# Patient Record
Sex: Male | Born: 1960 | Race: White | Hispanic: No | Marital: Single | Smoking: Never smoker
Health system: Southern US, Community
[De-identification: ages and names within clinical notes are randomized; demographics above are authoritative.]

## PROBLEM LIST (undated history)

## (undated) DIAGNOSIS — I10 Essential (primary) hypertension: Secondary | ICD-10-CM

## (undated) DIAGNOSIS — Z0389 Encounter for observation for other suspected diseases and conditions ruled out: Secondary | ICD-10-CM

## (undated) DIAGNOSIS — E785 Hyperlipidemia, unspecified: Secondary | ICD-10-CM

## (undated) DIAGNOSIS — I340 Nonrheumatic mitral (valve) insufficiency: Secondary | ICD-10-CM

## (undated) HISTORY — DX: Hyperlipidemia, unspecified: E78.5

## (undated) HISTORY — DX: Nonrheumatic mitral (valve) insufficiency: I34.0

## (undated) HISTORY — DX: Encounter for observation for other suspected diseases and conditions ruled out: Z03.89

## (undated) HISTORY — DX: Essential (primary) hypertension: I10

---
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
## 1998-11-05 ENCOUNTER — Emergency Department (HOSPITAL_COMMUNITY): Admission: EM | Admit: 1998-11-05 | Discharge: 1998-11-05 | Payer: Self-pay | Admitting: Emergency Medicine

## 1998-11-05 ENCOUNTER — Encounter: Payer: Self-pay | Admitting: Emergency Medicine

## 2001-08-30 ENCOUNTER — Emergency Department (HOSPITAL_COMMUNITY): Admission: EM | Admit: 2001-08-30 | Discharge: 2001-08-30 | Payer: Self-pay

## 2007-10-04 HISTORY — PX: HERNIA REPAIR: SHX51

## 2008-02-28 ENCOUNTER — Ambulatory Visit (HOSPITAL_COMMUNITY): Admission: RE | Admit: 2008-02-28 | Discharge: 2008-02-28 | Payer: Self-pay | Admitting: General Surgery

## 2008-02-28 ENCOUNTER — Encounter (INDEPENDENT_AMBULATORY_CARE_PROVIDER_SITE_OTHER): Payer: Self-pay | Admitting: General Surgery

## 2008-09-04 ENCOUNTER — Ambulatory Visit (HOSPITAL_BASED_OUTPATIENT_CLINIC_OR_DEPARTMENT_OTHER): Admission: RE | Admit: 2008-09-04 | Discharge: 2008-09-04 | Payer: Self-pay | Admitting: Family Medicine

## 2008-09-07 ENCOUNTER — Ambulatory Visit: Payer: Self-pay | Admitting: Internal Medicine

## 2009-04-19 IMAGING — CR DG CHEST 2V
2 series · 2 of 2 positions shown · non-contrast
Comparison: None

CLINICAL DATA: Preoperative chest x-ray for right inguinal hernia
repair

CHEST - 2 VIEW

[w chest pa]
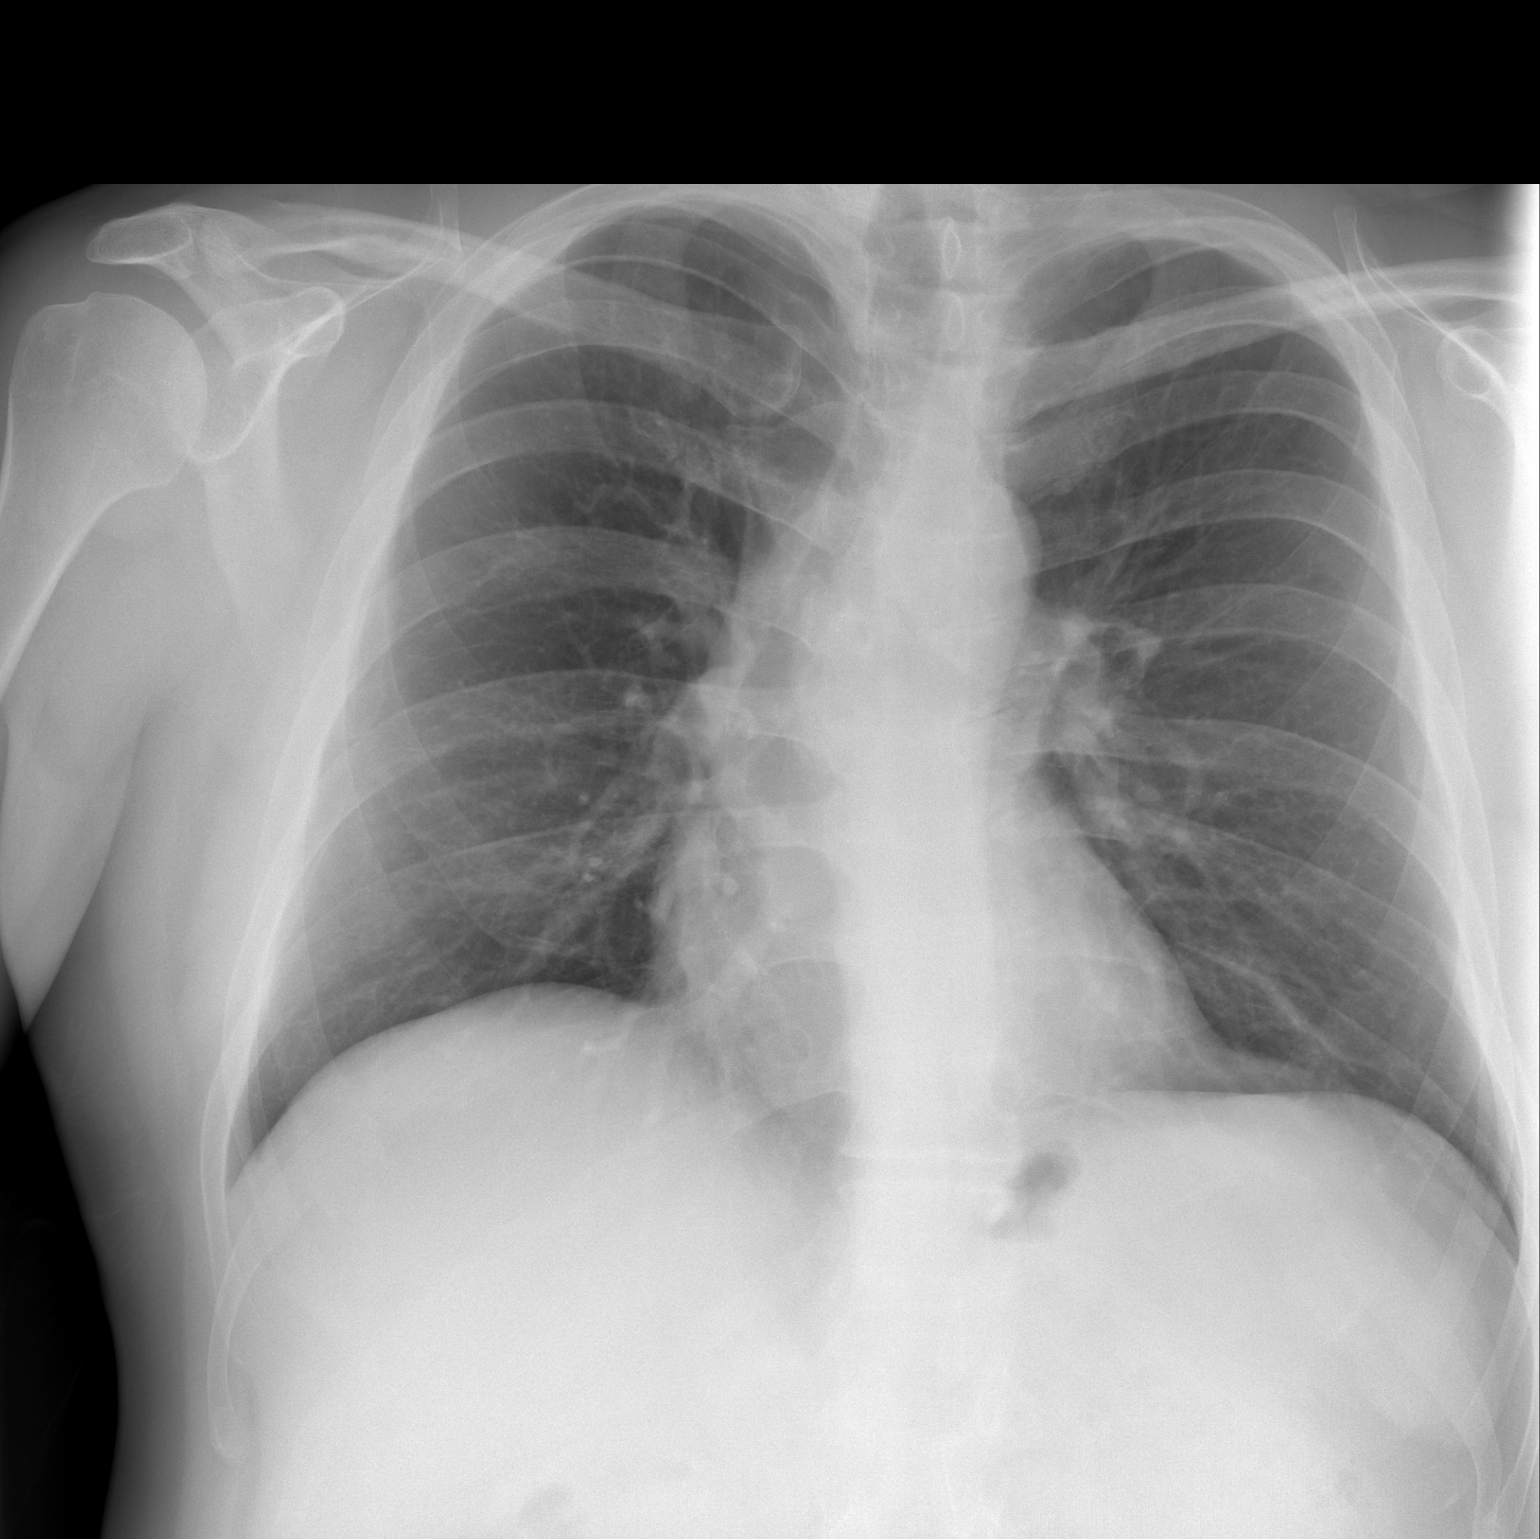

[w chest lat]
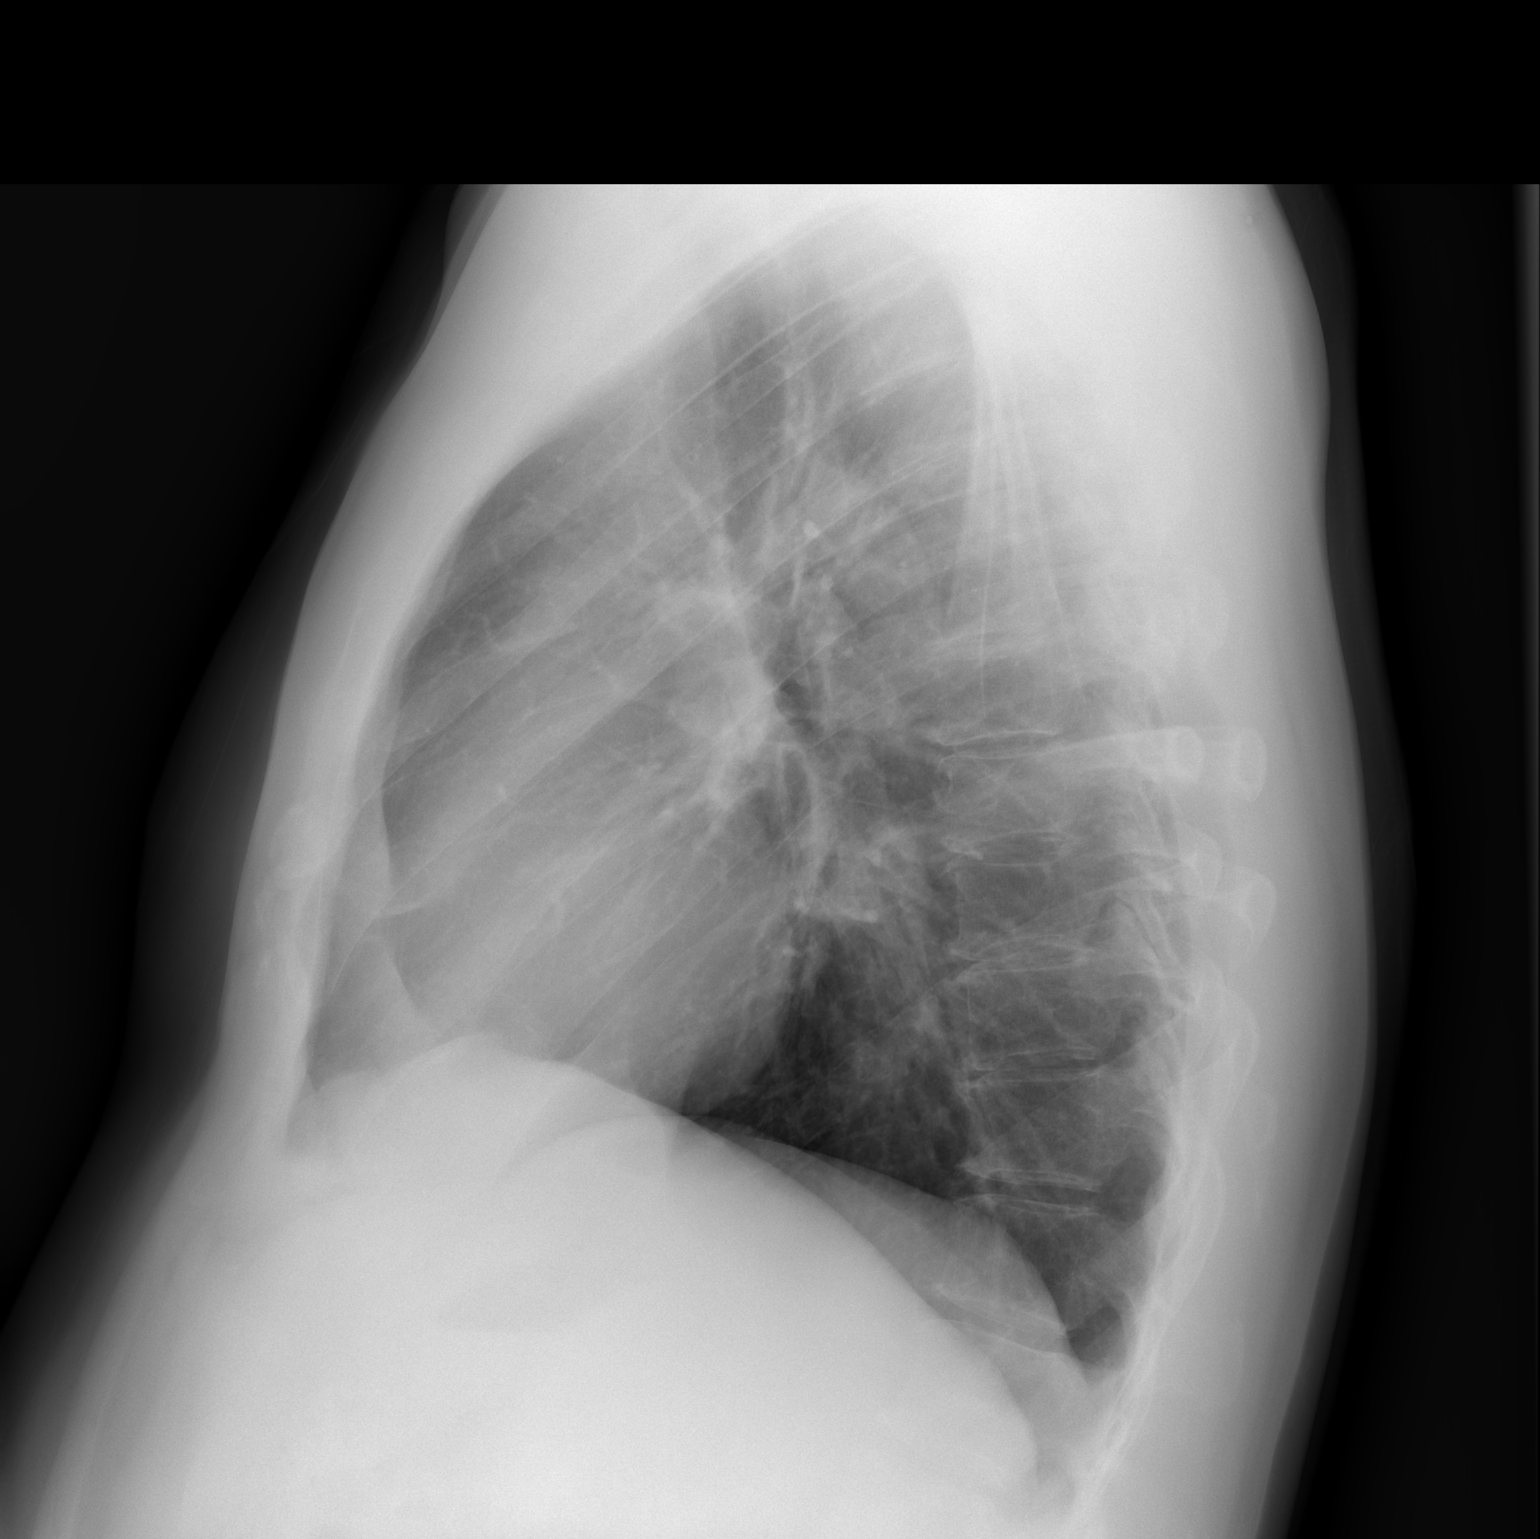

[2 of 2 positions shown; findings below may reference images not displayed]

FINDINGS: The cardiac silhouette, mediastinal and hilar contours
are within normal limits.  The lungs are clear.  There is a right
convex scoliotic curvature of the thoracic spine.  The bony thorax
is otherwise unremarkable.
IMPRESSION: 1.  No acute cardiopulmonary findings.

## 2011-02-15 NOTE — Procedures (Signed)
NAME:  Danny Norris, Danny Norris NO.:  [id number]   MEDICAL RECORD NO.:  [id number]          PATIENT TYPE:  OUT   LOCATION:  SLEEP CENTER                 FACILITY:  [facility]   PHYSICIAN:  Clinton D. Maple Hudson, MD, FCCP, FACPDATE OF BIRTH:  03/18/1961   DATE OF STUDY:  09/04/2008                            NOCTURNAL POLYSOMNOGRAM   REFERRING PHYSICIAN:   REFERRING PHYSICIAN:  Paulino Rily   INDICATION FOR STUDY:  Hypersomnia with sleep apnea.   EPWORTH SLEEPINESS SCORE:  Epworth sleepiness score 15/24. BMI 30.  Weight 230 pounds. Height 73 inches. Neck 16.5 inches.   HOME MEDICATIONS:  Charted and reviewed.   SLEEP ARCHITECTURE:  Split-study protocol.  During the diagnostic phase  total sleep time was 122 minutes with sleep efficiency 84%.  Stage I was  6.6%.  Stage II 74.6%. Stage III absent.  REM 18.9% of total sleep time.  Sleep latency is 14.5 minutes. REM latency 107 minutes. Awake after  sleep onset 8 minutes. Arousal index 41.3 indicating increased EEG  arousal.  No bedtime medication was taken.   RESPIRATORY DATA:  Split-study protocol.  Apnea-hypopnea index (AHI)  50.7 per hour. A total of 103 events were scored, including 75  obstructive apneas, 6 mixed apneas, and 22 hypopneas.  Events were  significantly more common while supine. REM AHI 10.4.  CPAP was titrated  to a 11 CWP, AHI 4.7 per hour.  He chose a large Mirage Quattro mask  with heated humidifier.   OXYGEN DATA:  Loud snoring before CPAP, was completely suppressed after  CPAP.  Oxygen desaturation before CPAP to a nadir of 69%.  Mean oxygen  saturation after CPAP control was 92.3% on room air.   CARDIAC DATA:  Sinus tachycardia with sustained self-limited intervals  of supraventricular tachycardia up to 160 per minute.   MOVEMENT/PARASOMNIA:  No significant movement disturbance.  Bathroom x1.   IMPRESSIONS/RECOMMENDATIONS:  1. Moderately severe obstructive sleep apnea/hypopnea syndrome, AHI  50.7 per hour.  Events were more common in supine sleep position.      Loud snoring with oxygen desaturation to a nadir of 69%.  2. Successful continuous positive airway pressure titration to 11      centimeters of water pressure, apnea-hypopnea index 4.7 per hour.      He chose a large Arboriculturist full-face mask with heated      humidifier.  3. Intervals of supraventricular tachycardia with heart rate up to 160      were recorded during the first half of the night.  This was not      noted after continuous positive airway pressure control.  This does      not represent a long enough observation period to be certain that      continuous positive airway pressure was suppressing these events.      Consider Holter monitor or similar technology for evaluation of      heart rhythm after continuous positive airway pressure is      established at home, Cardiology assessment or other evaluation      treatment and appropriate.      Clinton D. Young,  MD, FCCP, FACP  Diplomate, American Board of Sleep Medicine  Electronically Signed     CDY/MEDQ  D:  09/07/2008 15:27:44  T:  09/08/2008 01:36:27  Job:  [id number]

## 2011-02-15 NOTE — Op Note (Signed)
NAME:  Danny Norris, STROLE NO.:  [id number]   MEDICAL RECORD NO.:  [id number]          PATIENT TYPE:  AMB   LOCATION:  DAY                          FACILITY:  [facility]   PHYSICIAN:  Angelia Mould. Derrell Lolling, M.D.DATE OF BIRTH:  Oct 09, 1960   DATE OF PROCEDURE:  02/28/2008  DATE OF DISCHARGE:                               OPERATIVE REPORT   PREOPERATIVE DIAGNOSIS:  Incarcerated right inguinal hernia.   POSTOPERATIVE DIAGNOSIS:  Incarcerated right inguinal hernia.   OPERATION PERFORMED:  Repair, giant incarcerated right inguinal hernia,  with mesh.   SURGEON:  Angelia Mould. Derrell Lolling, M.D.   OPERATIVE INDICATIONS:  This is a 50 year old white man who has had a  right inguinal hernia for some time.  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  For 5-6 months he has noticed a  bulge in his right groin and it has gotten very large.  He states it  will often go away at night.  There has been no abdominal pain or  obstruction.  On exam he has a giant right inguinal hernia that extends  down to and fills the scrotum.  This is extremely difficult to reduce.  There is no evidence of bowel obstruction or inflammatory change.  He is  brought to the operating room for repair.   OPERATIVE FINDINGS:  The patient had a giant indirect right inguinal  hernia containing colon, possibly the sigmoid colon.  There were some  adhesions of the colon to the sac but these were able to be taken down  under direct vision and the colon was able to be reduced completely back  into the abdominal cavity.   OPERATIVE TECHNIQUE:  Following the induction of general endotracheal  anesthesia, the patient's lower abdomen, right groin and genitalia and  upper thigh were completely prepped and draped in a sterile fashion.  Intravenous antibiotics were given.  The patient was identified as the  correct patient, correct procedure and correct site.  Marcaine 0.5% with  epinephrine was used as  a local infiltration anesthetic.  I spent some  time trying to reduce the hernia, perhaps 1 or 2 minutes, and I slowly  could reduce for the most part, getting all of the intestinal contents  out of the scrotum.  I then made a transverse oblique incision overlying  the right inguinal canal.  Dissection was carried down through  subcutaneous tissue.  I exposed the aponeurosis of the external oblique.  I dissected medially and exposed the external inguinal ring.  Self-  retaining retractors were placed.  The external oblique was incised in  the direction of its fibers, opening up the external inguinal ring.  There was lots of chronic scarring requiring fairly involved dissection.  Ultimately I got the external oblique dissected up and away and off the  underlying tissues and put self-retaining retractors in place.  I slowly  dissected around the cord structures at the pubic tubercle and  ultimately got a Penrose drain in place.  This also took some time.  With traction and countertraction I debrided the  cremasteric muscle  fibers away from the cord structures and the indirect hernia sac.  I  identified the indirect hernia sac and then got everything reduced back  into the abdominal cavity.  I dissected the entire indirect sac away  from the surrounding structures and then opened it.  I found that the  sigmoid colon had a couple of adhesions to the sac just above the level  of the internal ring and then I took these adhesions down and returned  the colon to the abdominal cavity.  I made sure that I had dissected the  vas deferens and the testicular vessels completely away from the  indirect sac all the way back to the level of the internal ring.  I then  had the indirect sac completely free.  I then twisted the indirect sac..  I suture ligated the indirect sac at the level of the internal ring with  a suture ligature of 2-0 silk.  I then amputated the sac and sent it for  routine histology.   The wound was irrigated with saline.  We had good  hemostasis.  I debrided further some cremasteric fibers medially to set  up the repair.  The floor of the inguinal canal was bulging a little bit  and so I think he had a small direct hernia component as well.  The  floor of the inguinal canal was repaired and reinforced with a 3 x 6-  inch piece of Ultrapro mesh.  I used almost all of the mesh with just  trimming at the corners to accommodate the wound.  The mesh was sutured  in place with interrupted and running sutures of 2-0 Prolene.  A  mattress suture of 2-0 Prolene was placed medially and to fix the mesh  at the level of the pubic tubercle.  I then took a running suture of 2-0  Prolene and ran the mesh along the inguinal ligament inferiorly.  Medially and superiorly I placed about 8 interrupted mattress sutures of  2-0 Prolene to secure the mesh in place.  Superolaterally I used a  running suture of 2-0 Prolene.  The mesh was incised laterally so as to  wrap around the cord structures at the internal ring.  The tails of the  mesh were overlapped laterally and suture lines completed.  I took 2  more stitches of 2-0 Prolene to tighten up the mesh around the cord  structures.  This provided a very secure repair both medial and lateral  to the internal ring but allowed a fingertip opening for the cord  structures.  There was no bleeding.  The wound was irrigated with  saline.  The external oblique was closed with a running suture of 2-0  Vicryl, placing the cord structures deep to the external oblique.  Scarpa fascia was closed with a running suture of 2-0 Vicryl and the  skin closed with skin staples.  Clean bandages were placed and the  patient taken to the recovery room in stable condition.  Estimated blood  loss was about 25 mL or less.  Complications:  None.  Sponge, needle and  instrument counts were correct.      Angelia Mould. Derrell Lolling, M.D.  Electronically Signed      HMI/MEDQ  D:  02/28/2008  T:  02/28/2008  Job:  [id number]   cc:   Harvie Heck, MD  [facility]   Lyn Records, M.D.  Fax: [phone]

## 2011-04-05 ENCOUNTER — Ambulatory Visit
Admission: RE | Admit: 2011-04-05 | Discharge: 2011-04-05 | Disposition: A | Discharge status: Home / Self Care | Payer: BC Managed Care – PPO | Source: Ambulatory Visit | Attending: Cardiovascular Disease | Admitting: Cardiovascular Disease

## 2011-04-05 ENCOUNTER — Other Ambulatory Visit: Payer: Self-pay | Admitting: Cardiovascular Disease

## 2011-05-05 ENCOUNTER — Ambulatory Visit (HOSPITAL_COMMUNITY)
Admission: RE | Admit: 2011-05-05 | Discharge: 2011-05-05 | Disposition: A | Discharge status: Home / Self Care | Payer: BC Managed Care – PPO | Source: Ambulatory Visit | Attending: Cardiovascular Disease | Admitting: Cardiovascular Disease

## 2011-05-05 DIAGNOSIS — R0602 Shortness of breath: Secondary | ICD-10-CM | POA: Insufficient documentation

## 2011-05-05 DIAGNOSIS — I059 Rheumatic mitral valve disease, unspecified: Secondary | ICD-10-CM | POA: Insufficient documentation

## 2011-06-29 LAB — CBC
HCT: 48.4
Hemoglobin: 16.6
MCHC: 34.4
MCV: 93.3
Platelets: 213
RBC: 5.19
RDW: 13.7
WBC: 5.7

## 2011-06-29 LAB — URINALYSIS, ROUTINE W REFLEX MICROSCOPIC
Nitrite: NEGATIVE
Specific Gravity, Urine: 1.013
Urobilinogen, UA: 0.2

## 2011-06-29 LAB — DIFFERENTIAL
Basophils Absolute: 0
Basophils Relative: 0
Neutro Abs: 4.3
Neutrophils Relative %: 75

## 2011-06-29 LAB — COMPREHENSIVE METABOLIC PANEL
Alkaline Phosphatase: 56
BUN: 8
Chloride: 105
Glucose, Bld: 120 — ABNORMAL HIGH
Potassium: 3.6
Total Bilirubin: 1.1
Total Protein: 6.6

## 2011-06-29 LAB — URINE MICROSCOPIC-ADD ON

## 2011-08-04 DIAGNOSIS — IMO0001 Reserved for inherently not codable concepts without codable children: Secondary | ICD-10-CM

## 2011-08-04 HISTORY — DX: Reserved for inherently not codable concepts without codable children: IMO0001

## 2011-08-22 ENCOUNTER — Encounter (HOSPITAL_COMMUNITY): Payer: Self-pay | Admitting: Pharmacy Technician

## 2011-08-29 ENCOUNTER — Other Ambulatory Visit: Payer: Self-pay | Admitting: Cardiovascular Disease

## 2011-09-01 ENCOUNTER — Encounter (HOSPITAL_COMMUNITY)
Admission: RE | Disposition: A | Discharge status: Home / Self Care | Payer: Self-pay | Source: Ambulatory Visit | Attending: Cardiovascular Disease

## 2011-09-01 ENCOUNTER — Ambulatory Visit (HOSPITAL_COMMUNITY)
Admission: RE | Admit: 2011-09-01 | Discharge: 2011-09-01 | Disposition: A | Discharge status: Home / Self Care | Payer: BC Managed Care – PPO | Source: Ambulatory Visit | Attending: Cardiovascular Disease | Admitting: Cardiovascular Disease

## 2011-09-01 DIAGNOSIS — I059 Rheumatic mitral valve disease, unspecified: Secondary | ICD-10-CM | POA: Insufficient documentation

## 2011-09-01 DIAGNOSIS — I10 Essential (primary) hypertension: Secondary | ICD-10-CM | POA: Insufficient documentation

## 2011-09-01 DIAGNOSIS — E785 Hyperlipidemia, unspecified: Secondary | ICD-10-CM | POA: Insufficient documentation

## 2011-09-01 DIAGNOSIS — G4733 Obstructive sleep apnea (adult) (pediatric): Secondary | ICD-10-CM | POA: Insufficient documentation

## 2011-09-01 HISTORY — PX: LEFT AND RIGHT HEART CATHETERIZATION WITH CORONARY ANGIOGRAM: SHX5449

## 2011-09-01 LAB — POCT I-STAT 3, ART BLOOD GAS (G3+)
Acid-Base Excess: 1 mmol/L (ref 0.0–2.0)
Bicarbonate: 26.5 mEq/L — ABNORMAL HIGH (ref 20.0–24.0)
O2 Saturation: 90 %
TCO2: 28 mmol/L (ref 0–100)
pH, Arterial: 7.369 (ref 7.350–7.450)

## 2011-09-01 LAB — POCT I-STAT 3, VENOUS BLOOD GAS (G3P V)
O2 Saturation: 74 %
pCO2, Ven: 45 mmHg (ref 45.0–50.0)
pH, Ven: 7.353 — ABNORMAL HIGH (ref 7.250–7.300)

## 2011-09-01 SURGERY — LEFT AND RIGHT HEART CATHETERIZATION WITH CORONARY ANGIOGRAM
Anesthesia: LOCAL

## 2011-09-01 MED ORDER — SODIUM CHLORIDE 0.9 % IV SOLN: 250.0000 mL | INTRAVENOUS | Status: DC | PRN

## 2011-09-01 MED ORDER — SODIUM CHLORIDE 0.9 % IJ SOLN: 3.0000 mL | INTRAMUSCULAR | Status: DC | PRN

## 2011-09-01 MED ORDER — ASPIRIN 81 MG PO CHEW
CHEWABLE_TABLET | ORAL | Status: AC
  Administered 2011-09-01: 324 mg via ORAL
  Filled 2011-09-01: qty 4

## 2011-09-01 MED ORDER — SODIUM CHLORIDE 0.9 % IV SOLN: 1.0000 mL/kg/h | INTRAVENOUS | Status: DC

## 2011-09-01 MED ORDER — ASPIRIN 81 MG PO CHEW
324.0000 mg | CHEWABLE_TABLET | ORAL | Status: AC
  Administered 2011-09-01: 324 mg via ORAL

## 2011-09-01 MED ORDER — SODIUM CHLORIDE 0.9 % IJ SOLN: 3.0000 mL | Freq: Two times a day (BID) | INTRAMUSCULAR | Status: DC

## 2011-09-01 MED ORDER — LIDOCAINE HCL (PF) 1 % IJ SOLN
INTRAMUSCULAR | Status: AC
  Filled 2011-09-01: qty 30

## 2011-09-01 MED ORDER — FAMOTIDINE IN NACL 20-0.9 MG/50ML-% IV SOLN
20.0000 mg | Freq: Once | INTRAVENOUS | Status: AC
  Administered 2011-09-01: 20 mg via INTRAVENOUS

## 2011-09-01 MED ORDER — NITROGLYCERIN 0.2 MG/ML ON CALL CATH LAB
INTRAVENOUS | Status: AC
  Filled 2011-09-01: qty 1

## 2011-09-01 MED ORDER — DIPHENHYDRAMINE HCL 50 MG PO CAPS
50.0000 mg | ORAL_CAPSULE | Freq: Once | ORAL | Status: AC
  Administered 2011-09-01: 50 mg via ORAL

## 2011-09-01 MED ORDER — HEPARIN (PORCINE) IN NACL 2-0.9 UNIT/ML-% IJ SOLN
INTRAMUSCULAR | Status: AC
  Filled 2011-09-01: qty 2000

## 2011-09-01 MED ORDER — METHYLPREDNISOLONE SODIUM SUCC 125 MG IJ SOLR
125.0000 mg | Freq: Once | INTRAMUSCULAR | Status: AC
  Administered 2011-09-01: 125 mg via INTRAVENOUS

## 2011-09-01 MED ORDER — DIAZEPAM 5 MG PO TABS
5.0000 mg | ORAL_TABLET | ORAL | Status: AC
  Administered 2011-09-01: 5 mg via ORAL

## 2011-09-01 MED ORDER — ONDANSETRON HCL 4 MG/2ML IJ SOLN: 4.0000 mg | Freq: Four times a day (QID) | INTRAMUSCULAR | Status: DC | PRN

## 2011-09-01 MED ORDER — MIDAZOLAM HCL 2 MG/2ML IJ SOLN
INTRAMUSCULAR | Status: AC
  Filled 2011-09-01: qty 2

## 2011-09-01 MED ORDER — DIPHENHYDRAMINE HCL 25 MG PO CAPS
ORAL_CAPSULE | ORAL | Status: AC
  Administered 2011-09-01: 50 mg via ORAL
  Filled 2011-09-01: qty 2

## 2011-09-01 MED ORDER — METHYLPREDNISOLONE SODIUM SUCC 125 MG IJ SOLR
INTRAMUSCULAR | Status: AC
  Administered 2011-09-01: 125 mg via INTRAVENOUS
  Filled 2011-09-01: qty 2

## 2011-09-01 MED ORDER — ACETAMINOPHEN 325 MG PO TABS: 650.0000 mg | ORAL_TABLET | ORAL | Status: DC | PRN

## 2011-09-01 MED ORDER — SODIUM CHLORIDE 0.9 % IV SOLN
INTRAVENOUS | Status: DC
  Administered 2011-09-01: 1000 mL via INTRAVENOUS

## 2011-09-01 MED ORDER — DIAZEPAM 5 MG PO TABS
ORAL_TABLET | ORAL | Status: AC
  Administered 2011-09-01: 5 mg via ORAL
  Filled 2011-09-01: qty 1

## 2011-09-01 MED ORDER — FENTANYL CITRATE 0.05 MG/ML IJ SOLN
INTRAMUSCULAR | Status: AC
  Filled 2011-09-01: qty 2

## 2011-09-01 MED ORDER — FAMOTIDINE IN NACL 20-0.9 MG/50ML-% IV SOLN
INTRAVENOUS | Status: AC
  Administered 2011-09-01: 20 mg via INTRAVENOUS
  Filled 2011-09-01: qty 50

## 2011-09-01 NOTE — H&P (Signed)
  Updated H & P:   See complete dictated note from office from Nov. 14, 2012.  No change in symptoms or physical findings.  Labs reviewed.  For R and L heart cath to assess coronary anatomy and MR severity.  Patient seen and examined. Agree with assessment and plan.   Lennette Bihari, MD, [facility] 09/01/2011 9:39 AM

## 2011-09-01 NOTE — Brief Op Note (Signed)
09/01/2011  Right and Left Heart Cardiac Catherization:  Danny Norris, 50 y.o., male  Nl coronaries and LV FXN. MVP/MR  DICTATION # [id number], [id number]  Plan Home later today.   Lennette Bihari, MD, [facility] 09/01/2011 11:03 AM

## 2011-09-01 NOTE — Cardiovascular Report (Signed)
NAME:  Danny, Norris NO.:  [id number]  MEDICAL RECORD NO.:  [id number]  LOCATION:  [facility]                         FACILITY:  [facility]  PHYSICIAN:  Nicki Guadalajara, M.D.     DATE OF BIRTH:  [birth year]  DATE OF PROCEDURE: DATE OF DISCHARGE:                           CARDIAC CATHETERIZATION   PROCEDURE:  Right and left heart catheterization:  Swan-Ganz catheterization with cardiac output determinations via thermodilution and Fick method; cine coronary angiography; biplane cine left ventriculography.  INDICATIONS:  Danny Norris is a 50 year old gentleman who has a history of SVT treated with beta-blocker therapy.  He has a history of hyperlipidemia, hypertension, as well as obstructive sleep apnea.  He has been felt to have mitral valve prolapse.  An echo Doppler study early this year showed progressive mitral regurgitation.  He had normal ejection fraction.  Subsequent transesophageal echo revealed severe holosystolic prolapse involving both the anterior leaflet and minimal lateral scallop of the posterior leaflet.  The posterior P2 scallop was the most severely involved.  There was no evidence definitive for a chordal rupture.  He did have mid to late systolic flow reversal in both the left and right upper pulmonary veins.  His left atrium was dilated. The patient is now referred for definitive right and left heart catheterizations.  PROCEDURE IN DETAIL:  After premedication with Versed 2 mg plus fentanyl 25 mcg, the patient was prepped and draped in usual fashion.  His right femoral artery was punctured anteriorly and a 5-French sheath was inserted.  Right femoral vein was punctured anteriorly and a 7-French sheath was inserted.  Swan-Ganz catheterization was done and the catheter was advanced to the RA, RV, PA, and PC positions on hemodynamic monitoring.  O2 saturation was obtained in the pulmonary artery. Pigtail catheter was advanced to the central aorta.  O2  saturation of the central aorta was obtained.  Simultaneous PA and AO pressures were recorded.  Cardiac output was done also via the thermodilution method. The pigtail catheter was advanced into the LV.  Simultaneous LV and PC pressures were recorded.  A right heart pullback was performed.  Biplane cine left ventriculography was done in the RAO and LAO projections.  An LVA pullback was performed.  Attention was then directed at the coronary arteries.  Diagnostic 5-French left and right coronary catheters were used for angiography.  The patient tolerated the procedure well.  He will be discharged later today from the same day catheterization suite.  HEMODYNAMIC DATA:  Right atrial pressure mean of 6 with A-wave 11, V- wave 10, right ventricular pressure 27/7, PA pressure 28/12.  Mean pulmonary capillary wedge of 15, V-wave 17.  Central aortic pressure was 100/73, left ventricle pressure 100/15.  O2 saturation pulmonary artery was 74%.  The central aorta was 90%.  Cardiac output by the thermodilution method was 7.0, by the Fick method was 8.7 L/minute.  Cardiac index was 3.1 and 3.9 L/minute per meter squared.  Biplane cine left ventriculography revealed normal LV contractility with an ejection fraction of at least 55-60%.  On the RAO projection, there was hardly any mitral regurgitation appreciated.  On the LAO projection, a faint dye load was  seen advancing to the superior aspect of the left atrial wall concordant with the TEE findings, although this did not appear to be a large extensive jet of mitral regurgitation.  ANGIOGRAPHIC DATA:  Left main coronary artery was angiographically normal and bifurcated into an LAD and left circumflex system.  The LAD was angiographically normal.  It gave rise to 2 major diagonal vessels.  The circumflex vessel was angiographically normal.  It gave rise to 2 marginal vessels.  The right coronary artery was angiographically normal, gave rise  to the PDA and posterolateral system.  IMPRESSION: 1. Normal left ventricular function. 2. Angiographic mitral valve prolapse with mitral regurgitation     extending to the superior atrial wall on the LAO projection. 3. Normal pulmonary pressures without evidence for pulmonary     hypertension and with absence of significant V wave noted on     pulmonary capillary wedge tracing. 4. Normal coronary arteries.          ______________________________ Nicki Guadalajara, M.D.     TK/MEDQ  D:  09/01/2011  T:  09/01/2011  Job:  [id number]  cc:   Nicki Guadalajara, M.D. Knox Royalty, MD

## 2011-11-03 MED ORDER — SODIUM CHLORIDE 0.9 % IJ SOLN: 3.0000 mL | INTRAMUSCULAR | Status: DC | PRN

## 2011-11-03 MED ORDER — SODIUM CHLORIDE 0.9 % IJ SOLN: 3.0000 mL | Freq: Two times a day (BID) | INTRAMUSCULAR | Status: DC

## 2011-11-03 MED ORDER — SODIUM CHLORIDE 0.9 % IV SOLN: 250.0000 mL | INTRAVENOUS | Status: DC | PRN

## 2012-08-16 ENCOUNTER — Other Ambulatory Visit (HOSPITAL_COMMUNITY): Payer: Self-pay

## 2012-08-16 DIAGNOSIS — I2510 Atherosclerotic heart disease of native coronary artery without angina pectoris: Secondary | ICD-10-CM

## 2012-09-06 ENCOUNTER — Other Ambulatory Visit (HOSPITAL_COMMUNITY): Payer: Self-pay | Admitting: Vascular Surgery

## 2012-09-06 DIAGNOSIS — I059 Rheumatic mitral valve disease, unspecified: Secondary | ICD-10-CM

## 2012-09-07 ENCOUNTER — Ambulatory Visit (HOSPITAL_COMMUNITY)
Admission: RE | Admit: 2012-09-07 | Discharge: 2012-09-07 | Disposition: A | Discharge status: Home / Self Care | Payer: BC Managed Care – PPO | Source: Ambulatory Visit | Attending: Cardiovascular Disease | Admitting: Cardiovascular Disease

## 2012-09-07 DIAGNOSIS — I059 Rheumatic mitral valve disease, unspecified: Secondary | ICD-10-CM | POA: Insufficient documentation

## 2012-09-07 DIAGNOSIS — I10 Essential (primary) hypertension: Secondary | ICD-10-CM | POA: Insufficient documentation

## 2012-09-07 DIAGNOSIS — G4733 Obstructive sleep apnea (adult) (pediatric): Secondary | ICD-10-CM | POA: Insufficient documentation

## 2012-09-07 DIAGNOSIS — I517 Cardiomegaly: Secondary | ICD-10-CM | POA: Insufficient documentation

## 2012-09-07 DIAGNOSIS — E785 Hyperlipidemia, unspecified: Secondary | ICD-10-CM | POA: Insufficient documentation

## 2012-09-07 NOTE — Progress Notes (Signed)
[facility]   2D echo completed 09/07/2012.   Veda Canning, RDCS

## 2012-10-01 ENCOUNTER — Other Ambulatory Visit (HOSPITAL_COMMUNITY): Payer: Self-pay | Admitting: Cardiovascular Disease

## 2012-10-01 DIAGNOSIS — I119 Hypertensive heart disease without heart failure: Secondary | ICD-10-CM

## 2012-10-17 ENCOUNTER — Ambulatory Visit (HOSPITAL_COMMUNITY)
Admission: RE | Admit: 2012-10-17 | Discharge: 2012-10-17 | Disposition: A | Discharge status: Home / Self Care | Payer: BC Managed Care – PPO | Source: Ambulatory Visit | Attending: Cardiovascular Disease | Admitting: Cardiovascular Disease

## 2012-10-17 DIAGNOSIS — R42 Dizziness and giddiness: Secondary | ICD-10-CM | POA: Insufficient documentation

## 2012-10-17 DIAGNOSIS — I10 Essential (primary) hypertension: Secondary | ICD-10-CM | POA: Insufficient documentation

## 2012-10-17 DIAGNOSIS — I119 Hypertensive heart disease without heart failure: Secondary | ICD-10-CM

## 2012-10-17 MED ORDER — TECHNETIUM TC 99M SESTAMIBI GENERIC - CARDIOLITE
10.1000 | Freq: Once | INTRAVENOUS | Status: AC | PRN
  Administered 2012-10-17: 10.1 via INTRAVENOUS

## 2012-10-17 MED ORDER — TECHNETIUM TC 99M SESTAMIBI GENERIC - CARDIOLITE
30.5000 | Freq: Once | INTRAVENOUS | Status: AC | PRN
  Administered 2012-10-17: 31 via INTRAVENOUS

## 2012-10-17 NOTE — Procedures (Addendum)
 [facility] [address] [place] [address] [place] [address] [phone]  Cardiology Nuclear Med Study  DARUS HERSHMAN is a 52 y.o. male     MRN : [id number]     DOB: 21-Jan-1961  Procedure Date: 10/17/2012  Nuclear Med Background Indication for Stress Test:  Evaluation for Ischemia History:  MVP Cardiac Risk Factors: Hypertension, Lipids and Obesity  Symptoms:  Dizziness   Nuclear Pre-Procedure Caffeine/Decaff Intake:  7:00pm NPO After: 5:00am   IV Site: R Antecubital  IV 0.9% NS with Angio Cath:  22g  Chest Size (in):  44 IV Started by: Koren Shiver, CNMT  Height: 6' (1.829 m)  Cup Size: n/a  BMI:  Body mass index is 30.24 kg/(m^2). Weight:  223 lb (101.152 kg)   Tech Comments:  n/a    Nuclear Med Study 1 or 2 day study: 1 day  Stress Test Type:  Stress  Order Authorizing Provider:  Nicki Guadalajara, MD   Resting Radionuclide: Technetium 60m Sestamibi  Resting Radionuclide Dose: 10.1 mCi   Stress Radionuclide:  Technetium 24m Sestamibi  Stress Radionuclide Dose: 30.5 mCi           Stress Protocol Rest HR: 80 Stress HR: 153  Rest BP: 145/87 Stress BP: 198/86  Exercise Time (min): 7:00 METS: 7.0   Predicted Max HR: 169 bpm % Max HR: 90.53 bpm Rate Pressure Product: 46962   Dose of Adenosine (mg):  n/a Dose of Lexiscan: n/a mg  Dose of Atropine (mg): N/A Dose of Dobutamine: n/a mcg/kg/min (at max HR)  Stress Test Technologist: Esperanza Sheets, CCT Nuclear Technologist: Koren Shiver, CNMT   Rest Procedure:  Myocardial perfusion imaging was performed at rest 45 minutes following the intravenous administration of Technetium 9m Sestamibi. Stress Procedure:  The patient performed treadmill exercise using a Bruce  Protocol for 7:00 minutes. The patient stopped due to SOB and denied any chest pain.  There were no significant ST-T wave changes.  Technetium 75m Sestamibi was injected at peak exercise and myocardial perfusion imaging  was performed after a brief delay.  Transient Ischemic Dilatation (Normal <1.22):  0.93 Lung/Heart Ratio (Normal <0.45):  0.29 QGS EDV:  144 ml QGS ESV:  46 ml LV Ejection Fraction: 68%  Signed by.     Rest ECG: NSR - Normal EKG  Stress ECG: No significant change from baseline ECG  QPS Raw Data Images:  Normal; no motion artifact; normal heart/lung ratio. Stress Images:  Mild decreased uptake inferolaterally as well as inferobasally Rest Images:  Matched decrease in activity at inferior base with improved tracer uptake inferolaterally Subtraction (SDS):  Mild inferolateral wall ischemia as well as inferobasal scar  Impression Exercise Capacity:  Good exercise capacity. BP Response:  Normal blood pressure response. Clinical Symptoms:  No significant symptoms noted. ECG Impression:  No significant ST segment change suggestive of ischemia. Comparison with Prior Nuclear Study: No images to compare  Overall Impression:  Low risk stress nuclear study.  LV Wall Motion:  NL LV Function; NL Wall Motion  Mild inferolateral wall ischemia and inferobasal scar. Follow up with Dr. Bonnielee Haff, MD  10/17/2012 4:33 PM

## 2012-12-14 ENCOUNTER — Encounter: Payer: Self-pay | Admitting: Cardiology

## 2012-12-25 ENCOUNTER — Encounter: Payer: Self-pay | Admitting: Cardiovascular Disease

## 2013-03-21 ENCOUNTER — Other Ambulatory Visit: Payer: Self-pay | Admitting: *Deleted

## 2013-03-21 MED ORDER — HYDROCHLOROTHIAZIDE 25 MG PO TABS: 25.0000 mg | ORAL_TABLET | Freq: Every day | ORAL | Status: DC

## 2013-05-02 ENCOUNTER — Encounter: Payer: Self-pay | Admitting: Cardiovascular Disease

## 2013-05-02 ENCOUNTER — Ambulatory Visit (INDEPENDENT_AMBULATORY_CARE_PROVIDER_SITE_OTHER): Payer: BC Managed Care – PPO | Admitting: Cardiovascular Disease

## 2013-05-02 VITALS — BP 110/78 | HR 52 | Ht 73.0 in | Wt 226.0 lb

## 2013-05-02 DIAGNOSIS — E785 Hyperlipidemia, unspecified: Secondary | ICD-10-CM | POA: Insufficient documentation

## 2013-05-02 DIAGNOSIS — G4733 Obstructive sleep apnea (adult) (pediatric): Secondary | ICD-10-CM

## 2013-05-02 DIAGNOSIS — I10 Essential (primary) hypertension: Secondary | ICD-10-CM | POA: Insufficient documentation

## 2013-05-02 DIAGNOSIS — I340 Nonrheumatic mitral (valve) insufficiency: Secondary | ICD-10-CM | POA: Insufficient documentation

## 2013-05-02 DIAGNOSIS — E782 Mixed hyperlipidemia: Secondary | ICD-10-CM

## 2013-05-02 DIAGNOSIS — I341 Nonrheumatic mitral (valve) prolapse: Secondary | ICD-10-CM

## 2013-05-02 DIAGNOSIS — I059 Rheumatic mitral valve disease, unspecified: Secondary | ICD-10-CM

## 2013-05-02 NOTE — Addendum Note (Signed)
Addended by: Barrie Dunker on: 05/02/2013 03:52 PM   Modules accepted: Orders

## 2013-05-02 NOTE — Progress Notes (Signed)
Patient ID: Danny Norris, male   DOB: 24-Jan-1961, 52 y.o.   MRN: [id number]     HPI: Danny Norris, is a 52 y.o. male who presents for a six-month cardiology followup evaluation. Ms. Danny Norris has a history of moderately severe to severe mitral regurgitation. He did undergo a TEE evaluation in August 2012 which showed normal LV function. He has significant mitral valve prolapse especially the P2 segment without clear evidence for ruptured chordae and had eccentric mitral regurgitation anteriorly directed. There was mild dilatation of the sinus Valsalva at 44 mm. He underwent cardiac catheterization in November 2012 which showed normal coronary arteries. He did have mitral valve prolapse with 2+ MR and a normal pulmonary pressures. Poor capillary wedge pressure at that time was 15. PA pressure 28/12.  His last echo Doppler study in December 2013 revealed moderate holosystolic prolapse with moderate to severe MR directed eccentrically anteriorly. His LA was mildly dilated posterior pressure was normal at 22 mm Hg. Over the past 6 months, Danny Norris denies any significant change in symptoms he does have a history of hypertension, obstructive sleep apnea diagnosed in 2009 by Dr. Maple Hudson, as well as hyperlipidemia.  Past Medical History  Diagnosis Date  . Mitral valve regurgitation     MVP, mod- severe MR 2D 12/13  . Normal coronary arteries 11/12  . HTN (hypertension)   . Dyslipidemia     Past Surgical History  Procedure Laterality Date  . Hernia repair Right 2009    Rt inguinal repair  . Eye surgery  1975    Allergies  Allergen Reactions  . Contrast Media (Iodinated Diagnostic Agents) Rash    Current Outpatient Prescriptions  Medication Sig Dispense Refill  . amLODipine-benazepril (LOTREL) 10-40 MG per capsule Take 1 capsule by mouth daily.        Marland Kitchen aspirin 81 MG chewable tablet Chew 81 mg by mouth daily.      . Coenzyme Q10 (COQ10) 200 MG CAPS Take by mouth daily.      .  hydrochlorothiazide (HYDRODIURIL) 25 MG tablet Take 1 tablet (25 mg total) by mouth daily.  30 tablet  11  . metoprolol (TOPROL-XL) 50 MG 24 hr tablet Take 50 mg by mouth daily.        . rosuvastatin (CRESTOR) 10 MG tablet Take 10 mg by mouth daily.       No current facility-administered medications for this visit.    Socially he is single. There are no children. He does exercise one to 2 days per week. There is no tobacco or alcohol use.  ROS is negative for fevers, chills or night sweats. He denies visual symptoms. He denies PND or orthopnea. He does have some difficulty falling asleep. He denies recent chest pain. He denies wheezing. He denies shortness of breath. He denies bleeding. He denies abdominal discomfort. He denies significant edema. He denies restless legs. He denies cramps per  Other system review is negative.  PE BP 110/78  Pulse 52  Ht 6\' 1"  (1.854 m)  Wt 226 lb (102.513 kg)  BMI 29.82 kg/m2  General: Alert, oriented, no distress.  Skin: normal turgor, no rashes HEENT: Normocephalic, atraumatic. Pupils round and reactive; sclera anicteric;no lid lag.  Nose without nasal septal hypertrophy Mouth/Parynx benign; Mallinpatti scale 3 Neck: No JVD, no carotid briuts Lungs: clear to ausculatation and percussion; no wheezing or rales Heart: RRR, s1 s2 normal; systolic click with a least 2/6 systolic murmur at apex concordant with his moderately severe mitral  regurgitation. Abdomen: soft, nontender; no hepatosplenomehaly, BS+; abdominal aorta nontender and not dilated by palpation. Pulses 2+ Extremities: no clubbing cyanosis or edema, Homan's sign negative  Neurologic: grossly nonfocal  ECG: Sinus bradycardia 52 beats per minute. Previously noted T wave abnormality inferior laterally.  LABS:  BMET    Component Value Date/Time   NA 140 02/26/2008 1010   K 3.6 02/26/2008 1010   CL 105 02/26/2008 1010   CO2 27 02/26/2008 1010   GLUCOSE 120* 02/26/2008 1010   BUN 8 02/26/2008  1010   CREATININE 0.93 02/26/2008 1010   CALCIUM 9.9 02/26/2008 1010   GFRNONAA >60 02/26/2008 1010   GFRAA  Value: >60        The eGFR has been calculated using the MDRD equation. This calculation has not been validated in all clinical 02/26/2008 1010     Hepatic Function Panel     Component Value Date/Time   PROT 6.6 02/26/2008 1010   ALBUMIN 4.1 02/26/2008 1010   AST 26 02/26/2008 1010   ALT 26 02/26/2008 1010   ALKPHOS 56 02/26/2008 1010   BILITOT 1.1 02/26/2008 1010     CBC    Component Value Date/Time   WBC 5.7 02/26/2008 1010   RBC 5.19 02/26/2008 1010   HGB 16.6 02/26/2008 1010   HCT 48.4 02/26/2008 1010   PLT 213 02/26/2008 1010   MCV 93.3 02/26/2008 1010   MCHC 34.4 02/26/2008 1010   RDW 13.7 02/26/2008 1010   LYMPHSABS 1.1 02/26/2008 1010   MONOABS 0.3 02/26/2008 1010   EOSABS 0.1 02/26/2008 1010   BASOSABS 0.0 02/26/2008 1010     BNP No results found for this basename: probnp    Lipid Panel  No results found for this basename: chol, trig, hdl, cholhdl, vldl, ldlcalc     RADIOLOGY: No results found.    ASSESSMENT AND PLAN: [name] has documented moderate holosystolic mitral valve prolapse involving predominantly his P2 component. He has eccentrically directed moderately severe mitral regurgitation. Pulmonary pressures have been normal in the past. His last echo Doppler study in December 2013 continued to show normal pulmonary pressures. He does have T wave abnormalities. He did have normal coronary arteries at cardiac catheterization November 2012. He denies any shortness of breath. A nuclear study did raise the possibility of basal inferior ischemia but there also was diaphragmatic attenuation. His blood pressure is well controlled on Lotrel 10/40, metoprolol and HCTZ. In January 2015 I am recommending a followup echo Doppler study to reassess his mitral valve, chamber dimensions, and pulmonary pressures. I'll see him back after the study for followup  evaluation.     Lennette Bihari, MD, [facility]  05/02/2013 3:36 PM

## 2013-05-02 NOTE — Patient Instructions (Signed)
Your physician recommends that you schedule a follow-up appointment in: 6 months  Your physician has requested that you have an echocardiogram. Echocardiography is a painless test that uses sound waves to create images of your heart. It provides your doctor with information about the size and shape of your heart and how well your heart's chambers and valves are working. This procedure takes approximately one hour. There are no restrictions for this procedure. After Echo

## 2013-05-20 ENCOUNTER — Other Ambulatory Visit: Payer: Self-pay | Admitting: Cardiovascular Disease

## 2013-05-20 NOTE — Telephone Encounter (Signed)
Rx was sent to pharmacy electronically. 

## 2013-06-22 ENCOUNTER — Other Ambulatory Visit: Payer: Self-pay | Admitting: Cardiovascular Disease

## 2013-06-24 NOTE — Telephone Encounter (Signed)
Rx was sent to pharmacy electronically. 

## 2013-08-27 ENCOUNTER — Telehealth (HOSPITAL_COMMUNITY): Payer: Self-pay | Admitting: *Deleted

## 2013-10-23 ENCOUNTER — Ambulatory Visit (HOSPITAL_COMMUNITY)
Admission: RE | Admit: 2013-10-23 | Discharge: 2013-10-23 | Disposition: A | Discharge status: Home / Self Care | Payer: BC Managed Care – PPO | Source: Ambulatory Visit | Attending: Cardiovascular Disease | Admitting: Cardiovascular Disease

## 2013-10-23 ENCOUNTER — Other Ambulatory Visit: Payer: Self-pay | Admitting: *Deleted

## 2013-10-23 DIAGNOSIS — G4730 Sleep apnea, unspecified: Secondary | ICD-10-CM | POA: Insufficient documentation

## 2013-10-23 DIAGNOSIS — I340 Nonrheumatic mitral (valve) insufficiency: Secondary | ICD-10-CM

## 2013-10-23 DIAGNOSIS — I079 Rheumatic tricuspid valve disease, unspecified: Secondary | ICD-10-CM | POA: Insufficient documentation

## 2013-10-23 DIAGNOSIS — I059 Rheumatic mitral valve disease, unspecified: Secondary | ICD-10-CM

## 2013-10-23 DIAGNOSIS — I10 Essential (primary) hypertension: Secondary | ICD-10-CM | POA: Insufficient documentation

## 2013-10-23 DIAGNOSIS — I341 Nonrheumatic mitral (valve) prolapse: Secondary | ICD-10-CM

## 2013-10-23 DIAGNOSIS — E785 Hyperlipidemia, unspecified: Secondary | ICD-10-CM | POA: Insufficient documentation

## 2013-10-23 MED ORDER — ROSUVASTATIN CALCIUM 10 MG PO TABS: 10.0000 mg | ORAL_TABLET | Freq: Every day | ORAL | Status: DC

## 2013-10-23 NOTE — Progress Notes (Signed)
2D Echo Performed 09/02/2014    Refoel Palladino, RCS  

## 2013-11-04 ENCOUNTER — Encounter: Payer: Self-pay | Admitting: Cardiovascular Disease

## 2013-11-04 ENCOUNTER — Ambulatory Visit (INDEPENDENT_AMBULATORY_CARE_PROVIDER_SITE_OTHER): Payer: BC Managed Care – PPO | Admitting: Cardiovascular Disease

## 2013-11-04 VITALS — BP 126/82 | HR 50 | Ht 73.0 in | Wt 229.3 lb

## 2013-11-04 DIAGNOSIS — I341 Nonrheumatic mitral (valve) prolapse: Secondary | ICD-10-CM

## 2013-11-04 DIAGNOSIS — G4733 Obstructive sleep apnea (adult) (pediatric): Secondary | ICD-10-CM

## 2013-11-04 DIAGNOSIS — R5383 Other fatigue: Secondary | ICD-10-CM

## 2013-11-04 DIAGNOSIS — E785 Hyperlipidemia, unspecified: Secondary | ICD-10-CM

## 2013-11-04 DIAGNOSIS — E782 Mixed hyperlipidemia: Secondary | ICD-10-CM

## 2013-11-04 DIAGNOSIS — I10 Essential (primary) hypertension: Secondary | ICD-10-CM

## 2013-11-04 DIAGNOSIS — I340 Nonrheumatic mitral (valve) insufficiency: Secondary | ICD-10-CM

## 2013-11-04 DIAGNOSIS — R5381 Other malaise: Secondary | ICD-10-CM

## 2013-11-04 DIAGNOSIS — Z79899 Other long term (current) drug therapy: Secondary | ICD-10-CM

## 2013-11-04 DIAGNOSIS — I059 Rheumatic mitral valve disease, unspecified: Secondary | ICD-10-CM

## 2013-11-04 MED ORDER — ATORVASTATIN CALCIUM 20 MG PO TABS: 20.0000 mg | ORAL_TABLET | Freq: Every day | ORAL | Status: DC

## 2013-11-04 NOTE — Progress Notes (Signed)
Patient ID: Danny Norris, male   DOB: 1961-05-10, 52 y.o.   MRN: [id number]      HPI: Danny Norris, is a 53 y.o. male who presents for a six-month cardiology followup evaluation.  Ms. Danny Norris has a history of previously documented moderately severe to severe mitral regurgitation. He underwent a is a is a TEE evaluation in August 2012 which showed normal LV function. He has significant mitral valve prolapse especially involving the P2 segment without clear evidence for ruptured chordae and had eccentric mitral regurgitation anteriorly directed. There was mild dilatation of the sinus Valsalva at 44 mm. He underwent cardiac catheterization in November 2012 which showed normal coronary arteries. He did have mitral valve prolapse with 2+ MR and a normal pulmonary pressures. Pulmonary capillary wedge pressure at that time was 15. PA pressure 28/12.  An echo Doppler study in December 2013 revealed moderate holosystolic prolapse with moderate to severe MR directed eccentrically anteriorly. His LA was mildly dilated posterior pressure was normal at 22 mm Hg. Over the past 6 months, Danny Norris denies any significant change in symptoms he does have a history of hypertension, obstructive sleep apnea diagnosed in 2009 by Dr. Annamaria Norris, as well as hyperlipidemia.  On October 23, 2013, Danny Norris underwent a one-year echo Doppler assessment. This showed an ejection fraction of 55-60% without regional wall motion abnormalities and with normal diastolic function. On this echo he was determined to have moderate prolapse involving the posterior leaflet with moderate regurgitation directed eccentrically anteriorly. His left atrium was felt to be mildly dilated. PA pressure was now approximately 31 mm.  Danny Norris denies any recent episodes of chest pain. He denies exertional shortness of breath. He denies change in symptoms. A denies significant leg swelling. He states that his insurance company no longer will allow him to  take Crestor and he needs to change his prescription. He has not had lipid lowering therapy for over 2 weeks. He presents for followup cardiology evaluation.    Past Medical History  Diagnosis Date  . Mitral valve regurgitation     MVP, mod- severe MR 2D 12/13  . Normal coronary arteries 11/12  . HTN (hypertension)   . Dyslipidemia     Past Surgical History  Procedure Laterality Date  . Hernia repair Right 2009    Rt inguinal repair  . Eye surgery  1975    Allergies  Allergen Reactions  . Contrast Media [Iodinated Diagnostic Agents] Rash    Current Outpatient Prescriptions  Medication Sig Dispense Refill  . amLODipine-benazepril (LOTREL) 10-40 MG per capsule TAKE ONE CAPSULE BY MOUTH EVERY DAY  30 capsule  10  . aspirin 81 MG chewable tablet Chew 81 mg by mouth daily.      . Coenzyme Q10 (COQ10) 200 MG CAPS Take by mouth daily.      . hydrochlorothiazide (HYDRODIURIL) 25 MG tablet Take 1 tablet (25 mg total) by mouth daily.  30 tablet  11  . metoprolol succinate (TOPROL-XL) 50 MG 24 hr tablet TAKE 1 TABLET BY MOUTH EVERY DAY  30 tablet  11  . rosuvastatin (CRESTOR) 10 MG tablet Take 1 tablet (10 mg total) by mouth daily.  30 tablet  6   No current facility-administered medications for this visit.    Socially he is single. There are no children. He does exercise one to 2 days per week. There is no tobacco or alcohol use.  ROS is negative for fevers, chills or night sweats. He denies visual symptoms.  He denies change in hearing. He denies lymphadenopathy. He denies wheezing. He denies cough or increased sputum production. He denies PND or orthopnea. He does have some difficulty falling asleep. He denies recent chest pain. He denies wheezing. He denies shortness of breath. He denies bleeding. He denies abdominal discomfort, nausea vomiting or diarrhea. There is no change in stool pattern. He denies blood in stool urine per. He denies significant edema. He denies restless legs. He  denies cramps. There is no diabetes there is no history of hypothyroidism. He does have obstructive sleep apnea although Dr. Annamaria Norris.  He denies significant musculoskeletal symptoms. Other comprehensive 14 point system review is negative.  PE BP 126/82  Pulse 50  Ht _0  (1.854 m)  Wt 229 lb 4.8 oz (104.01 kg)  BMI 30.26 kg/m2  General: Alert, oriented, no distress.  Skin: normal turgor, no rashes HEENT: Normocephalic, atraumatic. Pupils round and reactive; sclera anicteric;no lid lag. No xanthelasma Nose without nasal septal hypertrophy Mouth/Parynx benign; Mallinpatti scale 3 Neck: No JVD, no carotid bruits with normal carotid upstroke Lungs: clear to ausculatation and percussion; no wheezing or rales Chest wall: No tenderness to Heart: RRR, s1 s2 normal; systolic click with a least 2/6 systolic murmur at apex concordant with his  at least moderate mitral regurgitation. Abdomen: soft, nontender; no hepatosplenomehaly, BS+; abdominal aorta nontender and not dilated by palpation. Back: No CVA tenderness Pulses 2+ Extremities: no clubbing cyanosis or edema, Homan's sign negative  Neurologic: grossly nonfocal Psychological: Normal cognition. Normal affect  ECG  (independently read by me): Sinus bradycardia at 50 beats per minute. Nonspecific T. change in lead 3. Normal intervals.  Prior ECG from 05/02/2013: Sinus bradycardia 52 beats per minute. Previously noted T wave abnormality inferior laterally.  LABS:  BMET    Component Value Date/Time   NA 140 02/26/2008 1010   K 3.6 02/26/2008 1010   CL 105 02/26/2008 1010   CO2 27 02/26/2008 1010   GLUCOSE 120* 02/26/2008 1010   BUN 8 02/26/2008 1010   CREATININE 0.93 02/26/2008 1010   CALCIUM 9.9 02/26/2008 1010   GFRNONAA >60 02/26/2008 1010   GFRAA  Value: >60        The eGFR has been calculated using the MDRD equation. This calculation has not been validated in all clinical 02/26/2008 1010     Hepatic Function Panel     Component Value  Date/Time   PROT 6.6 02/26/2008 1010   ALBUMIN 4.1 02/26/2008 1010   AST 26 02/26/2008 1010   ALT 26 02/26/2008 1010   ALKPHOS 56 02/26/2008 1010   BILITOT 1.1 02/26/2008 1010     CBC    Component Value Date/Time   WBC 5.7 02/26/2008 1010   RBC 5.19 02/26/2008 1010   HGB 16.6 02/26/2008 1010   HCT 48.4 02/26/2008 1010   PLT 213 02/26/2008 1010   MCV 93.3 02/26/2008 1010   MCHC 34.4 02/26/2008 1010   RDW 13.7 02/26/2008 1010   LYMPHSABS 1.1 02/26/2008 1010   MONOABS 0.3 02/26/2008 1010   EOSABS 0.1 02/26/2008 1010   BASOSABS 0.0 02/26/2008 1010     BNP No results found for this basename: probnp    Lipid Panel  No results found for this basename: chol,  trig,  hdl,  cholhdl,  vldl,  ldlcalc     RADIOLOGY: No results found.    ASSESSMENT AND PLAN: Danny Norris has documented at least moderate to moderately severe mitral regurgitation and has moderate holosystolic mitral valve prolapse involving  predominantly his P2 component. He has eccentrically directed  mitral regurgitation. Pulmonary pressures have been normal in the past. His echo Doppler study in December 2013 continued to show normal pulmonary pressures. His most recent echo, essentially is unchanged. On his most recent echo interpreted by different physician he was felt to have moderate MR bursa the past it was felt to be moderately severe. PA pressures now borderline elevated at 31 mm. He does not have any change in symptomatology. He does have T wave abnormalities. He did have normal coronary arteries at cardiac catheterization November 2012. He denies any shortness of breath. His blood pressure today is stable on his amlodipine and benazepril combination in addition to HCTZ 25 mg and Toprol-XL 50 mg. He's not having any peripheral edema on his current regimen. He does have hyperlipidemia and apparently has been off Crestor for 2 weeks. I will now give a prescription for atorvastatin 20 mg. I will wait 4 weeks to check a complete set of  laboratory and I will contact you regarding results and if changes are necessary to his medical regimen. I will see him in 6 months for cardiology reevaluation or sooner if problems arise.   Danny Sine, MD, [facility]  11/04/2013 8:09 AM

## 2013-11-04 NOTE — Patient Instructions (Signed)
Your physician recommends that you schedule a follow-up appointment in: 6 months  Your physician has recommended you make the following change in your medication: Stop Crestor and start Atorvastatin 20 mg  Your physician recommends that you return for lab work in: 4 Weeks CMP,CBC, TSH, FASTING LIPIDS

## 2014-01-01 LAB — CBC
HEMATOCRIT: 45.1 % (ref 39.0–52.0)
HEMOGLOBIN: 15.1 g/dL (ref 13.0–17.0)
MCH: 31.2 pg (ref 26.0–34.0)
MCHC: 33.5 g/dL (ref 30.0–36.0)
MCV: 93.2 fL (ref 78.0–100.0)
Platelets: 204 10*3/uL (ref 150–400)
RBC: 4.84 MIL/uL (ref 4.22–5.81)
RDW: 13.8 % (ref 11.5–15.5)
WBC: 6.7 10*3/uL (ref 4.0–10.5)

## 2014-01-01 LAB — COMPREHENSIVE METABOLIC PANEL
ALT: 28 U/L (ref 0–53)
AST: 25 U/L (ref 0–37)
Albumin: 4.2 g/dL (ref 3.5–5.2)
Alkaline Phosphatase: 58 U/L (ref 39–117)
BILIRUBIN TOTAL: 0.6 mg/dL (ref 0.2–1.2)
BUN: 14 mg/dL (ref 6–23)
CALCIUM: 9.5 mg/dL (ref 8.4–10.5)
CHLORIDE: 104 meq/L (ref 96–112)
CO2: 29 meq/L (ref 19–32)
CREATININE: 0.79 mg/dL (ref 0.50–1.35)
Glucose, Bld: 102 mg/dL — ABNORMAL HIGH (ref 70–99)
Potassium: 3.8 mEq/L (ref 3.5–5.3)
Sodium: 139 mEq/L (ref 135–145)
Total Protein: 6.5 g/dL (ref 6.0–8.3)

## 2014-01-01 LAB — LIPID PANEL
CHOL/HDL RATIO: 3.5 ratio
Cholesterol: 125 mg/dL (ref 0–200)
HDL: 36 mg/dL — AB (ref >39)
LDL CALC: 63 mg/dL (ref 0–99)
TRIGLYCERIDES: 132 mg/dL (ref <150)
VLDL: 26 mg/dL (ref 0–40)

## 2014-01-02 LAB — TSH: TSH: 2.1 u[IU]/mL (ref 0.350–4.500)

## 2014-03-24 ENCOUNTER — Other Ambulatory Visit: Payer: Self-pay

## 2014-03-24 MED ORDER — HYDROCHLOROTHIAZIDE 25 MG PO TABS: 25.0000 mg | ORAL_TABLET | Freq: Every day | ORAL | Status: DC

## 2014-03-24 NOTE — Telephone Encounter (Signed)
Rx was sent to pharmacy electronically. 

## 2014-05-23 ENCOUNTER — Other Ambulatory Visit: Payer: Self-pay | Admitting: *Deleted

## 2014-05-23 MED ORDER — AMLODIPINE BESY-BENAZEPRIL HCL 10-40 MG PO CAPS: 1.0000 | ORAL_CAPSULE | Freq: Every day | ORAL | Status: DC

## 2014-05-23 MED ORDER — METOPROLOL SUCCINATE ER 50 MG PO TB24: 50.0000 mg | ORAL_TABLET | Freq: Every day | ORAL | Status: DC

## 2014-08-23 ENCOUNTER — Other Ambulatory Visit: Payer: Self-pay | Admitting: Cardiovascular Disease

## 2014-08-25 NOTE — Telephone Encounter (Signed)
Rx was sent to pharmacy electronically. 

## 2014-09-10 ENCOUNTER — Encounter (HOSPITAL_COMMUNITY): Payer: Self-pay | Admitting: Cardiovascular Disease

## 2014-10-22 ENCOUNTER — Other Ambulatory Visit: Payer: Self-pay | Admitting: *Deleted

## 2014-10-22 MED ORDER — ATORVASTATIN CALCIUM 20 MG PO TABS: 20.0000 mg | ORAL_TABLET | Freq: Every day | ORAL | Status: DC

## 2014-10-22 NOTE — Telephone Encounter (Signed)
Rx refill sent to patient pharmacy   

## 2014-11-22 ENCOUNTER — Other Ambulatory Visit: Payer: Self-pay | Admitting: Cardiovascular Disease

## 2014-11-24 NOTE — Telephone Encounter (Signed)
Rx(s) sent to pharmacy electronically.  

## 2014-12-08 ENCOUNTER — Other Ambulatory Visit: Payer: Self-pay | Admitting: Cardiovascular Disease

## 2014-12-22 ENCOUNTER — Emergency Department (INDEPENDENT_AMBULATORY_CARE_PROVIDER_SITE_OTHER)
Admission: EM | Admit: 2014-12-22 | Discharge: 2014-12-22 | Disposition: A | Discharge status: Home / Self Care | Payer: Self-pay | Source: Home / Self Care | Attending: Emergency Medicine | Admitting: Emergency Medicine

## 2014-12-22 ENCOUNTER — Emergency Department (INDEPENDENT_AMBULATORY_CARE_PROVIDER_SITE_OTHER): Payer: Self-pay

## 2014-12-22 ENCOUNTER — Encounter (HOSPITAL_COMMUNITY): Payer: Self-pay | Admitting: Emergency Medicine

## 2014-12-22 DIAGNOSIS — S39012A Strain of muscle, fascia and tendon of lower back, initial encounter: Secondary | ICD-10-CM

## 2014-12-22 MED ORDER — CYCLOBENZAPRINE HCL 10 MG PO TABS: 10.0000 mg | ORAL_TABLET | Freq: Three times a day (TID) | ORAL | Status: DC | PRN

## 2014-12-22 MED ORDER — IBUPROFEN 800 MG PO TABS: 800.0000 mg | ORAL_TABLET | Freq: Three times a day (TID) | ORAL | Status: DC
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
## 2014-12-23 ENCOUNTER — Other Ambulatory Visit: Payer: Self-pay | Admitting: Cardiovascular Disease

## 2014-12-23 NOTE — Telephone Encounter (Signed)
Rx(s) sent to pharmacy electronically. Message sent to Dr. Kelly's scheduler to contact patient for appointment 

## 2014-12-24 ENCOUNTER — Telehealth: Payer: Self-pay | Admitting: Cardiovascular Disease

## 2014-12-24 NOTE — Telephone Encounter (Signed)
Closed encounter °

## 2014-12-29 ENCOUNTER — Other Ambulatory Visit: Payer: Self-pay | Admitting: Cardiovascular Disease

## 2014-12-29 NOTE — Telephone Encounter (Signed)
Rx refill sent to patient pharmacy   

## 2015-03-03 ENCOUNTER — Encounter: Payer: Self-pay | Admitting: Cardiovascular Disease

## 2015-03-03 ENCOUNTER — Ambulatory Visit (INDEPENDENT_AMBULATORY_CARE_PROVIDER_SITE_OTHER): Payer: BLUE CROSS/BLUE SHIELD | Admitting: Cardiovascular Disease

## 2015-03-03 VITALS — BP 110/74 | HR 54 | Ht 73.0 in | Wt 235.0 lb

## 2015-03-03 DIAGNOSIS — I10 Essential (primary) hypertension: Secondary | ICD-10-CM

## 2015-03-03 DIAGNOSIS — Z79899 Other long term (current) drug therapy: Secondary | ICD-10-CM | POA: Diagnosis not present

## 2015-03-03 DIAGNOSIS — I341 Nonrheumatic mitral (valve) prolapse: Secondary | ICD-10-CM

## 2015-03-03 DIAGNOSIS — I340 Nonrheumatic mitral (valve) insufficiency: Secondary | ICD-10-CM

## 2015-03-03 DIAGNOSIS — E785 Hyperlipidemia, unspecified: Secondary | ICD-10-CM

## 2015-03-03 DIAGNOSIS — I059 Rheumatic mitral valve disease, unspecified: Secondary | ICD-10-CM | POA: Diagnosis not present

## 2015-03-03 DIAGNOSIS — G4733 Obstructive sleep apnea (adult) (pediatric): Secondary | ICD-10-CM

## 2015-03-03 NOTE — Progress Notes (Signed)
Patient ID: Danny Norris, male   DOB: October 10, 1960, 54 y.o.   MRN: [id number]      HPI: Danny Norris is a 54 y.o. male who presents for a 51 month cardiology followup evaluation.  Ms. Danny Norris has a history of moderately severe to severe mitral regurgitation. A TEE evaluation in August 2012  showed normal LV function. He has significant mitral valve prolapse especially involving the P2 segment without clear evidence for ruptured chordae and had eccentric mitral regurgitation anteriorly directed. There was mild dilatation of the sinus Valsalva at 44 mm. Cardiac catheterization in November 2012  showed normal coronary arteries, mitral valve prolapse with 2+ MR and a normal pulmonary pressures. Pulmonary capillary wedge pressure at that time was 15. PA pressure 28/12.  An echo Doppler study in December 2013 revealed moderate holosystolic prolapse with moderate to severe MR directed eccentrically anteriorly. His LA was mildly dilated posterior pressure was normal at 22 mm Hg.  On October 23, 2013, [name] a one-year echo Doppler assessment. This showed an ejection fraction of 55-60% without regional wall motion abnormalities and with normal diastolic function. On this echo he was determined to have moderate prolapse involving the posterior leaflet with moderate regurgitation directed eccentrically anteriorly. His left atrium was felt to be mildly dilated. PA pressure was now approximately 31 mm.  Over the past 6 months, [name] denies any significant change in symptoms.  He has a history of hypertension, obstructive sleep apnea diagnosed in 2009 by Dr. Annamaria Norris, as well as hyperlipidemia.  Recently, he states he's not been very compliant with CPAP use.  He has been on Lotrel 10/40, hydrochlorothiazide 25 mg, and Toprol-XL 50 mg for blood pressure control.  He has been taking atorvastatin 20 mg for his hyperlipidemia in addition to coenzyme Q10 200 mg capsules.  [name] denies any recent  episodes of chest pain. He denies exertional shortness of breath. He denies change in symptoms.   Past Medical History  Diagnosis Date  . Mitral valve regurgitation     MVP, mod- severe MR 2D 12/13  . Normal coronary arteries 11/12  . HTN (hypertension)   . Dyslipidemia     Past Surgical History  Procedure Laterality Date  . Hernia repair Right 2009    Rt inguinal repair  . Eye surgery  1975  . Left and right heart catheterization with coronary angiogram N/A 09/01/2011    Procedure: LEFT AND RIGHT HEART CATHETERIZATION WITH CORONARY ANGIOGRAM;  Surgeon: Troy Sine, MD;  Location: [facility] CATH LAB;  Service: Cardiovascular;  Laterality: N/A;    Allergies  Allergen Reactions  . Contrast Media [Iodinated Diagnostic Agents] Rash    Current Outpatient Prescriptions  Medication Sig Dispense Refill  . amLODipine-benazepril (LOTREL) 10-40 MG per capsule Take 1 capsule by mouth daily. 30 capsule 10  . aspirin 81 MG chewable tablet Chew 81 mg by mouth daily.    Marland Kitchen atorvastatin (LIPITOR) 20 MG tablet Take 1 tablet (20 mg total) by mouth daily. 30 tablet 10  . Coenzyme Q10 (COQ10) 200 MG CAPS Take by mouth daily.    . hydrochlorothiazide (HYDRODIURIL) 25 MG tablet Take 1 tablet (25 mg total) by mouth daily. 30 tablet 10  . metoprolol succinate (TOPROL-XL) 50 MG 24 hr tablet Take 1 tablet (50 mg total) by mouth daily. 30 tablet 10   No current facility-administered medications for this visit.    Socially he is single. There are no children. He does exercise one to 2  days per week. There is no tobacco or alcohol use.  He works as a Garment/textile technologist at [employer].  ROS General: Negative; No fevers, chills, or night sweats;  HEENT: Negative; No changes in vision or hearing, sinus congestion, difficulty swallowing Pulmonary: Negative; No cough, wheezing, shortness of breath, hemoptysis Cardiovascular: Negative; No chest pain, presyncope, syncope, palpitations GI: Negative; No nausea,  vomiting, diarrhea, or abdominal pain GU: Negative; No dysuria, hematuria, or difficulty voiding Musculoskeletal: Negative; no myalgias, joint pain, or weakness Hematologic/Oncology: Negative; no easy bruising, bleeding Endocrine: Negative; no heat/cold intolerance; no diabetes Neuro: Negative; no changes in balance, headaches Skin: Negative; No rashes or skin lesions Psychiatric: Negative; No behavioral problems, depression Sleep: Negative; No snoring, daytime sleepiness, hypersomnolence, bruxism, restless legs, hypnogognic hallucinations, no cataplexy Other comprehensive 14 point system review is negative.  PE BP 110/74 mmHg  Pulse 54  Ht _0  (1.854 m)  Wt 235 lb (106.595 kg)  BMI 31.01 kg/m2  General: Alert, oriented, no distress.  Skin: normal turgor, no rashes HEENT: Normocephalic, atraumatic. Pupils round and reactive; sclera anicteric;no lid lag. No xanthelasma Nose without nasal septal hypertrophy Mouth/Parynx benign; Mallinpatti scale 3 Neck: No JVD, no carotid bruits with normal carotid upstroke Lungs: clear to ausculatation and percussion; no wheezing or rales Chest wall: No tenderness to Heart: RRR, s1 s2 normal; systolic click with a least 2/6 systolic murmur at apex concordant with his  at least moderate mitral regurgitation. Abdomen: soft, nontender; no hepatosplenomehaly, BS+; abdominal aorta nontender and not dilated by palpation. Back: No CVA tenderness Pulses 2+ Extremities: no clubbing cyanosis or edema, Homan's sign negative  Neurologic: grossly nonfocal Psychological: Normal cognition. Normal affect  ECG (independently read by me): Sinus bradycardia 54 bpm.  T-wave inversion in lead 05 November 2013 ECG  (independently read by me): Sinus bradycardia at 50 beats per minute. Nonspecific T. change in lead 3. Normal intervals.  Prior ECG from 05/02/2013: Sinus bradycardia 52 beats per minute. Previously noted T wave abnormality inferior  laterally.  LABS: BMP Latest Ref Rng 01/01/2014 02/26/2008  Glucose 70 - 99 mg/dL 102(H) 120(H)  BUN 6 - 23 mg/dL 14 8  Creatinine 0.50 - 1.35 mg/dL 0.79 0.93  Sodium 135 - 145 mEq/L 139 140  Potassium 3.5 - 5.3 mEq/L 3.8 3.6  Chloride 96 - 112 mEq/L 104 105  CO2 19 - 32 mEq/L 29 27  Calcium 8.4 - 10.5 mg/dL 9.5 9.9   Hepatic Function Latest Ref Rng 01/01/2014 02/26/2008  Total Protein 6.0 - 8.3 g/dL 6.5 6.6  Albumin 3.5 - 5.2 g/dL 4.2 4.1  AST 0 - 37 U/L 25 26  ALT 0 - 53 U/L 28 26  Alk Phosphatase 39 - 117 U/L 58 56  Total Bilirubin 0.2 - 1.2 mg/dL 0.6 1.1   CBC Latest Ref Rng 01/01/2014 02/26/2008  WBC 4.0 - 10.5 K/uL 6.7 5.7  Hemoglobin 13.0 - 17.0 g/dL 15.1 16.6  Hematocrit 39.0 - 52.0 % 45.1 48.4  Platelets 150 - 400 K/uL 204 213   Lab Results  Component Value Date   TSH 2.100 01/01/2014   Lipid Panel     Component Value Date/Time   CHOL 125 01/01/2014 0832   TRIG 132 01/01/2014 0832   HDL 36* 01/01/2014 0832   CHOLHDL 3.5 01/01/2014 0832   VLDL 26 01/01/2014 0832   LDLCALC 63 01/01/2014 0832    \  RADIOLOGY: No results found.    ASSESSMENT AND PLAN: [name] is a 54 year old white  male who has documented at least moderate to moderately severe mitral regurgitation and has moderate holosystolic mitral valve prolapse involving predominantly his P2 component. He has eccentrically directed  mitral regurgitation. Pulmonary pressures have been normal in the past. His echo Doppler study in December 2013 continued to show normal pulmonary pressures. His last echo in February 2015 was essentially unchanged.   PA pressures were borderline elevated at 31 mm. He does not have any change in symptomatology. He does have T wave abnormalities. He did have normal coronary arteries at cardiac catheterization November 2012.Presently,  he denies any shortness of breath. His blood pressure today is stable on his amlodipine and benazepril combination in addition to HCTZ 25 mg and Toprol-XL  50 mg. He's not having any peripheral edema on his current regimen.  He is tolerating atorvastatin 0.7-10 denies myalgias.  There no signs of edema, with his current dose of Hydrea chlorothiazide.  I am recommending that laboratory be obtained in the fasting state.  I'm also scheduling him for a follow-up echo Doppler study to reassess his mitral regurgitation.  I will contact him regarding these results.  If his results are stable, I will see him in one year for follow-up evaluation.  If significant changes have developed.  I will see him sooner.  Time spent: 25 minutes  Troy Sine, MD, [facility]  03/03/2015 8:13 PM

## 2015-03-03 NOTE — Patient Instructions (Signed)
Your physician has requested that you have an echocardiogram. Echocardiography is a painless test that uses sound waves to create images of your heart. It provides your doctor with information about the size and shape of your heart and how well your heart's chambers and valves are working. This procedure takes approximately one hour. There are no restrictions for this procedure.   Your physician recommends that you return for lab work fasting.  Your physician recommends that you schedule a follow-up appointment in:1 year or sooner if needed.

## 2015-03-17 ENCOUNTER — Ambulatory Visit (HOSPITAL_COMMUNITY)
Admission: RE | Admit: 2015-03-17 | Discharge: 2015-03-17 | Disposition: A | Discharge status: Home / Self Care | Payer: BLUE CROSS/BLUE SHIELD | Source: Ambulatory Visit | Attending: Internal Medicine | Admitting: Internal Medicine

## 2015-03-17 DIAGNOSIS — I059 Rheumatic mitral valve disease, unspecified: Secondary | ICD-10-CM

## 2015-03-17 DIAGNOSIS — R011 Cardiac murmur, unspecified: Secondary | ICD-10-CM | POA: Insufficient documentation

## 2015-03-17 DIAGNOSIS — I10 Essential (primary) hypertension: Secondary | ICD-10-CM | POA: Insufficient documentation

## 2015-03-17 DIAGNOSIS — Z8249 Family history of ischemic heart disease and other diseases of the circulatory system: Secondary | ICD-10-CM | POA: Diagnosis not present

## 2015-03-17 DIAGNOSIS — E785 Hyperlipidemia, unspecified: Secondary | ICD-10-CM | POA: Diagnosis not present

## 2015-03-17 LAB — CBC
HCT: 46 % (ref 39.0–52.0)
HEMOGLOBIN: 15.3 g/dL (ref 13.0–17.0)
MCH: 31.9 pg (ref 26.0–34.0)
MCHC: 33.3 g/dL (ref 30.0–36.0)
MCV: 95.8 fL (ref 78.0–100.0)
MPV: 9.3 fL (ref 8.6–12.4)
Platelets: 194 10*3/uL (ref 150–400)
RBC: 4.8 MIL/uL (ref 4.22–5.81)
RDW: 13.9 % (ref 11.5–15.5)
WBC: 6.6 10*3/uL (ref 4.0–10.5)

## 2015-03-18 LAB — COMPREHENSIVE METABOLIC PANEL
ALT: 27 U/L (ref 0–53)
AST: 25 U/L (ref 0–37)
Albumin: 4.2 g/dL (ref 3.5–5.2)
Alkaline Phosphatase: 59 U/L (ref 39–117)
BUN: 16 mg/dL (ref 6–23)
CALCIUM: 9.9 mg/dL (ref 8.4–10.5)
CHLORIDE: 105 meq/L (ref 96–112)
CO2: 27 meq/L (ref 19–32)
CREATININE: 0.88 mg/dL (ref 0.50–1.35)
GLUCOSE: 96 mg/dL (ref 70–99)
Potassium: 3.7 mEq/L (ref 3.5–5.3)
Sodium: 140 mEq/L (ref 135–145)
Total Bilirubin: 1 mg/dL (ref 0.2–1.2)
Total Protein: 6.3 g/dL (ref 6.0–8.3)

## 2015-03-18 LAB — LIPID PANEL
CHOL/HDL RATIO: 4 ratio
CHOLESTEROL: 127 mg/dL (ref 0–200)
HDL: 32 mg/dL — ABNORMAL LOW (ref >=40)
LDL Cholesterol: 73 mg/dL (ref 0–99)
Triglycerides: 112 mg/dL (ref <150)
VLDL: 22 mg/dL (ref 0–40)

## 2015-03-18 LAB — TSH: TSH: 1.606 u[IU]/mL (ref 0.350–4.500)

## 2015-03-20 ENCOUNTER — Encounter: Payer: Self-pay | Admitting: *Deleted

## 2015-04-07 ENCOUNTER — Encounter: Payer: Self-pay | Admitting: *Deleted

## 2015-11-29 ENCOUNTER — Other Ambulatory Visit: Payer: Self-pay | Admitting: Cardiovascular Disease

## 2015-11-30 NOTE — Telephone Encounter (Signed)
refill 

## 2016-05-02 ENCOUNTER — Other Ambulatory Visit: Payer: Self-pay | Admitting: Cardiovascular Disease

## 2016-05-10 ENCOUNTER — Other Ambulatory Visit: Payer: Self-pay | Admitting: Cardiovascular Disease

## 2016-05-10 NOTE — Telephone Encounter (Signed)
Rx has been sent to the pharmacy electronically. ° °

## 2016-05-30 ENCOUNTER — Other Ambulatory Visit: Payer: Self-pay | Admitting: Cardiovascular Disease

## 2016-05-30 NOTE — Telephone Encounter (Signed)
Rx request sent to pharmacy.  

## 2016-05-31 ENCOUNTER — Other Ambulatory Visit: Payer: Self-pay | Admitting: Cardiovascular Disease

## 2016-05-31 NOTE — Telephone Encounter (Signed)
Rx request sent to pharmacy.  

## 2016-06-11 ENCOUNTER — Other Ambulatory Visit: Payer: Self-pay | Admitting: Cardiology

## 2016-06-13 NOTE — Telephone Encounter (Signed)
Rx request sent to pharmacy.  

## 2016-06-26 ENCOUNTER — Other Ambulatory Visit: Payer: Self-pay | Admitting: Cardiology

## 2016-07-04 ENCOUNTER — Other Ambulatory Visit: Payer: Self-pay | Admitting: Cardiovascular Disease

## 2016-07-04 MED ORDER — ATORVASTATIN CALCIUM 20 MG PO TABS: 20.0000 mg | ORAL_TABLET | Freq: Every day | ORAL | 0 refills | Status: DC

## 2016-07-07 ENCOUNTER — Ambulatory Visit (INDEPENDENT_AMBULATORY_CARE_PROVIDER_SITE_OTHER): Payer: BLUE CROSS/BLUE SHIELD | Admitting: Student

## 2016-07-07 ENCOUNTER — Encounter: Payer: Self-pay | Admitting: Student

## 2016-07-07 VITALS — BP 125/84 | HR 64 | Ht 73.0 in | Wt 245.0 lb

## 2016-07-07 DIAGNOSIS — Z79899 Other long term (current) drug therapy: Secondary | ICD-10-CM | POA: Diagnosis not present

## 2016-07-07 DIAGNOSIS — I340 Nonrheumatic mitral (valve) insufficiency: Secondary | ICD-10-CM

## 2016-07-07 DIAGNOSIS — I341 Nonrheumatic mitral (valve) prolapse: Secondary | ICD-10-CM

## 2016-07-07 DIAGNOSIS — E785 Hyperlipidemia, unspecified: Secondary | ICD-10-CM

## 2016-07-07 DIAGNOSIS — G4733 Obstructive sleep apnea (adult) (pediatric): Secondary | ICD-10-CM

## 2016-07-07 DIAGNOSIS — I10 Essential (primary) hypertension: Secondary | ICD-10-CM

## 2016-07-07 MED ORDER — ATORVASTATIN CALCIUM 20 MG PO TABS: 20.0000 mg | ORAL_TABLET | Freq: Every day | ORAL | 3 refills | Status: DC

## 2016-07-07 MED ORDER — AMLODIPINE BESY-BENAZEPRIL HCL 10-40 MG PO CAPS: 1.0000 | ORAL_CAPSULE | Freq: Every day | ORAL | 3 refills | Status: DC

## 2016-07-07 MED ORDER — HYDROCHLOROTHIAZIDE 25 MG PO TABS: 25.0000 mg | ORAL_TABLET | Freq: Every day | ORAL | 3 refills | Status: DC

## 2016-07-07 MED ORDER — METOPROLOL SUCCINATE ER 50 MG PO TB24: 50.0000 mg | ORAL_TABLET | Freq: Every day | ORAL | 3 refills | Status: DC

## 2016-07-07 NOTE — Progress Notes (Signed)
Cardiology Office Note    Date:  07/07/2016   ID:  Danny Norris, DOB Sep 20, 1961, MRN [id number]  PCP:  Paulino Rily, MD  Cardiologist:  Dr. Tresa Endo  Chief Complaint  Patient presents with  . Follow-up    a. annual appointment, no specific complaints.    History of Present Illness:    Danny Norris is a 55 y.o. male with past medical history of moderate mitral regurgitation with MVP, normal cors by cath in 2012, HTN, HLD and OSA who presents to the office today for routine follow-up.   Was last seen by Dr. Tresa Endo in 02/2015 and reported doing well at that time. Echocardiogram was performed on 03/17/2015 which showed preserved EF of 65-70% with no wall motion abnormalities. Posterior mitral valve leaflet was thickened and there was prolapse with mild mitral regurgitation, directed centrally and posteriorly. Prior workup includes a TEE in August 2012 which showed significant mitral valve prolapse especially involving the P2 segment without clear evidence for ruptured chordae and had eccentric mitral regurgitation anteriorly directed. Cardiac catheterization in 08/2011  showed normal coronary arteries, mitral valve prolapse with 2+ MR and a normal pulmonary pressures.   Today, he presents for annual follow-up and reports doing well. He denies any recent chest discomfort, palpitations, dyspnea with exertion, orthopnea, PND, or lower extremity edema. No syncopal events. He notes good compliance with his medications. Does not check BP regularly, but when he does, says it is well-controlled.  He walks at local parks 2-3 days per week and denies any anginal symptoms with this. He works at [employer] and lifts heavy boxes occasionally. He has a CPAP but rarely uses it due to the mask making him feel claustrophobic and his "lungs feeling full" after using it.No alcohol use, tobacco use, or recreational drug use.    Past Medical History:  Diagnosis Date  . Dyslipidemia   . HTN (hypertension)   .  Mitral valve regurgitation    a. 05/2011: TEE showing significant MVP especially involving the P2 segment without evidence for ruptured chordae and had eccentric MR anteriorly directed. b. 03/2015: echo showing EF 65-70% with posterior MV leaflet thickening and prolapse with mild MR.  . Normal coronary arteries    a. normal cors by cath in 08/2011    Past Surgical History:  Procedure Laterality Date  . EYE SURGERY  1975  . HERNIA REPAIR Right 2009   Rt inguinal repair  . LEFT AND RIGHT HEART CATHETERIZATION WITH CORONARY ANGIOGRAM N/A 09/01/2011   Procedure: LEFT AND RIGHT HEART CATHETERIZATION WITH CORONARY ANGIOGRAM;  Surgeon: Lennette Bihari, MD;  Location: [facility] CATH LAB;  Service: Cardiovascular;  Laterality: N/A;    Current Medications: Outpatient Medications Prior to Visit  Medication Sig Dispense Refill  . aspirin 81 MG chewable tablet Chew 81 mg by mouth daily.    . Coenzyme Q10 (COQ10) 200 MG CAPS Take by mouth daily.    Marland Kitchen amLODipine-benazepril (LOTREL) 10-40 MG capsule TAKE ONE CAPSULE BY MOUTH EVERY DAY (NEEDS TO SCHEDULE AN OFFICE VISIT) 30 capsule 0  . atorvastatin (LIPITOR) 20 MG tablet Take 1 tablet (20 mg total) by mouth daily at 6 PM. Please schedule appointment for refills. 15 tablet 0  . hydrochlorothiazide (HYDRODIURIL) 25 MG tablet TAKE 1 TABLET BY MOUTH EVERY DAY (NEEDS AN OFFICE VISIT) 30 tablet 0  . metoprolol succinate (TOPROL-XL) 50 MG 24 hr tablet TAKE 1 TABLET (50 MG TOTAL) BY MOUTH DAILY. NEED APPOINTMENT BEFORE ANYMORE REFILLS. 15 tablet 0  No facility-administered medications prior to visit.      Allergies:   Contrast media [iodinated diagnostic agents]   Social History   Social History  . Marital status: Single    Spouse name: N/A  . Number of children: N/A  . Years of education: N/A   Social History Main Topics  . Smoking status: Never Smoker  . Smokeless tobacco: None  . Alcohol use No  . Drug use: Unknown  . Sexual activity: Not Asked    Other Topics Concern  . None   Social History Narrative  . None     Family History:  The patient's family history includes Congestive Heart Failure in his mother; Sleep apnea in his brother.   Review of Systems:   Please see the history of present illness.     General:  No chills, fever, night sweats or weight changes. Positive for weight gain. Cardiovascular:  No chest pain, dyspnea on exertion, edema, orthopnea, palpitations, paroxysmal nocturnal dyspnea. Dermatological: No rash, lesions/masses Respiratory: No cough, dyspnea Urologic: No hematuria, dysuria Abdominal:   No nausea, vomiting, diarrhea, bright red blood per rectum, melena, or hematemesis Neurologic:  No visual changes, wkns, changes in mental status. All other systems reviewed and are otherwise negative except as noted above.   Physical Exam:    VS:  BP 125/84   Pulse 64   Ht 6\' 1"  (1.854 m)   Wt 245 lb (111.1 kg)   BMI 32.32 kg/m    General: Well developed, well nourished,male appearing in no acute distress. Head: Normocephalic, atraumatic, sclera non-icteric, no xanthomas, nares are without discharge.  Neck: No carotid bruits. JVD not elevated.  Lungs: Respirations regular and unlabored, without wheezes or rales.  Heart: Regular rate and rhythm. No S3 or S4.  No rubs, or gallops appreciated. 3/6 SEM at [place].  Abdomen: Soft, non-tender, non-distended with normoactive bowel sounds. No hepatomegaly. No rebound/guarding. No obvious abdominal masses. Msk:  Strength and tone appear normal for age. No joint deformities or effusions. Extremities: No clubbing or cyanosis. No edema.  Distal pedal pulses are 2+ bilaterally. Neuro: Alert and oriented X 3. Moves all extremities spontaneously. No focal deficits noted. Psych:  Responds to questions appropriately with a normal affect. Skin: No rashes or lesions noted  Wt Readings from Last 3 Encounters:  07/07/16 245 lb (111.1 kg)  03/03/15 235 lb (106.6 kg)   11/04/13 229 lb 4.8 oz (104 kg)     Studies/Labs Reviewed:   EKG:  EKG is ordered today.  The ekg ordered today demonstrates NSR, HR 64, with no acute ST or T-wave changes.   Recent Labs: No results found for requested labs within last 8760 hours.   Lipid Panel    Component Value Date/Time   CHOL 127 03/17/2015 0809   TRIG 112 03/17/2015 0809   HDL 32 (L) 03/17/2015 0809   CHOLHDL 4.0 03/17/2015 0809   VLDL 22 03/17/2015 0809   LDLCALC 73 03/17/2015 0809    Additional studies/ records that were reviewed today include:   Echocardiogram: 03/17/2015 Study Conclusions  - Left ventricle: The cavity size was normal. Systolic function was   vigorous. The estimated ejection fraction was in the range of 65%   to 70%. Wall motion was normal; there were no regional wall   motion abnormalities. Left ventricular diastolic function   parameters were normal. - Aortic valve: There was trivial regurgitation. - Aortic root: Mildly dilated aortic root measuring 43 mm. - Ascending aorta: The ascending aorta was  normal in size. - Mitral valve: Posterior mitral valve leaflet is thickened and   there is a prolapse with mild mitral regurgitation. There was   mild regurgitation directed centrally and posteriorly. - Right ventricle: Systolic function was normal. - Right atrium: The atrium was normal in size. - Tricuspid valve: There was mild regurgitation. - Pulmonic valve: There was no regurgitation. - Pulmonary arteries: Systolic pressure was within the normal   range. - Inferior vena cava: The vessel was normal in size. The   respirophasic diameter changes were in the normal range (= 50%),   consistent with normal central venous pressure. - Pericardium, extracardiac: There was no pericardial effusion.   Cardiac Catheterization: 08/2011 IMPRESSION: 1. Normal left ventricular function. 2. Angiographic mitral valve prolapse with mitral regurgitation     extending to the superior atrial  wall on the LAO projection. 3. Normal pulmonary pressures without evidence for pulmonary     hypertension and with absence of significant V wave noted on     pulmonary capillary wedge tracing. 4. Normal coronary arteries.  Assessment:    1. MVP (mitral valve prolapse)   2. Mitral valve insufficiency, unspecified etiology   3. Essential hypertension   4. Hyperlipidemia, unspecified hyperlipidemia type   5. OSA (obstructive sleep apnea)   6. Medication management      Plan:   In order of problems listed above:  1. Mitral Valve Prolapse/ Mitral Valve Regurgitation - TEE in 05/2011 showed significant mitral valve prolapse especially involving the P2 segment without clear evidence for ruptured chordae and had eccentric mitral regurgitation anteriorly directed. Cardiac catheterization in 08/2011 with mitral valve prolapse and 2+ MR with normal pulmonary pressures.  - echo last year with preserved EF of 65-70% with no wall motion abnormalities. Posterior mitral valve leaflet was thickened and there was a prolapse with mild mitral regurgitation, directed centrally and posteriorly.  - he denies any dyspnea with exertion, chest discomfort, or presyncopal events.  - will obtain repeat echocardiogram to assess mitral regurgitation. If no significant changes, can keep annual follow-up. Otherwise, will need to be reevaluated sooner.    2. Essential HTN - BP well-controlled - continue current medication regimen with Amlodipine-Benazepril, HCTZ, and Toprol-XL.   3. HLD - Lipid Panel in 03/2015 showed total cholesterol 127, HDL 32, and LDL 73. - continue Atorvastatin 20mg  daily. Will recheck Lipid Panel and CMET. Patient not fasting today, will bring back next week for fasting labs.   4. OSA - diagnosed in 2009. Has CPAP but not fully compliant due to mask making him feel claustrophobic and his "lungs feeling full" after using it. - followed by Dr. Maple HudsonYoung. Likely needs current settings adjusted.     Medication Adjustments/Labs and Tests Ordered: Current medicines are reviewed at length with the patient today.  Concerns regarding medicines are outlined above.  Medication changes, Labs and Tests ordered today are listed in the Patient Instructions below. Patient Instructions  Medication Instructions:  Continue current medications  Labwork: Fasting Lipids and CMP  Testing/Procedures: Your physician has requested that you have an echocardiogram. Echocardiography is a painless test that uses sound waves to create images of your heart. It provides your doctor with information about the size and shape of your heart and how well your heart's chambers and valves are working. This procedure takes approximately one hour. There are no restrictions for this procedure.   Follow-Up: Your physician wants you to follow-up in: 1 Year with Dr Tresa EndoKelly. You will receive a reminder letter in  the mail two months in advance. If you don't receive a letter, please call our office to schedule the follow-up appointment.   Any Other Special Instructions Will Be Listed Below (If Applicable).   If you need a refill on your cardiac medications before your next appointment, please call your pharmacy.      Signed, Ellsworth Lennox, PA  07/07/2016 3:45 PM    [facility] [address] [place], [address] [place], [place]  [address] Phone: [phone]; Fax: [phone]  [address], [address] [place], [place] [address] Phone: [phone]

## 2016-07-07 NOTE — Patient Instructions (Addendum)
Medication Instructions:  Continue current medications  Labwork: Fasting Lipids and CMP  Testing/Procedures: Your physician has requested that you have an echocardiogram. Echocardiography is a painless test that uses sound waves to create images of your heart. It provides your doctor with information about the size and shape of your heart and how well your heart's chambers and valves are working. This procedure takes approximately one hour. There are no restrictions for this procedure.   Follow-Up: Your physician wants you to follow-up in: 1 Year with Dr Tresa EndoKelly. You will receive a reminder letter in the mail two months in advance. If you don't receive a letter, please call our office to schedule the follow-up appointment.   Any Other Special Instructions Will Be Listed Below (If Applicable).   If you need a refill on your cardiac medications before your next appointment, please call your pharmacy.

## 2016-07-20 ENCOUNTER — Ambulatory Visit (HOSPITAL_COMMUNITY): Payer: BLUE CROSS/BLUE SHIELD | Attending: Cardiology

## 2016-07-20 ENCOUNTER — Other Ambulatory Visit: Payer: Self-pay

## 2016-07-20 ENCOUNTER — Other Ambulatory Visit: Payer: BLUE CROSS/BLUE SHIELD | Admitting: *Deleted

## 2016-07-20 DIAGNOSIS — I351 Nonrheumatic aortic (valve) insufficiency: Secondary | ICD-10-CM | POA: Diagnosis not present

## 2016-07-20 DIAGNOSIS — I071 Rheumatic tricuspid insufficiency: Secondary | ICD-10-CM | POA: Diagnosis not present

## 2016-07-20 DIAGNOSIS — I10 Essential (primary) hypertension: Secondary | ICD-10-CM | POA: Diagnosis not present

## 2016-07-20 DIAGNOSIS — G4733 Obstructive sleep apnea (adult) (pediatric): Secondary | ICD-10-CM | POA: Insufficient documentation

## 2016-07-20 DIAGNOSIS — I051 Rheumatic mitral insufficiency: Secondary | ICD-10-CM

## 2016-07-20 DIAGNOSIS — I371 Nonrheumatic pulmonary valve insufficiency: Secondary | ICD-10-CM | POA: Diagnosis not present

## 2016-07-20 DIAGNOSIS — E7800 Pure hypercholesterolemia, unspecified: Secondary | ICD-10-CM

## 2016-07-20 DIAGNOSIS — I341 Nonrheumatic mitral (valve) prolapse: Secondary | ICD-10-CM | POA: Insufficient documentation

## 2016-07-20 DIAGNOSIS — E785 Hyperlipidemia, unspecified: Secondary | ICD-10-CM | POA: Diagnosis not present

## 2016-07-20 LAB — LIPID PANEL
CHOL/HDL RATIO: 4.2 ratio (ref <=5.0)
CHOLESTEROL: 131 mg/dL (ref 125–200)
HDL: 31 mg/dL — AB (ref >=40)
LDL Cholesterol: 75 mg/dL (ref <130)
Triglycerides: 123 mg/dL (ref <150)
VLDL: 25 mg/dL (ref <30)

## 2016-07-20 LAB — COMPREHENSIVE METABOLIC PANEL
ALK PHOS: 64 U/L (ref 40–115)
ALT: 24 U/L (ref 9–46)
AST: 22 U/L (ref 10–35)
Albumin: 4.1 g/dL (ref 3.6–5.1)
BUN: 12 mg/dL (ref 7–25)
CALCIUM: 9.8 mg/dL (ref 8.6–10.3)
CO2: 30 mmol/L (ref 20–31)
Chloride: 102 mmol/L (ref 98–110)
Creat: 0.95 mg/dL (ref 0.70–1.33)
Glucose, Bld: 100 mg/dL — ABNORMAL HIGH (ref 65–99)
POTASSIUM: 3.6 mmol/L (ref 3.5–5.3)
Sodium: 139 mmol/L (ref 135–146)
TOTAL PROTEIN: 5.9 g/dL — AB (ref 6.1–8.1)
Total Bilirubin: 1 mg/dL (ref 0.2–1.2)

## 2016-07-20 NOTE — Addendum Note (Signed)
Addended by: Tonita PhoenixBOWDEN, ROBIN K on: 07/20/2016 08:05 AM   Modules accepted: Orders

## 2016-07-27 ENCOUNTER — Telehealth: Payer: Self-pay | Admitting: *Deleted

## 2016-07-27 ENCOUNTER — Encounter: Payer: Self-pay | Admitting: *Deleted

## 2016-07-27 NOTE — Telephone Encounter (Signed)
-----   Message from Lennette Biharihomas A Kelly, MD sent at 07/24/2016 11:27 AM EDT ----- Labs good

## 2016-07-28 ENCOUNTER — Telehealth: Payer: Self-pay | Admitting: Cardiovascular Disease

## 2016-07-28 NOTE — Telephone Encounter (Signed)
Pt would like his echo results from last week please.

## 2016-07-28 NOTE — Telephone Encounter (Signed)
  Reviewed echocardiogram results with Dr. Tresa EndoKelly, which showed an EF of  60% to 65% with no regional wall motion abnormalities. Severe mitral valve prolapse, involving the posterior leaflet along with moderate regurgitation was noted. TEE was recommended by reading physician to further assess.   With prior workup including a TEE in August 2012 which showed significant mitral valve prolapse and cardiac catheterization showing mitral valve prolapse with 2+ MR and normal pulmonary pressures, Dr. Tresa EndoKelly recommended postponing TEE at this time. Instead recommends a repeat transthoracic echocardiogram in 6 months with follow-up with Dr. Tresa EndoKelly following his imaging studies.   I attempted to contact the patient to review the results, as the results never came to my in-basket and he had called inquiring about these. LVM that we will get someone from the office to call him tomorrow with the recommendations but overall no significant interval changes on his echo.   Signed, Ellsworth LennoxBrittany M Roshawn Ayala, PA-C 07/28/2016, 5:34 PM Pager: [phone]

## 2016-07-29 ENCOUNTER — Other Ambulatory Visit: Payer: Self-pay | Admitting: *Deleted

## 2016-07-29 DIAGNOSIS — I059 Rheumatic mitral valve disease, unspecified: Secondary | ICD-10-CM

## 2016-07-29 NOTE — Telephone Encounter (Signed)
Spoke with pt, aware of echo results. 

## 2017-01-30 ENCOUNTER — Telehealth (HOSPITAL_COMMUNITY): Payer: Self-pay | Admitting: Radiology

## 2017-01-30 NOTE — Telephone Encounter (Signed)
Called to schedule an echo per Dr. Jens Som

## 2017-02-21 ENCOUNTER — Telehealth (HOSPITAL_COMMUNITY): Payer: Self-pay | Admitting: Cardiovascular Disease

## 2017-02-24 NOTE — Telephone Encounter (Signed)
02/21/2017 11:24 AM Phone (Outgoing) Mitchel HonourDodson, Teran T (Self) [phone] (H)   Left Message - Called pt and lmsg for him to CB to get scheduled for echo Dr. Tresa EndoKelly wanted him to have .     By Elita BooneGriffin, Angelynn Lemus A

## 2017-03-08 ENCOUNTER — Ambulatory Visit (HOSPITAL_COMMUNITY): Payer: BLUE CROSS/BLUE SHIELD | Attending: Cardiology

## 2017-03-08 ENCOUNTER — Other Ambulatory Visit: Payer: Self-pay

## 2017-03-08 DIAGNOSIS — I059 Rheumatic mitral valve disease, unspecified: Secondary | ICD-10-CM | POA: Diagnosis present

## 2017-06-25 ENCOUNTER — Other Ambulatory Visit: Payer: Self-pay | Admitting: Physician Assistant

## 2017-06-28 ENCOUNTER — Other Ambulatory Visit: Payer: Self-pay | Admitting: Physician Assistant

## 2017-07-03 ENCOUNTER — Other Ambulatory Visit: Payer: Self-pay | Admitting: Physician Assistant

## 2017-07-03 NOTE — Telephone Encounter (Signed)
REFILL 

## 2017-09-29 ENCOUNTER — Other Ambulatory Visit: Payer: Self-pay | Admitting: Physician Assistant

## 2017-09-29 NOTE — Telephone Encounter (Signed)
This is Dr. Kelly's pt. °

## 2017-11-03 ENCOUNTER — Other Ambulatory Visit: Payer: Self-pay | Admitting: Cardiovascular Disease

## 2017-11-06 NOTE — Telephone Encounter (Signed)
Rx request sent to pharmacy.  

## 2017-11-18 ENCOUNTER — Other Ambulatory Visit: Payer: Self-pay | Admitting: Cardiovascular Disease

## 2017-12-02 ENCOUNTER — Other Ambulatory Visit: Payer: Self-pay | Admitting: Cardiovascular Disease

## 2017-12-16 ENCOUNTER — Other Ambulatory Visit: Payer: Self-pay | Admitting: Cardiovascular Disease

## 2017-12-19 ENCOUNTER — Other Ambulatory Visit: Payer: Self-pay | Admitting: Cardiovascular Disease

## 2017-12-30 ENCOUNTER — Other Ambulatory Visit: Payer: Self-pay | Admitting: Cardiovascular Disease

## 2018-01-13 ENCOUNTER — Other Ambulatory Visit: Payer: Self-pay | Admitting: Cardiovascular Disease

## 2018-03-16 ENCOUNTER — Other Ambulatory Visit: Payer: Self-pay | Admitting: Cardiovascular Disease

## 2018-03-16 NOTE — Telephone Encounter (Signed)
Rx has been sent to the pharmacy electronically. ° °

## 2018-04-28 ENCOUNTER — Other Ambulatory Visit: Payer: Self-pay | Admitting: Cardiovascular Disease

## 2019-07-31 ENCOUNTER — Telehealth: Payer: Self-pay | Admitting: Cardiovascular Disease

## 2019-07-31 MED ORDER — AMLODIPINE BESY-BENAZEPRIL HCL 10-40 MG PO CAPS: ORAL_CAPSULE | ORAL | 0 refills | Status: DC

## 2019-07-31 MED ORDER — HYDROCHLOROTHIAZIDE 25 MG PO TABS: 25.0000 mg | ORAL_TABLET | Freq: Every day | ORAL | 0 refills | Status: DC

## 2019-07-31 MED ORDER — METOPROLOL SUCCINATE ER 50 MG PO TB24: 50.0000 mg | ORAL_TABLET | Freq: Every day | ORAL | 0 refills | Status: DC

## 2019-07-31 NOTE — Telephone Encounter (Signed)
Spoke with patient and his father. His father reports patient has been more tired/lethargic and has had elevated BP for the past 3 days but has not been feeling well for several weeks. Patient has not seen cardiology in >3 years. Upon doing med review with patient/father, discovered that patient has likely NOT been taking amlodipine-benzapril, hctz, or metoprolol succinate as his pill bottles are empty and the prescribing provider listed on them is Dr. Claiborne Billings and our office had not refilled any of those medications in >1 year as he needed an appointment. Authorized a 30 day supply of meds so patient can resume and scheduled him to see MD on 08/02/19 @ 1:40pm in 48 hour acute spot.   Routed to Dr. Claiborne Billings as Juluis Rainier

## 2019-07-31 NOTE — Telephone Encounter (Signed)
  Pt c/o BP issue: STAT if pt c/o blurred vision, one-sided weakness or slurred speech  1. What are your last 5 BP readings? 10/26-189/160, 10/27-237/180, 10/28-172/150  2. Are you having any other symptoms (ex. Dizziness, headache, blurred vision, passed out)? Patient denies any symptoms, dad says patient seemed lethargic  3. What is your BP issue? Father is calling because patient has been having high BP and some lethargy. Patient denies any other symptoms. Dad would like to know what they should do.

## 2019-08-02 ENCOUNTER — Encounter: Payer: Self-pay | Admitting: Cardiovascular Disease

## 2019-08-02 ENCOUNTER — Ambulatory Visit (INDEPENDENT_AMBULATORY_CARE_PROVIDER_SITE_OTHER): Payer: BC Managed Care – PPO | Admitting: Cardiovascular Disease

## 2019-08-02 ENCOUNTER — Other Ambulatory Visit: Payer: Self-pay

## 2019-08-02 VITALS — BP 144/88 | HR 61 | Temp 97.7°F | Wt 234.0 lb

## 2019-08-02 DIAGNOSIS — I340 Nonrheumatic mitral (valve) insufficiency: Secondary | ICD-10-CM

## 2019-08-02 DIAGNOSIS — G4733 Obstructive sleep apnea (adult) (pediatric): Secondary | ICD-10-CM | POA: Diagnosis not present

## 2019-08-02 DIAGNOSIS — Z79899 Other long term (current) drug therapy: Secondary | ICD-10-CM

## 2019-08-02 DIAGNOSIS — I341 Nonrheumatic mitral (valve) prolapse: Secondary | ICD-10-CM

## 2019-08-02 DIAGNOSIS — I10 Essential (primary) hypertension: Secondary | ICD-10-CM

## 2019-08-02 DIAGNOSIS — I051 Rheumatic mitral insufficiency: Secondary | ICD-10-CM

## 2019-08-02 MED ORDER — METOPROLOL SUCCINATE ER 50 MG PO TB24: 50.0000 mg | ORAL_TABLET | Freq: Every day | ORAL | 6 refills | Status: DC

## 2019-08-02 MED ORDER — AMLODIPINE BESY-BENAZEPRIL HCL 10-40 MG PO CAPS: ORAL_CAPSULE | ORAL | 6 refills | Status: DC

## 2019-08-02 MED ORDER — HYDROCHLOROTHIAZIDE 25 MG PO TABS: 25.0000 mg | ORAL_TABLET | Freq: Every day | ORAL | 6 refills | Status: DC

## 2019-08-02 NOTE — Patient Instructions (Addendum)
Medication Instructions:  The current medical regimen is effective;  continue present plan and medications as directed. Please refer to the Current Medication list given to you today. If you need a refill on your cardiac medications before your next appointment, please call your pharmacy.  Labwork: FASTING LIPID,CMET,TSH AND CBC HERE IN OUR OFFICE AT [facility]    You will need to fast. DO NOT EAT OR DRINK PAST MIDNIGHT.       If you have labs (blood work) drawn today and your tests are completely normal, you will receive your results only by: Marland Kitchen MyChart Message (if you have MyChart) OR . A paper copy in the mail If you have any lab test that is abnormal or we need to change your treatment, we will call you to review the results.  Testing/Procedures: Echocardiogram - Your physician has requested that you have an echocardiogram. Echocardiography is a painless test that uses sound waves to create images of your heart. It provides your doctor with information about the size and shape of your heart and how well your heart's chambers and valves are working. This procedure takes approximately one hour. There are no restrictions for this procedure. This will be performed at our [facility] location - [address].  Follow-Up: IN 6-8 WEEKS  In Person You may see Shelva Majestic, MD or one of the following Advanced Practice Providers on your designated Care Team:  Almyra Deforest, PA-C  Fabian Sharp, PA-C or [place], [place].    At [facility], you and your health needs are our priority.  As part of our continuing mission to provide you with exceptional heart care, we have created designated Provider Care Teams.  These Care Teams include your primary Cardiologist (physician) and Advanced Practice Providers (APPs -  Physician Assistants and Nurse Practitioners) who all work together to provide you with the care you need, when you need it.  Thank you for choosing [facility] at [facility]!!

## 2019-08-02 NOTE — Progress Notes (Signed)
Patient ID: Danny Norris, male   DOB: 05/05/1961, 57 y.o.   MRN: [id number] ° ° ° ° ° °HPI: Danny Norris is a 57 y.o. male who presents for a follow-up cardiology followup evaluation. I last saw him in May 2016.  ° °Danny Norris has a history of moderately severe to severe mitral regurgitation. A TEE evaluation in August 2012  showed normal LV function. He has significant mitral valve prolapse especially involving the P2 segment without clear evidence for ruptured chordae and had eccentric mitral regurgitation anteriorly directed. There was mild dilatation of the sinus Valsalva at 44 mm. Cardiac catheterization in November 2012  showed normal coronary arteries, mitral valve prolapse with 2+ MR and a normal pulmonary pressures. Pulmonary capillary wedge pressure at that time was 15. PA pressure 28/12. ° °An echo Doppler study in December 2013 revealed moderate holosystolic prolapse with moderate to severe MR directed eccentrically anteriorly. His LA was mildly dilated posterior pressure was normal at 22 mm Hg. ° °On October 23, 2013, Danny Norris underwent a one-year echo Doppler assessment. This showed an ejection fraction of 55-60% without regional wall motion abnormalities and with normal diastolic function. On this echo he was determined to have moderate prolapse involving the posterior leaflet with moderate regurgitation directed eccentrically anteriorly. His left atrium was felt to be mildly dilated. PA pressure was now approximately 31 mm. ° °When I last saw him in 2016 he denied any significant change in symptoms and denied any chest pain, exertional dyspnea, PND orthopnea..  He has a history of hypertension, obstructive sleep apnea diagnosed in 2009 by Dr. Young, as well as hyperlipidemia.  He was only intermittently compliant with CPAP use.  He has been on Lotrel 10/40, hydrochlorothiazide 25 mg, and Toprol-XL 50 mg for blood pressure control.  He has been taking atorvastatin 20 mg for his hyperlipidemia in  addition to coenzyme Q10 200 mg capsules. ° °He was last seen by Brittany Strader on July 07, 2016 and remained stable.  He was walking 2 to 3 days/week and denied anginal symptoms.  He was only rarely using his CPAP at that time. 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  Apparently he believes he may have run out of his medications well over a year and a half ago.  His father had recently noticed this and contacted our office.  Of his blood pressure medication, his blood pressure had risen recently to 172/115.  A short supply of amlodipine/benazepril 10/40, Toprol 50 mg and HCTZ 25 called into his pharmacy.  Apparently he has been on therapy for approximately a week and presents to the office for evaluation.   He has not had recent laboratory or echocardiographic evaluation.  He presents for evaluation. ° °Past Medical History:  °Diagnosis Date  °• Dyslipidemia   °• HTN (hypertension)   °• Mitral valve regurgitation   ° a. 05/2011: TEE showing significant MVP especially involving the P2 segment without evidence for ruptured chordae and had eccentric MR anteriorly directed. b. 03/2015: echo showing EF 65-70% with posterior MV leaflet thickening and prolapse with mild MR.  °• Normal coronary arteries   ° a. normal cors by cath in 08/2011  ° ° °Past Surgical History:  °Procedure Laterality Date  °• EYE SURGERY  1975  °• HERNIA REPAIR Right 2009  ° Rt inguinal repair  °• LEFT AND RIGHT HEART CATHETERIZATION WITH CORONARY ANGIOGRAM N/A 09/01/2011  ° Procedure: LEFT AND RIGHT HEART   CATHETERIZATION WITH CORONARY ANGIOGRAM;  Surgeon: Troy Sine, MD;  Location: [facility] CATH LAB;  Service: Cardiovascular;  Laterality: N/A;    Allergies  Allergen Reactions   Contrast Media [Iodinated Diagnostic Agents] Rash    Current Outpatient Medications  Medication Sig Dispense Refill   amLODipine-benazepril (LOTREL) 10-40 MG capsule TAKE 1 CAPSULE BY MOUTH EVERY DAY 30 capsule 6     aspirin 81 MG chewable tablet Chew 81 mg by mouth daily.     atorvastatin (LIPITOR) 20 MG tablet TAKE 1 TABLET (20 MG TOTAL) BY MOUTH DAILY AT 6 PM *NEED OFFICE VISIT FOR REFILLS* 15 tablet 0   Coenzyme Q10 (COQ10) 200 MG CAPS Take by mouth daily.     hydrochlorothiazide (HYDRODIURIL) 25 MG tablet Take 1 tablet (25 mg total) by mouth daily. 30 tablet 6   metoprolol succinate (TOPROL-XL) 50 MG 24 hr tablet Take 1 tablet (50 mg total) by mouth daily. Take with or immediately following a meal. 30 tablet 6   No current facility-administered medications for this visit.     Socially he is single. There are no children. He does exercise one to 2 days per week. There is no tobacco or alcohol use.  He works as a Garment/textile technologist at [employer].  Family history is notable that his mother is age 65 and his father is age 77.  His brother age 9 died of an aneurysm in 2017/04/27.  ROS General: Negative; No fevers, chills, or night sweats;  HEENT: Negative; No changes in vision or hearing, sinus congestion, difficulty swallowing Pulmonary: Negative; No cough, wheezing, shortness of breath, hemoptysis Cardiovascular: Negative; No chest pain, presyncope, syncope, palpitations GI: Negative; No nausea, vomiting, diarrhea, or abdominal pain GU: Negative; No dysuria, hematuria, or difficulty voiding Musculoskeletal: Negative; no myalgias, joint pain, or weakness Hematologic/Oncology: Negative; no easy bruising, bleeding Endocrine: Negative; no heat/cold intolerance; no diabetes Neuro: Negative; no changes in balance, headaches Skin: Negative; No rashes or skin lesions Psychiatric: Negative; No behavioral problems, depression Sleep: Negative; No snoring, daytime sleepiness, hypersomnolence, bruxism, restless legs, hypnogognic hallucinations, no cataplexy Other comprehensive 14 point system review is negative.  PE BP (!) 144/88    Pulse 61    Temp 97.7 F (36.5 C)    Wt 234 lb (106.1 kg)    SpO2 92%     BMI 30.87 kg/m    Repeat blood pressure by me 138/86  General: Alert, oriented, no distress.  Skin: normal turgor, no rashes, warm and dry HEENT: Normocephalic, atraumatic. Pupils equal round and reactive to light; sclera anicteric; extraocular muscles intact;  Nose without nasal septal hypertrophy Mouth/Parynx benign; Mallinpatti scale 3 Neck: No JVD, no carotid bruits; normal carotid upstroke Lungs: clear to ausculatation and percussion; no wheezing or rales Chest wall: without tenderness to palpitation Heart: PMI not displaced, RRR, s1 s2 normal, 2/6 systolic murmur at the apex to axilla, no diastolic murmur, no rubs, gallops, thrills, or heaves Abdomen: soft, nontender; no hepatosplenomehaly, BS+; abdominal aorta nontender and not dilated by palpation. Back: no CVA tenderness Pulses 2+ Musculoskeletal: full range of motion, normal strength, no joint deformities Extremities: no clubbing cyanosis or edema, Homan's sign negative  Neurologic: grossly nonfocal; Cranial nerves grossly wnl Psychologic: Normal mood and affect   ECG (independently read by me): Normal sinus rhythm at 63 bpm.  Normal intervals.  No significant ST-T changes.  No ectopy.  May 2016 ECG (independently read by me): Sinus bradycardia 54 bpm.  T-wave inversion in lead 05 November 2013 ECG  (independently read by me): Sinus bradycardia at 50 beats per minute. Nonspecific T. change in lead 3. Normal intervals.  Prior ECG from 05/02/2013: Sinus bradycardia 52 beats per minute. Previously noted T wave abnormality inferior laterally.  LABS: BMP Latest Ref Rng & Units 07/20/2016 03/17/2015 01/01/2014  Glucose 65 - 99 mg/dL 100(H) 96 102(H)  BUN 7 - 25 mg/dL _0 Creatinine 0.70 - 1.33 mg/dL 0.95 0.88 0.79  Sodium 135 - 146 mmol/L 139 140 139  Potassium 3.5 - 5.3 mmol/L 3.6 3.7 3.8  Chloride 98 - 110 mmol/L 102 105 104  CO2 20 - 31 mmol/L _1 Calcium 8.6 - 10.3 mg/dL 9.8 9.9 9.5   Hepatic Function  Latest Ref Rng & Units 07/20/2016 03/17/2015 01/01/2014  Total Protein 6.1 - 8.1 g/dL 5.9(L) 6.3 6.5  Albumin 3.6 - 5.1 g/dL 4.1 4.2 4.2  AST 10 - 35 U/L _2 ALT 9 - 46 U/L _3 Alk Phosphatase 40 - 115 U/L 64 59 58  Total Bilirubin 0.2 - 1.2 mg/dL 1.0 1.0 0.6   CBC Latest Ref Rng & Units 03/17/2015 01/01/2014 02/26/2008  WBC 4.0 - 10.5 K/uL 6.6 6.7 5.7  Hemoglobin 13.0 - 17.0 g/dL 15.3 15.1 16.6  Hematocrit 39.0 - 52.0 % 46.0 45.1 48.4  Platelets 150 - 400 K/uL 194 204 213   Lab Results  Component Value Date   TSH 1.606 03/17/2015   Lipid Panel     Component Value Date/Time   CHOL 131 07/20/2016 0909   TRIG 123 07/20/2016 0909   HDL 31 (L) 07/20/2016 0909   CHOLHDL 4.2 07/20/2016 0909   VLDL 25 07/20/2016 0909   LDLCALC 75 07/20/2016 0909      RADIOLOGY: No results found.    ------------------------------------------------------------------- ECHO Study Conclusions: 03/08/2017  - Left ventricle: The cavity size was normal. Wall thickness was   normal. Systolic function was normal. The estimated ejection   fraction was in the range of 60% to 65%. Left ventricular   diastolic function parameters were normal. - Aortic valve: There was trivial regurgitation. - Mitral valve: Myxomatous and thickened valve with bileaflet   prolapse posterior leaflet severe anteriorly directed moderate MR   There was moderate regurgitation. - Atrial septum: No defect or patent foramen ovale was identified.   IMPRESSION: 1. Moderate mitral valve regurgitation   2. Medication management   3. MVP (mitral valve prolapse)   4. Accelerated hypertension   5. OSA (obstructive sleep apnea)      IMPRESSION: 1. Moderate mitral valve regurgitation   2. Medication management   3. MVP (mitral valve prolapse)   4. Accelerated hypertension   5. OSA (obstructive sleep apnea)     ASSESSMENT AND PLAN: Mr. Grieder is a 68 -year-old white male who has documented at least moderate to  moderately severe mitral regurgitation and has moderate holosystolic mitral valve prolapse involving predominantly his P2 component. He has eccentrically directed mitral regurgitation. Pulmonary pressures have been normal in the past. His echo Doppler study in December 2013 continued to show normal pulmonary pressures and an echo in February 2015 was essentially unchanged with  PA pressures were borderline elevated at 31 mm.  Last echocardiographic evaluation was in June 2018 which showed an EF of 60 to 65%.  His mitral valve was myxomatous and thickened and there was bileaflet prolapse and severe anteriorly directed moderate mitral regurgitation.  He apparently had developed significant decelerated hypertension  after inadvertently running out of his medications for blood pressure control over the past year to year and a half.  Recent blood pressure had risen to 172/115.  For the past 5 days he has been back on amlodipine/benazepril 10/40, HCTZ 25 mg, and metoprolol succinate 50 mg.  Blood pressure is significantly improved and on repeat by me was 138/86.  He has not had recent laboratory and I will check a complete set of laboratory including a comprehensive metabolic panel, CBC, TSH and lipid studies.  Presently he is not having any palpitations.  He denies chest pain or presyncope.  Physical exam again suggestive murmur of mitral regurgitation I will obtain a 2-year follow-up echo Doppler study.  He has a diagnosis of obstructive sleep apnea which had been followed by Dr. Annamaria Boots and in the past had been noncompliant with therapy.  I will see him in 6 to 8 weeks for follow-up evaluation or sooner as necessary.  Time spent: 40 minutes  Troy Sine, MD, [facility]  08/06/2019 5:20 PM

## 2019-08-06 ENCOUNTER — Encounter: Payer: Self-pay | Admitting: Cardiovascular Disease

## 2019-08-09 LAB — COMPREHENSIVE METABOLIC PANEL
ALT: 43 IU/L (ref 0–44)
AST: 36 IU/L (ref 0–40)
Albumin/Globulin Ratio: 2 (ref 1.2–2.2)
Albumin: 4.4 g/dL (ref 3.8–4.9)
Alkaline Phosphatase: 63 IU/L (ref 39–117)
BUN/Creatinine Ratio: 15 (ref 9–20)
BUN: 14 mg/dL (ref 6–24)
Bilirubin Total: 0.7 mg/dL (ref 0.0–1.2)
CO2: 27 mmol/L (ref 20–29)
Calcium: 10.5 mg/dL — ABNORMAL HIGH (ref 8.7–10.2)
Chloride: 103 mmol/L (ref 96–106)
Creatinine, Ser: 0.92 mg/dL (ref 0.76–1.27)
GFR calc Af Amer: 106 mL/min/{1.73_m2} (ref >59)
GFR calc non Af Amer: 92 mL/min/{1.73_m2} (ref >59)
Globulin, Total: 2.2 g/dL (ref 1.5–4.5)
Glucose: 95 mg/dL (ref 65–99)
Potassium: 4 mmol/L (ref 3.5–5.2)
Sodium: 145 mmol/L — ABNORMAL HIGH (ref 134–144)
Total Protein: 6.6 g/dL (ref 6.0–8.5)

## 2019-08-09 LAB — LIPID PANEL
Chol/HDL Ratio: 4.2 ratio (ref 0.0–5.0)
Cholesterol, Total: 167 mg/dL (ref 100–199)
HDL: 40 mg/dL (ref >39)
LDL Chol Calc (NIH): 110 mg/dL — ABNORMAL HIGH (ref 0–99)
Triglycerides: 89 mg/dL (ref 0–149)
VLDL Cholesterol Cal: 17 mg/dL (ref 5–40)

## 2019-08-09 LAB — CBC
Hematocrit: 47.1 % (ref 37.5–51.0)
Hemoglobin: 15.5 g/dL (ref 13.0–17.7)
MCH: 29.9 pg (ref 26.6–33.0)
MCHC: 32.9 g/dL (ref 31.5–35.7)
MCV: 91 fL (ref 79–97)
Platelets: 205 10*3/uL (ref 150–450)
RBC: 5.19 x10E6/uL (ref 4.14–5.80)
RDW: 15.9 % — ABNORMAL HIGH (ref 11.6–15.4)
WBC: 7.9 10*3/uL (ref 3.4–10.8)

## 2019-08-09 LAB — TSH: TSH: 1.81 u[IU]/mL (ref 0.450–4.500)

## 2019-08-13 ENCOUNTER — Ambulatory Visit (HOSPITAL_COMMUNITY): Payer: BC Managed Care – PPO | Attending: Internal Medicine

## 2019-08-13 ENCOUNTER — Other Ambulatory Visit: Payer: Self-pay

## 2019-08-13 DIAGNOSIS — I340 Nonrheumatic mitral (valve) insufficiency: Secondary | ICD-10-CM

## 2019-08-15 ENCOUNTER — Telehealth: Payer: Self-pay | Admitting: Cardiovascular Disease

## 2019-08-15 NOTE — Telephone Encounter (Signed)
Notes recorded by Troy Sine, MD on 08/13/2019 at 7:49 PM EST  Normal LV function. Moderate mitral valve prolapse with mild to moderate MR, mild TR, mild to moderate AR.  Pt informed of providers result & recommendations. Pt verbalized understanding. No further questions .

## 2019-08-15 NOTE — Telephone Encounter (Signed)
New Message  Pt is calling back for results   Please call  

## 2019-09-03 ENCOUNTER — Telehealth: Payer: Self-pay | Admitting: Cardiovascular Disease

## 2019-09-03 NOTE — Telephone Encounter (Signed)
Pt updated with lab results and MD's recommendations. Pt report he unsure if he is taking Atorvastatin but will check to make sure. Pt advised if he is not taking it then resume medication and if currently taking, then call office to increase. Pt verbalized understanding.

## 2019-09-03 NOTE — Telephone Encounter (Signed)
Patient returning call in regards to lab results.  

## 2019-09-17 ENCOUNTER — Encounter: Payer: Self-pay | Admitting: Cardiovascular Disease

## 2019-09-17 ENCOUNTER — Other Ambulatory Visit: Payer: Self-pay

## 2019-09-17 ENCOUNTER — Ambulatory Visit (INDEPENDENT_AMBULATORY_CARE_PROVIDER_SITE_OTHER): Payer: BC Managed Care – PPO | Admitting: Cardiovascular Disease

## 2019-09-17 VITALS — BP 139/87 | HR 61 | Temp 97.7°F | Ht 73.0 in | Wt 237.0 lb

## 2019-09-17 DIAGNOSIS — I340 Nonrheumatic mitral (valve) insufficiency: Secondary | ICD-10-CM | POA: Diagnosis not present

## 2019-09-17 DIAGNOSIS — I10 Essential (primary) hypertension: Secondary | ICD-10-CM | POA: Diagnosis not present

## 2019-09-17 DIAGNOSIS — I272 Pulmonary hypertension, unspecified: Secondary | ICD-10-CM

## 2019-09-17 DIAGNOSIS — I351 Nonrheumatic aortic (valve) insufficiency: Secondary | ICD-10-CM

## 2019-09-17 DIAGNOSIS — I341 Nonrheumatic mitral (valve) prolapse: Secondary | ICD-10-CM | POA: Diagnosis not present

## 2019-09-17 DIAGNOSIS — E7800 Pure hypercholesterolemia, unspecified: Secondary | ICD-10-CM

## 2019-09-17 MED ORDER — ATORVASTATIN CALCIUM 20 MG PO TABS: 20.0000 mg | ORAL_TABLET | Freq: Every day | ORAL | 6 refills | Status: DC

## 2019-09-17 NOTE — Progress Notes (Signed)
Patient ID: Danny Norris, male   DOB: 05-31-1961, 58 y.o.   MRN: [id number]      HPI: Danny Norris is a 58 y.o. male who presents for a 2 month follow-up cardiology followup evaluation.   Ms. Danny Norris has a history of moderately severe to severe mitral regurgitation. A TEE evaluation in August 2012  showed normal LV function. He has significant mitral valve prolapse especially involving the P2 segment without clear evidence for ruptured chordae and had eccentric mitral regurgitation anteriorly directed. There was mild dilatation of the sinus Valsalva at 44 mm. Cardiac catheterization in November 2012  showed normal coronary arteries, mitral valve prolapse with 2+ MR and a normal pulmonary pressures. Pulmonary capillary wedge pressure at that time was 15. PA pressure 28/12.  An echo Doppler study in December 2013 revealed moderate holosystolic prolapse with moderate to severe MR directed eccentrically anteriorly. His LA was mildly dilated posterior pressure was normal at 22 mm Hg.  On October 23, 2013, Danny Norris underwent a one-year echo Doppler assessment. This showed an ejection fraction of 55-60% without regional wall motion abnormalities and with normal diastolic function. On this echo he was determined to have moderate prolapse involving the posterior leaflet with moderate regurgitation directed eccentrically anteriorly. His left atrium was felt to be mildly dilated. PA pressure was now approximately 31 mm.  When I last saw him in 2016 he denied any significant change in symptoms and denied any chest pain, exertional dyspnea, PND orthopnea..  He has a history of hypertension, obstructive sleep apnea diagnosed in 2009 by Dr. Annamaria Boots, as well as hyperlipidemia.  He was only intermittently compliant with CPAP use.  He has been on Lotrel 10/40, hydrochlorothiazide 25 mg, and Toprol-XL 50 mg for blood pressure control.  He has been taking atorvastatin 20 mg for his hyperlipidemia in addition to coenzyme  Q10 200 mg capsules.  He was  seen by Bernerd Pho on July 07, 2016 and remained stable.  He was walking 2 to 3 days/week and denied anginal symptoms.  He was only rarely using his CPAP at that time.  I had not seen him since May 2016 saw him for reestablishment of care on August 02, 2019.  Over the past several years, he  continued to work at [employer]  Apparently he believes he may have run out of his medications well over a year and a half ago.  His father had recently noticed this and contacted our office.  Of his blood pressure medication, his blood pressure had risen recently to 172/115.  A short supply of amlodipine/benazepril 10/40, Toprol 50 mg and HCTZ 25 called into his pharmacy.  Was on therapy for approximately a week prior to his office evaluation.  Subsequently underwent laboratory August 08, 2019.  CBC was normal.  Lipid studies revealed total cholesterol 167, triglycerides 89, HDL 40, and LDL 110.  Renal function was stable.  Calcium was borderline increased to 10.5.  TSH was 1.81.  He underwent a follow-up echo Doppler study on August 13, 2019 which continued to show normal systolic function with EF 60 to 65%.  There was moderate left ventricular hypertrophy.  Left atrium was mildly dilated.  There was evidence for moderate mitral valve prolapse with mild to moderate mitral valve regurgitation.  There was mild to moderate aortic insufficiency.  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  Her mildly elevated pulmonary artery systolic pressure.  Over the  past 6 weeks he has remained stable.  He denies chest pain PND orthopnea.  He is now back on his medicines including amlodipine benazepril 10/40 mg, metoprolol succinate 50 mg daily and HCTZ 25 mg.  He had not yet resumed atorvastatin which remotely had been 20 mg.  He presents for evaluation.  Past Medical History:  Diagnosis Date   Dyslipidemia    HTN  (hypertension)    Mitral valve regurgitation    a. 05/2011: TEE showing significant MVP especially involving the P2 segment without evidence for ruptured chordae and had eccentric MR anteriorly directed. b. 03/2015: echo showing EF 65-70% with posterior MV leaflet thickening and prolapse with mild MR.   Normal coronary arteries    a. normal cors by cath in 08/2011    Past Surgical History:  Procedure Laterality Date   [place] Right 2009   Rt inguinal repair   LEFT AND RIGHT HEART CATHETERIZATION WITH CORONARY ANGIOGRAM N/A 09/01/2011   Procedure: LEFT AND RIGHT HEART CATHETERIZATION WITH CORONARY ANGIOGRAM;  Surgeon: Troy Sine, MD;  Location: [facility] CATH LAB;  Service: Cardiovascular;  Laterality: N/A;    Allergies  Allergen Reactions   Contrast Media [Iodinated Diagnostic Agents] Rash    Current Outpatient Medications  Medication Sig Dispense Refill   amLODipine-benazepril (LOTREL) 10-40 MG capsule TAKE 1 CAPSULE BY MOUTH EVERY DAY 30 capsule 6   aspirin 81 MG chewable tablet Chew 81 mg by mouth daily.     atorvastatin (LIPITOR) 20 MG tablet Take 1 tablet (20 mg total) by mouth daily at 6 PM. 30 tablet 6   Coenzyme Q10 (COQ10) 200 MG CAPS Take by mouth daily.     hydrochlorothiazide (HYDRODIURIL) 25 MG tablet Take 1 tablet (25 mg total) by mouth daily. 30 tablet 6   metoprolol succinate (TOPROL-XL) 50 MG 24 hr tablet Take 1 tablet (50 mg total) by mouth daily. Take with or immediately following a meal. 30 tablet 6   No current facility-administered medications for this visit.    Socially he is single. There are no children. He does exercise one to 2 days per week. There is no tobacco or alcohol use.  He works as a Garment/textile technologist at [employer].  Family history is notable that his mother is age 29 and his father is age 41.  His brother age 25 died of an aneurysm in 04/25/2017.  ROS General: Negative; No fevers, chills, or night sweats;    HEENT: Negative; No changes in vision or hearing, sinus congestion, difficulty swallowing Pulmonary: Negative; No cough, wheezing, shortness of breath, hemoptysis Cardiovascular: Negative; No chest pain, presyncope, syncope, palpitations GI: Negative; No nausea, vomiting, diarrhea, or abdominal pain GU: Negative; No dysuria, hematuria, or difficulty voiding Musculoskeletal: Negative; no myalgias, joint pain, or weakness Hematologic/Oncology: Negative; no easy bruising, bleeding Endocrine: Negative; no heat/cold intolerance; no diabetes Neuro: Negative; no changes in balance, headaches Skin: Negative; No rashes or skin lesions Psychiatric: Negative; No behavioral problems, depression Sleep: Negative; No snoring, daytime sleepiness, hypersomnolence, bruxism, restless legs, hypnogognic hallucinations, no cataplexy Other comprehensive 14 point system review is negative.  PE BP 139/87    Pulse 61    Temp 97.7 F (36.5 C)    Ht 6' 1"  (1.854 m)    Wt 237 lb (107.5 kg)    SpO2 97%    BMI 31.27 kg/m    Repeat blood pressure by me was 118/80  Wt Readings from Last 3 Encounters:  09/17/19 237 lb (107.5 kg)  08/02/19 234 lb (106.1 kg)  07/07/16 245 lb (111.1 kg)   General: Alert, oriented, no distress.  Skin: normal turgor, no rashes, warm and dry HEENT: Normocephalic, atraumatic. Pupils equal round and reactive to light; sclera anicteric; extraocular muscles intact;  Nose without nasal septal hypertrophy Mouth/Parynx benign; Mallinpatti scale 3 Neck: No JVD, no carotid bruits; normal carotid upstroke Lungs: clear to ausculatation and percussion; no wheezing or rales Chest wall: without tenderness to palpitation Heart: PMI not displaced, RRR, s1 s2 normal, 16 systolic murmur, 1/6 AR; 2/6 systolicmurmur at apex, , no rubs, gallops, thrills, or heaves Abdomen: soft, nontender; no hepatosplenomehaly, BS+; abdominal aorta nontender and not dilated by palpation. Back: no CVA tenderness Pulses  2+ Musculoskeletal: full range of motion, normal strength, no joint deformities Extremities: no clubbing cyanosis or edema, Homan's sign negative  Neurologic: grossly nonfocal; Cranial nerves grossly wnl Psychologic: Normal mood and affect  ECG (independently read by me): Normal sinus rhythm at 61 bpm.  No ectopy.  Normal intervals.  August 02, 2019 ECG (independently read by me): Normal sinus rhythm at 63 bpm.  Normal intervals.  No significant ST-T changes.  No ectopy.  May 2016 ECG (independently read by me): Sinus bradycardia 54 bpm.  T-wave inversion in lead 05 November 2013 ECG  (independently read by me): Sinus bradycardia at 50 beats per minute. Nonspecific T. change in lead 3. Normal intervals.   ECG from 05/02/2013: Sinus bradycardia 52 beats per minute. Previously noted T wave abnormality inferior laterally.  LABS: BMP Latest Ref Rng & Units 08/08/2019 07/20/2016 03/17/2015  Glucose 65 - 99 mg/dL 95 100(H) 96  BUN 6 - 24 mg/dL 14 12 16   Creatinine 0.76 - 1.27 mg/dL 0.92 0.95 0.88  BUN/Creat Ratio 9 - 20 15 - -  Sodium 134 - 144 mmol/L 145(H) 139 140  Potassium 3.5 - 5.2 mmol/L 4.0 3.6 3.7  Chloride 96 - 106 mmol/L 103 102 105  CO2 20 - 29 mmol/L 27 30 27   Calcium 8.7 - 10.2 mg/dL 10.5(H) 9.8 9.9   Hepatic Function Latest Ref Rng & Units 08/08/2019 07/20/2016 03/17/2015  Total Protein 6.0 - 8.5 g/dL 6.6 5.9(L) 6.3  Albumin 3.8 - 4.9 g/dL 4.4 4.1 4.2  AST 0 - 40 IU/L 36 22 25  ALT 0 - 44 IU/L 43 24 27  Alk Phosphatase 39 - 117 IU/L 63 64 59  Total Bilirubin 0.0 - 1.2 mg/dL 0.7 1.0 1.0   CBC Latest Ref Rng & Units 08/08/2019 03/17/2015 01/01/2014  WBC 3.4 - 10.8 x10E3/uL 7.9 6.6 6.7  Hemoglobin 13.0 - 17.7 g/dL 15.5 15.3 15.1  Hematocrit 37.5 - 51.0 % 47.1 46.0 45.1  Platelets 150 - 450 x10E3/uL 205 194 204   Lab Results  Component Value Date   TSH 1.810 08/08/2019   Lipid Panel     Component Value Date/Time   CHOL 167 08/08/2019 0813   TRIG 89 08/08/2019 0813    HDL 40 08/08/2019 0813   CHOLHDL 4.2 08/08/2019 0813   CHOLHDL 4.2 07/20/2016 0909   VLDL 25 07/20/2016 0909   LDLCALC 110 (H) 08/08/2019 0813      RADIOLOGY: No results found.  CARDIAC STUDIES: ------------------------------------------------------------------- ECHO Study Conclusions: 03/08/2017  - Left ventricle: The cavity size was normal. Wall thickness was   normal. Systolic function was normal. The estimated ejection   fraction was in the range of 60% to 65%. Left ventricular   diastolic function parameters were normal. -  Aortic valve: There was trivial regurgitation. - Mitral valve: Myxomatous and thickened valve with bileaflet   prolapse posterior leaflet severe anteriorly directed moderate MR   There was moderate regurgitation. - Atrial septum: No defect or patent foramen ovale was identified.  ----------------------------------------------------------------------------------- ECHO 08/13/2019 IMPRESSIONS  1. Left ventricular ejection fraction, by visual estimation, is 60 to 65%. The left ventricle has normal function. There is moderately increased left ventricular hypertrophy.  2. Global right ventricle has normal systolic function.The right ventricular size is normal. No increase in right ventricular   wall thickness.  3. Left atrial size was moderately dilated.  4. Right atrial size was normal.  5. Moderate mitral valve prolapse.  6. The mitral valve is abnormal. Mild to moderate mitral valve regurgitation.  7. The tricuspid valve is grossly normal. Tricuspid valve regurgitation is mild.  8. Aortic valve regurgitation is mild to moderate.  9. The aortic valve is tricuspid. Aortic valve regurgitation is mild to moderate. 10. The pulmonic valve was grossly normal. Pulmonic valve regurgitation is not visualized. 11. Aortic dilatation noted. 12. There is moderate dilatation of the ascending aorta and of the aortic root measuring 45 mm. 13. Mildly elevated pulmonary  artery systolic pressure. 14. The inferior vena cava is dilated in size with >50% respiratory variability, suggesting right atrial pressure of 8 mmHg.  In comparison to the previous echocardiogram(s): 03/08/17. EF 60-65%. Moderate MR.  IMPRESSION: 1. MVP (mitral valve prolapse)   2. Moderate mitral valve regurgitation   3. Pure hypercholesterolemia   4. Essential hypertension   5. Mild pulmonary hypertension ([place])   6. Aortic valve insufficiency, etiology of cardiac valve disease unspecified     ASSESSMENT AND PLAN: Danny Norris is a 46 -year-old white male who has documented at least moderate to moderately severe mitral regurgitation and has moderate holosystolic mitral valve prolapse involving predominantly his P2 component. He has eccentrically directed mitral regurgitation. Pulmonary pressures have been normal in the past. His echo Doppler study in December 2013 continued to show normal pulmonary pressures and an echo in February 2015 was essentially unchanged with  PA pressures were borderline elevated at 31 mm.  Echo in June 2018  showed an EF of 60 to 65%.  His mitral valve was myxomatous and thickened and there was bileaflet prolapse and severe anteriorly directed moderate mitral regurgitation.  He  developed significant accelerated hypertension after inadvertently running out of his medications for blood pressure control over the past year to year and a half.  Recent blood pressure had risen to 172/115.  At my evaluation on August 02, 2019 he had been back on medication for 5 days with amlodipine/benazepril 10/40, HCTZ 25 mg, and metoprolol succinate 50 mg.  Blood pressure was significantly improved and on repeat by me was 138/86.  Blood pressure today, remained stable and on repeat by me was 118/80.  I reviewed his laboratory in detail.  His most recent LDL cholesterol was 110.  I have recommended resumption of atorvastatin 20 mg daily.  I reviewed his most recent echo Doppler study from  August 13, 2019 which again shows EF stable at 60 to 65%, moderate mitral valve prolapse, mild to moderate mitral regurgitation, and now shows mild to moderate aortic insufficiency.  Left atrium size was moderately dilated.  He did have mild dilation of his ascending aorta and aortic root measuring 45 mm.  Estimated PA pressure was 39 mm.  He currently remains asymptomatic.  He denies chest pain.  There is no exertional dyspnea.  We will monitor his blood pressure.  In 3 months after reinitiating atorvastatin we will check CMP P as well as lipid panel.  In 6 months I will see him for follow-up office evaluation.  Time spent: 25 minutes  Troy Sine, MD, [facility]  09/17/2019 12:15 PM

## 2019-09-17 NOTE — Patient Instructions (Signed)
Medication Instructions:  RESTART Lipitor 20mg  Take 1 tablet once a day  *If you need a refill on your cardiac medications before your next appointment, please call your pharmacy*  Lab Work: Your physician recommends that you return for lab work in: 3 MONTHS FASTING-CMET, LIPID If you have labs (blood work) drawn today and your tests are completely normal, you will receive your results only by: Marland Kitchen MyChart Message (if you have MyChart) OR . A paper copy in the mail If you have any lab test that is abnormal or we need to change your treatment, we will call you to review the results.  Testing/Procedures: NONE   Follow-Up: At [facility], you and your health needs are our priority.  As part of our continuing mission to provide you with exceptional heart care, we have created designated Provider Care Teams.  These Care Teams include your primary Cardiologist (physician) and Advanced Practice Providers (APPs -  Physician Assistants and Nurse Practitioners) who all work together to provide you with the care you need, when you need it.  Your next appointment:   6 month(s)  The format for your next appointment:   In Person  Provider:   Shelva Majestic, MD  Other Instructions

## 2019-12-24 LAB — LIPID PANEL
Chol/HDL Ratio: 3.6 ratio (ref 0.0–5.0)
Cholesterol, Total: 132 mg/dL (ref 100–199)
HDL: 37 mg/dL — ABNORMAL LOW (ref >39)
LDL Chol Calc (NIH): 79 mg/dL (ref 0–99)
Triglycerides: 81 mg/dL (ref 0–149)
VLDL Cholesterol Cal: 16 mg/dL (ref 5–40)

## 2019-12-24 LAB — COMPREHENSIVE METABOLIC PANEL
ALT: 18 IU/L (ref 0–44)
AST: 20 IU/L (ref 0–40)
Albumin/Globulin Ratio: 2.3 — ABNORMAL HIGH (ref 1.2–2.2)
Albumin: 4.2 g/dL (ref 3.8–4.9)
Alkaline Phosphatase: 68 IU/L (ref 39–117)
BUN/Creatinine Ratio: 13 (ref 9–20)
BUN: 11 mg/dL (ref 6–24)
Bilirubin Total: 0.5 mg/dL (ref 0.0–1.2)
CO2: 28 mmol/L (ref 20–29)
Calcium: 10.1 mg/dL (ref 8.7–10.2)
Chloride: 101 mmol/L (ref 96–106)
Creatinine, Ser: 0.82 mg/dL (ref 0.76–1.27)
GFR calc Af Amer: 113 mL/min/{1.73_m2} (ref >59)
GFR calc non Af Amer: 97 mL/min/{1.73_m2} (ref >59)
Globulin, Total: 1.8 g/dL (ref 1.5–4.5)
Glucose: 99 mg/dL (ref 65–99)
Potassium: 4 mmol/L (ref 3.5–5.2)
Sodium: 143 mmol/L (ref 134–144)
Total Protein: 6 g/dL (ref 6.0–8.5)

## 2020-03-25 ENCOUNTER — Other Ambulatory Visit: Payer: Self-pay | Admitting: Cardiovascular Disease

## 2020-03-26 ENCOUNTER — Telehealth: Payer: Self-pay | Admitting: Cardiovascular Disease

## 2020-03-26 NOTE — Telephone Encounter (Signed)
Called patient 03/26/20 to schedule appt from patients recall list, no answer, left message for patient to return call to get appt scheduled

## 2021-02-19 ENCOUNTER — Inpatient Hospital Stay (HOSPITAL_COMMUNITY): Payer: BC Managed Care – PPO

## 2021-02-19 ENCOUNTER — Inpatient Hospital Stay (HOSPITAL_COMMUNITY)
Admission: EM | Admit: 2021-02-19 | Discharge: 2021-02-28 | DRG: 871 | Disposition: A | Discharge status: Home / Self Care | Payer: BC Managed Care – PPO | Attending: Internal Medicine | Admitting: Internal Medicine

## 2021-02-19 ENCOUNTER — Emergency Department (HOSPITAL_COMMUNITY): Payer: BC Managed Care – PPO

## 2021-02-19 ENCOUNTER — Other Ambulatory Visit: Payer: Self-pay

## 2021-02-19 ENCOUNTER — Encounter (HOSPITAL_COMMUNITY): Payer: Self-pay

## 2021-02-19 DIAGNOSIS — E785 Hyperlipidemia, unspecified: Secondary | ICD-10-CM | POA: Diagnosis present

## 2021-02-19 DIAGNOSIS — R6521 Severe sepsis with septic shock: Secondary | ICD-10-CM | POA: Diagnosis present

## 2021-02-19 DIAGNOSIS — R0902 Hypoxemia: Secondary | ICD-10-CM | POA: Diagnosis not present

## 2021-02-19 DIAGNOSIS — R739 Hyperglycemia, unspecified: Secondary | ICD-10-CM | POA: Diagnosis present

## 2021-02-19 DIAGNOSIS — K5900 Constipation, unspecified: Secondary | ICD-10-CM | POA: Diagnosis present

## 2021-02-19 DIAGNOSIS — E873 Alkalosis: Secondary | ICD-10-CM | POA: Diagnosis present

## 2021-02-19 DIAGNOSIS — Z4659 Encounter for fitting and adjustment of other gastrointestinal appliance and device: Secondary | ICD-10-CM

## 2021-02-19 DIAGNOSIS — G4733 Obstructive sleep apnea (adult) (pediatric): Secondary | ICD-10-CM | POA: Diagnosis present

## 2021-02-19 DIAGNOSIS — I82453 Acute embolism and thrombosis of peroneal vein, bilateral: Secondary | ICD-10-CM

## 2021-02-19 DIAGNOSIS — Z20822 Contact with and (suspected) exposure to covid-19: Secondary | ICD-10-CM | POA: Diagnosis present

## 2021-02-19 DIAGNOSIS — I10 Essential (primary) hypertension: Secondary | ICD-10-CM | POA: Diagnosis present

## 2021-02-19 DIAGNOSIS — Z6833 Body mass index (BMI) 33.0-33.9, adult: Secondary | ICD-10-CM

## 2021-02-19 DIAGNOSIS — J9600 Acute respiratory failure, unspecified whether with hypoxia or hypercapnia: Secondary | ICD-10-CM | POA: Diagnosis present

## 2021-02-19 DIAGNOSIS — E861 Hypovolemia: Secondary | ICD-10-CM | POA: Diagnosis present

## 2021-02-19 DIAGNOSIS — J986 Disorders of diaphragm: Secondary | ICD-10-CM | POA: Diagnosis not present

## 2021-02-19 DIAGNOSIS — Z9119 Patient's noncompliance with other medical treatment and regimen: Secondary | ICD-10-CM

## 2021-02-19 DIAGNOSIS — I82442 Acute embolism and thrombosis of left tibial vein: Secondary | ICD-10-CM | POA: Diagnosis present

## 2021-02-19 DIAGNOSIS — R0602 Shortness of breath: Secondary | ICD-10-CM | POA: Diagnosis present

## 2021-02-19 DIAGNOSIS — J9601 Acute respiratory failure with hypoxia: Secondary | ICD-10-CM | POA: Diagnosis not present

## 2021-02-19 DIAGNOSIS — E870 Hyperosmolality and hypernatremia: Secondary | ICD-10-CM | POA: Diagnosis present

## 2021-02-19 DIAGNOSIS — Z28310 Unvaccinated for covid-19: Secondary | ICD-10-CM | POA: Diagnosis not present

## 2021-02-19 DIAGNOSIS — I169 Hypertensive crisis, unspecified: Secondary | ICD-10-CM | POA: Diagnosis not present

## 2021-02-19 DIAGNOSIS — Z79899 Other long term (current) drug therapy: Secondary | ICD-10-CM

## 2021-02-19 DIAGNOSIS — R339 Retention of urine, unspecified: Secondary | ICD-10-CM | POA: Diagnosis present

## 2021-02-19 DIAGNOSIS — Z91041 Radiographic dye allergy status: Secondary | ICD-10-CM

## 2021-02-19 DIAGNOSIS — I272 Pulmonary hypertension, unspecified: Secondary | ICD-10-CM | POA: Diagnosis present

## 2021-02-19 DIAGNOSIS — E876 Hypokalemia: Secondary | ICD-10-CM | POA: Diagnosis present

## 2021-02-19 DIAGNOSIS — Z7982 Long term (current) use of aspirin: Secondary | ICD-10-CM

## 2021-02-19 DIAGNOSIS — R0603 Acute respiratory distress: Secondary | ICD-10-CM | POA: Diagnosis not present

## 2021-02-19 DIAGNOSIS — A419 Sepsis, unspecified organism: Principal | ICD-10-CM | POA: Diagnosis present

## 2021-02-19 DIAGNOSIS — J189 Pneumonia, unspecified organism: Secondary | ICD-10-CM

## 2021-02-19 LAB — HIV ANTIBODY (ROUTINE TESTING W REFLEX): HIV Screen 4th Generation wRfx: NONREACTIVE

## 2021-02-19 LAB — CBC WITH DIFFERENTIAL/PLATELET
Abs Immature Granulocytes: 0.03 10*3/uL (ref 0.00–0.07)
Basophils Absolute: 0 10*3/uL (ref 0.0–0.1)
Basophils Relative: 0 %
Eosinophils Absolute: 0 10*3/uL (ref 0.0–0.5)
Eosinophils Relative: 0 %
HCT: 58.7 % — ABNORMAL HIGH (ref 39.0–52.0)
Hemoglobin: 17.6 g/dL — ABNORMAL HIGH (ref 13.0–17.0)
Immature Granulocytes: 0 %
Lymphocytes Relative: 14 %
Lymphs Abs: 1.4 10*3/uL (ref 0.7–4.0)
MCH: 30.6 pg (ref 26.0–34.0)
MCHC: 30 g/dL (ref 30.0–36.0)
MCV: 102.1 fL — ABNORMAL HIGH (ref 80.0–100.0)
Monocytes Absolute: 0.6 10*3/uL (ref 0.1–1.0)
Monocytes Relative: 6 %
Neutro Abs: 7.7 10*3/uL (ref 1.7–7.7)
Neutrophils Relative %: 80 %
Platelets: 234 10*3/uL (ref 150–400)
RBC: 5.75 MIL/uL (ref 4.22–5.81)
RDW: 15.1 % (ref 11.5–15.5)
WBC: 9.8 10*3/uL (ref 4.0–10.5)
nRBC: 0 % (ref 0.0–0.2)

## 2021-02-19 LAB — I-STAT ARTERIAL BLOOD GAS, ED
Acid-Base Excess: 7 mmol/L — ABNORMAL HIGH (ref 0.0–2.0)
Bicarbonate: 34 mmol/L — ABNORMAL HIGH (ref 20.0–28.0)
Calcium, Ion: 1.35 mmol/L (ref 1.15–1.40)
HCT: 47 % (ref 39.0–52.0)
Hemoglobin: 16 g/dL (ref 13.0–17.0)
O2 Saturation: 99 %
Patient temperature: 99.7
Potassium: 3.6 mmol/L (ref 3.5–5.1)
Sodium: 146 mmol/L — ABNORMAL HIGH (ref 135–145)
TCO2: 36 mmol/L — ABNORMAL HIGH (ref 22–32)
pCO2 arterial: 57.5 mmHg — ABNORMAL HIGH (ref 32.0–48.0)
pH, Arterial: 7.383 (ref 7.350–7.450)
pO2, Arterial: 157 mmHg — ABNORMAL HIGH (ref 83.0–108.0)

## 2021-02-19 LAB — HEMOGLOBIN A1C
Hgb A1c MFr Bld: 6.3 % — ABNORMAL HIGH (ref 4.8–5.6)
Mean Plasma Glucose: 134.11 mg/dL

## 2021-02-19 LAB — RESP PANEL BY RT-PCR (FLU A&B, COVID) ARPGX2
Influenza A by PCR: NEGATIVE
Influenza B by PCR: NEGATIVE
SARS Coronavirus 2 by RT PCR: NEGATIVE

## 2021-02-19 LAB — COMPREHENSIVE METABOLIC PANEL
ALT: 71 U/L — ABNORMAL HIGH (ref 0–44)
AST: 79 U/L — ABNORMAL HIGH (ref 15–41)
Albumin: 4.2 g/dL (ref 3.5–5.0)
Alkaline Phosphatase: 64 U/L (ref 38–126)
Anion gap: 4 — ABNORMAL LOW (ref 5–15)
BUN: 20 mg/dL (ref 6–20)
CO2: 36 mmol/L — ABNORMAL HIGH (ref 22–32)
Calcium: 10.6 mg/dL — ABNORMAL HIGH (ref 8.9–10.3)
Chloride: 105 mmol/L (ref 98–111)
Creatinine, Ser: 1.16 mg/dL (ref 0.61–1.24)
GFR, Estimated: >60 mL/min (ref >60)
Glucose, Bld: 150 mg/dL — ABNORMAL HIGH (ref 70–99)
Potassium: 3.8 mmol/L (ref 3.5–5.1)
Sodium: 145 mmol/L (ref 135–145)
Total Bilirubin: 0.7 mg/dL (ref 0.3–1.2)
Total Protein: 6.8 g/dL (ref 6.5–8.1)

## 2021-02-19 LAB — LACTIC ACID, PLASMA
Lactic Acid, Venous: 1.2 mmol/L (ref 0.5–1.9)
Lactic Acid, Venous: 2.2 mmol/L (ref 0.5–1.9)

## 2021-02-19 LAB — D-DIMER, QUANTITATIVE: D-Dimer, Quant: 2.62 ug/mL-FEU — ABNORMAL HIGH (ref 0.00–0.50)

## 2021-02-19 LAB — CBG MONITORING, ED: Glucose-Capillary: 118 mg/dL — ABNORMAL HIGH (ref 70–99)

## 2021-02-19 LAB — BRAIN NATRIURETIC PEPTIDE: B Natriuretic Peptide: 206.9 pg/mL — ABNORMAL HIGH (ref 0.0–100.0)

## 2021-02-19 LAB — MAGNESIUM: Magnesium: 2.4 mg/dL (ref 1.7–2.4)

## 2021-02-19 LAB — GLUCOSE, CAPILLARY
Glucose-Capillary: 111 mg/dL — ABNORMAL HIGH (ref 70–99)
Glucose-Capillary: 93 mg/dL (ref 70–99)
Glucose-Capillary: 107 mg/dL — ABNORMAL HIGH (ref 70–99)

## 2021-02-19 LAB — TROPONIN I (HIGH SENSITIVITY)
Troponin I (High Sensitivity): 36 ng/L — ABNORMAL HIGH (ref <18)
Troponin I (High Sensitivity): 41 ng/L — ABNORMAL HIGH (ref <18)

## 2021-02-19 LAB — STREP PNEUMONIAE URINARY ANTIGEN: Strep Pneumo Urinary Antigen: NEGATIVE

## 2021-02-19 LAB — PROCALCITONIN: Procalcitonin: 0.14 ng/mL

## 2021-02-19 LAB — PHOSPHORUS: Phosphorus: 4.3 mg/dL (ref 2.5–4.6)

## 2021-02-19 LAB — MRSA PCR SCREENING: MRSA by PCR: NEGATIVE

## 2021-02-19 MED ORDER — CEFTRIAXONE SODIUM 2 G IJ SOLR
2.0000 g | INTRAMUSCULAR | Status: AC
Start: 2021-02-20 — End: 2021-02-25
  Administered 2021-02-20 – 2021-02-25 (×6): 2 g via INTRAVENOUS
  Filled 2021-02-19 (×6): qty 20

## 2021-02-19 MED ORDER — DOCUSATE SODIUM 50 MG/5ML PO LIQD: 100.0000 mg | Freq: Two times a day (BID) | ORAL | Status: DC

## 2021-02-19 MED ORDER — LACTATED RINGERS IV SOLN: INTRAVENOUS | Status: DC

## 2021-02-19 MED ORDER — FENTANYL CITRATE (PF) 100 MCG/2ML IJ SOLN
50.0000 ug | INTRAMUSCULAR | Status: DC | PRN
  Filled 2021-02-19: qty 2

## 2021-02-19 MED ORDER — SODIUM CHLORIDE 0.9 % IV SOLN
1.0000 g | Freq: Once | INTRAVENOUS | Status: AC
  Administered 2021-02-19: 1 g via INTRAVENOUS
  Filled 2021-02-19: qty 10

## 2021-02-19 MED ORDER — NOREPINEPHRINE 4 MG/250ML-% IV SOLN
2.0000 ug/min | INTRAVENOUS | Status: DC
  Administered 2021-02-19: 6 ug/min via INTRAVENOUS
  Administered 2021-02-20: 2 ug/min via INTRAVENOUS
  Filled 2021-02-19: qty 250

## 2021-02-19 MED ORDER — PROPOFOL 1000 MG/100ML IV EMUL: 0.0000 ug/kg/min | INTRAVENOUS | Status: DC

## 2021-02-19 MED ORDER — HEPARIN SODIUM (PORCINE) 5000 UNIT/ML IJ SOLN
5000.0000 [IU] | Freq: Three times a day (TID) | INTRAMUSCULAR | Status: DC
  Administered 2021-02-19 – 2021-02-20 (×2): 5000 [IU] via SUBCUTANEOUS
  Filled 2021-02-19 (×2): qty 1

## 2021-02-19 MED ORDER — FENTANYL CITRATE (PF) 100 MCG/2ML IJ SOLN
50.0000 ug | INTRAMUSCULAR | Status: DC | PRN
  Administered 2021-02-19: 100 ug via INTRAVENOUS

## 2021-02-19 MED ORDER — FENTANYL BOLUS VIA INFUSION
50.0000 ug | INTRAVENOUS | Status: DC | PRN
  Administered 2021-02-19: 50 ug via INTRAVENOUS
  Filled 2021-02-19: qty 100

## 2021-02-19 MED ORDER — FENTANYL CITRATE (PF) 100 MCG/2ML IJ SOLN
INTRAMUSCULAR | Status: AC
  Administered 2021-02-19: 50 ug via INTRAVENOUS
  Filled 2021-02-19: qty 2

## 2021-02-19 MED ORDER — DOCUSATE SODIUM 50 MG/5ML PO LIQD
100.0000 mg | Freq: Two times a day (BID) | ORAL | Status: DC
  Administered 2021-02-19 – 2021-02-23 (×8): 100 mg
  Filled 2021-02-19 (×9): qty 10

## 2021-02-19 MED ORDER — FENTANYL BOLUS VIA INFUSION
50.0000 ug | INTRAVENOUS | Status: DC | PRN
  Administered 2021-02-19 – 2021-02-20 (×3): 50 ug via INTRAVENOUS
  Filled 2021-02-19: qty 100

## 2021-02-19 MED ORDER — PROPOFOL 1000 MG/100ML IV EMUL
INTRAVENOUS | Status: AC
  Filled 2021-02-19: qty 100

## 2021-02-19 MED ORDER — SODIUM CHLORIDE 0.9 % IV SOLN
250.0000 mL | INTRAVENOUS | Status: DC
  Administered 2021-02-19 – 2021-02-24 (×3): 250 mL via INTRAVENOUS

## 2021-02-19 MED ORDER — CHLORHEXIDINE GLUCONATE CLOTH 2 % EX PADS
6.0000 | MEDICATED_PAD | Freq: Every day | CUTANEOUS | Status: DC
  Administered 2021-02-19 – 2021-02-26 (×7): 6 via TOPICAL

## 2021-02-19 MED ORDER — POLYETHYLENE GLYCOL 3350 17 G PO PACK
17.0000 g | PACK | Freq: Every day | ORAL | Status: DC
  Administered 2021-02-20 – 2021-02-23 (×4): 17 g
  Filled 2021-02-19 (×5): qty 1

## 2021-02-19 MED ORDER — FENTANYL CITRATE (PF) 100 MCG/2ML IJ SOLN: 50.0000 ug | Freq: Once | INTRAMUSCULAR | Status: DC

## 2021-02-19 MED ORDER — ETOMIDATE 2 MG/ML IV SOLN
INTRAVENOUS | Status: AC | PRN
  Administered 2021-02-19: 30 mg via INTRAVENOUS

## 2021-02-19 MED ORDER — ASPIRIN 81 MG PO CHEW
81.0000 mg | CHEWABLE_TABLET | Freq: Every day | ORAL | Status: DC
  Administered 2021-02-20 – 2021-02-23 (×4): 81 mg
  Filled 2021-02-19 (×4): qty 1

## 2021-02-19 MED ORDER — CHLORHEXIDINE GLUCONATE 0.12% ORAL RINSE (MEDLINE KIT)
15.0000 mL | Freq: Two times a day (BID) | OROMUCOSAL | Status: DC
  Administered 2021-02-19 – 2021-02-23 (×8): 15 mL via OROMUCOSAL

## 2021-02-19 MED ORDER — SODIUM CHLORIDE 0.9 % IV SOLN
500.0000 mg | INTRAVENOUS | Status: DC
  Administered 2021-02-20 – 2021-02-23 (×4): 500 mg via INTRAVENOUS
  Filled 2021-02-19 (×4): qty 500

## 2021-02-19 MED ORDER — SODIUM CHLORIDE 0.9 % IV BOLUS
1000.0000 mL | Freq: Once | INTRAVENOUS | Status: AC
  Administered 2021-02-19: 1000 mL via INTRAVENOUS

## 2021-02-19 MED ORDER — FENTANYL 2500MCG IN NS 250ML (10MCG/ML) PREMIX INFUSION
50.0000 ug/h | INTRAVENOUS | Status: DC
  Administered 2021-02-19: 50 ug/h via INTRAVENOUS
  Administered 2021-02-20: 100 ug/h via INTRAVENOUS
  Administered 2021-02-21: 125 ug/h via INTRAVENOUS
  Administered 2021-02-21: 175 ug/h via INTRAVENOUS
  Filled 2021-02-19 (×4): qty 250

## 2021-02-19 MED ORDER — MIDAZOLAM HCL 2 MG/2ML IJ SOLN
2.0000 mg | Freq: Once | INTRAMUSCULAR | Status: AC
  Administered 2021-02-19: 2 mg via INTRAVENOUS
  Filled 2021-02-19: qty 2

## 2021-02-19 MED ORDER — PANTOPRAZOLE SODIUM 40 MG IV SOLR
40.0000 mg | Freq: Every day | INTRAVENOUS | Status: DC
  Administered 2021-02-19 – 2021-02-22 (×4): 40 mg via INTRAVENOUS
  Filled 2021-02-19 (×4): qty 40

## 2021-02-19 MED ORDER — FENTANYL CITRATE (PF) 100 MCG/2ML IJ SOLN
50.0000 ug | Freq: Once | INTRAMUSCULAR | Status: AC
  Administered 2021-02-19: 50 ug via INTRAVENOUS
  Filled 2021-02-19: qty 2

## 2021-02-19 MED ORDER — ROCURONIUM BROMIDE 50 MG/5ML IV SOLN
INTRAVENOUS | Status: AC | PRN
  Administered 2021-02-19: 100 mg via INTRAVENOUS

## 2021-02-19 MED ORDER — VANCOMYCIN HCL 10 G IV SOLR
2250.0000 mg | Freq: Once | INTRAVENOUS | Status: AC
  Administered 2021-02-19: 2250 mg via INTRAVENOUS
  Filled 2021-02-19: qty 2000

## 2021-02-19 MED ORDER — ORAL CARE MOUTH RINSE
15.0000 mL | OROMUCOSAL | Status: DC
  Administered 2021-02-19 – 2021-02-23 (×38): 15 mL via OROMUCOSAL

## 2021-02-19 MED ORDER — POLYETHYLENE GLYCOL 3350 17 G PO PACK: 17.0000 g | PACK | Freq: Every day | ORAL | Status: DC

## 2021-02-19 MED ORDER — MIDAZOLAM HCL 2 MG/2ML IJ SOLN: 2.0000 mg | INTRAMUSCULAR | Status: DC | PRN

## 2021-02-19 MED ORDER — MIDAZOLAM HCL 2 MG/2ML IJ SOLN
2.0000 mg | INTRAMUSCULAR | Status: DC | PRN
  Administered 2021-02-19 (×2): 2 mg via INTRAVENOUS
  Filled 2021-02-19 (×2): qty 2

## 2021-02-19 MED ORDER — VANCOMYCIN HCL 1000 MG/200ML IV SOLN
1000.0000 mg | Freq: Two times a day (BID) | INTRAVENOUS | Status: DC
  Administered 2021-02-20 – 2021-02-22 (×5): 1000 mg via INTRAVENOUS
  Filled 2021-02-19 (×5): qty 200

## 2021-02-19 MED ORDER — LIDOCAINE HCL URETHRAL/MUCOSAL 2 % EX GEL
1.0000 "application " | Freq: Once | CUTANEOUS | Status: AC
  Administered 2021-02-20: 1 via URETHRAL
  Filled 2021-02-19: qty 11

## 2021-02-19 MED ORDER — FENTANYL 2500MCG IN NS 250ML (10MCG/ML) PREMIX INFUSION
50.0000 ug/h | INTRAVENOUS | Status: DC
  Filled 2021-02-19: qty 250

## 2021-02-19 MED ORDER — ATORVASTATIN CALCIUM 10 MG PO TABS
20.0000 mg | ORAL_TABLET | Freq: Every day | ORAL | Status: DC
  Administered 2021-02-20 – 2021-02-22 (×3): 20 mg
  Filled 2021-02-19 (×3): qty 2

## 2021-02-19 MED ORDER — MIDAZOLAM HCL 2 MG/2ML IJ SOLN
2.0000 mg | INTRAMUSCULAR | Status: DC | PRN
  Administered 2021-02-20 – 2021-02-22 (×7): 2 mg via INTRAVENOUS
  Filled 2021-02-19 (×5): qty 2

## 2021-02-19 MED ORDER — MIDAZOLAM HCL 2 MG/2ML IJ SOLN
2.0000 mg | INTRAMUSCULAR | Status: DC | PRN
  Administered 2021-02-21 (×2): 2 mg via INTRAVENOUS
  Filled 2021-02-19 (×4): qty 2

## 2021-02-19 MED ORDER — NOREPINEPHRINE 4 MG/250ML-% IV SOLN
0.0000 ug/min | INTRAVENOUS | Status: DC
  Administered 2021-02-19: 10 ug/min via INTRAVENOUS

## 2021-02-19 MED ORDER — SODIUM CHLORIDE 0.9 % IV SOLN
500.0000 mg | Freq: Once | INTRAVENOUS | Status: AC
  Administered 2021-02-19: 500 mg via INTRAVENOUS
  Filled 2021-02-19: qty 500

## 2021-02-19 MED ORDER — INSULIN ASPART 100 UNIT/ML IJ SOLN
0.0000 [IU] | INTRAMUSCULAR | Status: DC
  Administered 2021-02-22: 1 [IU] via SUBCUTANEOUS
  Administered 2021-02-23: 2 [IU] via SUBCUTANEOUS
  Administered 2021-02-24: 1 [IU] via SUBCUTANEOUS

## 2021-02-19 MED ORDER — LACTATED RINGERS IV BOLUS
2000.0000 mL | Freq: Once | INTRAVENOUS | Status: AC
  Administered 2021-02-19: 2000 mL via INTRAVENOUS

## 2021-02-19 NOTE — ED Triage Notes (Signed)
Pt BIB [facility] from home c/o generalized weakness for 2 weeks, and SHOB. Pt was in the 60's on RA. Pt is unvaccinated for COVID. Pt denies pain but states he has a mild headache.

## 2021-02-19 NOTE — H&P (Signed)
NAME:  Danny Norris, MRN:  [id number], DOB:  27-Jan-1961, LOS: 0 ADMISSION DATE:  02/19/2021, CONSULTATION DATE:  5/20 REFERRING MD:  Stevie Kern, CHIEF COMPLAINT:  5/20   History of Present Illness:  This is a 60 year old male w/ hx as per below. Presented to the ED on 5/10 in acute respiratory distress, room air sats 60s. Oriented on arrival. Reported about 2 wk h/o generalized weakness, HAs, myalgia, w/ body aches and chills. He is NOT vaccinated against COVID. Over the week prior to presentation got progressively short of breath. While in ER went from 60% room air sats on arrival, initially stabilized on high flow oxygen then acutely became diaphoretic, stopped breathing and was urgently intubated. Pulm/critical care asked to admit   Pertinent  Medical History  MVP and MVR(w/ nml CAs), HTN, hyperlipidemia. 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 Hypoxic respiratory failure on arrival. Following what sounded like ~ 2 week viral prodrome. PCXR R>L airspace disease and RLL ATX. Intubated after failing high flow and progressing to resp arrest. Cultures sent. Azith, rocephin & vanc started. COVID & influenza PCR neg   Interim History / Subjective:  Sedated on vent   Objective   Blood pressure (Abnormal) 195/101, pulse (Abnormal) 134, temperature 99.7 F (37.6 C), temperature source Oral, resp. rate (Abnormal) 34, height 6' (1.829 m), SpO2 94 %.       No intake or output data in the 24 hours ending 02/19/21 1040 There were no vitals filed for this visit.  Examination: General: obese 60 year old white male now sedated on vent  HENT: NCAT orally intubated. MMM Lungs: diffuse rhonchi.  Cardiovascular: RRR soft systolic murmur Abdomen: obese and soft Extremities: LE edema bilaterally brisk CR strong pulses Neuro: sedated  GU: cl yellow   Labs/imaging that I  havepersonally reviewed  (right click and "Reselect all SmartList Selections" daily)  See below   Resolved Hospital Problem list     Assessment & Plan:   Acute Hypoxic respiratory failure (POA) secondary to CAP (NOS to date).  PCXR w/ right > left airspace disease and elevated right HD. This is following what sounded like viral prodrome but at this point either bacterial or viral on ddx  Plan Full vent support F/u post-intubation abg Respiratory culture  Send RVP and f/p resp panel  Started on ceftriaxone and azith, start vanc  Am cxr  Circulatory shock. Developed post-intubation. Multifactorial:septic shock, hypovolemia and medication. Need to also consider alternative dx such as PE Plan 79ml/kg fluid challenge Holding his antihypertensives and diuretic abx as above Will get LE dopplers and send ddimer   Hyperglycemia Plan Ck a1c ssi   H/o hypertension, MVP w/ MVR, mild Pulm hypertension and hyperlipidemia Plan Holding lopressor, Lotrel, and HCTZ Cont Lipitor    Best practice (right click and "Reselect all SmartList Selections" daily)  Diet:  NPO Pain/Anxiety/Delirium protocol (if indicated): Yes (RASS goal -1,-2) VAP protocol (if indicated): Yes DVT prophylaxis: LMWH GI prophylaxis: PPI Glucose control:  SSI Yes Central venous access:  N/A Arterial line:  N/A Foley:  N/A Mobility:  bed rest  PT consulted: N/A Last date of multidisciplinary goals of care discussion [pending] Code Status:  full code Disposition: to ICU critically ill   Labs   CBC: No results for input(s): WBC, NEUTROABS, HGB, HCT, MCV, PLT in the last 168 hours.  Basic Metabolic  Panel: No results for input(s): NA, K, CL, CO2, GLUCOSE, BUN, CREATININE, CALCIUM, MG, PHOS in the last 168 hours. GFR: CrCl cannot be calculated (Patient's most recent lab result is older than the maximum 21 days allowed.). No results for input(s): PROCALCITON, WBC, LATICACIDVEN in the last 168 hours.  Liver  Function Tests: No results for input(s): AST, ALT, ALKPHOS, BILITOT, PROT, ALBUMIN in the last 168 hours. No results for input(s): LIPASE, AMYLASE in the last 168 hours. No results for input(s): AMMONIA in the last 168 hours.  ABG    Component Value Date/Time   PHART 7.369 09/01/2011 1010   PCO2ART 46.1 (H) 09/01/2011 1010   PO2ART 60.0 (L) 09/01/2011 1010   HCO3 26.5 (H) 09/01/2011 1010   TCO2 28 09/01/2011 1010   ACIDBASEDEF 1.0 09/01/2011 1008   O2SAT 90.0 09/01/2011 1010     Coagulation Profile: No results for input(s): INR, PROTIME in the last 168 hours.  Cardiac Enzymes: No results for input(s): CKTOTAL, CKMB, CKMBINDEX, TROPONINI in the last 168 hours.  HbA1C: No results found for: HGBA1C  CBG: No results for input(s): GLUCAP in the last 168 hours.  Review of Systems:   Unable   Past Medical History:  He,  has a past medical history of Dyslipidemia, HTN (hypertension), Mitral valve regurgitation, and Normal coronary arteries.   Surgical History:   Past Surgical History:  Procedure Laterality Date  . EYE SURGERY  1975  . HERNIA REPAIR Right 2009   Rt inguinal repair  . LEFT AND RIGHT HEART CATHETERIZATION WITH CORONARY ANGIOGRAM N/A 09/01/2011   Procedure: LEFT AND RIGHT HEART CATHETERIZATION WITH CORONARY ANGIOGRAM;  Surgeon: Lennette Bihari, MD;  Location: [facility] CATH LAB;  Service: Cardiovascular;  Laterality: N/A;     Social History:   reports that he has never smoked. He has never used smokeless tobacco. He reports that he does not drink alcohol.   Family History:  His family history includes Congestive Heart Failure in his mother; Sleep apnea in his brother.   Allergies Allergies  Allergen Reactions  . Contrast Media [Iodinated Diagnostic Agents] Rash     Home Medications  Prior to Admission medications   Medication Sig Start Date End Date Taking? Authorizing Provider  amLODipine-benazepril (LOTREL) 10-40 MG capsule TAKE 1 CAPSULE BY MOUTH EVERY DAY  03/25/20   Lennette Bihari, MD  aspirin 81 MG chewable tablet Chew 81 mg by mouth daily.    [provider]  atorvastatin (LIPITOR) 20 MG tablet Take 1 tablet (20 mg total) by mouth daily at 6 PM. 09/17/19   Lennette Bihari, MD  Coenzyme Q10 (COQ10) 200 MG CAPS Take by mouth daily.    [provider]  hydrochlorothiazide (HYDRODIURIL) 25 MG tablet TAKE 1 TABLET BY MOUTH EVERY DAY 03/25/20   Lennette Bihari, MD  metoprolol succinate (TOPROL-XL) 50 MG 24 hr tablet TAKE 1 TABLET BY MOUTH DAILY WITH OR IMMEDIATELY FOLLOWING A MEAL 03/25/20   Lennette Bihari, MD     Critical care time: .    Simonne Martinet ACNP-BC [facility] Pulmonary/Critical Care Pager # [phone] OR # [phone] if no answer

## 2021-02-19 NOTE — Code Documentation (Signed)
Pt extremely diaphoretic, responding not as much. Pt still alert but unable to follow commands. MD Dykstra informed. Respiratory and pharmacy called to bedside.

## 2021-02-19 NOTE — Progress Notes (Signed)
Patient intubated by ED MD without complications.  Positive color change noted.  Bilateral breath sounds auscultated.  Chest xray pending for tube placement.  Tolerating well at this time.  Will continue to monitor.

## 2021-02-19 NOTE — Progress Notes (Signed)
eLink Physician-Brief Progress Note Patient Name: Danny Norris DOB: 02-06-1961 MRN: [id number]   Date of Service  02/19/2021  HPI/Events of Note  Multiple issues: 1. Urinary retention - Bladder scan with 743 mL residual and 2. Temp = 101.1 F - Patient already cultured and on Rocephin and Azithromycin. Nursing request for Tylenol. AST and ALT both elevated, therefore, will avoid Tylenol.  eICU Interventions  Plan: 1. I/O cath PRN . 2. Use Lidocaine jelly 2% to I/O cath patient.  3. Ice Packs PRN.      Intervention Category Major Interventions: Other:  Lenell Antu 02/19/2021, 7:44 PM

## 2021-02-19 NOTE — TOC Initial Note (Signed)
Transition of Care [facility]) - Initial/Assessment Note    Patient Details  Name: Danny Norris MRN: [id number] Date of Birth: 05-20-1961  Transition [facility] Care [facility]) CM/SW Contact:    Lockie Pares, RN Phone Number: 02/19/2021, 4:16 PM  Clinical Narrative:                 60 YO male employed at [employer] in with respiratory Failure Has PCP and insurance. COVID negative CAP leading to sepsis. AKI. fluid given, lactic acid elevation but BNP and troponin also elevated. Has sister and brother. In ICU intubated at present. IS a smoker, could be acute on chronic. Is not vaccinated. For COVID. CM will follow for needs once extubated may need OP sleep study for CPAP or trilogy .   Expected Discharge Plan: Home w Home Health Services Barriers to Discharge: Continued Medical Work up   Patient Goals and CMS Choice        Expected Discharge Plan and Services Expected Discharge Plan: Home w Home Health Services   Discharge Planning Services: CM Consult   Living arrangements for the past 2 months: Single Family Home                                      Prior Living Arrangements/Services Living arrangements for the past 2 months: Single Family Home   Patient language and need for interpreter reviewed:: Yes        Need for Family Participation in Patient Care: Yes (Comment) Care giver support system in place?: Yes (comment)   Criminal Activity/Legal Involvement Pertinent to Current Situation/Hospitalization: No - Comment as needed  Activities of Daily Living      Permission Sought/Granted                  Emotional Assessment   Attitude/Demeanor/Rapport: Intubated (Following Commands or Not Following Commands) Affect (typically observed): Unable to Assess Orientation: : Fluctuating Orientation (Suspected and/or reported Sundowners) Alcohol / Substance Use: Not Applicable Psych Involvement: No (comment)  Admission diagnosis:  Acute respiratory failure (HCC)  [J96.00] Acute respiratory failure with hypoxia (HCC) [J96.01] Community acquired pneumonia, unspecified laterality [J18.9] Patient Active Problem List   Diagnosis Date Noted  . Acute respiratory failure (HCC) 02/19/2021  . OSA (obstructive sleep apnea) 05/02/2013  . MVP (mitral valve prolapse) 05/02/2013  . MR (mitral regurgitation) 05/02/2013  . Hyperlipemia 05/02/2013  . HTN (hypertension) 05/02/2013   PCP:  Knox Royalty, MD Pharmacy:   [facility] [address], [place] - [address] [place] [address] Phone: [phone] Fax: [phone]  [facility] [address] [place] - [address] [place] [address] Phone: [phone] Fax: [phone]     Social Determinants of Health (SDOH) Interventions    Readmission Risk Interventions No flowsheet data found.

## 2021-02-19 NOTE — ED Notes (Signed)
Attempted report x1. 

## 2021-02-19 NOTE — Progress Notes (Signed)
Post intubation ABG results obtained on ventilator settings of VT: 620, RR: 22, FIO2: 100%, and PEEP: 5.0.  Decreased FIO2 to 60%.  Tolerating well.  Will continue to monitor.    Ref. Range 02/19/2021 12:14  Sample type Unknown ARTERIAL  pH, Arterial Latest Ref Range: 7.350 - 7.450  7.383  pCO2 arterial Latest Ref Range: 32.0 - 48.0 mmHg 57.5 (H)  pO2, Arterial Latest Ref Range: 83.0 - 108.0 mmHg 157 (H)  TCO2 Latest Ref Range: 22 - 32 mmol/L 36 (H)  Acid-Base Excess Latest Ref Range: 0.0 - 2.0 mmol/L 7.0 (H)  Bicarbonate Latest Ref Range: 20.0 - 28.0 mmol/L 34.0 (H)  O2 Saturation Latest Units: % 99.0  Patient temperature Unknown 99.7 F  Collection site Unknown Radial

## 2021-02-19 NOTE — ED Provider Notes (Signed)
[facility] EMERGENCY DEPARTMENT Provider Note   CSN: [id number] Arrival date & time: 02/19/21  0746     History Chief Complaint  Patient presents with  . Shortness of Breath    Danny Norris is a 60 y.o. male.  Presents to ER with concern for shortness of breath.  Patient states for the past 2 weeks he has been feeling generally unwell, fatigued, intermittent body aches and chills.  Unsure if he had a fever.  He is not vaccinated against COVID-19.  Has not been tested for COVID.  Over the past week he has had steadily increasing shortness of breath.  States that he has a hard time catching his breath.  Does not have associated chest pain.  He denies any abdominal pain or back pain.  Has history of mitral valve regurgitation, denies other medical problems.  Per chart review also has history of hypertension hyperlipidemia.  HPI     Past Medical History:  Diagnosis Date  . Dyslipidemia   . HTN (hypertension)   . Mitral valve regurgitation    a. 05/2011: TEE showing significant MVP especially involving the P2 segment without evidence for ruptured chordae and had eccentric MR anteriorly directed. b. 03/2015: echo showing EF 65-70% with posterior MV leaflet thickening and prolapse with mild MR.  . Normal coronary arteries    a. normal cors by cath in 08/2011    Patient Active Problem List   Diagnosis Date Noted  . OSA (obstructive sleep apnea) 05/02/2013  . MVP (mitral valve prolapse) 05/02/2013  . MR (mitral regurgitation) 05/02/2013  . Hyperlipemia 05/02/2013  . HTN (hypertension) 05/02/2013    Past Surgical History:  Procedure Laterality Date  . EYE SURGERY  1975  . HERNIA REPAIR Right 2009   Rt inguinal repair  . LEFT AND RIGHT HEART CATHETERIZATION WITH CORONARY ANGIOGRAM N/A 09/01/2011   Procedure: LEFT AND RIGHT HEART CATHETERIZATION WITH CORONARY ANGIOGRAM;  Surgeon: Troy Sine, MD;  Location: [facility] CATH LAB;  Service: Cardiovascular;  Laterality:  N/A;       Family History  Problem Relation Age of Onset  . Sleep apnea Brother   . Congestive Heart Failure Mother     Social History   Tobacco Use  . Smoking status: Never Smoker  . Smokeless tobacco: Never Used  Substance Use Topics  . Alcohol use: No    Alcohol/week: 0.0 standard drinks    Home Medications Prior to Admission medications   Medication Sig Start Date End Date Taking? Authorizing Provider  amLODipine-benazepril (LOTREL) 10-40 MG capsule TAKE 1 CAPSULE BY MOUTH EVERY DAY 03/25/20   Troy Sine, MD  aspirin 81 MG chewable tablet Chew 81 mg by mouth daily.    [provider]  atorvastatin (LIPITOR) 20 MG tablet Take 1 tablet (20 mg total) by mouth daily at 6 PM. 09/17/19   Troy Sine, MD  Coenzyme Q10 (COQ10) 200 MG CAPS Take by mouth daily.    [provider]  hydrochlorothiazide (HYDRODIURIL) 25 MG tablet TAKE 1 TABLET BY MOUTH EVERY DAY 03/25/20   Troy Sine, MD  metoprolol succinate (TOPROL-XL) 50 MG 24 hr tablet TAKE 1 TABLET BY MOUTH DAILY WITH OR IMMEDIATELY FOLLOWING A MEAL 03/25/20   Troy Sine, MD    Allergies    Contrast media [iodinated diagnostic agents]  Review of Systems   Review of Systems  Constitutional: Positive for chills and fatigue. Negative for fever.  HENT: Negative for ear pain and sore  throat.   Eyes: Negative for pain and visual disturbance.  Respiratory: Positive for cough and shortness of breath.   Cardiovascular: Negative for chest pain and palpitations.  Gastrointestinal: Negative for abdominal pain and vomiting.  Genitourinary: Negative for dysuria and hematuria.  Musculoskeletal: Positive for myalgias. Negative for arthralgias and back pain.  Skin: Negative for color change and rash.  Neurological: Negative for seizures and syncope.  All other systems reviewed and are negative.   Physical Exam Updated Vital Signs BP 110/83   Pulse 66   Temp 99.7 F (37.6 C) (Oral)   Resp 19   Ht  6' (1.829 m)   Wt 116.2 kg   SpO2 97%   BMI 34.74 kg/m   Physical Exam Vitals and nursing note reviewed.  Constitutional:      Comments: Alert, appears to be in mild respiratory difficulty but not frank distress  HENT:     Head: Normocephalic and atraumatic.  Eyes:     Conjunctiva/sclera: Conjunctivae normal.  Cardiovascular:     Rate and Rhythm: Normal rate and regular rhythm.     Heart sounds: No murmur heard.   Pulmonary:     Effort: Pulmonary effort is normal. No respiratory distress.     Breath sounds: Normal breath sounds.     Comments: Somewhat tachypneic, crackles noted bilaterally, speaking in shortened phrases Abdominal:     Palpations: Abdomen is soft.     Tenderness: There is no abdominal tenderness.  Musculoskeletal:     Cervical back: Neck supple.  Skin:    General: Skin is warm and dry.  Neurological:     General: No focal deficit present.  Psychiatric:        Mood and Affect: Mood normal.     ED Results / Procedures / Treatments   Labs (all labs ordered are listed, but only abnormal results are displayed) Labs Reviewed  CBC WITH DIFFERENTIAL/PLATELET - Abnormal; Notable for the following components:      Result Value   Hemoglobin 17.6 (*)    HCT 58.7 (*)    MCV 102.1 (*)    All other components within normal limits  COMPREHENSIVE METABOLIC PANEL - Abnormal; Notable for the following components:   CO2 36 (*)    Glucose, Bld 150 (*)    Calcium 10.6 (*)    AST 79 (*)    ALT 71 (*)    Anion gap 4 (*)    All other components within normal limits  BRAIN NATRIURETIC PEPTIDE - Abnormal; Notable for the following components:   B Natriuretic Peptide 206.9 (*)    All other components within normal limits  TROPONIN I (HIGH SENSITIVITY) - Abnormal; Notable for the following components:   Troponin I (High Sensitivity) 36 (*)    All other components within normal limits  RESP PANEL BY RT-PCR (FLU A&B, COVID) ARPGX2  CULTURE, BLOOD (ROUTINE X 2)   CULTURE, BLOOD (ROUTINE X 2)  BLOOD GAS, ARTERIAL  LACTIC ACID, PLASMA  LACTIC ACID, PLASMA  TROPONIN I (HIGH SENSITIVITY)    EKG EKG Interpretation  Date/Time:  Friday Feb 19 2021 07:53:52 EDT Ventricular Rate:  90 PR Interval:  151 QRS Duration: 98 QT Interval:  375 QTC Calculation: 459 R Axis:   -29 Text Interpretation: Sinus rhythm Ventricular premature complex Borderline left axis deviation Confirmed by Madalyn Rob [phone]) on 02/19/2021 10:35:01 AM   Radiology DG Chest Portable 1 View  Result Date: 02/19/2021 CLINICAL DATA:  Post intubation EXAM: PORTABLE CHEST 1 VIEW COMPARISON:  02/19/2021 FINDINGS: Endotracheal tube 3.4 cm above the carina. Elevated right hemidiaphragm with right base atelectasis. Mild cardiomegaly. Vascular congestion. Improving aeration on the left with decreasing perihilar and lower lobe opacities. IMPRESSION: Interval intubation. Improving left perihilar and lower lobe airspace opacity. Elevated right hemidiaphragm with right base atelectasis. Cardiomegaly, vascular congestion. Electronically Signed   By: Rolm Baptise M.D.   On: 02/19/2021 11:12   DG Chest Portable 1 View  Result Date: 02/19/2021 CLINICAL DATA:  Shortness of breath.  Reported COVID-19 positive EXAM: PORTABLE CHEST 1 VIEW COMPARISON:  April 04, 2021 FINDINGS: There is elevation of the right hemidiaphragm. There is ill-defined airspace opacity in each perihilar region. There is mild left base atelectasis. No consolidation. Heart size within normal limits. There is apparent hilar adenopathy bilaterally. There is also soft tissue fullness in the left paratracheal region. There is apparent rightward deviation of the lower cervical and upper thoracic trachea. IMPRESSION: 1.  Elevation right hemidiaphragm of uncertain etiology. 2. Ill-defined airspace opacity in each right perihilar region. Question atypical organism pneumonia. Mild left base atelectasis. 3. Apparent bilateral hilar adenopathy.  Questionable left paratracheal adenopathy as well. This distribution of adenopathy may be seen with sarcoidosis. Neoplasm could present similarly. Chest CT advised to further evaluate for extent and distribution of apparent adenopathy. 4. Question thyroid enlargement given relative deviation toward the right of the lower cervical and upper thoracic trachea. CT again would be most helpful for potential thyroid mass/enlargement. Electronically Signed   By: Lowella Grip III M.D.   On: 02/19/2021 09:05    Procedures .Critical Care Performed by: Lucrezia Starch, MD Authorized by: Lucrezia Starch, MD   Critical care provider statement:    Critical care time (minutes):  52   Critical care was necessary to treat or prevent imminent or life-threatening deterioration of the following conditions:  Respiratory failure   Critical care was time spent personally by me on the following activities:  Discussions with consultants, evaluation of patient's response to treatment, examination of patient, ordering and performing treatments and interventions, ordering and review of laboratory studies, ordering and review of radiographic studies, pulse oximetry, re-evaluation of patient's condition, obtaining history from patient or surrogate and review of old charts Procedure Name: Intubation Date/Time: 02/19/2021 11:37 AM Performed by: Lucrezia Starch, MD Pre-anesthesia Checklist: Patient identified Oxygen Delivery Method: Ambu bag Preoxygenation: Pre-oxygenation with 100% oxygen Induction Type: Rapid sequence Ventilation: Mask ventilation with difficulty and Oral airway inserted - appropriate to patient size Laryngoscope Size: Mac, 4 and Glidescope Grade View: Grade I Tube size: 7.5 mm Number of attempts: 1 Airway Equipment and Method: Video-laryngoscopy,  Oral airway and Rigid stylet Placement Confirmation: ETT inserted through vocal cords under direct vision and Positive ETCO2 Secured at: 25  cm Tube secured with: ETT holder Comments: Some difficulty with BVM, utilized oral airway to improve bagging, intubation itself was atraumatic and easy, visualized endotracheal tube passing through cords on 1 attempt.  Intubation performed by EM PGY3 resident Dr. Miki Kins under my direct supervision.        Medications Ordered in ED Medications  cefTRIAXone (ROCEPHIN) 1 g in sodium chloride 0.9 % 100 mL IVPB (1 g Intravenous New Bag/Given 02/19/21 1120)  azithromycin (ZITHROMAX) 500 mg in sodium chloride 0.9 % 250 mL IVPB (has no administration in time range)  docusate (COLACE) 50 MG/5ML liquid 100 mg (has no administration in time range)  polyethylene glycol (MIRALAX / GLYCOLAX) packet 17 g (has no administration in time range)  fentaNYL 2572mg  in NS 262m (181m/ml) infusion-PREMIX (has no administration in time range)  fentaNYL (SUBLIMAZE) bolus via infusion 50-100 mcg (50 mcg Intravenous Bolus from Bag 02/19/21 1137)  propofol (DIPRIVAN) 1000 MG/100ML infusion (has no administration in time range)  norepinephrine (LEVOPHED) 60m6mn 250m45memix infusion (2 mcg/min Intravenous Rate/Dose Change 02/19/21 1136)  etomidate (AMIDATE) injection (30 mg Intravenous Given 02/19/21 1026)  rocuronium (ZEMURON) injection (100 mg Intravenous Given 02/19/21 1027)  fentaNYL (SUBLIMAZE) injection 50 mcg (50 mcg Intravenous Given 02/19/21 1117)  sodium chloride 0.9 % bolus 1,000 mL (0 mLs Intravenous Stopped 02/19/21 1128)  midazolam (VERSED) injection 2 mg (2 mg Intravenous Given 02/19/21 1116)    ED Course  I have reviewed the triage vital signs and the nursing notes.  Pertinent labs & imaging results that were available during my care of the patient were reviewed by me and considered in my medical decision making (see chart for details).    MDM Rules/Calculators/A&P                         59 y54r old gentleman presenting to ER with concern for shortness of breath, myalgias, chills.  Requiring  significant amount of supplemental oxygen.  Chest x-ray with concern for pneumonia.  Started on antibiotics.  Send COVID/flu testing.  While awaiting work-up, patient became precipitously more dyspneic and lethargic.  When I reassessed patient, he was significantly more lethargic, poor respiratory effort, increasing hypoxia.  Proceeded with intubation for acute respiratory failure.  Successful RSI.  Immediately postintubation BP was okay and oxygenation rapidly improved.  However patient later became hypotensive.  Provided fluid plus and started on low-dose peripheral Levophed with improvement of blood pressure.  Critical care came to bedside and assumed care.  Remainder of work-up noted for mild troponin elevation but no clear ischemic changes on EKG, lower suspicion for ACS at present. covid negative.  Suspect most likely pneumonia, possibly mucous plugging given the improved aeration postintubation.  Based on the patient's history had higher suspicion for infectious process as primary driver and lower suspicion for acute PE but this has not been fully ruled out at present, discussed possible differential with critical care who is now at bedside. Given contrast allergy and shortage, will defer further diagnostic testing to admitting critical care team.  Updated sister listed as emergency contact.   Final Clinical Impression(s) / ED Diagnoses Final diagnoses:  Acute respiratory failure with hypoxia (HCC)[place] acquired pneumonia, unspecified laterality    Rx / DC Orders ED Discharge Orders    None       DyksLucrezia Starch 02/19/21 1143

## 2021-02-19 NOTE — Progress Notes (Signed)
Transported pt on vent from ED to 3M10 on full support, 100% FIO2.  Tolerated well.

## 2021-02-19 NOTE — Progress Notes (Signed)
Pharmacy Antibiotic Note  Danny Norris is a 60 y.o. male admitted on 02/19/2021 with acute hypoxic respiratory failure with viral prodrome (COVID negative), pulmonary opacities, along with septic shock.  Pharmacy has been consulted for vancomycin dosing.  WBC 9.8, temp 100.2 F; Scr 1.16 (up from 0.8's-0.9s > 1 yr ago), CrCl 90 ml/min  Plan: Vancomycin 2250 mg IV X 1, followed by vancomycin 1 gm IV Q 12 hrs (estimated vancomycin AUC, using Scr 1.16 and Vd coefficient of 0.5 for BMI ~35, is 517; goal vancomycin AUC is 400-550) Monitor WBC, temp, clinical improvement, cultures, renal function, vancomycin levels as indicated  Height: 6' 0.01" (182.9 cm) Weight: 115.7 kg (255 lb 1.2 oz) IBW/kg (Calculated) : 77.62  Temp (24hrs), Avg:99.3 F (37.4 C), Min:98.1 F (36.7 C), Max:101.1 F (38.4 C)  Recent Labs  Lab 02/19/21 1031 02/19/21 1134 02/19/21 1234  WBC 9.8  --   --   CREATININE 1.16  --   --   LATICACIDVEN  --  1.2 2.2*    Estimated Creatinine Clearance: 90 mL/min (by C-G formula based on SCr of 1.16 mg/dL).    Allergies  Allergen Reactions  . Contrast Media [Iodinated Diagnostic Agents] Rash    Antimicrobials this admission: Ceftriaxone 5/20 >> Azithromycin 5/20 >> Vancomycin 5/20 >>  Microbiology results: 5/20 BCx X 2: pending 5/20 MRSA PCR: negative 5/20 COVID, flu A, flu B, HIV screen: negative  Thank you for allowing pharmacy to be a part of this patient's care.  Vicki Mallet, PharmD, BCPS, [facility] Clinical Pharmacist 02/19/2021 9:43 PM

## 2021-02-19 NOTE — Progress Notes (Signed)
eLink Physician-Brief Progress Note Patient Name: Danny Norris DOB: 05/31/1961 MRN: [id number]   Date of Service  02/19/2021  HPI/Events of Note  Patient is supposed to be on Vancomycin, Rocephin and Azithromycin per Dr. Shirlee More note. However, the patient on received on dose of Azithromycin and Rocephin and an order for Vancomycin was never written.   eICU Interventions  Plan: 1. Vancomycin per pharmacy consult. 2. Continue Rocephin and Azithromycin.      Intervention Category Major Interventions: Infection - evaluation and management  Lenell Antu 02/19/2021, 9:37 PM

## 2021-02-20 ENCOUNTER — Inpatient Hospital Stay (HOSPITAL_COMMUNITY): Payer: BC Managed Care – PPO

## 2021-02-20 DIAGNOSIS — R0603 Acute respiratory distress: Secondary | ICD-10-CM

## 2021-02-20 DIAGNOSIS — J189 Pneumonia, unspecified organism: Secondary | ICD-10-CM

## 2021-02-20 DIAGNOSIS — J9601 Acute respiratory failure with hypoxia: Secondary | ICD-10-CM | POA: Diagnosis not present

## 2021-02-20 DIAGNOSIS — R0602 Shortness of breath: Secondary | ICD-10-CM | POA: Diagnosis not present

## 2021-02-20 LAB — ECHOCARDIOGRAM COMPLETE
Area-P 1/2: 3.27 cm2
Calc EF: 55 %
Height: 72.008 in
P 1/2 time: 568 msec
S' Lateral: 3.8 cm
Single Plane A2C EF: 60 %
Single Plane A4C EF: 46.6 %
Weight: 4158.76 oz

## 2021-02-20 LAB — CBC
HCT: 45.8 % (ref 39.0–52.0)
Hemoglobin: 15 g/dL (ref 13.0–17.0)
MCH: 31 pg (ref 26.0–34.0)
MCHC: 32.8 g/dL (ref 30.0–36.0)
MCV: 94.6 fL (ref 80.0–100.0)
Platelets: 172 10*3/uL (ref 150–400)
RBC: 4.84 MIL/uL (ref 4.22–5.81)
RDW: 15.4 % (ref 11.5–15.5)
WBC: 9.5 10*3/uL (ref 4.0–10.5)
nRBC: 0 % (ref 0.0–0.2)

## 2021-02-20 LAB — BLOOD GAS, ARTERIAL
Acid-Base Excess: 6.3 mmol/L — ABNORMAL HIGH (ref 0.0–2.0)
Bicarbonate: 28.8 mmol/L — ABNORMAL HIGH (ref 20.0–28.0)
Drawn by: 60057
FIO2: 60
O2 Saturation: 99.2 %
Patient temperature: 37.4
pCO2 arterial: 31 mmHg — ABNORMAL LOW (ref 32.0–48.0)
pH, Arterial: 7.576 — ABNORMAL HIGH (ref 7.350–7.450)
pO2, Arterial: 116 mmHg — ABNORMAL HIGH (ref 83.0–108.0)

## 2021-02-20 LAB — PHOSPHORUS
Phosphorus: <1 mg/dL — CL (ref 2.5–4.6)
Phosphorus: 3 mg/dL (ref 2.5–4.6)

## 2021-02-20 LAB — GLUCOSE, CAPILLARY
Glucose-Capillary: 118 mg/dL — ABNORMAL HIGH (ref 70–99)
Glucose-Capillary: 103 mg/dL — ABNORMAL HIGH (ref 70–99)
Glucose-Capillary: 106 mg/dL — ABNORMAL HIGH (ref 70–99)
Glucose-Capillary: 99 mg/dL (ref 70–99)
Glucose-Capillary: 110 mg/dL — ABNORMAL HIGH (ref 70–99)
Glucose-Capillary: 112 mg/dL — ABNORMAL HIGH (ref 70–99)

## 2021-02-20 LAB — BASIC METABOLIC PANEL
Anion gap: 7 (ref 5–15)
BUN: 19 mg/dL (ref 6–20)
CO2: 28 mmol/L (ref 22–32)
Calcium: 9.7 mg/dL (ref 8.9–10.3)
Chloride: 109 mmol/L (ref 98–111)
Creatinine, Ser: 1.27 mg/dL — ABNORMAL HIGH (ref 0.61–1.24)
GFR, Estimated: >60 mL/min (ref >60)
Glucose, Bld: 98 mg/dL (ref 70–99)
Potassium: 3.2 mmol/L — ABNORMAL LOW (ref 3.5–5.1)
Sodium: 144 mmol/L (ref 135–145)

## 2021-02-20 LAB — MAGNESIUM
Magnesium: 1.7 mg/dL (ref 1.7–2.4)
Magnesium: 1.9 mg/dL (ref 1.7–2.4)

## 2021-02-20 LAB — LEGIONELLA PNEUMOPHILA SEROGP 1 UR AG: L. pneumophila Serogp 1 Ur Ag: NEGATIVE

## 2021-02-20 LAB — HEPARIN LEVEL (UNFRACTIONATED): Heparin Unfractionated: 0.21 IU/mL — ABNORMAL LOW (ref 0.30–0.70)

## 2021-02-20 LAB — TRIGLYCERIDES: Triglycerides: 93 mg/dL (ref <150)

## 2021-02-20 MED ORDER — HEPARIN BOLUS VIA INFUSION
1500.0000 [IU] | Freq: Once | INTRAVENOUS | Status: AC
  Administered 2021-02-20: 1500 [IU] via INTRAVENOUS
  Filled 2021-02-20: qty 1500

## 2021-02-20 MED ORDER — SODIUM CHLORIDE 3 % IN NEBU
4.0000 mL | INHALATION_SOLUTION | Freq: Two times a day (BID) | RESPIRATORY_TRACT | Status: AC
  Administered 2021-02-21 – 2021-02-23 (×5): 4 mL via RESPIRATORY_TRACT
  Filled 2021-02-20 (×6): qty 4

## 2021-02-20 MED ORDER — MAGNESIUM SULFATE 2 GM/50ML IV SOLN
2.0000 g | Freq: Once | INTRAVENOUS | Status: AC
  Administered 2021-02-20: 2 g via INTRAVENOUS
  Filled 2021-02-20: qty 50

## 2021-02-20 MED ORDER — HEPARIN (PORCINE) 25000 UT/250ML-% IV SOLN
1950.0000 [IU]/h | INTRAVENOUS | Status: DC
  Administered 2021-02-20: 1900 [IU]/h via INTRAVENOUS
  Administered 2021-02-20: 1700 [IU]/h via INTRAVENOUS
  Administered 2021-02-22: 1900 [IU]/h via INTRAVENOUS
  Filled 2021-02-20 (×4): qty 250

## 2021-02-20 MED ORDER — "THROMBI-PAD 3""X3"" EX PADS"
1.0000 | MEDICATED_PAD | Freq: Once | CUTANEOUS | Status: AC
  Administered 2021-02-20: 1 via TOPICAL
  Filled 2021-02-20: qty 1

## 2021-02-20 MED ORDER — VITAL HIGH PROTEIN PO LIQD
1000.0000 mL | ORAL | Status: DC
  Administered 2021-02-20 – 2021-02-21 (×2): 1000 mL

## 2021-02-20 MED ORDER — SODIUM CHLORIDE 3 % IN NEBU
INHALATION_SOLUTION | RESPIRATORY_TRACT | Status: AC
  Filled 2021-02-20: qty 4

## 2021-02-20 MED ORDER — POTASSIUM & SODIUM PHOSPHATES 280-160-250 MG PO PACK
2.0000 | PACK | Freq: Two times a day (BID) | ORAL | Status: AC
  Administered 2021-02-20 (×2): 2
  Filled 2021-02-20 (×2): qty 2

## 2021-02-20 MED ORDER — HEPARIN BOLUS VIA INFUSION
4000.0000 [IU] | Freq: Once | INTRAVENOUS | Status: AC
  Administered 2021-02-20: 4000 [IU] via INTRAVENOUS
  Filled 2021-02-20: qty 4000

## 2021-02-20 MED ORDER — POTASSIUM CHLORIDE 20 MEQ PO PACK
40.0000 meq | PACK | Freq: Once | ORAL | Status: AC
  Administered 2021-02-20: 40 meq
  Filled 2021-02-20: qty 2

## 2021-02-20 MED ORDER — SODIUM PHOSPHATES 45 MMOLE/15ML IV SOLN
30.0000 mmol | Freq: Once | INTRAVENOUS | Status: AC
  Administered 2021-02-20: 30 mmol via INTRAVENOUS
  Filled 2021-02-20: qty 10

## 2021-02-20 MED ORDER — PROSOURCE TF PO LIQD
45.0000 mL | Freq: Two times a day (BID) | ORAL | Status: DC
  Administered 2021-02-20 – 2021-02-22 (×4): 45 mL
  Filled 2021-02-20 (×4): qty 45

## 2021-02-20 NOTE — Progress Notes (Signed)
  Echocardiogram 2D Echocardiogram with 3D and strain has been performed.  Leta Jungling M 02/20/2021, 9:48 AM

## 2021-02-20 NOTE — Progress Notes (Signed)
ANTICOAGULATION CONSULT NOTE - Initial Consult  Pharmacy Consult for heparin Indication: DVT  Allergies  Allergen Reactions  . Contrast Media [Iodinated Diagnostic Agents] Rash    Patient Measurements: Height: 6' 0.01" (182.9 cm) Weight: 117.9 kg (259 lb 14.8 oz) IBW/kg (Calculated) : 77.62 Heparin Dosing Weight: 102 kg  Vital Signs: Temp: 99.2 F (37.3 C) (05/21 0801) Temp Source: Axillary (05/21 0801) BP: 84/52 (05/21 1000) Pulse Rate: 65 (05/21 1000)  Labs: Recent Labs    02/19/21 1031 02/19/21 1214 02/19/21 1225 02/20/21 0645  HGB 17.6* 16.0  --  15.0  HCT 58.7* 47.0  --  45.8  PLT 234  --   --  172  CREATININE 1.16  --   --  1.27*  TROPONINIHS 36*  --  41*  --     Estimated Creatinine Clearance: 83 mL/min (A) (by C-G formula based on SCr of 1.27 mg/dL (H)).  Assessment: 60 yo m presenting with resp failure  covid neg Found with BL DVT  On sq heparin  CBC stable, no AC PTA  Goal of Therapy:  Heparin level 0.3-0.7 units/ml Monitor platelets by anticoagulation protocol: Yes   Plan:  Dc sq hep Hep 4000 x 1 Heparin 1700 units/hr 1830 HL  Elmer Sow, PharmD, BCCCP Clinical Pharmacist [phone]  Please check AMION for all [facility] Pharmacy numbers  02/20/2021 10:56 AM

## 2021-02-20 NOTE — Progress Notes (Signed)
ANTICOAGULATION CONSULT NOTE  Pharmacy Consult for heparin Indication: DVT  Allergies  Allergen Reactions  . Contrast Media [Iodinated Diagnostic Agents] Rash    Patient Measurements: Height: 6' 0.01" (182.9 cm) Weight: 117.9 kg (259 lb 14.8 oz) IBW/kg (Calculated) : 77.62 Heparin Dosing Weight: 102 kg  Vital Signs: Temp: 98.6 F (37 C) (05/21 1500) Temp Source: Axillary (05/21 1500) BP: 82/53 (05/21 1830) Pulse Rate: 64 (05/21 1830)  Labs: Recent Labs    02/19/21 1031 02/19/21 1214 02/19/21 1225 02/20/21 0645 02/20/21 1746  HGB 17.6* 16.0  --  15.0  --   HCT 58.7* 47.0  --  45.8  --   PLT 234  --   --  172  --   HEPARINUNFRC  --   --   --   --  0.21*  CREATININE 1.16  --   --  1.27*  --   TROPONINIHS 36*  --  41*  --   --     Estimated Creatinine Clearance: 83 mL/min (A) (by C-G formula based on SCr of 1.27 mg/dL (H)).  Assessment: 59 yo m presenting with resp failure and found with BL DVT -initial heparin level is 0.21 and below goal  Goal of Therapy:  Heparin level 0.3-0.7 units/ml Monitor platelets by anticoagulation protocol: Yes   Plan:  -heparin bolus 1500 units IV x1 and increase infusion to 1900 units/hr -Heparin level in 6 hours and daily wth CBC daily  Harland German, PharmD Clinical Pharmacist **Pharmacist phone directory can now be found on [email or web address] ([email or web address]).  Listed under [facility] Pharmacy.

## 2021-02-20 NOTE — Progress Notes (Signed)
VASCULAR LAB    Bilateral lower extremity venous duplex has been performed.  See CV proc for preliminary results.  Messaged results to Dr. Delton Coombes via secure chat.  Camelle Henkels, RVT 02/20/2021, 9:12 AM

## 2021-02-20 NOTE — Progress Notes (Signed)
NAME:  Danny Norris, MRN:  [id number], DOB:  12/15/1960, LOS: 1 ADMISSION DATE:  02/19/2021, CONSULTATION DATE:  5/20 REFERRING MD:  Stevie Kern, CHIEF COMPLAINT:  5/20   History of Present Illness:  This is a 60 year old male w/ hx as per below. Presented to the ED on 5/10 in acute respiratory distress, room air sats 60s. Oriented on arrival. Reported about 2 wk h/o generalized weakness, HAs, myalgia, w/ body aches and chills. He is NOT vaccinated against COVID. Over the week prior to presentation got progressively short of breath. While in ER went from 60% room air sats on arrival, initially stabilized on high flow oxygen then acutely became diaphoretic, stopped breathing and was urgently intubated. Pulm/critical care asked to admit   Pertinent  Medical History  MVP and MVR(w/ nml CAs), HTN, hyperlipidemia. 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 Hypoxic respiratory failure on arrival. Following what sounded like ~ 2 week viral prodrome. PCXR R>L airspace disease and RLL ATX. Intubated after failing high flow and progressing to resp arrest. Cultures sent. Azith, rocephin & vanc started. COVID & influenza PCR neg  . Lower extremity Doppler ultrasound 5/21 >> Acute DVT in the right peroneal, left peroneal, left posterior tibial veins . Echocardiogram 5/21 >>   Interim History / Subjective:   Required I&O cath overnight Norepinephrine currently at 3 Fentanyl 200 FiO2 0.60, PEEP 10  Objective   Blood pressure (!) 84/52, pulse 65, temperature 99.2 F (37.3 C), temperature source Axillary, resp. rate 14, height 6' 0.01" (1.829 m), weight 117.9 kg, SpO2 92 %.    Vent Mode: PRVC FiO2 (%):  [50 %-100 %] 50 % Set Rate:  [14 bmp-22 bmp] 14 bmp Vt Set:  [620 mL] 620 mL PEEP:  [8 cmH20-10 cmH20] 8 cmH20 Plateau Pressure:  [20 cmH20-23 cmH20] 20 cmH20   Intake/Output  Summary (Last 24 hours) at 02/20/2021 1156 Last data filed at 02/20/2021 0800 Gross per 24 hour  Intake 5456.51 ml  Output 1625 ml  Net 3831.51 ml   Filed Weights   02/19/21 1043 02/19/21 1518 02/20/21 0500  Weight: 116.2 kg 115.7 kg 117.9 kg    Examination: General: Obese man, sedated, ventilated HENT: ET tube in place, oropharynx otherwise clear, pupils equal Lungs: Coarse bilaterally, more so on the right, no wheezing Cardiovascular: Regular, 2/6 systolic murmur Abdomen: Nondistended, positive bowel sounds Extremities: No edema Neuro: Flickers eyes to voice and pain.  Did not open, did not track, did not follow commands  Labs/imaging that I havepersonally reviewed  (right click and "Reselect all SmartList Selections" daily)   Studies reviewed 5/21  Doppler ultrasound as above Respiratory alkalosis Hypokalemia, hypomagnesemia and severe hypophosphatemia Chest x-ray with persistent right basilar opacity, some elevation of the right hemidiaphragm  Resolved Hospital Problem list     Assessment & Plan:   Acute Hypoxic respiratory failure (POA) secondary to CAP (NOS to date).  PCXR w/ right > left airspace disease and elevated right HD. This is following what sounded like viral prodrome but at this point either bacterial or viral on ddx  Plan -Continue ventilatory support, PRVC 8 cc/kg -Wean PEEP and FiO2 as able -Decreased respiratory rate 14 given alkalosis -Following respiratory and blood cultures.  Will tailor antibiotics accordingly -Continue azithromycin, ceftriaxone and vancomycin for now -Follow chest x-ray  Circulatory shock. Developed post-intubation. Multifactorial:septic shock, hypovolemia and medication. Need  to also consider alternative dx such as PE Plan -Careful with sedating medications which will affect blood pressure -Home antihypertensive regimen on hold -Wean norepinephrine as able  Acute bilateral lower extremity DVT, at risk PE Plan -Heparin  infusion started 5/21  Hyperglycemia Plan -Sliding-scale insulin as ordered  H/o hypertension, MVP w/ MVR, mild Pulm hypertension and hyperlipidemia Plan -Home metoprolol, HCTZ, Lotrel are on hold -Continue Lipitor  Electrolyte abnormalities, severe hypophosphatemia, hypokalemia -Replace Phos, potassium aggressively and follow -replace Mg  Best practice (right click and "Reselect all SmartList Selections" daily)  Diet:  NPO Pain/Anxiety/Delirium protocol (if indicated): Yes (RASS goal -1,-2) VAP protocol (if indicated): Yes DVT prophylaxis: LMWH GI prophylaxis: PPI Glucose control:  SSI Yes Central venous access:  N/A Arterial line:  N/A Foley:  N/A Mobility:  bed rest  PT consulted: N/A Last date of multidisciplinary goals of care discussion [pending] Code Status:  full code Disposition: ICU  Labs   CBC: Recent Labs  Lab 02/19/21 1031 02/19/21 1214 02/20/21 0645  WBC 9.8  --  9.5  NEUTROABS 7.7  --   --   HGB 17.6* 16.0 15.0  HCT 58.7* 47.0 45.8  MCV 102.1*  --  94.6  PLT 234  --  172    Basic Metabolic Panel: Recent Labs  Lab 02/19/21 1031 02/19/21 1149 02/19/21 1214 02/20/21 0645  NA 145  --  146* 144  K 3.8  --  3.6 3.2*  CL 105  --   --  109  CO2 36*  --   --  28  GLUCOSE 150*  --   --  98  BUN 20  --   --  19  CREATININE 1.16  --   --  1.27*  CALCIUM 10.6*  --   --  9.7  MG  --  2.4  --  1.7  PHOS  --  4.3  --  <1.0*   GFR: Estimated Creatinine Clearance: 83 mL/min (A) (by C-G formula based on SCr of 1.27 mg/dL (H)). Recent Labs  Lab 02/19/21 1031 02/19/21 1134 02/19/21 1234 02/19/21 1523 02/20/21 0645  PROCALCITON  --   --   --  0.14  --   WBC 9.8  --   --   --  9.5  LATICACIDVEN  --  1.2 2.2*  --   --     Liver Function Tests: Recent Labs  Lab 02/19/21 1031  AST 79*  ALT 71*  ALKPHOS 64  BILITOT 0.7  PROT 6.8  ALBUMIN 4.2   No results for input(s): LIPASE, AMYLASE in the last 168 hours. No results for input(s): AMMONIA  in the last 168 hours.  ABG    Component Value Date/Time   PHART 7.576 (H) 02/20/2021 0442   PCO2ART 31.0 (L) 02/20/2021 0442   PO2ART 116 (H) 02/20/2021 0442   HCO3 28.8 (H) 02/20/2021 0442   TCO2 36 (H) 02/19/2021 1214   ACIDBASEDEF 1.0 09/01/2011 1008   O2SAT 99.2 02/20/2021 0442     Coagulation Profile: No results for input(s): INR, PROTIME in the last 168 hours.  Cardiac Enzymes: No results for input(s): CKTOTAL, CKMB, CKMBINDEX, TROPONINI in the last 168 hours.  HbA1C: Hgb A1c MFr Bld  Date/Time Value Ref Range Status  02/19/2021 03:23 PM 6.3 (H) 4.8 - 5.6 % Final    Comment:    (NOTE) Pre diabetes:          5.7%-6.4%  Diabetes:              >  6.4%  Glycemic control for   <7.0% adults with diabetes     CBG: Recent Labs  Lab 02/19/21 1521 02/19/21 1938 02/19/21 2329 02/20/21 0357 02/20/21 0803  GLUCAP 111* 93 107* 118* 103*     Critical care time: 33 min.     Levy Pupa, MD, PhD 02/20/2021, 12:07 PM [facility] [phone] or if no answer before 7:00PM call [phone] For any issues after 7:00PM please call eLink [phone]

## 2021-02-21 DIAGNOSIS — J189 Pneumonia, unspecified organism: Secondary | ICD-10-CM | POA: Diagnosis not present

## 2021-02-21 DIAGNOSIS — J9601 Acute respiratory failure with hypoxia: Secondary | ICD-10-CM | POA: Diagnosis not present

## 2021-02-21 LAB — CBC
HCT: 44.8 % (ref 39.0–52.0)
Hemoglobin: 14.2 g/dL (ref 13.0–17.0)
MCH: 30.6 pg (ref 26.0–34.0)
MCHC: 31.7 g/dL (ref 30.0–36.0)
MCV: 96.6 fL (ref 80.0–100.0)
Platelets: 152 10*3/uL (ref 150–400)
RBC: 4.64 MIL/uL (ref 4.22–5.81)
RDW: 15.9 % — ABNORMAL HIGH (ref 11.5–15.5)
WBC: 8.7 10*3/uL (ref 4.0–10.5)
nRBC: 0 % (ref 0.0–0.2)

## 2021-02-21 LAB — PHOSPHORUS
Phosphorus: 2.7 mg/dL (ref 2.5–4.6)
Phosphorus: 2.3 mg/dL — ABNORMAL LOW (ref 2.5–4.6)

## 2021-02-21 LAB — POCT I-STAT 7, (LYTES, BLD GAS, ICA,H+H)
Acid-Base Excess: 5 mmol/L — ABNORMAL HIGH (ref 0.0–2.0)
Bicarbonate: 29.5 mmol/L — ABNORMAL HIGH (ref 20.0–28.0)
Calcium, Ion: 1.35 mmol/L (ref 1.15–1.40)
HCT: 40 % (ref 39.0–52.0)
Hemoglobin: 13.6 g/dL (ref 13.0–17.0)
O2 Saturation: 92 %
Potassium: 3.1 mmol/L — ABNORMAL LOW (ref 3.5–5.1)
Sodium: 146 mmol/L — ABNORMAL HIGH (ref 135–145)
TCO2: 31 mmol/L (ref 22–32)
pCO2 arterial: 41.2 mmHg (ref 32.0–48.0)
pH, Arterial: 7.463 — ABNORMAL HIGH (ref 7.350–7.450)
pO2, Arterial: 60 mmHg — ABNORMAL LOW (ref 83.0–108.0)

## 2021-02-21 LAB — BASIC METABOLIC PANEL
Anion gap: 5 (ref 5–15)
BUN: 17 mg/dL (ref 6–20)
CO2: 31 mmol/L (ref 22–32)
Calcium: 9.3 mg/dL (ref 8.9–10.3)
Chloride: 110 mmol/L (ref 98–111)
Creatinine, Ser: 1.04 mg/dL (ref 0.61–1.24)
GFR, Estimated: >60 mL/min (ref >60)
Glucose, Bld: 102 mg/dL — ABNORMAL HIGH (ref 70–99)
Potassium: 3.3 mmol/L — ABNORMAL LOW (ref 3.5–5.1)
Sodium: 146 mmol/L — ABNORMAL HIGH (ref 135–145)

## 2021-02-21 LAB — GLUCOSE, CAPILLARY
Glucose-Capillary: 111 mg/dL — ABNORMAL HIGH (ref 70–99)
Glucose-Capillary: 108 mg/dL — ABNORMAL HIGH (ref 70–99)
Glucose-Capillary: 105 mg/dL — ABNORMAL HIGH (ref 70–99)
Glucose-Capillary: 81 mg/dL (ref 70–99)
Glucose-Capillary: 104 mg/dL — ABNORMAL HIGH (ref 70–99)

## 2021-02-21 LAB — HEPARIN LEVEL (UNFRACTIONATED): Heparin Unfractionated: 0.43 IU/mL (ref 0.30–0.70)

## 2021-02-21 LAB — MAGNESIUM
Magnesium: 1.9 mg/dL (ref 1.7–2.4)
Magnesium: 1.9 mg/dL (ref 1.7–2.4)

## 2021-02-21 MED ORDER — NYSTATIN 100000 UNIT/GM EX CREA
1.0000 "application " | TOPICAL_CREAM | Freq: Two times a day (BID) | CUTANEOUS | Status: AC
  Administered 2021-02-21 – 2021-02-24 (×6): 1 via TOPICAL
  Filled 2021-02-21: qty 15

## 2021-02-21 MED ORDER — POTASSIUM CHLORIDE 20 MEQ PO PACK
20.0000 meq | PACK | ORAL | Status: AC
  Administered 2021-02-21 (×2): 20 meq
  Filled 2021-02-21 (×2): qty 1

## 2021-02-21 MED ORDER — POTASSIUM CHLORIDE 10 MEQ/100ML IV SOLN
10.0000 meq | INTRAVENOUS | Status: AC
  Administered 2021-02-21 (×4): 10 meq via INTRAVENOUS
  Filled 2021-02-21 (×4): qty 100

## 2021-02-21 MED ORDER — HYDROCORTISONE 1 % EX CREA
1.0000 "application " | TOPICAL_CREAM | Freq: Two times a day (BID) | CUTANEOUS | Status: AC
  Administered 2021-02-21 – 2021-02-22 (×2): 1 via TOPICAL
  Filled 2021-02-21: qty 28

## 2021-02-21 MED ORDER — DIPHENHYDRAMINE HCL 50 MG/ML IJ SOLN
25.0000 mg | Freq: Once | INTRAMUSCULAR | Status: AC
  Administered 2021-02-21: 25 mg via INTRAVENOUS
  Filled 2021-02-21: qty 1

## 2021-02-21 NOTE — Progress Notes (Signed)
eLink Physician-Brief Progress Note Patient Name: Danny Norris DOB: [birth year] MRN: [id number]   Date of Service  02/21/2021  HPI/Events of Note  Camera for evaluation of skin itching over upper neck,chest.  Looks like either dry or thrush?. Pimple/itchy marks. Obese, skin fold between neck and chest.   eICU Interventions  Instead of giving another benadryl. Ordered hydrocortisone and micamine cream for now. On Vent.      Intervention Category Intermediate Interventions: Other:  Ranee Gosselin 02/21/2021, 8:01 PM

## 2021-02-21 NOTE — Progress Notes (Signed)
NAME:  Danny Norris, MRN:  [id number], DOB:  04/05/1961, LOS: 2 ADMISSION DATE:  02/19/2021, CONSULTATION DATE:  5/20 REFERRING MD:  Stevie Kern, CHIEF COMPLAINT:  5/20   History of Present Illness:  This is a 60 year old male w/ hx as per below. Presented to the ED on 5/10 in acute respiratory distress, room air sats 60s. Oriented on arrival. Reported about 2 wk h/o generalized weakness, HAs, myalgia, w/ body aches and chills. He is NOT vaccinated against COVID. Over the week prior to presentation got progressively short of breath. While in ER went from 60% room air sats on arrival, initially stabilized on high flow oxygen then acutely became diaphoretic, stopped breathing and was urgently intubated. Pulm/critical care asked to admit   Pertinent  Medical History  MVP and MVR(w/ nml CAs), HTN, hyperlipidemia. 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 Hypoxic respiratory failure on arrival. Following what sounded like ~ 2 week viral prodrome. PCXR R>L airspace disease and RLL ATX. Intubated after failing high flow and progressing to resp arrest. Cultures sent. Azith, rocephin & vanc started. COVID & influenza PCR neg  . Lower extremity Doppler ultrasound 5/21 >> Acute DVT in the right peroneal, left peroneal, left posterior tibial veins . Echocardiogram 5/21 >>   Interim History / Subjective:   Patient had some chest discomfort overnight, EKG performed showed sinus bradycardia with no other abnormalities I/O+ 6.7 L total Norepinephrine weaned to off Fentanyl 100 FiO2 0.40, PEEP 8  Objective   Blood pressure 101/69, pulse 63, temperature 99.1 F (37.3 C), temperature source Oral, resp. rate 14, height 6' 0.01" (1.829 m), weight 121.4 kg, SpO2 91 %.    Vent Mode: PRVC FiO2 (%):  [40 %] 40 % Set Rate:  [14 bmp] 14 bmp Vt Set:  [620 mL] 620 mL PEEP:  [5 cmH20-8  cmH20] 8 cmH20 Plateau Pressure:  [14 cmH20-18 cmH20] 14 cmH20   Intake/Output Summary (Last 24 hours) at 02/21/2021 1660 Last data filed at 02/21/2021 0700 Gross per 24 hour  Intake 3252.52 ml  Output 1215 ml  Net 2037.52 ml   Filed Weights   02/19/21 1518 02/20/21 0500 02/21/21 0500  Weight: 115.7 kg 117.9 kg 121.4 kg    Examination: General: Obese man, ventilated, comfortable HENT: ET tube in place, pupils equal, no oral lesions Lungs: Coarse bilaterally especially in the right, no wheezing Cardiovascular: Regular, soft systolic murmur Abdomen: Nondistended, positive bowel sounds Extremities: No edema Neuro: Wide-awake, nods to questions, follows commands, good strength  Labs/imaging that I havepersonally reviewed  (right click and "Reselect all SmartList Selections" daily)   All studies reviewed 5/22 Hypokalemia Relative hypophosphatemia  Resolved Hospital Problem list     Assessment & Plan:   Acute Hypoxic respiratory failure (POA) secondary to CAP (NOS to date).  PCXR w/ right > left airspace disease and elevated right HD. This is following what sounded like viral prodrome but at this point either bacterial or viral on ddx  Plan -Continue ventilatory support, PRVC 8 cc/kg -Progress has been made with ventilatory needs, plan to wean PEEP to 5, FiO2 now 0.40 -Push for pressure support soon. -Plan to continue broad-spectrum antibiotics until respiratory culture is speciated.  Azithromycin, ceftriaxone, vancomycin.  GPC and GVR on culture so far -Follow chest x-ray  Circulatory shock. Developed post-intubation. Multifactorial:septic shock, hypovolemia and medication. Need to also consider alternative dx  such as PE Plan -Hemodynamically improved, norepinephrine off.  Careful with sedation -Home antihypertensive regimen is on hold -Stop maintenance IV fluids  Acute bilateral lower extremity DVT, at risk PE Plan -Continue heparin  infusion  Hyperglycemia Plan -Sliding scale insulin as ordered  H/o hypertension, MVP w/ MVR, mild Pulm hypertension and hyperlipidemia Plan -Continue to hold home metoprolol, HCTZ, Lotrel -Continue Lipitor  Electrolyte abnormalities, severe hypophosphatemia, hypokalemia -Phosphorus replacement, magnesium replacement -Follow BMP  Best practice (right click and "Reselect all SmartList Selections" daily)  Diet:  NPO Pain/Anxiety/Delirium protocol (if indicated): Yes (RASS goal -1,-2) VAP protocol (if indicated): Yes DVT prophylaxis: LMWH GI prophylaxis: PPI Glucose control:  SSI Yes Central venous access:  N/A Arterial line:  N/A Foley:  N/A Mobility:  bed rest  PT consulted: N/A Last date of multidisciplinary goals of care discussion [pending] Code Status:  full code Disposition: ICU Family: Discussed status and plans with patient's sister at bedside 5/22  Labs   CBC: Recent Labs  Lab 02/19/21 1031 02/19/21 1214 02/20/21 0645 02/21/21 0343 02/21/21 0603  WBC 9.8  --  9.5  --  8.7  NEUTROABS 7.7  --   --   --   --   HGB 17.6* 16.0 15.0 13.6 14.2  HCT 58.7* 47.0 45.8 40.0 44.8  MCV 102.1*  --  94.6  --  96.6  PLT 234  --  172  --  152    Basic Metabolic Panel: Recent Labs  Lab 02/19/21 1031 02/19/21 1149 02/19/21 1214 02/20/21 0645 02/20/21 1746 02/21/21 0343 02/21/21 0603  NA 145  --  146* 144  --  146* 146*  K 3.8  --  3.6 3.2*  --  3.1* 3.3*  CL 105  --   --  109  --   --  110  CO2 36*  --   --  28  --   --  31  GLUCOSE 150*  --   --  98  --   --  102*  BUN 20  --   --  19  --   --  17  CREATININE 1.16  --   --  1.27*  --   --  1.04  CALCIUM 10.6*  --   --  9.7  --   --  9.3  MG  --  2.4  --  1.7 1.9  --  1.9  PHOS  --  4.3  --  <1.0* 3.0  --  2.7   GFR: Estimated Creatinine Clearance: 102.9 mL/min (by C-G formula based on SCr of 1.04 mg/dL). Recent Labs  Lab 02/19/21 1031 02/19/21 1134 02/19/21 1234 02/19/21 1523 02/20/21 0645  02/21/21 0603  PROCALCITON  --   --   --  0.14  --   --   WBC 9.8  --   --   --  9.5 8.7  LATICACIDVEN  --  1.2 2.2*  --   --   --     Liver Function Tests: Recent Labs  Lab 02/19/21 1031  AST 79*  ALT 71*  ALKPHOS 64  BILITOT 0.7  PROT 6.8  ALBUMIN 4.2   No results for input(s): LIPASE, AMYLASE in the last 168 hours. No results for input(s): AMMONIA in the last 168 hours.  ABG    Component Value Date/Time   PHART 7.463 (H) 02/21/2021 0343   PCO2ART 41.2 02/21/2021 0343   PO2ART 60 (L) 02/21/2021 0343   HCO3 29.5 (H) 02/21/2021 0343   TCO2 31  02/21/2021 0343   ACIDBASEDEF 1.0 09/01/2011 1008   O2SAT 92.0 02/21/2021 0343     Coagulation Profile: No results for input(s): INR, PROTIME in the last 168 hours.  Cardiac Enzymes: No results for input(s): CKTOTAL, CKMB, CKMBINDEX, TROPONINI in the last 168 hours.  HbA1C: Hgb A1c MFr Bld  Date/Time Value Ref Range Status  02/19/2021 03:23 PM 6.3 (H) 4.8 - 5.6 % Final    Comment:    (NOTE) Pre diabetes:          5.7%-6.4%  Diabetes:              >6.4%  Glycemic control for   <7.0% adults with diabetes     CBG: Recent Labs  Lab 02/20/21 1158 02/20/21 1537 02/20/21 1944 02/20/21 2319 02/21/21 0417  GLUCAP 106* 99 110* 112* 111*     Critical care time: 32 min.     Levy Pupa, MD, PhD 02/21/2021, 7:52 AM [facility] [phone] or if no answer before 7:00PM call [phone] For any issues after 7:00PM please call eLink [phone]

## 2021-02-21 NOTE — Progress Notes (Signed)
ANTICOAGULATION CONSULT NOTE   Pharmacy Consult for heparin Indication: DVT  Allergies  Allergen Reactions  . Contrast Media [Iodinated Diagnostic Agents] Rash    Patient Measurements: Height: 6' 0.01" (182.9 cm) Weight: 121.4 kg (267 lb 10.2 oz) IBW/kg (Calculated) : 77.62 Heparin Dosing Weight: 102 kg  Vital Signs: Temp: 99 F (37.2 C) (05/22 0700) Temp Source: Oral (05/22 0700) BP: 124/81 (05/22 0800) Pulse Rate: 71 (05/22 0800)  Labs: Recent Labs    02/19/21 1031 02/19/21 1214 02/19/21 1225 02/20/21 0645 02/20/21 1746 02/21/21 0343 02/21/21 0603  HGB 17.6*   < >  --  15.0  --  13.6 14.2  HCT 58.7*   < >  --  45.8  --  40.0 44.8  PLT 234  --   --  172  --   --  152  HEPARINUNFRC  --   --   --   --  0.21*  --  0.43  CREATININE 1.16  --   --  1.27*  --   --  1.04  TROPONINIHS 36*  --  41*  --   --   --   --    < > = values in this interval not displayed.    Estimated Creatinine Clearance: 102.9 mL/min (by C-G formula based on SCr of 1.04 mg/dL).  Assessment: 60 yo m presenting with resp failure  covid neg Found with BL DVT  Hep lvl within goal this am  CBC stable, no AC PTA  Goal of Therapy:  Heparin level 0.3-0.7 units/ml Monitor platelets by anticoagulation protocol: Yes   Plan:  Heparin 1900 units/hr Daily hep lvl cbc  Elmer Sow, PharmD, BCCCP Clinical Pharmacist [phone]  Please check AMION for all [facility] Pharmacy numbers  02/21/2021 9:12 AM

## 2021-02-21 NOTE — Progress Notes (Signed)
Pt c/o chest pain/pressure, 12-lead EKG done and in chart. E link notified. Will continue to monitor closely.

## 2021-02-21 NOTE — Progress Notes (Signed)
Pharmacy Electrolyte Replacement  Recent Labs:  Recent Labs    02/21/21 0603  K 3.3*  MG 1.9  PHOS 2.7  CREATININE 1.04    Low Critical Values (K </= 2.5, Phos </= 1, Mg </= 1) Present: None  Plan: Kcl 20 per tube x 2 kcl 10 iv x 4  Elmer Sow, PharmD, BCCCP Clinical Pharmacist [phone]  Please check AMION for all [facility] Pharmacy numbers  02/21/2021 9:12 AM

## 2021-02-22 DIAGNOSIS — J189 Pneumonia, unspecified organism: Secondary | ICD-10-CM | POA: Diagnosis not present

## 2021-02-22 DIAGNOSIS — J9601 Acute respiratory failure with hypoxia: Secondary | ICD-10-CM | POA: Diagnosis not present

## 2021-02-22 DIAGNOSIS — I82453 Acute embolism and thrombosis of peroneal vein, bilateral: Secondary | ICD-10-CM

## 2021-02-22 LAB — BASIC METABOLIC PANEL
Anion gap: 5 (ref 5–15)
BUN: 14 mg/dL (ref 6–20)
CO2: 31 mmol/L (ref 22–32)
Calcium: 9.4 mg/dL (ref 8.9–10.3)
Chloride: 108 mmol/L (ref 98–111)
Creatinine, Ser: 1 mg/dL (ref 0.61–1.24)
GFR, Estimated: >60 mL/min (ref >60)
Glucose, Bld: 95 mg/dL (ref 70–99)
Potassium: 3.9 mmol/L (ref 3.5–5.1)
Sodium: 144 mmol/L (ref 135–145)

## 2021-02-22 LAB — GLUCOSE, CAPILLARY
Glucose-Capillary: 100 mg/dL — ABNORMAL HIGH (ref 70–99)
Glucose-Capillary: 101 mg/dL — ABNORMAL HIGH (ref 70–99)
Glucose-Capillary: 106 mg/dL — ABNORMAL HIGH (ref 70–99)
Glucose-Capillary: 112 mg/dL — ABNORMAL HIGH (ref 70–99)
Glucose-Capillary: 121 mg/dL — ABNORMAL HIGH (ref 70–99)
Glucose-Capillary: 80 mg/dL (ref 70–99)
Glucose-Capillary: 97 mg/dL (ref 70–99)

## 2021-02-22 LAB — RESPIRATORY PANEL BY PCR

## 2021-02-22 LAB — CBC
HCT: 45.9 % (ref 39.0–52.0)
Hemoglobin: 14.3 g/dL (ref 13.0–17.0)
MCH: 30.8 pg (ref 26.0–34.0)
MCHC: 31.2 g/dL (ref 30.0–36.0)
MCV: 98.9 fL (ref 80.0–100.0)
Platelets: 166 10*3/uL (ref 150–400)
RBC: 4.64 MIL/uL (ref 4.22–5.81)
RDW: 16 % — ABNORMAL HIGH (ref 11.5–15.5)
WBC: 7.1 10*3/uL (ref 4.0–10.5)
nRBC: 0 % (ref 0.0–0.2)

## 2021-02-22 LAB — MAGNESIUM: Magnesium: 2 mg/dL (ref 1.7–2.4)

## 2021-02-22 LAB — CULTURE, RESPIRATORY W GRAM STAIN
Culture: NORMAL
Special Requests: NORMAL

## 2021-02-22 LAB — HEPARIN LEVEL (UNFRACTIONATED): Heparin Unfractionated: 0.32 IU/mL (ref 0.30–0.70)

## 2021-02-22 LAB — PHOSPHORUS: Phosphorus: 2.7 mg/dL (ref 2.5–4.6)

## 2021-02-22 MED ORDER — APIXABAN 5 MG PO TABS: 5.0000 mg | ORAL_TABLET | Freq: Two times a day (BID) | ORAL | Status: DC

## 2021-02-22 MED ORDER — APIXABAN 5 MG PO TABS
10.0000 mg | ORAL_TABLET | Freq: Two times a day (BID) | ORAL | Status: DC
  Administered 2021-02-22 – 2021-02-23 (×3): 10 mg
  Filled 2021-02-22 (×3): qty 2

## 2021-02-22 MED ORDER — IBUPROFEN 100 MG/5ML PO SUSP: 400.0000 mg | Freq: Once | ORAL | Status: DC

## 2021-02-22 MED ORDER — CHLORHEXIDINE GLUCONATE 0.12 % MT SOLN
OROMUCOSAL | Status: AC
  Filled 2021-02-22: qty 15

## 2021-02-22 MED ORDER — IBUPROFEN 100 MG/5ML PO SUSP
400.0000 mg | Freq: Once | ORAL | Status: AC
  Administered 2021-02-22: 400 mg
  Filled 2021-02-22: qty 20

## 2021-02-22 MED ORDER — POTASSIUM CHLORIDE 20 MEQ PO PACK
20.0000 meq | PACK | Freq: Once | ORAL | Status: AC
  Administered 2021-02-22: 20 meq via ORAL
  Filled 2021-02-22: qty 1

## 2021-02-22 MED ORDER — DEXMEDETOMIDINE HCL IN NACL 400 MCG/100ML IV SOLN
0.4000 ug/kg/h | INTRAVENOUS | Status: DC
  Administered 2021-02-22 (×2): 0.4 ug/kg/h via INTRAVENOUS
  Filled 2021-02-22: qty 100

## 2021-02-22 MED ORDER — FENTANYL CITRATE (PF) 100 MCG/2ML IJ SOLN: 50.0000 ug | INTRAMUSCULAR | Status: DC | PRN

## 2021-02-22 MED ORDER — FENTANYL CITRATE (PF) 100 MCG/2ML IJ SOLN
25.0000 ug | INTRAMUSCULAR | Status: DC | PRN
  Administered 2021-02-23: 25 ug via INTRAVENOUS
  Administered 2021-02-23: 50 ug via INTRAVENOUS
  Administered 2021-02-23 (×2): 25 ug via INTRAVENOUS
  Filled 2021-02-22 (×3): qty 2

## 2021-02-22 MED ORDER — PROSOURCE TF PO LIQD
90.0000 mL | Freq: Three times a day (TID) | ORAL | Status: DC
  Administered 2021-02-22 (×2): 90 mL
  Filled 2021-02-22 (×4): qty 90

## 2021-02-22 MED ORDER — VITAL HIGH PROTEIN PO LIQD
1000.0000 mL | ORAL | Status: DC
  Administered 2021-02-22: 1000 mL

## 2021-02-22 NOTE — Progress Notes (Signed)
NAME:  Danny Norris, MRN:  [id number], DOB:  1961/01/03, LOS: 3 ADMISSION DATE:  02/19/2021, CONSULTATION DATE:  5/20 REFERRING MD:  Stevie Kern, CHIEF COMPLAINT:  5/20   History of Present Illness:  This is a 60 year old male w/ hx as per below. Presented to the ED on 5/10 in acute respiratory distress, room air sats 60s. Oriented on arrival. Reported about 2 wk h/o generalized weakness, HAs, myalgia, w/ body aches and chills. He is NOT vaccinated against COVID. Over the week prior to presentation got progressively short of breath. While in ER went from 60% room air sats on arrival, initially stabilized on high flow oxygen then acutely became diaphoretic, stopped breathing and was urgently intubated. Pulm/critical care asked to admit  He remains in the [facility]M ICU in critical condition.   Pertinent  Medical History  MVP and MVR(w/ nml CAs), HTN, hyperlipidemia. 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 Hypoxic respiratory failure on arrival. Following what sounded like ~ 2 week viral prodrome. PCXR R>L airspace disease and RLL ATX. Intubated after failing high flow and progressing to resp arrest. Cultures sent. Azith, rocephin & vanc started. COVID & influenza PCR neg  . Lower extremity Doppler ultrasound 5/21 >> Acute DVT in the right peroneal, left peroneal, left posterior tibial veins . Echocardiogram 5/21 >>   Interim History / Subjective:  New rash overnight, treated with topicals  Tmax 99.4, BP 85/56-144/102, HR 53-90  1.4 L UOP, +359 24 hours, +7 L admission  Fentanyl at on exam  Unable to obtain subjective evaluation due to patient status  Objective   Blood pressure 98/65, pulse 63, temperature 99.6 F (37.6 C), temperature source Axillary, resp. rate 18, height 6' 0.01" (1.829 m), weight 122.1 kg, SpO2 91 %.    Vent Mode: PRVC FiO2 (%):  [40 %-50  %] 50 % Set Rate:  [14 bmp] 14 bmp Vt Set:  [620 mL] 620 mL PEEP:  [5 cmH20] 5 cmH20 Pressure Support:  [8 cmH20] 8 cmH20 Plateau Pressure:  [15 cmH20-17 cmH20] 15 cmH20   Intake/Output Summary (Last 24 hours) at 02/22/2021 1240 Last data filed at 02/22/2021 1100 Gross per 24 hour  Intake 2409.25 ml  Output 1625 ml  Net 784.25 ml   Filed Weights   02/20/21 0500 02/21/21 0500 02/22/21 0410  Weight: 117.9 kg 121.4 kg 122.1 kg    Examination: General: No acute distress, in bed, obese  HEENT: MM pink/moist, anicteric, trachea midline, ETT, OGT  Neuro: GCS 11T, RASS 0, PERRL24mm CV: S1S2, NSR, no m/r/g appreciated PULM:   Air movement in all lobes, slight expiratory wheezes, moderate white/tan secretions, chest expansion symmetric GI: soft, bsx4 active, nontender Extremities: warm/dry, trace pretibial edema, capillary refill less than 3 seconds  Skin: small erythema to chest,  No other rashes or lesions   Labs/imaging that I havepersonally reviewed  (right click and "Reselect all SmartList Selections" daily)   CBC, BMP  Resolved Hospital Problem list     Assessment & Plan:   Acute Hypoxic respiratory failure (POA) secondary to CAP (NOS to date).  PCXR w/ right > left airspace disease and elevated right HD. This is following what sounded like viral prodrome but at this point either bacterial or viral on ddx. WBC 8.7>7.1. Tmax 99.4  Plan -LTVV strategy with tidal volumes of 4-8 cc/kg ideal body weight -Goal plateau pressures  of 30 and driving pressures of 15 -Wean PEEP/FiO2 for SpO2 greater than  -Follow intermittent CXR and ABG PRN -Daily SAT/SBT -PAD bundle. Stopped IV fent infusion, started precedex gtt with intermittent fentanyl pushes. Patient continued to fall asleep on SBT today while on fentanyl. Goal RASS 0, titrate medications to goal. -Continue to follow cultures -Discontinued Vanc d/t negative MRSA PCR. Continue Rocephin and Azithromycin for goal 5-7 days of  coverage. Started 5/21. -NGTD fay 3 on BC. Continue to follow TA and BC.  Circulatory shock- Improved Developed post-intubation. Multifactorial:septic shock, hypovolemia and medication. Remains off pressors Plan -Sedation plan as discussed -Continue holding home antihypertensives  Acute bilateral lower extremity DVT, at risk PE Plan -Stop heparin infusion, start apixaban today -appreciate pharmacy assistance  Hyperglycemia BG 80 to 108 Plan -Continue SSI -Blood Glucose goal 140-180.  H/o hypertension, MVP w/ MVR, mild Pulm hypertension and hyperlipidemia Plan -Continue lipitor -Hold home metoprolol, HCTZ, Lotrel  Electrolyte abnormalities, severe hypophosphatemia, hypokalemia Phos 2.3>2.7, MG 2.60m BA 146>144 -Trend electrolytes on AM BMP -replete  Best practice (right click and "Reselect all SmartList Selections" daily)  Diet:  Tube Feed  Pain/Anxiety/Delirium protocol (if indicated): Yes (RASS goal 0) VAP protocol (if indicated): Yes DVT prophylaxis: LMWH and Systemic AC GI prophylaxis: PPI Glucose control:  SSI Yes Central venous access:  N/A Arterial line:  N/A Foley:  N/A Mobility:  bed rest  PT consulted: N/A Last date of multidisciplinary goals of care discussion [pending] Code Status:  full code Disposition: ICU Family: Discussed status and plans with patient's sister in law at bedside 5/23    Critical care time: 31 minutes   Gershon Mussel., MSN, APRN, AGACNP-BC [facility]  02/22/2021 , 12:41 PM  Please see [email or web address] for pager details  If no response, please call [phone] After hours, please call Elink at [phone]

## 2021-02-22 NOTE — Progress Notes (Signed)
Initial Nutrition Assessment  DOCUMENTATION CODES:   Obesity unspecified  INTERVENTION:   Tube feeding via OG tube: - Vital High Protein @ 50 ml/hr (1200 ml/day) - ProSource TF 90 ml TID  Tube feeding regimen provides 1440 kcal, 171 grams of protein, and 1003 ml of H2O.   NUTRITION DIAGNOSIS:   Inadequate oral intake related to inability to eat as evidenced by NPO status.  GOAL:   Provide needs based on ASPEN/SCCM guidelines  MONITOR:   Vent status,Labs,Weight trends,TF tolerance  REASON FOR ASSESSMENT:   Ventilator,Consult Enteral/tube feeding initiation and management  ASSESSMENT:   60 year old male who presented to the ED on 5/20 with SOB. PMH of HTN, HLD, MVR. Pt was intubated in the ED. Pt admitted with acute hypoxic respiratory failure with viral prodrome (COVID negative), CAP, and septic shock.   Discussed pt with RN and during ICU rounds. Pt tolerating current tube feeds well. RD to adjust to better meet pt's needs.  Consult received for tube feeding initiation and management. Pt with OG tube in stomach per abdominal x-ray on 5/20.  Met with pt and sister at bedside. Pt's sister provided history while pt gave thumbs up/down to confirm. Pt's sister reports that pt typically eats well. Pt has intentionally lost some weight recently per his and his sister's report. Reviewed weight history in chart. Admission weight is up from weights available from 2020.  Admit weight: 116.2 kg Current weight: 122.1 kg  Patient is currently intubated on ventilator support MV: 8.2 L/min Temp (24hrs), Avg:98.6 F (37 C), Min:98 F (36.7 C), Max:99.6 F (37.6 C)  Drips: Precedex  Medications reviewed and include: colace, SSI q 4 hours, IV protonix, miralax, IV abx  Labs reviewed: sodium 146, potassium 3.3, phosphorus 2.3 CBG's: 80-106 x 24 hours  UOP: 1505 ml x 24 hours I/O's: +6.9 L since admit  NUTRITION - FOCUSED PHYSICAL EXAM:  Flowsheet Row Most Recent Value   Orbital Region No depletion  Upper Arm Region No depletion  Thoracic and Lumbar Region No depletion  Buccal Region Unable to assess  Temple Region No depletion  Clavicle Bone Region No depletion  Clavicle and Acromion Bone Region No depletion  Scapular Bone Region Unable to assess  Dorsal Hand No depletion  Patellar Region No depletion  Anterior Thigh Region No depletion  Posterior Calf Region No depletion  Edema (RD Assessment) Mild  [BLE]  Hair Reviewed  Eyes Reviewed  Mouth Unable to assess  Skin Reviewed  Nails Reviewed       Diet Order:   Diet Order            Diet NPO time specified  Diet effective now                 EDUCATION NEEDS:   No education needs have been identified at this time  Skin:  Skin Assessment: Reviewed RN Assessment  Last BM:  no documented BM  Height:   Ht Readings from Last 1 Encounters:  02/19/21 6' 0.01" (1.829 m)    Weight:   Wt Readings from Last 1 Encounters:  02/22/21 122.1 kg    Ideal Body Weight:  80.9 kg  BMI:  Body mass index is 36.5 kg/m.  Estimated Nutritional Needs:   Kcal:  7371-0626  Protein:  162-182 grams  Fluid:  >/= 1.6 L    Gustavus Bryant, MS, RD, LDN Inpatient Clinical Dietitian Please see AMiON for contact information.

## 2021-02-22 NOTE — Progress Notes (Signed)
eLink Physician-Brief Progress Note Patient Name: Danny Norris DOB: [birth year] MRN: [id number]   Date of Service  02/22/2021  HPI/Events of Note  Patient c/o chest pain/soreness d/t coughing. Creatinine = 1.0 . HR = 52 - Patient is on a Precedex IV infusion.   eICU Interventions  Plan: 1. Motrin suspension 400 mg per tube X 1 now.  2. Hold Precedex IV infusion. Observe HR off of Precedex.     Intervention Category Major Interventions: Other:  Lenell Antu 02/22/2021, 11:27 PM

## 2021-02-22 NOTE — Plan of Care (Signed)
  Problem: Fluid Volume: Goal: Hemodynamic stability will improve Outcome: Progressing   Problem: Clinical Measurements: Goal: Diagnostic test results will improve Outcome: Progressing Goal: Signs and symptoms of infection will decrease Outcome: Progressing   Problem: Respiratory: Goal: Ability to maintain adequate ventilation will improve Outcome: Progressing   Problem: Education: Goal: Knowledge of General Education information will improve Description: Including pain rating scale, medication(s)/side effects and non-pharmacologic comfort measures Outcome: Progressing   Problem: Health Behavior/Discharge Planning: Goal: Ability to manage health-related needs will improve Outcome: Progressing   Problem: Clinical Measurements: Goal: Ability to maintain clinical measurements within normal limits will improve Outcome: Progressing Goal: Will remain free from infection Outcome: Progressing Goal: Diagnostic test results will improve Outcome: Progressing Goal: Respiratory complications will improve Outcome: Progressing Goal: Cardiovascular complication will be avoided Outcome: Progressing   Problem: Activity: Goal: Risk for activity intolerance will decrease Outcome: Progressing   Problem: Nutrition: Goal: Adequate nutrition will be maintained Outcome: Progressing   Problem: Coping: Goal: Level of anxiety will decrease Outcome: Progressing   Problem: Elimination: Goal: Will not experience complications related to bowel motility Outcome: Progressing Goal: Will not experience complications related to urinary retention Outcome: Progressing   Problem: Pain Managment: Goal: General experience of comfort will improve Outcome: Progressing   Problem: Safety: Goal: Ability to remain free from injury will improve Outcome: Progressing   Problem: Skin Integrity: Goal: Risk for impaired skin integrity will decrease Outcome: Progressing   Problem: Consults Goal: Venous  Thromboembolism Patient Education Description: See Patient Education Module for education specifics. Outcome: Progressing Goal: Diagnosis - Venous Thromboembolism (VTE) Description: Choose a selection Outcome: Progressing Goal: Pharmacy Consult for anticoagulation Outcome: Progressing Goal: Skin Care Protocol Initiated - if Braden Score 18 or less Description: If consults are not indicated, leave blank or document N/A Outcome: Progressing Goal: Nutrition Consult-if indicated Outcome: Progressing Goal: Diabetes Guidelines if Diabetic/Glucose > 140 Description: If diabetic or lab glucose is > 140 mg/dl - Initiate Diabetes/Hyperglycemia Guidelines & Document Interventions  Outcome: Progressing   Problem: Phase I Progression Outcomes Goal: Pain controlled with appropriate interventions Outcome: Progressing Goal: Dyspnea controlled at rest (PE) Outcome: Progressing Goal: Tolerating diet Outcome: Progressing Goal: Initial discharge plan identified Outcome: Progressing Goal: Voiding-avoid urinary catheter unless indicated Outcome: Progressing Goal: Hemodynamically stable Outcome: Progressing Goal: Other Phase I Outcomes/Goals Outcome: Progressing   Problem: Phase II Progression Outcomes Goal: Therapeutic drug levels for anticoagulation Outcome: Progressing Goal: 02 sats trending upward/stable (PE) Outcome: Progressing Goal: Discharge plan established Outcome: Progressing Goal: Tolerating diet Outcome: Progressing Goal: Other Phase II Outcomes/Goals Outcome: Progressing   Problem: Phase III Progression Outcomes Goal: 02 sats stabilized Outcome: Progressing Goal: Activity at appropriate level-compared to baseline Description: (UP IN CHAIR FOR HEMODIALYSIS) Outcome: Progressing Goal: Discharge plan remains appropriate-arrangements made Outcome: Progressing Goal: Other Phase III Outcomes/Goals Outcome: Progressing   Problem: Discharge Progression Outcomes Goal: Barriers  To Progression Addressed/Resolved Outcome: Progressing Goal: Discharge plan in place and appropriate Outcome: Progressing Goal: Pain controlled with appropriate interventions Outcome: Progressing Goal: Hemodynamically stable Outcome: Progressing Goal: Complications resolved/controlled Outcome: Progressing Goal: Tolerating diet Outcome: Progressing Goal: Activity appropriate for discharge plan Outcome: Progressing Goal: Other Discharge Outcomes/Goals Outcome: Progressing   Problem: Activity: Goal: Ability to tolerate increased activity will improve Outcome: Progressing   Problem: Respiratory: Goal: Ability to maintain a clear airway and adequate ventilation will improve Outcome: Progressing   Problem: Role Relationship: Goal: Method of communication will improve Outcome: Progressing

## 2021-02-22 NOTE — Progress Notes (Signed)
ANTICOAGULATION CONSULT NOTE   Pharmacy Consult for Heparin >> Apixaban Indication: DVT  Allergies  Allergen Reactions  . Contrast Media [Iodinated Diagnostic Agents] Rash    Patient Measurements: Height: 6' 0.01" (182.9 cm) Weight: 122.1 kg (269 lb 2.9 oz) IBW/kg (Calculated) : 77.62 Heparin Dosing Weight: 102 kg  Vital Signs: Temp: 99.6 F (37.6 C) (05/23 0700) Temp Source: Axillary (05/23 0700) BP: 124/70 (05/23 0700) Pulse Rate: 73 (05/23 0700)  Labs: Recent Labs    02/19/21 1031 02/19/21 1214 02/19/21 1225 02/20/21 0645 02/20/21 1746 02/21/21 0343 02/21/21 0603  HGB 17.6*   < >  --  15.0  --  13.6 14.2  HCT 58.7*   < >  --  45.8  --  40.0 44.8  PLT 234  --   --  172  --   --  152  HEPARINUNFRC  --   --   --   --  0.21*  --  0.43  CREATININE 1.16  --   --  1.27*  --   --  1.04  TROPONINIHS 36*  --  41*  --   --   --   --    < > = values in this interval not displayed.    Estimated Creatinine Clearance: 103.2 mL/min (by C-G formula based on SCr of 1.04 mg/dL).  Assessment: 89 YOM with acute B/L DVTs. Pharmacy consulted for Heparin dosing for anticoagulation.   Heparin level this morning remains therapeutic (HL 0.32 << 0.43, goal of 0.3-0.7) however on low end of goal range. Plans are not to go ahead and transition the patient to oral AC.   Goal of Therapy:  Heparin level 0.3-0.7 units/ml Monitor platelets by anticoagulation protocol: Yes   Plan:  - D/c Heparin - Start Apixaban 10 mg PT bid x 7d followed by 5 mg PT bid - Will continue to monitor for any signs/symptoms of bleeding and will follow up with heparin level in the a.m.   Thank you for allowing pharmacy to be a part of this patient's care.  Georgina Pillion, PharmD, BCPS Clinical Pharmacist Clinical phone for 02/22/2021: [phone] 02/22/2021 10:39 AM   **Pharmacist phone directory can now be found on [email or web address] ([email or web address]).  Listed under [facility].

## 2021-02-23 DIAGNOSIS — J9601 Acute respiratory failure with hypoxia: Secondary | ICD-10-CM | POA: Diagnosis not present

## 2021-02-23 DIAGNOSIS — I82453 Acute embolism and thrombosis of peroneal vein, bilateral: Secondary | ICD-10-CM | POA: Diagnosis not present

## 2021-02-23 DIAGNOSIS — J189 Pneumonia, unspecified organism: Secondary | ICD-10-CM | POA: Diagnosis not present

## 2021-02-23 LAB — PHOSPHORUS: Phosphorus: 2.8 mg/dL (ref 2.5–4.6)

## 2021-02-23 LAB — BASIC METABOLIC PANEL
Anion gap: 5 (ref 5–15)
BUN: 19 mg/dL (ref 6–20)
CO2: 30 mmol/L (ref 22–32)
Calcium: 9.5 mg/dL (ref 8.9–10.3)
Chloride: 111 mmol/L (ref 98–111)
Creatinine, Ser: 0.88 mg/dL (ref 0.61–1.24)
GFR, Estimated: >60 mL/min (ref >60)
Glucose, Bld: 93 mg/dL (ref 70–99)
Potassium: 4.1 mmol/L (ref 3.5–5.1)
Sodium: 146 mmol/L — ABNORMAL HIGH (ref 135–145)

## 2021-02-23 LAB — GLUCOSE, CAPILLARY
Glucose-Capillary: 86 mg/dL (ref 70–99)
Glucose-Capillary: 106 mg/dL — ABNORMAL HIGH (ref 70–99)
Glucose-Capillary: 93 mg/dL (ref 70–99)
Glucose-Capillary: 99 mg/dL (ref 70–99)
Glucose-Capillary: 156 mg/dL — ABNORMAL HIGH (ref 70–99)
Glucose-Capillary: 113 mg/dL — ABNORMAL HIGH (ref 70–99)

## 2021-02-23 LAB — MAGNESIUM: Magnesium: 2 mg/dL (ref 1.7–2.4)

## 2021-02-23 LAB — CBC
HCT: 44.6 % (ref 39.0–52.0)
Hemoglobin: 13.7 g/dL (ref 13.0–17.0)
MCH: 30.6 pg (ref 26.0–34.0)
MCHC: 30.7 g/dL (ref 30.0–36.0)
MCV: 99.8 fL (ref 80.0–100.0)
Platelets: 155 10*3/uL (ref 150–400)
RBC: 4.47 MIL/uL (ref 4.22–5.81)
RDW: 15.6 % — ABNORMAL HIGH (ref 11.5–15.5)
WBC: 6.5 10*3/uL (ref 4.0–10.5)
nRBC: 0 % (ref 0.0–0.2)

## 2021-02-23 MED ORDER — DOCUSATE SODIUM 100 MG PO CAPS
100.0000 mg | ORAL_CAPSULE | Freq: Two times a day (BID) | ORAL | Status: DC
  Administered 2021-02-23 – 2021-02-28 (×10): 100 mg via ORAL
  Filled 2021-02-23 (×10): qty 1

## 2021-02-23 MED ORDER — ORAL CARE MOUTH RINSE
15.0000 mL | Freq: Two times a day (BID) | OROMUCOSAL | Status: DC
  Administered 2021-02-23 – 2021-02-28 (×9): 15 mL via OROMUCOSAL

## 2021-02-23 MED ORDER — APIXABAN 5 MG PO TABS: 5.0000 mg | ORAL_TABLET | Freq: Two times a day (BID) | ORAL | Status: DC

## 2021-02-23 MED ORDER — APIXABAN 5 MG PO TABS
10.0000 mg | ORAL_TABLET | Freq: Two times a day (BID) | ORAL | Status: DC
  Administered 2021-02-23 – 2021-02-28 (×10): 10 mg via ORAL
  Filled 2021-02-23 (×10): qty 2

## 2021-02-23 MED ORDER — DOCUSATE SODIUM 100 MG PO CAPS: 100.0000 mg | ORAL_CAPSULE | Freq: Two times a day (BID) | ORAL | Status: DC

## 2021-02-23 MED ORDER — ASPIRIN 81 MG PO CHEW
81.0000 mg | CHEWABLE_TABLET | Freq: Every day | ORAL | Status: DC
  Administered 2021-02-24 – 2021-02-28 (×5): 81 mg via ORAL
  Filled 2021-02-23 (×5): qty 1

## 2021-02-23 MED ORDER — POLYETHYLENE GLYCOL 3350 17 G PO PACK
17.0000 g | PACK | Freq: Every day | ORAL | Status: DC
  Administered 2021-02-24 – 2021-02-28 (×5): 17 g via ORAL
  Filled 2021-02-23 (×5): qty 1

## 2021-02-23 MED ORDER — ATORVASTATIN CALCIUM 10 MG PO TABS
20.0000 mg | ORAL_TABLET | Freq: Every day | ORAL | Status: DC
  Administered 2021-02-23 – 2021-02-27 (×5): 20 mg via ORAL
  Filled 2021-02-23 (×5): qty 2

## 2021-02-23 MED ORDER — AZITHROMYCIN 500 MG PO TABS
500.0000 mg | ORAL_TABLET | Freq: Every day | ORAL | Status: AC
  Administered 2021-02-24 – 2021-02-25 (×2): 500 mg via ORAL
  Filled 2021-02-23 (×2): qty 1

## 2021-02-23 NOTE — Procedures (Signed)
Extubation Procedure Note  Patient Details:   Name: Danny Norris DOB: 01-Feb-1961 MRN: [id number]   Airway Documentation:    Vent end date: 02/23/21 Vent end time: 0955   Evaluation  O2 sats: currently acceptable Complications: No apparent complications Patient did tolerate procedure well. Bilateral Breath Sounds: Rhonchi   Patient extubated per MD order & placed on 6L  with SpO2 currently 93%. Patient able to speak & has good strong cough. Patient alert/oriented & currently in no distress.  Jacqulynn Cadet 02/23/2021, 10:02 AM

## 2021-02-23 NOTE — Discharge Instructions (Signed)

## 2021-02-23 NOTE — Progress Notes (Addendum)
NAME:  Danny Norris, MRN:  [id number], DOB:  [birth year], LOS: 4 ADMISSION DATE:  02/19/2021, CONSULTATION DATE:  5/20 REFERRING MD:  Stevie Kern, CHIEF COMPLAINT:  5/20   History of Present Illness:  This is a 60 year old male w/ hx as per below. Presented to the ED on 5/10 in acute respiratory distress, room air sats 60s. Oriented on arrival. Reported about 2 wk h/o generalized weakness, HAs, myalgia, w/ body aches and chills. He is NOT vaccinated against COVID. Over the week prior to presentation got progressively short of breath. While in ER went from 60% room air sats on arrival, initially stabilized on high flow oxygen then acutely became diaphoretic, stopped breathing and was urgently intubated. Pulm/critical care asked to admit  He remains in the 37M ICU in critical condition.   Pertinent  Medical History  MVP and MVR(w/ nml CAs), HTN, hyperlipidemia. 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 Hypoxic respiratory failure on arrival. Following what sounded like ~ 2 week viral prodrome. PCXR R>L airspace disease and RLL ATX. Intubated after failing high flow and progressing to resp arrest. Cultures sent. Azith, rocephin & vanc started. COVID & influenza PCR neg  . Lower extremity Doppler ultrasound 5/21 >> Acute DVT in the right peroneal, left peroneal, left posterior tibial veins . Echocardiogram 5/21 >> EF 60-65% mod LVH, MV prolapse, RVSF and size normal  Interim History / Subjective:  Precedex stopped at 4AM d/t bradycardia  No other acute events overnight  Tmax 99.6 HR 53-105 BP 89/65-152/64  1.5L UOP, +318ml 24 hr, +7.4 L admit.  Unable to obtain subjective evaluation due to patient status  Objective   Blood pressure (!) 119/98, pulse 77, temperature 97.8 F (36.6 C), temperature source Axillary, resp. rate (!) 23, height 6' 0.01" (1.829 m), weight  122.1 kg, SpO2 95 %.    Vent Mode: CPAP;PSV FiO2 (%):  [45 %-50 %] 45 % Set Rate:  [14 bmp] 14 bmp Vt Set:  [620 mL] 620 mL PEEP:  [5 cmH20] 5 cmH20 Pressure Support:  [8 cmH20] 8 cmH20 Plateau Pressure:  [12 cmH20-18 cmH20] 18 cmH20   Intake/Output Summary (Last 24 hours) at 02/23/2021 0941 Last data filed at 02/23/2021 0900 Gross per 24 hour  Intake 1938.34 ml  Output 1585 ml  Net 353.34 ml   Filed Weights   02/20/21 0500 02/21/21 0500 02/22/21 0410  Weight: 117.9 kg 121.4 kg 122.1 kg    Examination: General: in bed, no acute distress, interactive HEENT: MM pink/moist, anicteric, trachea midline, ETT, OGT Neuro: GCS11T, RASS 0, PERRL 67mm, MAE CV: S1S2, NSR, no m/r/g appreciated PULM:  Air movement in all lobes, white/clear thin secretions, chest expansion symmetric GI: soft, bsx4 active, nontender   Extremities: warm/dry, trace pretibial edema, capillary refill less than 3 seconds  Skin: no rashes or lesions noted    Labs/imaging that I havepersonally reviewed  (right click and "Reselect all SmartList Selections" daily)  CBC, BMP  Resolved Hospital Problem list     Assessment & Plan:   Acute Hypoxic respiratory failure (POA) secondary to CAP (NOS to date).  PCXR w/ right > left airspace disease and elevated right HD. This is following what sounded like viral prodrome but at this point either bacterial or viral on ddx. WBC 8.7>7.1> 6.5. Tmax 99.6  Plan -LTVV strategy with tidal volumes of 4-8 cc/kg ideal body weight -  Goal plateau pressures of 30 and driving pressures of 15 -Wean PEEP/FiO2 for SpO2 greater than 90 -Follow intermittent CXR and ABG PRN -On SBT currently. Plan to evaluate for extubation in an hour. -Precedex stopped overnight. Not currently on sedatives. Continue PRN fent push 25-71mcg. Goal CPOT 0-2. -Continue rocephin and azithromycin for full 7 days. Started 5/21 -AM CXR - BC NGTD day 4. Continue to follow.  Circulatory shock- Improved Developed  post-intubation. Multifactorial:septic shock, hypovolemia and medication. Remains off pressors Plan -continue holding home antihypertensives. Consider restarting tomorrow cautiously.   Acute bilateral lower extremity DVT, at risk PE Heparin drip stopped yesterday, switched to apixaban Plan -continue apixaban -appreciate pharmacy assistance   Hyperglycemia BG 86 to 121 Plan -Continue SSI -Blood Glucose goal 140-180.  H/o hypertension, MVP w/ MVR, mild Pulm hypertension and hyperlipidemia Plan -Continue lipitor -Continue holding metoprolol, HCTZ, lotrel  Electrolyte abnormalities, Hypernatremia Phos 2.3>2.7>2.8, MG 2.0, NA 161>096>045 -Na stable -continue to monitor on AM BMP  Best practice (right click and "Reselect all SmartList Selections" daily)  Diet:  Tube Feed  Pain/Anxiety/Delirium protocol (if indicated): Yes (RASS goal 0) VAP protocol (if indicated): Yes DVT prophylaxis: Systemic AC GI prophylaxis: PPI Glucose control:  SSI Yes Central venous access:  N/A Arterial line:  N/A Foley:  N/A Mobility:  bed rest  PT consulted: N/A Last date of multidisciplinary goals of care discussion [pending] Code Status:  full code Disposition: ICU Family: Discussed status and plans with patient's sister in law at bedside 5/23    Critical care time: 31 minutes   Gershon Mussel., MSN, APRN, AGACNP-BC [facility]  02/23/2021 , 9:41 AM  Please see [email or web address] for pager details  If no response, please call [phone] After hours, please call Elink at [phone]

## 2021-02-23 NOTE — Plan of Care (Signed)
  Problem: Fluid Volume: Goal: Hemodynamic stability will improve Outcome: Progressing   Problem: Clinical Measurements: Goal: Diagnostic test results will improve Outcome: Progressing Goal: Signs and symptoms of infection will decrease Outcome: Progressing   Problem: Respiratory: Goal: Ability to maintain adequate ventilation will improve Outcome: Progressing   Problem: Education: Goal: Knowledge of General Education information will improve Description: Including pain rating scale, medication(s)/side effects and non-pharmacologic comfort measures Outcome: Progressing   Problem: Health Behavior/Discharge Planning: Goal: Ability to manage health-related needs will improve Outcome: Progressing   Problem: Clinical Measurements: Goal: Ability to maintain clinical measurements within normal limits will improve Outcome: Progressing Goal: Will remain free from infection Outcome: Progressing Goal: Diagnostic test results will improve Outcome: Progressing Goal: Respiratory complications will improve Outcome: Progressing Goal: Cardiovascular complication will be avoided Outcome: Progressing   Problem: Activity: Goal: Risk for activity intolerance will decrease Outcome: Progressing   Problem: Nutrition: Goal: Adequate nutrition will be maintained Outcome: Progressing   Problem: Coping: Goal: Level of anxiety will decrease Outcome: Progressing   Problem: Elimination: Goal: Will not experience complications related to bowel motility Outcome: Progressing Goal: Will not experience complications related to urinary retention Outcome: Progressing   Problem: Pain Managment: Goal: General experience of comfort will improve Outcome: Progressing   Problem: Safety: Goal: Ability to remain free from injury will improve Outcome: Progressing   Problem: Skin Integrity: Goal: Risk for impaired skin integrity will decrease Outcome: Progressing   Problem: Consults Goal: Venous  Thromboembolism Patient Education Description: See Patient Education Module for education specifics. Outcome: Progressing Goal: Diagnosis - Venous Thromboembolism (VTE) Description: Choose a selection Outcome: Progressing Goal: Pharmacy Consult for anticoagulation Outcome: Progressing Goal: Skin Care Protocol Initiated - if Braden Score 18 or less Description: If consults are not indicated, leave blank or document N/A Outcome: Progressing Goal: Nutrition Consult-if indicated Outcome: Progressing Goal: Diabetes Guidelines if Diabetic/Glucose > 140 Description: If diabetic or lab glucose is > 140 mg/dl - Initiate Diabetes/Hyperglycemia Guidelines & Document Interventions  Outcome: Progressing   Problem: Phase I Progression Outcomes Goal: Pain controlled with appropriate interventions Outcome: Progressing Goal: Dyspnea controlled at rest (PE) Outcome: Progressing Goal: Tolerating diet Outcome: Progressing Goal: Initial discharge plan identified Outcome: Progressing Goal: Voiding-avoid urinary catheter unless indicated Outcome: Progressing Goal: Hemodynamically stable Outcome: Progressing Goal: Other Phase I Outcomes/Goals Outcome: Progressing   Problem: Phase II Progression Outcomes Goal: Therapeutic drug levels for anticoagulation Outcome: Progressing Goal: 02 sats trending upward/stable (PE) Outcome: Progressing Goal: Discharge plan established Outcome: Progressing Goal: Tolerating diet Outcome: Progressing Goal: Other Phase II Outcomes/Goals Outcome: Progressing   Problem: Phase III Progression Outcomes Goal: 02 sats stabilized Outcome: Progressing Goal: Activity at appropriate level-compared to baseline Description: (UP IN CHAIR FOR HEMODIALYSIS) Outcome: Progressing Goal: Discharge plan remains appropriate-arrangements made Outcome: Progressing Goal: Other Phase III Outcomes/Goals Outcome: Progressing   Problem: Discharge Progression Outcomes Goal: Barriers  To Progression Addressed/Resolved Outcome: Progressing Goal: Discharge plan in place and appropriate Outcome: Progressing Goal: Pain controlled with appropriate interventions Outcome: Progressing Goal: Hemodynamically stable Outcome: Progressing Goal: Complications resolved/controlled Outcome: Progressing Goal: Tolerating diet Outcome: Progressing Goal: Activity appropriate for discharge plan Outcome: Progressing Goal: Other Discharge Outcomes/Goals Outcome: Progressing   Problem: Activity: Goal: Ability to tolerate increased activity will improve Outcome: Progressing   Problem: Respiratory: Goal: Ability to maintain a clear airway and adequate ventilation will improve Outcome: Progressing   Problem: Role Relationship: Goal: Method of communication will improve Outcome: Progressing   

## 2021-02-23 NOTE — Progress Notes (Signed)
Called Luken T Dooner's sister Bohdan Macho at [phone] to update her on her brothers condition. Updated over the phone.  Gershon Mussel., MSN, APRN, AGACNP-BC [facility]  02/23/2021 , 5:37 PM  Please see [email or web address] for pager details  If no response, please call [phone] After hours, please call Elink at [phone]

## 2021-02-24 ENCOUNTER — Inpatient Hospital Stay (HOSPITAL_COMMUNITY): Payer: BC Managed Care – PPO

## 2021-02-24 DIAGNOSIS — J189 Pneumonia, unspecified organism: Secondary | ICD-10-CM | POA: Diagnosis not present

## 2021-02-24 DIAGNOSIS — I82453 Acute embolism and thrombosis of peroneal vein, bilateral: Secondary | ICD-10-CM | POA: Diagnosis not present

## 2021-02-24 DIAGNOSIS — J9601 Acute respiratory failure with hypoxia: Secondary | ICD-10-CM | POA: Diagnosis not present

## 2021-02-24 LAB — CULTURE, BLOOD (ROUTINE X 2)
Culture: NO GROWTH
Culture: NO GROWTH
Special Requests: ADEQUATE
Special Requests: ADEQUATE

## 2021-02-24 LAB — BASIC METABOLIC PANEL
Anion gap: 9 (ref 5–15)
BUN: 10 mg/dL (ref 6–20)
CO2: 32 mmol/L (ref 22–32)
Calcium: 10 mg/dL (ref 8.9–10.3)
Chloride: 103 mmol/L (ref 98–111)
Creatinine, Ser: 0.74 mg/dL (ref 0.61–1.24)
GFR, Estimated: >60 mL/min (ref >60)
Glucose, Bld: 137 mg/dL — ABNORMAL HIGH (ref 70–99)
Potassium: 4.3 mmol/L (ref 3.5–5.1)
Sodium: 144 mmol/L (ref 135–145)

## 2021-02-24 LAB — CBC
HCT: 51.4 % (ref 39.0–52.0)
Hemoglobin: 15.7 g/dL (ref 13.0–17.0)
MCH: 30.3 pg (ref 26.0–34.0)
MCHC: 30.5 g/dL (ref 30.0–36.0)
MCV: 99 fL (ref 80.0–100.0)
Platelets: 191 10*3/uL (ref 150–400)
RBC: 5.19 MIL/uL (ref 4.22–5.81)
RDW: 14.9 % (ref 11.5–15.5)
WBC: 8.9 10*3/uL (ref 4.0–10.5)
nRBC: 0 % (ref 0.0–0.2)

## 2021-02-24 LAB — GLUCOSE, CAPILLARY
Glucose-Capillary: 100 mg/dL — ABNORMAL HIGH (ref 70–99)
Glucose-Capillary: 110 mg/dL — ABNORMAL HIGH (ref 70–99)
Glucose-Capillary: 131 mg/dL — ABNORMAL HIGH (ref 70–99)
Glucose-Capillary: 134 mg/dL — ABNORMAL HIGH (ref 70–99)
Glucose-Capillary: 191 mg/dL — ABNORMAL HIGH (ref 70–99)
Glucose-Capillary: 95 mg/dL (ref 70–99)

## 2021-02-24 MED ORDER — INSULIN ASPART 100 UNIT/ML IJ SOLN
0.0000 [IU] | Freq: Every day | INTRAMUSCULAR | Status: DC
  Administered 2021-02-27: 2 [IU] via SUBCUTANEOUS

## 2021-02-24 MED ORDER — BISACODYL 10 MG RE SUPP
10.0000 mg | Freq: Once | RECTAL | Status: DC
  Filled 2021-02-24: qty 1

## 2021-02-24 MED ORDER — ENSURE ENLIVE PO LIQD
237.0000 mL | Freq: Two times a day (BID) | ORAL | Status: DC
  Administered 2021-02-24 – 2021-02-26 (×4): 237 mL via ORAL

## 2021-02-24 MED ORDER — NICARDIPINE HCL IN NACL 20-0.86 MG/200ML-% IV SOLN
3.0000 mg/h | INTRAVENOUS | Status: DC
  Administered 2021-02-24 (×2): 5 mg/h via INTRAVENOUS
  Filled 2021-02-24 (×4): qty 200

## 2021-02-24 MED ORDER — HYDRALAZINE HCL 25 MG PO TABS
25.0000 mg | ORAL_TABLET | Freq: Four times a day (QID) | ORAL | Status: DC | PRN
  Administered 2021-02-24 (×2): 25 mg via ORAL
  Filled 2021-02-24 (×2): qty 1

## 2021-02-24 MED ORDER — HYDROCHLOROTHIAZIDE 25 MG PO TABS
25.0000 mg | ORAL_TABLET | Freq: Every day | ORAL | Status: DC
  Administered 2021-02-24 – 2021-02-28 (×5): 25 mg via ORAL
  Filled 2021-02-24 (×5): qty 1

## 2021-02-24 MED ORDER — FUROSEMIDE 10 MG/ML IJ SOLN
40.0000 mg | Freq: Once | INTRAMUSCULAR | Status: AC
  Administered 2021-02-24: 40 mg via INTRAVENOUS
  Filled 2021-02-24: qty 4

## 2021-02-24 MED ORDER — FUROSEMIDE 10 MG/ML IJ SOLN
40.0000 mg | Freq: Two times a day (BID) | INTRAMUSCULAR | Status: DC
  Administered 2021-02-24: 40 mg via INTRAVENOUS
  Filled 2021-02-24: qty 4

## 2021-02-24 MED ORDER — INSULIN ASPART 100 UNIT/ML IJ SOLN
0.0000 [IU] | Freq: Three times a day (TID) | INTRAMUSCULAR | Status: DC
  Administered 2021-02-24: 2 [IU] via SUBCUTANEOUS
  Administered 2021-02-25 – 2021-02-26 (×2): 1 [IU] via SUBCUTANEOUS
  Administered 2021-02-26: 2 [IU] via SUBCUTANEOUS
  Administered 2021-02-27 – 2021-02-28 (×2): 1 [IU] via SUBCUTANEOUS

## 2021-02-24 NOTE — Progress Notes (Signed)
eLink Physician-Brief Progress Note Patient Name: Danny Norris DOB: 04-Nov-1960 MRN: [id number]   Date of Service  02/24/2021  HPI/Events of Note  Hypertension - BP = 142/108 and 180/111   eICU Interventions  Plan: 1. Hydralazine 50 mg PO Q 6 hours pRN SBP > 170 or DBP > 100.     Intervention Category Major Interventions: Hypertension - evaluation and management  Zelma Snead Eugene 02/24/2021, 12:24 AM

## 2021-02-24 NOTE — Progress Notes (Signed)
eLink Physician-Brief Progress Note Patient Name: Danny Norris DOB: [birth year] MRN: [id number]   Date of Service  02/24/2021  HPI/Events of Note  Review of portable CXR reveals elevated R hemidiaphragm (old finding) and there appears to be some element of pulmonary vascular congestion. BP now = 196/115 with MAP - 136. Hypertension related CHF?  eICU Interventions  Plan: 1. Nicardipine IV infusion. Titrate to SBP < 170.     Intervention Category Major Interventions: Hypertension - evaluation and management  [name] 02/24/2021, 1:49 AM

## 2021-02-24 NOTE — Progress Notes (Signed)
eLink Physician-Brief Progress Note Patient Name: Danny Norris DOB: 29-Jul-1961 MRN: [id number]   Date of Service  02/24/2021  HPI/Events of Note  Review of radiology reading of CXR: Interval extubation.  Persistent pulmonary hypoinflation. Interval development of right middle and lower lobe collapse. Probable discoid atelectasis within the right upper lobe. Interval development of reticulonodular infiltrate throughout the left lung suspicious for atypical infection in the appropriate clinical setting. Stable cardiomegaly.  eICU Interventions  Plan: 1. Will request that the PCCM ground team evaluate the patient at bedside d/t new RLL/RML collapse and new reticulonodular infiltrate throughout the L lung suspicious for atypical infection     Intervention Category Major Interventions: Hypoxemia - evaluation and management  Danny Norris 02/24/2021, 2:23 AM

## 2021-02-24 NOTE — Progress Notes (Signed)
Updated Danny Norris's sister Danny Norris at bedside regarding her brothers condition and events overnight.   Danny Norris., MSN, APRN, AGACNP-BC [facility]  02/24/2021 , 3:10 PM  Please see [email or web address] for pager details  If no response, please call [phone] After hours, please call Elink at [phone]

## 2021-02-24 NOTE — Progress Notes (Signed)
RT note. Patient placed on bipap at this time, 14/5, 70%, VSS, RT will continue to monitor

## 2021-02-24 NOTE — Progress Notes (Signed)
eLink Physician-Brief Progress Note Patient Name: Danny Norris DOB: Dec 05, 1960 MRN: [id number]   Date of Service  02/24/2021  HPI/Events of Note  Hypertension - BP is now 192/114 1 hour post getting Hydralazine PO. Patient with increased O2 requirement. Now on 100% NRB. Have to wonder just how much his hypertension is related to increased WOB. He is positive 6 liters from an I/O perspective since admission.   eICU Interventions  Plan: 1. Lasix 40 mg IV X 1 now. 2. Portable CXR STAT.      Intervention Category Major Interventions: Hypoxemia - evaluation and management  [name] 02/24/2021, 1:34 AM

## 2021-02-24 NOTE — Progress Notes (Signed)
NAME:  Danny Norris, MRN:  [id number], DOB:  05/27/1961, LOS: 5 ADMISSION DATE:  02/19/2021, CONSULTATION DATE:  5/20 REFERRING MD:  Stevie Kern, CHIEF COMPLAINT:  5/20   History of Present Illness:  This is a 60 year old male w/ hx as per below. Presented to the ED on 5/10 in acute respiratory distress, room air sats 60s. Oriented on arrival. Reported about 2 wk h/o generalized weakness, HAs, myalgia, w/ body aches and chills. He is NOT vaccinated against COVID. Over the week prior to presentation got progressively short of breath. While in ER went from 60% room air sats on arrival, initially stabilized on high flow oxygen then acutely became diaphoretic, stopped breathing and was urgently intubated. Pulm/critical care asked to admit  He remains in 40M in critical condition  Pertinent  Medical History  MVP and MVR(w/ nml CAs), HTN, hyperlipidemia. 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 Hypoxic respiratory failure on arrival. Following what sounded like ~ 2 week viral prodrome. PCXR R>L airspace disease and RLL ATX. Intubated after failing high flow and progressing to resp arrest. Cultures sent. Azith, rocephin & vanc started. COVID & influenza PCR neg  . Lower extremity Doppler ultrasound 5/21 >> Acute DVT in the right peroneal, left peroneal, left posterior tibial veins . Echocardiogram 5/21 >> EF 60-65% mod LVH, MV prolapse, RVSF and size normal   Interim History / Subjective:  Worsening respiratory failure overnight. 6L increased to NRB. Placed on BiPAP  Hypertensive crisis. Hydralazine given then started on nicardipine gtt  Tmax 99.4, 4L uop yesterday  Nicardipine drip at 5mg  upon exam  Denies chest pain, endorses feeling short of breath. Denies pain otherwise.  Objective   Blood pressure (!) 151/94, pulse 96, temperature 99.1 F (37.3 C), temperature  source Axillary, resp. rate 15, height 6' 0.01" (1.829 m), weight 122.1 kg, SpO2 (!) 88 %.     Vent Mode: PCV;BIPAP FiO2 (%):  [70 %] 70 % Set Rate:  [15 bmp] 15 bmp PEEP:  [5 cmH20] 5 cmH20 Pressure Support:  [10 cmH20] 10 cmH20   Intake/Output Summary (Last 24 hours) at 02/24/2021 1101 Last data filed at 02/24/2021 1000 Gross per 24 hour  Intake 1438.54 ml  Output 6126 ml  Net -4687.46 ml   Filed Weights   02/21/21 0500 02/22/21 0410 02/24/21 0331  Weight: 121.4 kg 122.1 kg 122.1 kg    Examination: General:  In bed, obese, on bipap HEENT: MM pink/moist, anicteric, trachea midline  Neuro: GCS 15, RASS 0, PERRL 81mm CV: S1S2, NSR, no m/r/g appreciated PULM:  Airflow througout all lobes, diminished in RLL, chest expansion symmetric GI: soft, bsx4 active, non tender   Extremities: warm/dry, trace pretibial edema, capillary refill less than 3 seconds  Skin: no rashes or lesions noted  Labs/imaging that I havepersonally reviewed  (right click and "Reselect all SmartList Selections" daily)  CBC, BMP, CXR  Resolved Hospital Problem list   Circulatory shock Hypernatremia Assessment & Plan:   Acute Hypoxic respiratory failure (POA) secondary to CAP (NOS to date).  Hx OSA, non compliant at home with CPAP Extubated afternoon 5/24. Overnight O2 requirement increased 6 to 15 to NRB to CPAP 70%/5/10  WBC 7.1> 6.5>7.2. Tmax 99.4 CXR concerning for right basilar volume loss. Worsening respiratory failure could be associated with loss of positive pressure, positive fluid volume, and HTN. x1 furosemide overnight Plan -Continue  mandatory BiPAP at night with rest breaks during the day -Aggressive pulmonary hygiene with IS, OOB, coughing, and bed chest physiotherapy  -40mg  IV furosemide -Resuming some home HTN thearpy (discussed below) -Continue Rocephin and azithromycin for full 7 days. Stop on 5/27 -CXR at 1300 and AM to evaluate progress with aeration.   Acute bilateral lower extremity  DVT, at risk PE Plan -appreciate pharmacy assistance -continue eliquis -Too unstable at this time for chest ct to evaluate for PE. On systemic AC. Will consider chest ct when more stable.   Hypertensive Crisis Hx hypertension, MVP w/ MVR, mild Pulm hypertension and hyperlipidemia Hypertensive overnight. SBP documented 196. Increased oxygen needs. Hydralazine given and nicardipine gtt initiated. x1 dose furosemide. -3.5L 24 hours, +3.9L since admission Plan -Furosemide 40mg  x1, follow up output -Restarting home HCTZ, will evaluate response, will consider starting 12.5mg  BID metoprolol (1/2 home dose) in afternoon -Wean off nicardipine drip. Goal SBP below 160 -Continue PRN hydralazine IV  for SBP greater than 170 -Continue lipitor  Hyperglycemia BG 93 to 156 Plan -Continue SSI sensitive, switching to ACHS -Blood Glucose goal 140-180.  Constipation No bowel movement this admission P -Ducolax suppository now -Continue colace and miralax  Best practice (right click and "Reselect all SmartList Selections" daily)  Diet:  Oral Pain/Anxiety/Delirium protocol (if indicated): No VAP protocol (if indicated): Not indicated DVT prophylaxis: Systemic AC GI prophylaxis: N/A and PPI Glucose control:  SSI Yes Central venous access:  N/A Arterial line:  N/A Foley:  N/A Mobility:  OOB  PT consulted: Yes Last date of multidisciplinary goals of care discussion [pending] Code Status:  full code Disposition: ICU Family: Discussed status and plans with patient's sister in law over phone 5/24    Critical care time: 35 minutes   ., MSN, APRN, AGACNP-BC [facility]  02/24/2021 , 11:01 AM  Please see [email or web address] for pager details  If no response, please call [phone] After hours, please call Elink at [phone]

## 2021-02-24 NOTE — Progress Notes (Signed)
Nutrition Follow-up  DOCUMENTATION CODES:   Obesity unspecified  INTERVENTION:   - Ensure Enlive po BID, each supplement provides 350 kcal and 20 grams of protein  - Encourage adequate PO intake  NUTRITION DIAGNOSIS:   Inadequate oral intake related to inability to eat as evidenced by NPO status.  Progressing, diet advanced to Heart Healthy  GOAL:   Patient will meet greater than or equal to 90% of their needs  Progressing  MONITOR:   PO intake,Supplement acceptance,Labs,Weight trends,I & O's  REASON FOR ASSESSMENT:   Ventilator,Consult Enteral/tube feeding initiation and management  ASSESSMENT:   60 year old male who presented to the ED on 5/20 with SOB. PMH of HTN, HLD, MVR. Pt was intubated in the ED. Pt admitted with acute hypoxic respiratory failure with viral prodrome (COVID negative), CAP, and septic shock.  5/24 - extubated  Discussed pt with RN and during ICU rounds. Spoke with pt at bedside. Pt reports good appetite. He states that his throat is a little bit sore from being intubated but denies any issues swallowing. RD assisted pt in placing lunch order.  RD discussed importance of adequate PO intake with healing and maintaining lean muscle mass. Pt expresses understanding. Pt willing to consume oral nutrition supplements to help meet nutritional needs. RD to order Ensure Enlive.  Admit weight: 116.2 kg Current weight: 122.1 kg  Meal Completion: 100%  Medications reviewed and include: dulcolax suppository, colace, SSI q 4 hours, miralax, IV abx, cardene gtt  Labs reviewed. CBG's: 95-156 x 24 hours  UOP: 4975 ml x 24 hours I/O's: +4.0 L since admit  Diet Order:   Diet Order            Diet Heart Room service appropriate? Yes; Fluid consistency: Thin  Diet effective now                 EDUCATION NEEDS:   No education needs have been identified at this time  Skin:  Skin Assessment: Reviewed RN Assessment  Last BM:  02/23/21 medium type  2  Height:   Ht Readings from Last 1 Encounters:  02/19/21 6' 0.01" (1.829 m)    Weight:   Wt Readings from Last 1 Encounters:  02/24/21 122.1 kg    Ideal Body Weight:  80.9 kg  BMI:  Body mass index is 36.5 kg/m.  Estimated Nutritional Needs:   Kcal:  2200-2400  Protein:  105-125 grams  Fluid:  >/= 2.0 L    Mertie Clause, MS, RD, LDN Inpatient Clinical Dietitian Please see AMiON for contact information.

## 2021-02-24 NOTE — Evaluation (Signed)
Occupational Therapy Evaluation Patient Details Name: Danny Norris MRN: [id number] DOB: [birth year] Today's Date: 02/24/2021    History of Present Illness This is a 60 year old male presented to the ED on 5/10 in acute respiratory distress, room air sats 60s. Oriented on arrival. Reported about 2 wk h/o generalized weakness, HAs, myalgia, w/ body aches and chills. He is NOT vaccinated against COVID. Intubated 5/20-5/24.  Work up for PNA and sepsis.   Clinical Impression   PTA patient was living with his parents in a private residence and was grossly I with ADLs/IADLs without AD. Patient currently functioning near baseline demonstrating bed mobility with I and functional transfers with Min guard without AD. Session limited as patient on 100% NRB. Patient also limited by deficits listed below including generalized weakness and mild balance deficits and would benefit from continued acute OT services in prep for safe d/c home. OT will continue to follow acutely to maximize safety/independnece with self-care tasks.      Follow Up Recommendations  No OT follow up    Equipment Recommendations  None recommended by OT    Recommendations for Other Services       Precautions / Restrictions Precautions Precautions: Fall Restrictions Weight Bearing Restrictions: No      Mobility Bed Mobility Overal bed mobility: Independent                  Transfers Overall transfer level: Needs assistance Equipment used: None Transfers: Sit to/from Stand;Stand Pivot Transfers Sit to Stand: Min guard;Supervision Stand pivot transfers: Min guard       General transfer comment: Steadying assist for pivot only. Pt fairly steady on his feet. Nurse wanted bed to chair only due to pt on NRB and pt desats without NRB.    Balance Overall balance assessment: Needs assistance Sitting-balance support: No upper extremity supported;Feet supported Sitting balance-Leahy Scale: Fair     Standing balance  support: No upper extremity supported;During functional activity Standing balance-Leahy Scale: Fair Standing balance comment: can stand statically without UE support                           ADL either performed or assessed with clinical judgement   ADL Overall ADL's : Needs assistance/impaired                     Lower Body Dressing: Minimal assistance Lower Body Dressing Details (indicate cue type and reason): Able to doff footwear seated EOB with Min A. Toilet Transfer: Hydrographic surveyor Details (indicate cue type and reason): Simulated with transfer to recliner without AD.                 Vision Baseline Vision/History: Wears glasses Wears Glasses: At all times Patient Visual Report: No change from baseline       Perception     Praxis      Pertinent Vitals/Pain Pain Assessment: No/denies pain     Hand Dominance Right   Extremity/Trunk Assessment Upper Extremity Assessment Upper Extremity Assessment: Generalized weakness   Lower Extremity Assessment Lower Extremity Assessment: Defer to PT evaluation   Cervical / Trunk Assessment Cervical / Trunk Assessment: Normal   Communication Communication Communication: No difficulties   Cognition Arousal/Alertness: Awake/alert Behavior During Therapy: WFL for tasks assessed/performed Overall Cognitive Status: Within Functional Limits for tasks assessed  General Comments  100 bpm, 141/91, 99% 100% NRB    Exercises Exercises: General Lower Extremity General Exercises - Lower Extremity Long Arc Quad: AROM;Both;10 reps;Seated   Shoulder Instructions      Home Living Family/patient expects to be discharged to:: Private residence Living Arrangements: Parent (lives with parents and sister in law) Available Help at Discharge: Family;Available 24 hours/day Type of Home: House Home Access: Stairs to enter [employer] of  Steps: 4 Entrance Stairs-Rails: Right;Left;Can reach both Home Layout: Bed/bath upstairs;Two level Alternate Level Stairs-Number of Steps: 10 Alternate Level Stairs-Rails: Right;Left;Can reach both Bathroom Shower/Tub: Chief Strategy Officer: Standard     Home Equipment: Grab bars - tub/shower;Hand held shower head          Prior Functioning/Environment Level of Independence: Independent                 OT Problem List: Decreased strength;Impaired balance (sitting and/or standing)      OT Treatment/Interventions: Self-care/ADL training;Therapeutic exercise;Therapeutic activities;Patient/family education;Balance training    OT Goals(Current goals can be found in the care plan section) Acute Rehab OT Goals Patient Stated Goal: to go home OT Goal Formulation: With patient Time For Goal Achievement: 03/10/21 Potential to Achieve Goals: Good ADL Goals Pt Will Perform Grooming: Independently;standing Pt Will Perform Upper Body Dressing: Independently Pt Will Perform Lower Body Dressing: Independently;sit to/from stand Pt Will Transfer to Toilet: Independently Pt Will Perform Toileting - Clothing Manipulation and hygiene: Independently;sit to/from stand Pt/caregiver will Perform Home Exercise Program: Both right and left upper extremity  OT Frequency: Min 2X/week   Barriers to D/C:            Co-evaluation              AM-PAC OT "6 Clicks" Daily Activity     Outcome Measure Help from another person eating meals?: None Help from another person taking care of personal grooming?: A Little Help from another person toileting, which includes using toliet, bedpan, or urinal?: A Little Help from another person bathing (including washing, rinsing, drying)?: A Little Help from another person to put on and taking off regular upper body clothing?: A Little Help from another person to put on and taking off regular lower body clothing?: A Little 6 Click Score:  19   End of Session Equipment Utilized During Treatment: Oxygen Nurse Communication: Mobility status  Activity Tolerance: Patient tolerated treatment well Patient left: in chair;with call bell/phone within reach;with chair alarm set  OT Visit Diagnosis: Muscle weakness (generalized) (M62.81)                Time: 1320-1330 OT Time Calculation (min): 10 min Charges:  OT General Charges $OT Visit: 1 Visit OT Evaluation $OT Eval Low Complexity: 1 Low  Audric Venn H. OTR/L Supplemental OT, Department of rehab services [phone]  Kitzia Camus R H. 02/24/2021, 2:24 PM

## 2021-02-24 NOTE — Evaluation (Signed)
Physical Therapy Evaluation Patient Details Name: Danny Norris MRN: [id number] DOB: 09-10-1961 Today's Date: 02/24/2021   History of Present Illness  This is a 60 year old male presented to the ED on 5/10 in acute respiratory distress, room air sats 60s. Oriented on arrival. Reported about 2 wk h/o generalized weakness, HAs, myalgia, w/ body aches and chills. He is NOT vaccinated against COVID. Intubated 5/20-5/24.  Work up for PNA and sepsis.  Clinical Impression  Pt admitted with above diagnosis. Pt was able to stand and pivot to get to chair with min guard assist. Pt steady overall. Anticipate good progress once respiratory O2 requirements decrease.  Pt on NRB today and pt not pushed too much due to that. VSS during session. Pt currently with functional limitations due to the deficits listed below (see PT Problem List). Pt will benefit from skilled PT to increase their independence and safety with mobility to allow discharge to the venue listed below.      Follow Up Recommendations No PT follow up    Equipment Recommendations  None recommended by PT    Recommendations for Other Services       Precautions / Restrictions Precautions Precautions: Fall Restrictions Weight Bearing Restrictions: No      Mobility  Bed Mobility Overal bed mobility: Independent                  Transfers Overall transfer level: Needs assistance Equipment used: None Transfers: Sit to/from Stand;Stand Pivot Transfers Sit to Stand: Min guard;Supervision Stand pivot transfers: Min guard       General transfer comment: Steadying assist for pivot only. Pt fairly steady on his feet.  Nurse wanted bed to chair only due to pt on NRB and pt desats without NRB.  Ambulation/Gait             General Gait Details: TBA  Stairs            Wheelchair Mobility    Modified Rankin (Stroke Patients Only)       Balance Overall balance assessment: Needs assistance Sitting-balance  support: No upper extremity supported;Feet supported Sitting balance-Leahy Scale: Fair     Standing balance support: No upper extremity supported;During functional activity Standing balance-Leahy Scale: Fair Standing balance comment: can stand statically without UE support                             Pertinent Vitals/Pain Pain Assessment: No/denies pain    Home Living Family/patient expects to be discharged to:: Private residence Living Arrangements: Parent (lives with parents and sister in Social worker) Available Help at Discharge: Family;Available 24 hours/day Type of Home: House Home Access: Stairs to enter Entrance Stairs-Rails: Right;Left;Can reach both Entrance Stairs-Number of Steps: 4 Home Layout: Bed/bath upstairs;Two level Home Equipment: Grab bars - tub/shower;Hand held shower head      Prior Function Level of Independence: Independent               Hand Dominance   Dominant Hand: Right    Extremity/Trunk Assessment   Upper Extremity Assessment Upper Extremity Assessment: Defer to OT evaluation    Lower Extremity Assessment Lower Extremity Assessment: Generalized weakness    Cervical / Trunk Assessment Cervical / Trunk Assessment: Normal  Communication   Communication: No difficulties  Cognition Arousal/Alertness: Awake/alert Behavior During Therapy: WFL for tasks assessed/performed Overall Cognitive Status: Within Functional Limits for tasks assessed  General Comments General comments (skin integrity, edema, etc.): 100 bpm, 141/91, 99% 100% NRB,    Exercises General Exercises - Lower Extremity Long Arc Quad: AROM;Both;10 reps;Seated   Assessment/Plan    PT Assessment Patient needs continued PT services  PT Problem List Decreased activity tolerance;Decreased balance;Decreased mobility;Decreased knowledge of use of DME;Decreased safety awareness;Decreased knowledge of  precautions;Cardiopulmonary status limiting activity;Obesity       PT Treatment Interventions DME instruction;Gait training;Stair training;Functional mobility training;Therapeutic activities;Therapeutic exercise;Balance training;Patient/family education    PT Goals (Current goals can be found in the Care Plan section)  Acute Rehab PT Goals Patient Stated Goal: to go home PT Goal Formulation: With patient Time For Goal Achievement: 03/10/21 Potential to Achieve Goals: Good    Frequency Min 3X/week   Barriers to discharge        Co-evaluation               AM-PAC PT "6 Clicks" Mobility  Outcome Measure Help needed turning from your back to your side while in a flat bed without using bedrails?: None Help needed moving from lying on your back to sitting on the side of a flat bed without using bedrails?: None Help needed moving to and from a bed to a chair (including a wheelchair)?: None Help needed standing up from a chair using your arms (e.g., wheelchair or bedside chair)?: A Little Help needed to walk in hospital room?: A Little Help needed climbing 3-5 steps with a railing? : A Little 6 Click Score: 21    End of Session Equipment Utilized During Treatment: Gait belt;Oxygen Activity Tolerance: Patient limited by fatigue Patient left: in chair;with call bell/phone within reach;with chair alarm set;with family/visitor present Nurse Communication: Mobility status PT Visit Diagnosis: Unsteadiness on feet (R26.81);Muscle weakness (generalized) (M62.81)    Time: 6720-9470 PT Time Calculation (min) (ACUTE ONLY): 12 min   Charges:   PT Evaluation $PT Eval Low Complexity: 1 Low          Richetta Cubillos M,PT Acute Rehab Services [phone] (pager)  Bevelyn Buckles 02/24/2021, 1:58 PM

## 2021-02-24 NOTE — Therapy (Signed)
Pt was on 10L and desaturated to the mid to high 80's and was increased to 15L by the nurse with slight improvement. Pt is a mouth breather and was placed on a 50% venturi mask at 10L with no improvement. Pt was subsequently placed on 15L NRB and sates are current;y above 92%. Pt refused to wear Bipap/cpap at night.

## 2021-02-25 ENCOUNTER — Inpatient Hospital Stay (HOSPITAL_COMMUNITY): Payer: BC Managed Care – PPO

## 2021-02-25 DIAGNOSIS — J9601 Acute respiratory failure with hypoxia: Secondary | ICD-10-CM | POA: Diagnosis not present

## 2021-02-25 DIAGNOSIS — I82453 Acute embolism and thrombosis of peroneal vein, bilateral: Secondary | ICD-10-CM | POA: Diagnosis not present

## 2021-02-25 DIAGNOSIS — J189 Pneumonia, unspecified organism: Secondary | ICD-10-CM | POA: Diagnosis not present

## 2021-02-25 LAB — BASIC METABOLIC PANEL
Anion gap: 9 (ref 5–15)
BUN: 14 mg/dL (ref 6–20)
CO2: 33 mmol/L — ABNORMAL HIGH (ref 22–32)
Calcium: 9.8 mg/dL (ref 8.9–10.3)
Chloride: 98 mmol/L (ref 98–111)
Creatinine, Ser: 0.85 mg/dL (ref 0.61–1.24)
GFR, Estimated: >60 mL/min (ref >60)
Glucose, Bld: 102 mg/dL — ABNORMAL HIGH (ref 70–99)
Potassium: 4.5 mmol/L (ref 3.5–5.1)
Sodium: 140 mmol/L (ref 135–145)

## 2021-02-25 LAB — GLUCOSE, CAPILLARY
Glucose-Capillary: 105 mg/dL — ABNORMAL HIGH (ref 70–99)
Glucose-Capillary: 117 mg/dL — ABNORMAL HIGH (ref 70–99)
Glucose-Capillary: 147 mg/dL — ABNORMAL HIGH (ref 70–99)
Glucose-Capillary: 95 mg/dL (ref 70–99)
Glucose-Capillary: 149 mg/dL — ABNORMAL HIGH (ref 70–99)

## 2021-02-25 LAB — CBC
HCT: 46.6 % (ref 39.0–52.0)
Hemoglobin: 14.8 g/dL (ref 13.0–17.0)
MCH: 30.6 pg (ref 26.0–34.0)
MCHC: 31.8 g/dL (ref 30.0–36.0)
MCV: 96.3 fL (ref 80.0–100.0)
Platelets: 186 10*3/uL (ref 150–400)
RBC: 4.84 MIL/uL (ref 4.22–5.81)
RDW: 14.7 % (ref 11.5–15.5)
WBC: 9.5 10*3/uL (ref 4.0–10.5)
nRBC: 0 % (ref 0.0–0.2)

## 2021-02-25 LAB — PHOSPHORUS: Phosphorus: 2.4 mg/dL — ABNORMAL LOW (ref 2.5–4.6)

## 2021-02-25 LAB — MAGNESIUM: Magnesium: 2 mg/dL (ref 1.7–2.4)

## 2021-02-25 MED ORDER — K PHOS MONO-SOD PHOS DI & MONO 155-852-130 MG PO TABS
250.0000 mg | ORAL_TABLET | Freq: Three times a day (TID) | ORAL | Status: AC
  Administered 2021-02-25 (×3): 250 mg via ORAL
  Filled 2021-02-25 (×3): qty 1

## 2021-02-25 MED ORDER — METOPROLOL SUCCINATE ER 25 MG PO TB24
50.0000 mg | ORAL_TABLET | Freq: Every day | ORAL | Status: DC
  Administered 2021-02-25 – 2021-02-28 (×4): 50 mg via ORAL
  Filled 2021-02-25 (×2): qty 2
  Filled 2021-02-25: qty 1

## 2021-02-25 NOTE — Progress Notes (Signed)
Patient placed on auto titrate CPAP at this time with 6L bled in. Patient tolerating well.

## 2021-02-25 NOTE — Progress Notes (Signed)
Physical Therapy Treatment Patient Details Name: Danny Norris MRN: [id number] DOB: [birth year] Today's Date: 02/25/2021    History of Present Illness This is a 60 year old male presented to the ED on 5/10 in acute respiratory distress, room air sats 60s. Oriented on arrival. Reported about 2 wk h/o generalized weakness, HAs, myalgia, w/ body aches and chills. He is NOT vaccinated against COVID. Intubated 5/20-5/24.  Work up for PNA and sepsis.    PT Comments    Pt admitted with above diagnosis. Pt was able to ambulate with RW with good overall stability. Pt without LOB but felt a little off balance per pt.  Pt may need a RW for home. Will continue to follow acutely.  Pt currently with functional limitations due to balance and endurance deficits. Pt will benefit from skilled PT to increase their independence and safety with mobility to allow discharge to the venue listed below.     Follow Up Recommendations  No PT follow up     Equipment Recommendations  Rolling walker with 5" wheels    Recommendations for Other Services       Precautions / Restrictions Precautions Precautions: Fall Restrictions Weight Bearing Restrictions: No    Mobility  Bed Mobility Overal bed mobility: Independent                  Transfers Overall transfer level: Needs assistance Equipment used: None Transfers: Sit to/from Stand;Stand Pivot Transfers Sit to Stand: Min guard;Supervision Stand pivot transfers: Min guard       General transfer comment: Steadying assist for pivot only. Pt fairly steady on his feet. Pivot to 3N1 and had BM.  Cleaned with total assist.  Ambulation/Gait Ambulation/Gait assistance: Min guard Gait Distance (Feet): 110 Feet Assistive device: Rolling walker (2 wheeled) Gait Pattern/deviations: Step-through pattern;Decreased stride length;Trunk flexed;Drifts right/left   Gait velocity interpretation: <1.31 ft/sec, indicative of household ambulator General Gait  Details: Pt was able to ambulate to hallway with RW wtih good safety overall. Pt did report his "legs feeling unsteady" but no LOB noted.  Pt also reported fatigue and limited his distance.  HR 109-128 bpm, Pt was on 6LO2 at 98%.   Stairs             Wheelchair Mobility    Modified Rankin (Stroke Patients Only)       Balance Overall balance assessment: Needs assistance Sitting-balance support: No upper extremity supported;Feet supported Sitting balance-Leahy Scale: Fair     Standing balance support: No upper extremity supported;During functional activity Standing balance-Leahy Scale: Fair Standing balance comment: can stand statically without UE support                            Cognition Arousal/Alertness: Awake/alert Behavior During Therapy: WFL for tasks assessed/performed Overall Cognitive Status: Within Functional Limits for tasks assessed                                        Exercises General Exercises - Lower Extremity Long Arc Quad: AROM;Both;10 reps;Seated    General Comments        Pertinent Vitals/Pain Pain Assessment: No/denies pain    Home Living                      Prior Function  PT Goals (current goals can now be found in the care plan section) Acute Rehab PT Goals Patient Stated Goal: to go home Progress towards PT goals: Progressing toward goals    Frequency    Min 3X/week      PT Plan Current plan remains appropriate;Equipment recommendations need to be updated    Co-evaluation              AM-PAC PT "6 Clicks" Mobility   Outcome Measure  Help needed turning from your back to your side while in a flat bed without using bedrails?: None Help needed moving from lying on your back to sitting on the side of a flat bed without using bedrails?: None Help needed moving to and from a bed to a chair (including a wheelchair)?: None Help needed standing up from a chair using your  arms (e.g., wheelchair or bedside chair)?: A Little Help needed to walk in hospital room?: A Little Help needed climbing 3-5 steps with a railing? : A Little 6 Click Score: 21    End of Session Equipment Utilized During Treatment: Gait belt;Oxygen Activity Tolerance: Patient limited by fatigue Patient left: in chair;with call bell/phone within reach;with chair alarm set Nurse Communication: Mobility status PT Visit Diagnosis: Unsteadiness on feet (R26.81);Muscle weakness (generalized) (M62.81)     Time: 6967-8938 PT Time Calculation (min) (ACUTE ONLY): 30 min  Charges:  $Gait Training: 8-22 mins $Self Care/Home Management: 8-22                     [facility] M,PT Acute Rehab Services [phone] (pager)   Bevelyn Buckles 02/25/2021, 1:13 PM

## 2021-02-25 NOTE — Plan of Care (Signed)
  Problem: Fluid Volume: Goal: Hemodynamic stability will improve Outcome: Progressing   

## 2021-02-25 NOTE — Progress Notes (Signed)
NAME:  Danny Norris, MRN:  [id number], DOB:  July 22, 1961, LOS: 6 ADMISSION DATE:  02/19/2021, CONSULTATION DATE:  5/20 REFERRING MD:  Stevie Kern, CHIEF COMPLAINT:  5/20   History of Present Illness:  This is a 60 year old male w/ hx as per below. Presented to the ED on 5/10 in acute respiratory distress, room air sats 60s. Oriented on arrival. Reported about 2 wk h/o generalized weakness, HAs, myalgia, w/ body aches and chills. He is NOT vaccinated against COVID. Over the week prior to presentation got progressively short of breath. While in ER went from 60% room air sats on arrival, initially stabilized on high flow oxygen then acutely became diaphoretic, stopped breathing and was urgently intubated. Pulm/critical care asked to admit  He remains in the 83M ICU in critical condition.   Pertinent  Medical History  MVP and MVR(w/ nml CAs), HTN, hyperlipidemia. 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 Hypoxic respiratory failure on arrival. Following what sounded like ~ 2 week viral prodrome. PCXR R>L airspace disease and RLL ATX. Intubated after failing high flow and progressing to resp arrest. Cultures sent. Azith, rocephin & vanc started. COVID & influenza PCR neg  . Lower extremity Doppler ultrasound 5/21 >> Acute DVT in the right peroneal, left peroneal, left posterior tibial veins . Echocardiogram 5/21 >> EF 60-65% mod LVH, MV prolapse, RVSF and size normal . Blood cx 5/20 >. Negative . resp viral panel 5/23 >> negative  Interim History / Subjective:   I/O +1.8 L Wore biPAP for part of the night, then back to 0.55 mask, currently on 8 L/min  Objective   Blood pressure 130/87, pulse 79, temperature 98.7 F (37.1 C), temperature source Oral, resp. rate 18, height 6' 0.01" (1.829 m), weight 122.1 kg, SpO2 98 %.    Vent Mode: PCV;BIPAP FiO2 (%):  [55 %-70 %] 55  % Set Rate:  [15 bmp] 15 bmp PEEP:  [5 cmH20] 5 cmH20 Pressure Support:  [10 cmH20] 10 cmH20   Intake/Output Summary (Last 24 hours) at 02/25/2021 0732 Last data filed at 02/25/2021 0300 Gross per 24 hour  Intake 979.24 ml  Output 3200 ml  Net -2220.76 ml   Filed Weights   02/21/21 0500 02/22/21 0410 02/24/21 0331  Weight: 121.4 kg 122.1 kg 122.1 kg    Examination: General: Sitting up in bed, no distress HEENT: Oropharynx clear, nasal cannula O2 in place Neuro: Awake, alert, interacting appropriate, following commands CV: Regular, no murmur PULM: Significantly decreased air movement right base, no crackles or wheezes GI: Nondistended, positive bowel sounds Extremities: Warm and dry, no significant edema Skin: No rash    Labs/imaging that I havepersonally reviewed  (right click and "Reselect all SmartList Selections" daily)   Labs reviewed 5/26  Resolved Hospital Problem list     Assessment & Plan:   Acute Hypoxic respiratory failure (POA) secondary to CAP (NOS to date), possible contribution of VTE and OSA.  He has an elevated right hemidiaphragm, unknown chronicity.  PCXR w/ right > left airspace disease and elevated right HD. This is following what sounded like viral prodrome but at this point either bacterial or viral on ddx.  Plan -Push pulmonary hygiene -Continue full course ceftriaxone and azithromycin, plan for 7 days -Minimize sedating medications -Follow chest x-ray. -Unclear when he developed elevated right hemidiaphragm.  He likely needs further evaluation, possibly a sniff  test, possibly a CT chest to look for diaphragmatic hernia or eventration -Believe he would benefit from nocturnal CPAP/BiPAP although he has difficulty wearing it   Circulatory shock- Improved Hx HTN Developed post-intubation. Multifactorial:septic shock, hypovolemia and medication. Remains off pressors Plan -Continue HCTZ -Restart home metoprolol  Acute bilateral lower extremity  DVT, at risk PE Heparin drip stopped yesterday, switched to apixaban Plan -Continue apixaban -appreciate pharmacy assistance   Hyperglycemia Plan -Continue SSI  H/o hypertension, MVP w/ MVR, mild Pulm hypertension and hyperlipidemia Plan -Continue lipitor -HCTZ and metoprolol as above  Electrolyte abnormalities, Hypernatremia Phos 2.3>2.7>2.8, MG 2.0, NA 536>144>315 -Continue to follow electrolytes, BMP, urine output  Best practice (right click and "Reselect all SmartList Selections" daily)  Diet:  Oral Pain/Anxiety/Delirium protocol (if indicated): No VAP protocol (if indicated): Not indicated DVT prophylaxis: Systemic AC GI prophylaxis: PPI Glucose control:  SSI Yes Central venous access:  N/A Arterial line:  N/A Foley:  N/A Mobility:  OOB  PT consulted: Yes Last date of multidisciplinary goals of care discussion [pending] Code Status:  full code Disposition: ICU Family: Discussed status and plans with patient's sister in law at bedside 5/23    Critical care time:  NA   Levy Pupa, MD, PhD 02/25/2021, 9:00 AM [place] [facility] [phone] or if no answer before 7:00PM call [phone] For any issues after 7:00PM please call eLink [phone]

## 2021-02-26 ENCOUNTER — Inpatient Hospital Stay (HOSPITAL_COMMUNITY): Payer: BC Managed Care – PPO

## 2021-02-26 LAB — BASIC METABOLIC PANEL
Anion gap: 5 (ref 5–15)
BUN: 14 mg/dL (ref 6–20)
CO2: 34 mmol/L — ABNORMAL HIGH (ref 22–32)
Calcium: 10 mg/dL (ref 8.9–10.3)
Chloride: 102 mmol/L (ref 98–111)
Creatinine, Ser: 0.72 mg/dL (ref 0.61–1.24)
GFR, Estimated: >60 mL/min (ref >60)
Glucose, Bld: 93 mg/dL (ref 70–99)
Potassium: 4.1 mmol/L (ref 3.5–5.1)
Sodium: 141 mmol/L (ref 135–145)

## 2021-02-26 LAB — CBC
HCT: 49.4 % (ref 39.0–52.0)
Hemoglobin: 15.7 g/dL (ref 13.0–17.0)
MCH: 31 pg (ref 26.0–34.0)
MCHC: 31.8 g/dL (ref 30.0–36.0)
MCV: 97.6 fL (ref 80.0–100.0)
Platelets: 176 10*3/uL (ref 150–400)
RBC: 5.06 MIL/uL (ref 4.22–5.81)
RDW: 14.8 % (ref 11.5–15.5)
WBC: 8.4 10*3/uL (ref 4.0–10.5)
nRBC: 0 % (ref 0.0–0.2)

## 2021-02-26 LAB — GLUCOSE, CAPILLARY
Glucose-Capillary: 168 mg/dL — ABNORMAL HIGH (ref 70–99)
Glucose-Capillary: 96 mg/dL (ref 70–99)
Glucose-Capillary: 127 mg/dL — ABNORMAL HIGH (ref 70–99)
Glucose-Capillary: 159 mg/dL — ABNORMAL HIGH (ref 70–99)

## 2021-02-26 LAB — MAGNESIUM: Magnesium: 2 mg/dL (ref 1.7–2.4)

## 2021-02-26 MED ORDER — FUROSEMIDE 10 MG/ML IJ SOLN
40.0000 mg | Freq: Once | INTRAMUSCULAR | Status: AC
  Administered 2021-02-26: 40 mg via INTRAVENOUS
  Filled 2021-02-26: qty 4

## 2021-02-26 NOTE — Progress Notes (Signed)
Occupational Therapy Treatment Patient Details Name: Danny Norris MRN: [id number] DOB: 01-31-1961 Today's Date: 02/26/2021    History of present illness This is a 60 year old male presented to the ED on 5/10 in acute respiratory distress, room air sats 60s. Oriented on arrival. Reported about 2 wk h/o generalized weakness, HAs, myalgia, w/ body aches and chills. He is NOT vaccinated against COVID. Intubated 5/20-5/24.  Work up for PNA and sepsis.   OT comments  Pt progressing well with OT goals. This session pt was very motivated to work on mobility, ambulating into the bathroom, completing standing grooming and transfers with min guard for safety. Pt ambulated in the room, completing 50+ft in the room, making small laps around. Pt reported he feels better when sitting and standing, he feels like it helps his lungs open up more. Acute OT will continue following up with pt to assist with progressing pt's goals to independence.     Follow Up Recommendations  No OT follow up    Equipment Recommendations  None recommended by OT    Recommendations for Other Services      Precautions / Restrictions Precautions Precautions: Fall       Mobility Bed Mobility               General bed mobility comments: Pt recieved in recliner on entry    Transfers Overall transfer level: Needs assistance Equipment used: Rolling walker (2 wheeled) Transfers: Sit to/from Stand Sit to Stand: Supervision         General transfer comment: Pt requires supervision for safety, pt fairly stable with standing    Balance Overall balance assessment: Needs assistance Sitting-balance support: No upper extremity supported;Feet supported Sitting balance-Leahy Scale: Good     Standing balance support: Bilateral upper extremity supported;No upper extremity supported;During functional activity Standing balance-Leahy Scale: Fair Standing balance comment: can stand statically without UE support                            ADL either performed or assessed with clinical judgement   ADL Overall ADL's : Needs assistance/impaired     Grooming: Wash/dry hands;Wash/dry face;Supervision/safety;Standing Grooming Details (indicate cue type and reason): completed at sink                 Toilet Transfer: Min guard;Comfort height toilet;Ambulation Toilet Transfer Details (indicate cue type and reason): Min guard for safety'         Functional mobility during ADLs: Min guard;Rolling walker General ADL Comments: Pt mobility and activity tolerance increased this session, pt only requiring min guard for safety with mobility     Vision       Perception     Praxis      Cognition Arousal/Alertness: Awake/alert Behavior During Therapy: WFL for tasks assessed/performed Overall Cognitive Status: Within Functional Limits for tasks assessed                                          Exercises     Shoulder Instructions       General Comments VSS on 4L    Pertinent Vitals/ Pain       Pain Assessment: No/denies pain  Home Living  Prior Functioning/Environment              Frequency  Min 2X/week        Progress Toward Goals  OT Goals(current goals can now be found in the care plan section)  Progress towards OT goals: Progressing toward goals  Acute Rehab OT Goals Patient Stated Goal: to go home OT Goal Formulation: With patient Time For Goal Achievement: 03/10/21 Potential to Achieve Goals: Good ADL Goals Pt Will Perform Grooming: Independently;standing Pt Will Perform Upper Body Dressing: Independently Pt Will Perform Lower Body Dressing: Independently;sit to/from stand Pt Will Transfer to Toilet: Independently Pt Will Perform Toileting - Clothing Manipulation and hygiene: Independently;sit to/from stand Pt/caregiver will Perform Home Exercise Program: Both right and left upper  extremity  Plan Discharge plan remains appropriate;Frequency remains appropriate    Co-evaluation                 AM-PAC OT "6 Clicks" Daily Activity     Outcome Measure   Help from another person eating meals?: None Help from another person taking care of personal grooming?: A Little Help from another person toileting, which includes using toliet, bedpan, or urinal?: A Little Help from another person bathing (including washing, rinsing, drying)?: A Little Help from another person to put on and taking off regular upper body clothing?: A Little Help from another person to put on and taking off regular lower body clothing?: A Little 6 Click Score: 19    End of Session Equipment Utilized During Treatment: Rolling walker;Oxygen  OT Visit Diagnosis: Unsteadiness on feet (R26.81);Muscle weakness (generalized) (M62.81)   Activity Tolerance Patient tolerated treatment well   Patient Left in chair;with call bell/phone within reach   Nurse Communication Mobility status        Time: 6568-1275 OT Time Calculation (min): 17 min  Charges: OT General Charges $OT Visit: 1 Visit OT Treatments $Self Care/Home Management : 8-22 mins  Iyania Denne H., OTR/L Acute Rehabilitation  Danny Norris Danny Norris 02/26/2021, 5:04 PM

## 2021-02-26 NOTE — TOC Benefit Eligibility Note (Signed)
Transition of Care [facility]) Benefit Eligibility Note    Patient Details  Name: Danny Norris MRN: [id number] Date of Birth: 08/21/1961   Medication/Dose: [name]  5 MG BID  Covered?: Yes  Tier: 2 Drug (PREFERRED)  Prescription Coverage Preferred [facility]acy: [facility] , [facility]  and  [facility]  Spoke with Person/Company/Phone Number:: RYAN   @ [employer] #  [phone]  Co-Pay: 100 % OF THE TOTAL COST     Deductible:  (NO DEDUCTIBLE WITH PLAN   and  OUT - OF -POCKET :[name] Phone Number: 02/26/2021, 10:19 AM

## 2021-02-26 NOTE — Progress Notes (Signed)
Patient placed on CPAP at this time. Patient tolerating well.  

## 2021-02-26 NOTE — Plan of Care (Signed)
  Problem: Clinical Measurements: Goal: Signs and symptoms of infection will decrease Outcome: Progressing   

## 2021-02-26 NOTE — Care Management (Signed)
Entered benefit check for ELIQUIS tablet 5 mg PO BID y x 3 days thanks   Will provide 30 day free card and co pay card.

## 2021-02-26 NOTE — Progress Notes (Signed)
NAME:  Danny Norris, MRN:  [id number], DOB:  Jul 14, 1961, LOS: 7 ADMISSION DATE:  02/19/2021, CONSULTATION DATE:  5/20 REFERRING MD:  Stevie Kern, CHIEF COMPLAINT:  5/20   History of Present Illness:  This is a 60 year old male w/ hx as per below. Presented to the ED on 5/10 in acute respiratory distress, room air sats 60s. Oriented on arrival. Reported about 2 wk h/o generalized weakness, HAs, myalgia, w/ body aches and chills. He is NOT vaccinated against COVID. Over the week prior to presentation got progressively short of breath. While in ER went from 60% room air sats on arrival, initially stabilized on high flow oxygen then acutely became diaphoretic, stopped breathing and was urgently intubated. Pulm/critical care asked to admit  He remains in the 68M ICU in critical condition.   Pertinent  Medical History  MVP and MVR(w/ nml CAs), HTN, hyperlipidemia. 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 Hypoxic respiratory failure on arrival. Following what sounded like ~ 2 week viral prodrome. PCXR R>L airspace disease and RLL ATX. Intubated after failing high flow and progressing to resp arrest. Cultures sent. Azith, rocephin & vanc started. COVID & influenza PCR neg  . Lower extremity Doppler ultrasound 5/21 >> Acute DVT in the right peroneal, left peroneal, left posterior tibial veins . Echocardiogram 5/21 >> EF 60-65% mod LVH, MV prolapse, RVSF and size normal . Blood cx 5/20 >. Negative . resp viral panel 5/23 >> negative  Interim History / Subjective:   Weaning O2. Put on 4L [place] while I was in room. Shallow breaths.  Objective   Blood pressure (!) 141/83, pulse 78, temperature 98.4 F (36.9 C), temperature source Oral, resp. rate (!) 28, height 6' 0.01" (1.829 m), weight 112.2 kg, SpO2 97 %.        Intake/Output Summary (Last 24 hours) at 02/26/2021 1518 Last  data filed at 02/26/2021 1324 Gross per 24 hour  Intake 700 ml  Output 1400 ml  Net -700 ml   Filed Weights   02/22/21 0410 02/24/21 0331 02/26/21 1000  Weight: 122.1 kg 122.1 kg 112.2 kg    Examination: General: Sitting up in bed, no distress HEENT: Oropharynx clear, nasal cannula O2 in place Neuro: Awake, alert, interacting appropriate, following commands CV: Regular, no murmur PULM: Significantly decreased air movement right base, no crackles or wheezes GI: Nondistended, positive bowel sounds Extremities: Warm and dry, pitting edema Skin: No rash    Labs/imaging that I havepersonally reviewed  (right click and "Reselect all SmartList Selections" daily)   Labs reviewed 5/26  Resolved Hospital Problem list     Assessment & Plan:   Acute Hypoxic respiratory failure (POA) secondary to CAP (NOS to date), possible contribution of VTE/PE and OSA.  He has an elevated right hemidiaphragm, unknown chronicity (last prior CXR 04/2011).  PCXR w/ right > left airspace disease and elevated right HD. This is following what sounded like viral prodrome but at this point either bacterial or viral on ddx. Volume overloaded on exam. Shallow breaths, abdominal girth - likely element of atelectasis. Bicarb >30, suspect element of OHS. Plan -Push pulmonary hygiene, must get OOB to chair, ambulate often -Continue full course ceftriaxone and azithromycin, plan for 7 days -Minimize sedating medications -CXR 2V - if no significant new abnormality no role for additional imaging -Sniff test -Diurese as kidney function and BP allows -Wean O2  for sats >88% -known OSA, needs to wear nocturnal CPAP/BiPAP although he has difficulty wearing it  We will sign off.  Best practice (right click and "Reselect all SmartList Selections" daily)  Per primary    Critical care time:  NA   Karren Burly 02/26/2021, 3:18 PM [facility] See amion for contact details

## 2021-02-26 NOTE — Progress Notes (Signed)
PROGRESS NOTE    Danny Norris  ZOX:[id number] DOB: 1961-02-11 DOA: 02/19/2021 PCP: Knox Royalty, MD   Chief Complaint  Patient presents with  . Shortness of Breath  Brief Narrative: 60 year old male with multiple medical problem including MVP and MVR, HTN, hyperlipidemia, mild PAH, mild AVI, allergic to contrast media was brought to the ED 5/20 with acute respiratory distress saturating in 60% room air, oriented on arrival.  Reported about 2-week history of generalized weakness, headaches myalgias with body aches and chills, not vaccinated against COVID.  Over the week prior to presentation got progressively short of breath.  In the ED initially stabilized on high flow nasal cannula but became diaphoretic start breathing and urgently intubated and admitted to ICU. He has managed for acute hypoxic respiratory failure with 2 years of viral prodrome managed with antibiotics lower Doppler ultrasound acute DVT in the right peroneal left peroneal and left posterior tibial vein Echo with EF 60 to 65% RV SF and size normal, blood culture from 5/20 no growth, respiratory virus panel 5/23 negative. Patient improved with antibiotics pulmonary hygiene and extubated on 5/24 and has been managed with high flow nasal cannula, CPAP.  Patient was found to have a right hemidiaphragm elevation Transferred to [facility] 5/27.   Subjective: Seen this morning.  He is on 6 L nasal cannula resting on the edge of the bed report his breathing is better but not back to baseline yet denies cough fever chills nausea vomiting. He has leg edema.   Assessment & Plan:  Acute hypoxic respiratory failure secondary to pneumonia possible contributed of VTE,OSA Elevated right hemidiaphragm of unknown chronicity Community-acquired pneumonia Initially intubated extubated 5/24 to high flow nasal cannula Respiratory status improving this morning on 60 nasal cannula and mobilizing better.  He has completed ceftriaxone/azithromycin x7  days.  Repeat chest x-ray in a.m., has right hemidiaphragm elevation pulmonary recommended likely needs further evaluation possibly sniff test possible CT chest to look for diaphragmatic hernia or eventration.  Continue to wean oxygen continue PT OT  Circulatory shock: Post intubation multifactorial with septic shock, hypovolemia and medication it has resolved antihypertensive has been  Hypertension: BP controlled on HCTZ and metoprolol.  Acute bilateral lower extremity DVT at risk PE: Transition to Eliquis pharmacy dosing  Hyperglycemia, A1c 6.3, continue to monitor Lab Results  Component Value Date   HGBA1C 6.3 (H) 02/19/2021   History of MVP with/MVR, mild pulmonary hypertension-echocardiogram stable.  Continue HCTZ metoprolol.  Hyperlipidemia: Continues a statin  Hypernatremia it has resolved  Leg edema: Continue on HCTZ overall weight is stable total net balance 756 mL.  If he still has persistent respiratory issue unable to wean oxygen can try  Lasix Filed Weights   02/22/21 0410 02/24/21 0331 02/26/21 1000  Weight: 122.1 kg 122.1 kg 112.2 kg  Net IO Since Admission: 756.39 mL [02/26/21 1130]  Intake/Output Summary (Last 24 hours) at 02/26/2021 1130 Last data filed at 02/26/2021 1100 Gross per 24 hour  Intake 519.38 ml  Output 1400 ml  Net -880.62 ml   Morbid obesity with BMI 33 will benefit weight loss.  Diet Order            Diet Heart Room service appropriate? Yes; Fluid consistency: Thin  Diet effective now                 Nutrition Problem: Inadequate oral intake Etiology: inability to eat Signs/Symptoms: NPO status Interventions: Ensure Enlive (each supplement provides 350kcal and 20 grams of protein) Patient's Body  mass index is 33.54 kg/m.  DVT prophylaxis: Eliquis Code Status:   Code Status: Full Code  Family Communication: plan of care discussed with patient at bedside.  Status is: Inpatient  Remains inpatient appropriate because:Inpatient level of  care appropriate due to severity of illness   Dispo: The patient is from: Home              Anticipated d/c is to: Home 2-3 days              Patient currently is not medically stable to d/c.   Difficult to place patient No       Unresulted Labs (From admission, onward)         None      Medications reviewed:  Scheduled Meds: . apixaban  10 mg Oral BID   Followed by  . [START ON 03/01/2021] apixaban  5 mg Per Tube BID  . aspirin  81 mg Oral Daily  . atorvastatin  20 mg Oral q1800  . Chlorhexidine Gluconate Cloth  6 each Topical Daily  . docusate sodium  100 mg Oral BID  . feeding supplement  237 mL Oral BID BM  . hydrochlorothiazide  25 mg Oral Daily  . insulin aspart  0-5 Units Subcutaneous QHS  . insulin aspart  0-9 Units Subcutaneous TID WC  . mouth rinse  15 mL Mouth Rinse BID  . metoprolol succinate  50 mg Oral Daily  . polyethylene glycol  17 g Oral Daily   Continuous Infusions: . sodium chloride Stopped (02/25/21 0256)    Consultants: PCCM  Procedures:see note  Antimicrobials: Anti-infectives (From admission, onward)   Start     Dose/Rate Route Frequency Ordered Stop   02/24/21 1000  azithromycin (ZITHROMAX) tablet 500 mg        500 mg Oral Daily 02/23/21 1509 02/25/21 0909   02/20/21 1200  azithromycin (ZITHROMAX) 500 mg in sodium chloride 0.9 % 250 mL IVPB  Status:  Discontinued        500 mg 250 mL/hr over 60 Minutes Intravenous Every 24 hours 02/19/21 2140 02/23/21 1509   02/20/21 1100  cefTRIAXone (ROCEPHIN) 2 g in sodium chloride 0.9 % 100 mL IVPB        2 g 200 mL/hr over 30 Minutes Intravenous Every 24 hours 02/19/21 2140 02/25/21 1055   02/20/21 1030  vancomycin (VANCOREADY) IVPB 1000 mg/200 mL  Status:  Discontinued        1,000 mg 200 mL/hr over 60 Minutes Intravenous Every 12 hours 02/19/21 2159 02/22/21 0943   02/19/21 2245  vancomycin (VANCOCIN) 2,250 mg in sodium chloride 0.9 % 500 mL IVPB        2,250 mg 250 mL/hr over 120 Minutes  Intravenous  Once 02/19/21 2159 02/20/21 0104   02/19/21 2200  cefTRIAXone (ROCEPHIN) 1 g in sodium chloride 0.9 % 100 mL IVPB        1 g 200 mL/hr over 30 Minutes Intravenous  Once 02/19/21 2141 02/19/21 2300   02/19/21 0915  cefTRIAXone (ROCEPHIN) 1 g in sodium chloride 0.9 % 100 mL IVPB        1 g 200 mL/hr over 30 Minutes Intravenous  Once 02/19/21 0912 02/19/21 1151   02/19/21 0915  azithromycin (ZITHROMAX) 500 mg in sodium chloride 0.9 % 250 mL IVPB        500 mg 250 mL/hr over 60 Minutes Intravenous  Once 02/19/21 0912 02/19/21 1307     Culture/Microbiology    Component Value Date/Time  SDES BLOOD LEFT ANTECUBITAL 02/19/2021 1523   SPECREQUEST  02/19/2021 1523    BOTTLES DRAWN AEROBIC ONLY Blood Culture adequate volume   CULT  02/19/2021 1523    NO GROWTH 5 DAYS Performed at [facility], [address] [place], [place] [address]    REPTSTATUS 02/24/2021 FINAL 02/19/2021 1523    Other culture-see note  Objective: Vitals: Today's Vitals   02/26/21 0027 02/26/21 0618 02/26/21 0932 02/26/21 1000  BP: 138/78 (!) 132/91 118/82   Pulse: 61 76 81   Resp: (!) 23 (!) 24 20   Temp: 98.6 F (37 C) 98.6 F (37 C) 99 F (37.2 C)   TempSrc: Axillary Oral Oral   SpO2: 91% 99% 96%   Weight:    112.2 kg  Height:      PainSc:        Intake/Output Summary (Last 24 hours) at 02/26/2021 1123 Last data filed at 02/26/2021 1100 Gross per 24 hour  Intake 519.38 ml  Output 1400 ml  Net -880.62 ml   Filed Weights   02/22/21 0410 02/24/21 0331 02/26/21 1000  Weight: 122.1 kg 122.1 kg 112.2 kg   Weight change:   Intake/Output from previous day: 05/26 0701 - 05/27 0700 In: 279.4 [P.O.:120; I.V.:59.4; IV Piggyback:100] Out: 1300 [Urine:1300] Intake/Output this shift: Total I/O In: 360 [P.O.:360] Out: 400 [Urine:400] Filed Weights   02/22/21 0410 02/24/21 0331 02/26/21 1000  Weight: 122.1 kg 122.1 kg 112.2 kg    Examination: General exam: AAO X3, not in distress,  obese gentleman, older than stated age, weak appearing. HEENT:Oral mucosa moist, Ear/Nose WNL grossly,dentition normal. Respiratory system: Air entry and breath sound diminished on the right mid to base, clear on the left, no use of accessory muscle, non tender. Cardiovascular system: S1 & S2 +,No JVD. Gastrointestinal system: Abdomen soft, NT,ND, BS+. Nervous System:Alert, awake, moving extremities Extremities:  B/l pitting ankle edema, distal peripheral pulses palpable.  Skin: No rashes,no icterus. MSK: Normal muscle bulk,tone, power  Data Reviewed: I have personally reviewed following labs and imaging studies CBC: Recent Labs  Lab 02/22/21 0757 02/23/21 0234 02/24/21 0248 02/25/21 0142 02/26/21 0154  WBC 7.1 6.5 8.9 9.5 8.4  HGB 14.3 13.7 15.7 14.8 15.7  HCT 45.9 44.6 51.4 46.6 49.4  MCV 98.9 99.8 99.0 96.3 97.6  PLT 166 155 191 186 176   Basic Metabolic Panel: Recent Labs  Lab 02/21/21 0603 02/21/21 1642 02/22/21 0757 02/23/21 0234 02/24/21 0248 02/25/21 0142 02/26/21 0154  NA 146*  --  144 146* 144 140 141  K 3.3*  --  3.9 4.1 4.3 4.5 4.1  CL 110  --  108 111 103 98 102  CO2 31  --  31 30 32 33* 34*  GLUCOSE 102*  --  95 93 137* 102* 93  BUN 17  --  CREATININE 1.04  --  1.00 0.88 0.74 0.85 0.72  CALCIUM 9.3  --  9.4 9.5 10.0 9.8 10.0  MG 1.9 1.9 2.0 2.0  --  2.0 2.0  PHOS 2.7 2.3* 2.7 2.8  --  2.4*  --    GFR: Estimated Creatinine Clearance: 128.5 mL/min (by C-G formula based on SCr of 0.72 mg/dL). Liver Function Tests: No results for input(s): AST, ALT, ALKPHOS, BILITOT, PROT, ALBUMIN in the last 168 hours. No results for input(s): LIPASE, AMYLASE in the last 168 hours. No results for input(s): AMMONIA in the last 168 hours. Coagulation Profile: No results for input(s):  INR, PROTIME in the last 168 hours. Cardiac Enzymes: No results for input(s): CKTOTAL, CKMB, CKMBINDEX, TROPONINI in the last 168 hours. BNP (last 3 results) No results  for input(s): PROBNP in the last 8760 hours. HbA1C: No results for input(s): HGBA1C in the last 72 hours. CBG: Recent Labs  Lab 02/25/21 0802 02/25/21 1128 02/25/21 1556 02/25/21 2044 02/26/21 0745  GLUCAP 117* 147* 95 149* 168*   Lipid Profile: No results for input(s): CHOL, HDL, LDLCALC, TRIG, CHOLHDL, LDLDIRECT in the last 72 hours. Thyroid Function Tests: No results for input(s): TSH, T4TOTAL, FREET4, T3FREE, THYROIDAB in the last 72 hours. Anemia Panel: No results for input(s): VITAMINB12, FOLATE, FERRITIN, TIBC, IRON, RETICCTPCT in the last 72 hours. Sepsis Labs: Recent Labs  Lab 02/19/21 1134 02/19/21 1234 02/19/21 1523  PROCALCITON  --   --  0.14  LATICACIDVEN 1.2 2.2*  --     Recent Results (from the past 240 hour(s))  Blood culture (routine x 2)     Status: None   Collection Time: 02/19/21  9:13 AM   Specimen: BLOOD LEFT WRIST  Result Value Ref Range Status   Specimen Description BLOOD LEFT WRIST  Final   Special Requests   Final    BOTTLES DRAWN AEROBIC AND ANAEROBIC Blood Culture adequate volume   Culture   Final    NO GROWTH 5 DAYS Performed at [facility], [address] [place], [place] [address]    Report Status 02/24/2021 FINAL  Final  Resp Panel by RT-PCR (Flu A&B, Covid) Nasopharyngeal Swab     Status: None   Collection Time: 02/19/21 10:37 AM   Specimen: Nasopharyngeal Swab; Nasopharyngeal(NP) swabs in vial transport medium  Result Value Ref Range Status   SARS Coronavirus 2 by RT PCR NEGATIVE NEGATIVE Final    Comment: (NOTE) SARS-CoV-2 target nucleic acids are NOT DETECTED.  The SARS-CoV-2 RNA is generally detectable in upper respiratory specimens during the acute phase of infection. The lowest concentration of SARS-CoV-2 viral copies this assay can detect is 138 copies/mL. A negative result does not preclude SARS-Cov-2 infection and should not be used as the sole basis for treatment or other patient management decisions. A negative  result may occur with  improper specimen collection/handling, submission of specimen other than nasopharyngeal swab, presence of viral mutation(s) within the areas targeted by this assay, and inadequate number of viral copies(<138 copies/mL). A negative result must be combined with clinical observations, patient history, and epidemiological information. The expected result is Negative.  Fact Sheet for Patients:  BloggerCourse.com  Fact Sheet for Healthcare Providers:  SeriousBroker.it  This test is no t yet approved or cleared by the [place] FDA and  has been authorized for detection and/or diagnosis of SARS-CoV-2 by FDA under an Emergency Use Authorization (EUA). This EUA will remain  in effect (meaning this test can be used) for the duration of the COVID-19 declaration under Section 564(b)(1) of the Act, 21 U.S.C.section 360bbb-3(b)(1), unless the authorization is terminated  or revoked sooner.       Influenza A by PCR NEGATIVE NEGATIVE Final   Influenza B by PCR NEGATIVE NEGATIVE Final    Comment: (NOTE) The Xpert Xpress SARS-CoV-2/FLU/RSV plus assay is intended as an aid in the diagnosis of influenza from Nasopharyngeal swab specimens and should not be used as a sole basis for treatment. Nasal washings and aspirates are unacceptable for Xpert Xpress SARS-CoV-2/FLU/RSV testing.  Fact Sheet for Patients: BloggerCourse.com  Fact Sheet for Healthcare Providers: SeriousBroker.it  This  test is not yet approved or cleared by the [place] FDA and has been authorized for detection and/or diagnosis of SARS-CoV-2 by FDA under an Emergency Use Authorization (EUA). This EUA will remain in effect (meaning this test can be used) for the duration of the COVID-19 declaration under Section 564(b)(1) of the Act, 21 U.S.C. section 360bbb-3(b)(1), unless the authorization is  terminated or revoked.  Performed at [facility], [address] [place], [place] [address]   Culture, respiratory (tracheal aspirate)     Status: None   Collection Time: 02/19/21 11:51 AM   Specimen: Tracheal Aspirate; Respiratory  Result Value Ref Range Status   Specimen Description TRACHEAL ASPIRATE  Final   Special Requests Normal  Final   Gram Stain   Final    ABUNDANT WBC PRESENT,BOTH PMN AND MONONUCLEAR FEW GRAM POSITIVE COCCI RARE GRAM VARIABLE ROD    Culture   Final    RARE Normal respiratory flora-no Staph aureus or Pseudomonas seen Performed at [facility], [address] [place], [place] [address]    Report Status 02/22/2021 FINAL  Final  MRSA PCR Screening     Status: None   Collection Time: 02/19/21  3:20 PM   Specimen: Nasal Mucosa; Nasopharyngeal  Result Value Ref Range Status   MRSA by PCR NEGATIVE NEGATIVE Final    Comment:        The GeneXpert MRSA Assay (FDA approved for NASAL specimens only), is one component of a comprehensive MRSA colonization surveillance program. It is not intended to diagnose MRSA infection nor to guide or monitor treatment for MRSA infections. Performed at [facility], [address] [place], [place] [address]   Blood culture (routine x 2)     Status: None   Collection Time: 02/19/21  3:23 PM   Specimen: BLOOD  Result Value Ref Range Status   Specimen Description BLOOD LEFT ANTECUBITAL  Final   Special Requests   Final    BOTTLES DRAWN AEROBIC ONLY Blood Culture adequate volume   Culture   Final    NO GROWTH 5 DAYS Performed at [facility], [address] [place], [place] [address]    Report Status 02/24/2021 FINAL  Final  Respiratory (~20 pathogens) panel by PCR     Status: None   Collection Time: 02/22/21  6:42 PM  Result Value Ref Range Status   Adenovirus NOT DETECTED NOT DETECTED Final   Coronavirus 229E NOT DETECTED NOT DETECTED Final    Comment: (NOTE) The Coronavirus on the  Respiratory Panel, DOES NOT test for the novel  Coronavirus (2019 nCoV)    Coronavirus HKU1 NOT DETECTED NOT DETECTED Final   Coronavirus NL63 NOT DETECTED NOT DETECTED Final   Coronavirus OC43 NOT DETECTED NOT DETECTED Final   Metapneumovirus NOT DETECTED NOT DETECTED Final   Rhinovirus / Enterovirus NOT DETECTED NOT DETECTED Final   Influenza A NOT DETECTED NOT DETECTED Final   Influenza B NOT DETECTED NOT DETECTED Final   Parainfluenza Virus 1 NOT DETECTED NOT DETECTED Final   Parainfluenza Virus 2 NOT DETECTED NOT DETECTED Final   Parainfluenza Virus 3 NOT DETECTED NOT DETECTED Final   Parainfluenza Virus 4 NOT DETECTED NOT DETECTED Final   Respiratory Syncytial Virus NOT DETECTED NOT DETECTED Final   Bordetella pertussis NOT DETECTED NOT DETECTED Final   Bordetella Parapertussis NOT DETECTED NOT DETECTED Final   Chlamydophila pneumoniae NOT DETECTED NOT DETECTED Final   Mycoplasma pneumoniae NOT DETECTED NOT DETECTED Final  Comment: Performed at [facility], [address] [place], [place] [address]     Radiology Studies: DG CHEST PORT 1 VIEW  Result Date: 02/25/2021 CLINICAL DATA:  Acute respiratory failure.  Hypoxia. EXAM: PORTABLE CHEST 1 VIEW COMPARISON:  02/24/2021. FINDINGS: Stable cardiomegaly. Persistent elevation right hemidiaphragm with right base atelectasis. No pleural effusion or pneumothorax. IMPRESSION: 1. Persistent elevation right hemidiaphragm with right base atelectasis. 2.  Stable cardiomegaly. Electronically Signed   By: Maisie Fus  Register   On: 02/25/2021 07:00   DG CHEST PORT 1 VIEW  Result Date: 02/24/2021 CLINICAL DATA:  Acute respiratory failure with hypoxia. EXAM: PORTABLE CHEST 1 VIEW COMPARISON:  02/24/2021 FINDINGS: Marked elevation right hemidiaphragm unchanged with right lower lobe atelectasis. Improved aeration in the right lung apex. Cardiac enlargement. No edema. Mild left lower lobe airspace disease slightly improved. IMPRESSION: Marked  elevation right hemidiaphragm with right lower lobe atelectasis. Improved aeration in the right apex and left lower lobe likely due to hypoventilation on the prior study. Electronically Signed   By: Marlan Palau M.D.   On: 02/24/2021 14:48     LOS: 7 days   Lanae Boast, MD [facility]  02/26/2021, 11:23 AM

## 2021-02-26 NOTE — Progress Notes (Signed)
   02/26/21 1400  Assess: MEWS Score  BP (!) 141/83  Pulse Rate 78  Resp (!) 28  SpO2 97 %  O2 Device Nasal Cannula  O2 Flow Rate (L/min) 5 L/min  Assess: MEWS Score  MEWS Temp 0  MEWS Systolic 0  MEWS Pulse 0  MEWS RR 2  MEWS LOC 0  MEWS Score 2  MEWS Score Color Yellow  Assess: if the MEWS score is Yellow or Red  Were vital signs taken at a resting state? No (just came back from restroom)  Focused Assessment Change from prior assessment (see assessment flowsheet)  Early Detection of Sepsis Score *See Row Information* Low  MEWS guidelines implemented *See Row Information* Yes  Treat  Pain Scale 0-10  Pain Score 0  Escalate  MEWS: Escalate Yellow: discuss with charge nurse/RN and consider discussing with provider and RRT  Notify: Charge Nurse/RN  Name of Charge Nurse/RN Notified Teena Irani  Date Charge Nurse/RN Notified 02/26/21  Time Charge Nurse/RN Notified 1420  Notify: Provider  Provider Name/Title Dr. Jonathon Bellows  Date Provider Notified 02/26/21  Time Provider Notified 1420  Notification Type Page  Notification Reason  (MD reconsulted Pulmonologist)  Provider response No new orders (monitor patient)  Date of Provider Response 02/26/21  Time of Provider Response 1430

## 2021-02-27 ENCOUNTER — Inpatient Hospital Stay (HOSPITAL_COMMUNITY): Payer: BC Managed Care – PPO

## 2021-02-27 LAB — CBC
HCT: 46.1 % (ref 39.0–52.0)
Hemoglobin: 14.5 g/dL (ref 13.0–17.0)
MCH: 30.7 pg (ref 26.0–34.0)
MCHC: 31.5 g/dL (ref 30.0–36.0)
MCV: 97.5 fL (ref 80.0–100.0)
Platelets: 207 10*3/uL (ref 150–400)
RBC: 4.73 MIL/uL (ref 4.22–5.81)
RDW: 14.6 % (ref 11.5–15.5)
WBC: 8.6 10*3/uL (ref 4.0–10.5)
nRBC: 0 % (ref 0.0–0.2)

## 2021-02-27 LAB — BASIC METABOLIC PANEL
Anion gap: 8 (ref 5–15)
BUN: 12 mg/dL (ref 6–20)
CO2: 38 mmol/L — ABNORMAL HIGH (ref 22–32)
Calcium: 10.2 mg/dL (ref 8.9–10.3)
Chloride: 96 mmol/L — ABNORMAL LOW (ref 98–111)
Creatinine, Ser: 0.9 mg/dL (ref 0.61–1.24)
GFR, Estimated: >60 mL/min (ref >60)
Glucose, Bld: 95 mg/dL (ref 70–99)
Potassium: 3.9 mmol/L (ref 3.5–5.1)
Sodium: 142 mmol/L (ref 135–145)

## 2021-02-27 LAB — GLUCOSE, CAPILLARY
Glucose-Capillary: 101 mg/dL — ABNORMAL HIGH (ref 70–99)
Glucose-Capillary: 122 mg/dL — ABNORMAL HIGH (ref 70–99)
Glucose-Capillary: 111 mg/dL — ABNORMAL HIGH (ref 70–99)
Glucose-Capillary: 213 mg/dL — ABNORMAL HIGH (ref 70–99)

## 2021-02-27 NOTE — Progress Notes (Signed)
PROGRESS NOTE    Danny Norris  ZOX:[id number] DOB: 01-07-1961 DOA: 02/19/2021 PCP: Knox RoyaltyJones, Enrico, MD   Chief Complaint  Patient presents with  . Shortness of Breath  Brief Narrative: 60 year old male with multiple medical problem including MVP and MVR, HTN, hyperlipidemia, mild PAH, mild AVI, allergic to contrast media was brought to the ED 5/20 with acute respiratory distress saturating in 60% room air, oriented on arrival.  Reported about 2-week history of generalized weakness, headaches myalgias with body aches and chills, not vaccinated against COVID.  Over the week prior to presentation got progressively short of breath.  In the ED initially stabilized on high flow nasal cannula but became diaphoretic start breathing and urgently intubated and admitted to ICU. He has managed for acute hypoxic respiratory failure with 2 years of viral prodrome managed with antibiotics lower Doppler ultrasound acute DVT in the right peroneal left peroneal and left posterior tibial vein Echo with EF 60 to 65% RV SF and size normal, blood culture from 5/20 no growth, respiratory virus panel 5/23 negative. Patient improved with antibiotics pulmonary hygiene and extubated on 5/24 and has been managed with high flow nasal cannula, CPAP.  Patient was found to have a right hemidiaphragm elevation [facility]  02/27/2021: Patient seen alongside patient's sister.  Patient continues to improve.  Supplemental oxygen via nasal cannula is down to 3 L/min.  Awaiting physical therapy input.   Subjective: -No new complaints. -No worsening shortness of breath. -No chest pain.   Assessment & Plan:  Acute hypoxic respiratory failure secondary to pneumonia possible contributed of VTE,OSA Elevated right hemidiaphragm of unknown chronicity Community-acquired pneumonia Initially intubated extubated 5/24 to high flow nasal cannula Respiratory status improving this morning on 60 nasal cannula and mobilizing  better.  He has completed ceftriaxone/azithromycin x7 days.  Repeat chest x-ray in a.m., has right hemidiaphragm elevation pulmonary recommended likely needs further evaluation possibly sniff test possible CT chest to look for diaphragmatic hernia or eventration.  Continue to wean oxygen continue PT OT 02/27/2021: Continue current management.  Oxygen requirement is decreasing.  Circulatory shock: - Post intubation multifactorial with septic shock -Shock has resolved.  Hypertension:  Continue to optimize blood pressure control.  Acute bilateral lower extremity DVT at risk PE:  -On Eliquis 5 Mg p.o. twice daily.    Hyperglycemia, A1c 6.3, continue to monitor Lab Results  Component Value Date   HGBA1C 6.3 (H) 02/19/2021   History of MVP with/MVR, mild pulmonary hypertension-echocardiogram stable.  Continue HCTZ metoprolol.  Hyperlipidemia: Continues a statin  Hypernatremia: -Resolved  Filed Weights   02/22/21 0410 02/24/21 0331 02/26/21 1000  Weight: 122.1 kg 122.1 kg 112.2 kg  Net IO Since Admission: 246.39 mL [02/27/21 1310]  Intake/Output Summary (Last 24 hours) at 02/27/2021 1310 Last data filed at 02/27/2021 0900 Gross per 24 hour  Intake 700 ml  Output 1210 ml  Net -510 ml   Morbid obesity with BMI 33 will benefit weight loss.  Diet Order            Diet Heart Room service appropriate? Yes; Fluid consistency: Thin  Diet effective now                 Nutrition Problem: Inadequate oral intake Etiology: inability to eat Signs/Symptoms: NPO status Interventions: Ensure Enlive (each supplement provides 350kcal and 20 grams of protein) Patient's Body mass index is 33.54 kg/m.  DVT prophylaxis: Eliquis Code Status:   Code Status: Full Code  Family Communication: plan of  care discussed with patient at bedside.  Status is: Inpatient  Remains inpatient appropriate because:Inpatient level of care appropriate due to severity of illness   Dispo: The patient is from:  Home              Anticipated d/c is to: Home 2-3 days              Patient currently is not medically stable to d/c.   Difficult to place patient No       Unresulted Labs (From admission, onward)         None      Medications reviewed:  Scheduled Meds: . apixaban  10 mg Oral BID   Followed by  . [START ON 03/01/2021] apixaban  5 mg Per Tube BID  . aspirin  81 mg Oral Daily  . atorvastatin  20 mg Oral q1800  . Chlorhexidine Gluconate Cloth  6 each Topical Daily  . docusate sodium  100 mg Oral BID  . feeding supplement  237 mL Oral BID BM  . hydrochlorothiazide  25 mg Oral Daily  . insulin aspart  0-5 Units Subcutaneous QHS  . insulin aspart  0-9 Units Subcutaneous TID WC  . mouth rinse  15 mL Mouth Rinse BID  . metoprolol succinate  50 mg Oral Daily  . polyethylene glycol  17 g Oral Daily   Continuous Infusions: . sodium chloride Stopped (02/25/21 0256)    Consultants: PCCM  Procedures:see note  Antimicrobials: Anti-infectives (From admission, onward)   Start     Dose/Rate Route Frequency Ordered Stop   02/24/21 1000  azithromycin (ZITHROMAX) tablet 500 mg        500 mg Oral Daily 02/23/21 1509 02/25/21 0909   02/20/21 1200  azithromycin (ZITHROMAX) 500 mg in sodium chloride 0.9 % 250 mL IVPB  Status:  Discontinued        500 mg 250 mL/hr over 60 Minutes Intravenous Every 24 hours 02/19/21 2140 02/23/21 1509   02/20/21 1100  cefTRIAXone (ROCEPHIN) 2 g in sodium chloride 0.9 % 100 mL IVPB        2 g 200 mL/hr over 30 Minutes Intravenous Every 24 hours 02/19/21 2140 02/25/21 1055   02/20/21 1030  vancomycin (VANCOREADY) IVPB 1000 mg/200 mL  Status:  Discontinued        1,000 mg 200 mL/hr over 60 Minutes Intravenous Every 12 hours 02/19/21 2159 02/22/21 0943   02/19/21 2245  vancomycin (VANCOCIN) 2,250 mg in sodium chloride 0.9 % 500 mL IVPB        2,250 mg 250 mL/hr over 120 Minutes Intravenous  Once 02/19/21 2159 02/20/21 0104   02/19/21 2200  cefTRIAXone  (ROCEPHIN) 1 g in sodium chloride 0.9 % 100 mL IVPB        1 g 200 mL/hr over 30 Minutes Intravenous  Once 02/19/21 2141 02/19/21 2300   02/19/21 0915  cefTRIAXone (ROCEPHIN) 1 g in sodium chloride 0.9 % 100 mL IVPB        1 g 200 mL/hr over 30 Minutes Intravenous  Once 02/19/21 0912 02/19/21 1151   02/19/21 0915  azithromycin (ZITHROMAX) 500 mg in sodium chloride 0.9 % 250 mL IVPB        500 mg 250 mL/hr over 60 Minutes Intravenous  Once 02/19/21 0912 02/19/21 1307     Culture/Microbiology    Component Value Date/Time   SDES BLOOD LEFT ANTECUBITAL 02/19/2021 1523   SPECREQUEST  02/19/2021 1523    BOTTLES DRAWN AEROBIC ONLY Blood  Culture adequate volume   CULT  02/19/2021 1523    NO GROWTH 5 DAYS Performed at [facility], [address], [place], [place] [address]    REPTSTATUS 02/24/2021 FINAL 02/19/2021 1523    Other culture-see note  Objective: Vitals: Today's Vitals   02/26/21 2034 02/27/21 0420 02/27/21 0802 02/27/21 1117  BP: 136/84 (!) 153/78    Pulse: 65 70    Resp: (!) 22 (!) 22    Temp: 97.9 F (36.6 C) 97.7 F (36.5 C)    TempSrc:  Oral    SpO2: 99% 98%  96%  Weight:      Height:      PainSc:   0-No pain     Intake/Output Summary (Last 24 hours) at 02/27/2021 1310 Last data filed at 02/27/2021 0900 Gross per 24 hour  Intake 700 ml  Output 1210 ml  Net -510 ml   Filed Weights   02/22/21 0410 02/24/21 0331 02/26/21 1000  Weight: 122.1 kg 122.1 kg 112.2 kg   Weight change:   Intake/Output from previous day: 05/27 0701 - 05/28 0700 In: 700 [P.O.:700] Out: 1610 [Urine:1610] Intake/Output this shift: Total I/O In: 360 [P.O.:360] Out: -  Filed Weights   02/22/21 0410 02/24/21 0331 02/26/21 1000  Weight: 122.1 kg 122.1 kg 112.2 kg    Examination: General exam: AAO X3, not in distress, obese gentleman, older than stated age, weak appearing. HEENT:Oral mucosa moist, Ear/Nose WNL grossly,dentition normal. Respiratory system: Air entry and  breath sound diminished on the right mid to base, clear on the left, no use of accessory muscle, non tender. Cardiovascular system: S1 & S2 +,No JVD. Gastrointestinal system: Abdomen soft, NT,ND, BS+. Nervous System:Alert, awake, moving extremities Extremities:  B/l pitting ankle edema, distal peripheral pulses palpable.  Skin: No rashes,no icterus. MSK: Normal muscle bulk,tone, power  Data Reviewed: I have personally reviewed following labs and imaging studies CBC: Recent Labs  Lab 02/23/21 0234 02/24/21 0248 02/25/21 0142 02/26/21 0154 02/27/21 0224  WBC 6.5 8.9 9.5 8.4 8.6  HGB 13.7 15.7 14.8 15.7 14.5  HCT 44.6 51.4 46.6 49.4 46.1  MCV 99.8 99.0 96.3 97.6 97.5  PLT 155 191 186 176 207   Basic Metabolic Panel: Recent Labs  Lab 02/21/21 0603 02/21/21 1642 02/22/21 0757 02/23/21 0234 02/24/21 0248 02/25/21 0142 02/26/21 0154 02/27/21 0224  NA 146*  --  144 146* 144 140 141 142  K 3.3*  --  3.9 4.1 4.3 4.5 4.1 3.9  CL 110  --  108 111 103 98 102 96*  CO2 31  --  31 30 32 33* 34* 38*  GLUCOSE 102*  --  95 93 137* 102* 93 95  BUN 17  --  14 19 10 14 14 12   CREATININE 1.04  --  1.00 0.88 0.74 0.85 0.72 0.90  CALCIUM 9.3  --  9.4 9.5 10.0 9.8 10.0 10.2  MG 1.9 1.9 2.0 2.0  --  2.0 2.0  --   PHOS 2.7 2.3* 2.7 2.8  --  2.4*  --   --    GFR: Estimated Creatinine Clearance: 114.3 mL/min (by C-G formula based on SCr of 0.9 mg/dL). Liver Function Tests: No results for input(s): AST, ALT, ALKPHOS, BILITOT, PROT, ALBUMIN in the last 168 hours. No results for input(s): LIPASE, AMYLASE in the last 168 hours. No results for input(s): AMMONIA in the last 168 hours. Coagulation Profile: No results for input(s): INR, PROTIME in the last 168 hours. Cardiac Enzymes: No  results for input(s): CKTOTAL, CKMB, CKMBINDEX, TROPONINI in the last 168 hours. BNP (last 3 results) No results for input(s): PROBNP in the last 8760 hours. HbA1C: No results for input(s): HGBA1C in the last 72  hours. CBG: Recent Labs  Lab 02/26/21 1143 02/26/21 1610 02/26/21 2034 02/27/21 0735 02/27/21 1209  GLUCAP 96 127* 159* 101* 122*   Lipid Profile: No results for input(s): CHOL, HDL, LDLCALC, TRIG, CHOLHDL, LDLDIRECT in the last 72 hours. Thyroid Function Tests: No results for input(s): TSH, T4TOTAL, FREET4, T3FREE, THYROIDAB in the last 72 hours. Anemia Panel: No results for input(s): VITAMINB12, FOLATE, FERRITIN, TIBC, IRON, RETICCTPCT in the last 72 hours. Sepsis Labs: No results for input(s): PROCALCITON, LATICACIDVEN in the last 168 hours.  Recent Results (from the past 240 hour(s))  Blood culture (routine x 2)     Status: None   Collection Time: 02/19/21  9:13 AM   Specimen: BLOOD LEFT WRIST  Result Value Ref Range Status   Specimen Description BLOOD LEFT WRIST  Final   Special Requests   Final    BOTTLES DRAWN AEROBIC AND ANAEROBIC Blood Culture adequate volume   Culture   Final    NO GROWTH 5 DAYS Performed at [facility], [address], [place], [place] [address]    Report Status 02/24/2021 FINAL  Final  Resp Panel by RT-PCR (Flu A&B, Covid) Nasopharyngeal Swab     Status: None   Collection Time: 02/19/21 10:37 AM   Specimen: Nasopharyngeal Swab; Nasopharyngeal(NP) swabs in vial transport medium  Result Value Ref Range Status   SARS Coronavirus 2 by RT PCR NEGATIVE NEGATIVE Final    Comment: (NOTE) SARS-CoV-2 target nucleic acids are NOT DETECTED.  The SARS-CoV-2 RNA is generally detectable in upper respiratory specimens during the acute phase of infection. The lowest concentration of SARS-CoV-2 viral copies this assay can detect is 138 copies/mL. A negative result does not preclude SARS-Cov-2 infection and should not be used as the sole basis for treatment or other patient management decisions. A negative result may occur with  improper specimen collection/handling, submission of specimen other than nasopharyngeal swab, presence of viral mutation(s)  within the areas targeted by this assay, and inadequate number of viral copies(<138 copies/mL). A negative result must be combined with clinical observations, patient history, and epidemiological information. The expected result is Negative.  Fact Sheet for Patients:  BloggerCourse.com  Fact Sheet for Healthcare Providers:  SeriousBroker.it  This test is no t yet approved or cleared by the [place] FDA and  has been authorized for detection and/or diagnosis of SARS-CoV-2 by FDA under an Emergency Use Authorization (EUA). This EUA will remain  in effect (meaning this test can be used) for the duration of the COVID-19 declaration under Section 564(b)(1) of the Act, 21 U.S.C.section 360bbb-3(b)(1), unless the authorization is terminated  or revoked sooner.       Influenza A by PCR NEGATIVE NEGATIVE Final   Influenza B by PCR NEGATIVE NEGATIVE Final    Comment: (NOTE) The Xpert Xpress SARS-CoV-2/FLU/RSV plus assay is intended as an aid in the diagnosis of influenza from Nasopharyngeal swab specimens and should not be used as a sole basis for treatment. Nasal washings and aspirates are unacceptable for Xpert Xpress SARS-CoV-2/FLU/RSV testing.  Fact Sheet for Patients: BloggerCourse.com  Fact Sheet for Healthcare Providers: SeriousBroker.it  This test is not yet approved or cleared by the [place] FDA and has been authorized for detection and/or diagnosis of SARS-CoV-2 by FDA under an Emergency  Use Authorization (EUA). This EUA will remain in effect (meaning this test can be used) for the duration of the COVID-19 declaration under Section 564(b)(1) of the Act, 21 U.S.C. section 360bbb-3(b)(1), unless the authorization is terminated or revoked.  Performed at [facility], [address] [place], [place] [address]   Culture, respiratory (tracheal aspirate)      Status: None   Collection Time: 02/19/21 11:51 AM   Specimen: Tracheal Aspirate; Respiratory  Result Value Ref Range Status   Specimen Description TRACHEAL ASPIRATE  Final   Special Requests Normal  Final   Gram Stain   Final    ABUNDANT WBC PRESENT,BOTH PMN AND MONONUCLEAR FEW GRAM POSITIVE COCCI RARE GRAM VARIABLE ROD    Culture   Final    RARE Normal respiratory flora-no Staph aureus or Pseudomonas seen Performed at [facility], [address] [place], [place] [address]    Report Status 02/22/2021 FINAL  Final  MRSA PCR Screening     Status: None   Collection Time: 02/19/21  3:20 PM   Specimen: Nasal Mucosa; Nasopharyngeal  Result Value Ref Range Status   MRSA by PCR NEGATIVE NEGATIVE Final    Comment:        The GeneXpert MRSA Assay (FDA approved for NASAL specimens only), is one component of a comprehensive MRSA colonization surveillance program. It is not intended to diagnose MRSA infection nor to guide or monitor treatment for MRSA infections. Performed at [facility], [address], [place], [place] [address]   Blood culture (routine x 2)     Status: None   Collection Time: 02/19/21  3:23 PM   Specimen: BLOOD  Result Value Ref Range Status   Specimen Description BLOOD LEFT ANTECUBITAL  Final   Special Requests   Final    BOTTLES DRAWN AEROBIC ONLY Blood Culture adequate volume   Culture   Final    NO GROWTH 5 DAYS Performed at [facility], [address] [place], [place] [address]    Report Status 02/24/2021 FINAL  Final  Respiratory (~20 pathogens) panel by PCR     Status: None   Collection Time: 02/22/21  6:42 PM  Result Value Ref Range Status   Adenovirus NOT DETECTED NOT DETECTED Final   Coronavirus 229E NOT DETECTED NOT DETECTED Final    Comment: (NOTE) The Coronavirus on the Respiratory Panel, DOES NOT test for the novel  Coronavirus (2019 nCoV)    Coronavirus HKU1 NOT DETECTED NOT DETECTED Final   Coronavirus NL63 NOT  DETECTED NOT DETECTED Final   Coronavirus OC43 NOT DETECTED NOT DETECTED Final   Metapneumovirus NOT DETECTED NOT DETECTED Final   Rhinovirus / Enterovirus NOT DETECTED NOT DETECTED Final   Influenza A NOT DETECTED NOT DETECTED Final   Influenza B NOT DETECTED NOT DETECTED Final   Parainfluenza Virus 1 NOT DETECTED NOT DETECTED Final   Parainfluenza Virus 2 NOT DETECTED NOT DETECTED Final   Parainfluenza Virus 3 NOT DETECTED NOT DETECTED Final   Parainfluenza Virus 4 NOT DETECTED NOT DETECTED Final   Respiratory Syncytial Virus NOT DETECTED NOT DETECTED Final   Bordetella pertussis NOT DETECTED NOT DETECTED Final   Bordetella Parapertussis NOT DETECTED NOT DETECTED Final   Chlamydophila pneumoniae NOT DETECTED NOT DETECTED Final   Mycoplasma pneumoniae NOT DETECTED NOT DETECTED Final    Comment: Performed at [facility], [address], [place], [place] [address]     Radiology Studies: DG Chest 2 View  Result Date: 02/26/2021 CLINICAL DATA:  Elevated diaphragm. EXAM: CHEST - 2 VIEW COMPARISON:  Recent radiographs including yesterday. FINDINGS: Unchanged elevation of right hemidiaphragm. Slight improvement in adjacent atelectasis. There is streaky atelectasis at the left lung base with trace left pleural effusion suspected. Mild cardiomegaly. Stable mediastinal contours. No acute airspace disease. No pneumothorax. No acute osseous abnormalities are seen. IMPRESSION: 1. Persistent elevation of the right hemidiaphragm with slight improvement in adjacent atelectasis. 2. Streaky atelectasis at the left lung base with probable trace left pleural effusion. 3. Stable cardiomegaly. Electronically Signed   By: Narda Rutherford M.D.   On: 02/26/2021 17:52     LOS: 8 days   Barnetta Chapel, MD [facility]  02/27/2021, 1:10 PM

## 2021-02-27 NOTE — Plan of Care (Signed)
  Problem: Respiratory: Goal: Ability to maintain adequate ventilation will improve Outcome: Progressing   Problem: Education: Goal: Knowledge of General Education information will improve Description: Including pain rating scale, medication(s)/side effects and non-pharmacologic comfort measures Outcome: Progressing   Problem: Clinical Measurements: Goal: Respiratory complications will improve Outcome: Progressing   Problem: Activity: Goal: Risk for activity intolerance will decrease Outcome: Progressing   Problem: Nutrition: Goal: Adequate nutrition will be maintained Outcome: Progressing   Problem: Elimination: Goal: Will not experience complications related to bowel motility Outcome: Progressing Goal: Will not experience complications related to urinary retention Outcome: Progressing   Problem: Pain Managment: Goal: General experience of comfort will improve Outcome: Progressing   Problem: Safety: Goal: Ability to remain free from injury will improve Outcome: Progressing   Problem: Skin Integrity: Goal: Risk for impaired skin integrity will decrease Outcome: Progressing

## 2021-02-27 NOTE — Progress Notes (Signed)
Occupational Therapy Treatment Patient Details Name: Danny Norris MRN: [id number] DOB: [birth year] Today's Date: 02/27/2021    History of present illness This is a 60 year old male presented to the ED on 5/10 in acute respiratory distress, room air sats 60s. Oriented on arrival. Reported about 2 wk h/o generalized weakness, HAs, myalgia, w/ body aches and chills. He is NOT vaccinated against COVID. Intubated 5/20-5/24.  Work up for PNA and sepsis.   OT comments  Pt and sister were in session. Pt and family were educated about compensation methods on how to complete with DME. Pt at this time was able to complete LE dressing with supervision for safety and management of o2 tubing. Pt was able to complete hand hygiene with supervision and no LOB. Pt is progressing towards goals at this time.     Follow Up Recommendations  No OT follow up    Equipment Recommendations  None recommended by OT    Recommendations for Other Services      Precautions / Restrictions Precautions Precautions: Fall       Mobility Bed Mobility               General bed mobility comments: Pt recieved in recliner on entry    Transfers Overall transfer level: Needs assistance Equipment used: Rolling walker (2 wheeled) Transfers: Sit to/from Stand Sit to Stand: Supervision Stand pivot transfers: Supervision            Balance                                           ADL either performed or assessed with clinical judgement   ADL Overall ADL's : Needs assistance/impaired Eating/Feeding: Independent;Sitting   Grooming: Wash/dry hands;Wash/dry face;Supervision/safety;Standing           Upper Body Dressing : Set up;Sitting   Lower Body Dressing: Set up;Cueing for safety;Cueing for sequencing   Toilet Transfer: Supervision/safety;Cueing for safety;Cueing for sequencing   Toileting- Clothing Manipulation and Hygiene: Supervision/safety;Cueing for safety;Cueing for  sequencing               Vision       Perception     Praxis      Cognition Arousal/Alertness: Awake/alert Behavior During Therapy: WFL for tasks assessed/performed Overall Cognitive Status: Within Functional Limits for tasks assessed                                          Exercises     Shoulder Instructions       General Comments      Pertinent Vitals/ Pain       Pain Assessment: No/denies pain  Home Living                                          Prior Functioning/Environment              Frequency  Min 2X/week        Progress Toward Goals  OT Goals(current goals can now be found in the care plan section)  Progress towards OT goals: Progressing toward goals  Acute Rehab OT Goals Patient Stated Goal: to go home OT Goal Formulation:  With patient Time For Goal Achievement: 03/10/21 Potential to Achieve Goals: Good ADL Goals Pt Will Perform Grooming: Independently;standing Pt Will Perform Upper Body Dressing: Independently Pt Will Perform Lower Body Dressing: Independently;sit to/from stand Pt Will Transfer to Toilet: Independently Pt Will Perform Toileting - Clothing Manipulation and hygiene: Independently;sit to/from stand Pt/caregiver will Perform Home Exercise Program: Both right and left upper extremity  Plan Discharge plan remains appropriate;Frequency remains appropriate    Co-evaluation                 AM-PAC OT "6 Clicks" Daily Activity     Outcome Measure   Help from another person eating meals?: None Help from another person taking care of personal grooming?: A Little Help from another person toileting, which includes using toliet, bedpan, or urinal?: A Little Help from another person bathing (including washing, rinsing, drying)?: A Little Help from another person to put on and taking off regular upper body clothing?: A Little Help from another person to put on and taking off regular  lower body clothing?: A Little 6 Click Score: 19    End of Session Equipment Utilized During Treatment: Rolling walker;Oxygen  OT Visit Diagnosis: Unsteadiness on feet (R26.81);Muscle weakness (generalized) (M62.81)   Activity Tolerance Patient tolerated treatment well   Patient Left in chair;with call bell/phone within reach;with family/visitor present   Nurse Communication          Time: 1301-1340 OT Time Calculation (min): 39 min  Charges: OT General Charges $OT Visit: 1 Visit OT Treatments $Self Care/Home Management : 38-52 mins  Alphia Moh OTR/L  Acute Rehab Services  [phone] office number [phone] pager number    Alphia Moh 02/27/2021, 2:08 PM

## 2021-02-28 DIAGNOSIS — R0902 Hypoxemia: Secondary | ICD-10-CM

## 2021-02-28 DIAGNOSIS — J986 Disorders of diaphragm: Secondary | ICD-10-CM

## 2021-02-28 LAB — GLUCOSE, CAPILLARY
Glucose-Capillary: 90 mg/dL (ref 70–99)
Glucose-Capillary: 128 mg/dL — ABNORMAL HIGH (ref 70–99)

## 2021-02-28 MED ORDER — APIXABAN 5 MG PO TABS: ORAL_TABLET | ORAL | 1 refills | Status: DC

## 2021-02-28 MED ORDER — DOCUSATE SODIUM 100 MG PO CAPS: 100.0000 mg | ORAL_CAPSULE | Freq: Two times a day (BID) | ORAL | 0 refills | Status: DC

## 2021-02-28 MED ORDER — ENSURE ENLIVE PO LIQD: 237.0000 mL | Freq: Two times a day (BID) | ORAL | 12 refills | Status: DC

## 2021-02-28 MED ORDER — POLYETHYLENE GLYCOL 3350 17 G PO PACK: 17.0000 g | PACK | Freq: Every day | ORAL | 0 refills | Status: DC

## 2021-02-28 NOTE — Discharge Summary (Signed)
Physician Discharge Summary  Patient ID: RYVER POBLETE MRN: [id number] DOB/AGE: Apr 08, 1961 60 y.o.  Admit date: 02/19/2021 Discharge date: 02/28/2021  Admission Diagnoses:  Discharge Diagnoses:  Active Problems:   Acute respiratory failure (HCC)   Community acquired pneumonia   Acute deep vein thrombosis (DVT) of both peroneal veins (HCC)   Discharged Condition: stable  Hospital Course: Patient is a 60 year old male, obese, with past medical history significant for MVP and MVR, hypertension, hyperlipidemia, mild PAH, mild AVI and history of contrast dye allergy.  Likely, patient has previously undiagnosed OSA.  Patient was admitted with acute respiratory distress, oxygen saturation of 60% on room air on arrival.  Patient endorsed about 2-week history of generalized weakness, headaches, myalgias and chills.  Over the week preceding admission, patient became progressively short of breath.  In the ED was initially stabilized on high flow nasal cannula but became diaphoretic and had to be emergently intubated and admitted to ICU.  She was managed for pneumonia, respiratory failure (acute versus acute on chronic), obesity, likely OSA and bilateral lower extremity DVT.  Patient was eventually extubated and transferred to the hospitalist service on 02/26/2021.  Patient's supplemental oxygen requirement has decreased progressively.  On the day of discharge, patient was on only 1.5 L of oxygen via nasal cannula.  Patient will be discharged to the care of the primary care provider.  Patient will need to be worked up for OSA and will likely need CPAP.  Acute hypoxic respiratory failure: -Likely secondary to combined effect of pneumonia, venous thromboembolism and OSA.    Elevated right hemidiaphragm of unknown chronicity/Community-acquired pneumonia: -Intubated on presentation and eventually extubated.   -Patient has completed her course of antibiotics.   -Oxygen requirement has decreased to 1.5 L of  supplemental oxygen per minute via nasal cannula.  -Patient was on CPAP during the hospital stay. -Patient will need formal sleep studies on outpatient basis (if not already done by the primary care provider).  Circulatory shock: -Likely multifactorial, including but not limited to septic shock.   Hypertension:  Continue to optimize blood pressure control.  Acute bilateral lower extremity DVT at risk PE:  -On Eliquis 5 Mg p.o. twice daily.    Hyperglycemia, A1c 6.3, continue to monitor Recent Labs       Lab Results  Component Value Date   HGBA1C 6.3 (H) 02/19/2021     History of MVP with/MVR, mild pulmonary hypertension-echocardiogram stable.  Continue HCTZ metoprolol.  Hyperlipidemia: Continues a statin  Hypernatremia: -Resolved  Consults: Patient was initially managed by the ICU team and transferred to the hospitalist team on 02/26/2021.  Significant Diagnostic Studies:  -Respiratory panel PCR came back negative. -Tracheal aspirate did not grow any organisms.  Did not grow any organisms.  Chest x-ray on admission revealed: 1.  Elevation right hemidiaphragm of uncertain etiology.  2. Ill-defined airspace opacity in each right perihilar region. Question atypical organism pneumonia. Mild left base atelectasis.  3. Apparent bilateral hilar adenopathy. Questionable left paratracheal adenopathy as well. This distribution of adenopathy may be seen with sarcoidosis. Neoplasm could present similarly. Chest CT advised to further evaluate for extent and distribution of apparent adenopathy.  4. Question thyroid enlargement given relative deviation toward the right of the lower cervical and upper thoracic trachea. CT again would be most helpful for potential thyroid mass/enlargement.  Graph echocardiogram revealed:  Echocardiogram revealed: 1. Left ventricular ejection fraction, by estimation, is 60 to 65%. The  left ventricle has normal function. The left ventricle  has no  regional  wall motion abnormalities. There is moderate left ventricular hypertrophy.  Left ventricular diastolic  parameters are indeterminate.  2. Right ventricular systolic function is normal. The right ventricular  size is normal. There is normal pulmonary artery systolic pressure.  3. Prolapse of the posterior MV leaflet with eccentric anterior MR  difficult to quantify. The mitral valve is abnormal. Mild to moderate  mitral valve regurgitation. No evidence of mitral stenosis.  4. The aortic valve is tricuspid. Aortic valve regurgitation is moderate.  5. Aortic dilatation noted. There is mild dilatation of the aortic root,  measuring 42 mm. There is mild dilatation of the ascending aorta,  measuring 41 mm.   Discharge Exam: Blood pressure (!) 137/94, pulse 65, temperature (!) 97.4 F (36.3 C), resp. rate 18, height 6' 0.01" (1.829 m), weight 112.2 kg, SpO2 94 %.  Disposition: Discharge disposition: 01-Home or Self Care  Discharge Instructions    Diet - low sodium heart healthy   Complete by: As directed    Increase activity slowly   Complete by: As directed      Allergies as of 02/28/2021      Reactions   Contrast Media [iodinated Diagnostic Agents] Rash      Medication List    STOP taking these medications   amLODipine-benazepril 10-40 MG capsule Commonly known as: LOTREL   CoQ10 200 MG Caps     TAKE these medications   apixaban 5 MG Tabs tablet Commonly known as: ELIQUIS Apixaban 10 mg po bid x 3 days, than 5 mg po bid   atorvastatin 20 MG tablet Commonly known as: LIPITOR Take 1 tablet (20 mg total) by mouth daily at 6 PM.   docusate sodium 100 MG capsule Commonly known as: COLACE Take 1 capsule (100 mg total) by mouth 2 (two) times daily.   feeding supplement Liqd Take 237 mLs by mouth 2 (two) times daily between meals.   hydrochlorothiazide 25 MG tablet Commonly known as: HYDRODIURIL TAKE 1 TABLET BY MOUTH EVERY DAY   metoprolol  succinate 50 MG 24 hr tablet Commonly known as: TOPROL-XL TAKE 1 TABLET BY MOUTH DAILY WITH OR IMMEDIATELY FOLLOWING A MEAL What changed: See the new instructions.   polyethylene glycol 17 g packet Commonly known as: MIRALAX / GLYCOLAX Take 17 g by mouth daily. Start taking on: Mar 01, 2021            Durable Medical Equipment  (From admission, onward)         Start     Ordered   02/28/21 1231  DME Oxygen  Once       Question Answer Comment  Length of Need 6 Months   Liters per Minute 1.5   Oxygen delivery system Gas      02/28/21 1231           Signed: Barnetta Chapel 02/28/2021, 12:32 PM

## 2021-02-28 NOTE — Progress Notes (Signed)
Physical Therapy Treatment Patient Details Name: Danny Norris MRN: [id number] DOB: 03-Dec-1960 Today's Date: 02/28/2021    History of Present Illness This is a 60 year old male presented to the ED on 5/10 in acute respiratory distress, room air sats 60s. Oriented on arrival. Reported about 2 wk h/o generalized weakness, HAs, myalgia, w/ body aches and chills. He is NOT vaccinated against COVID. Intubated 5/20-5/24.  Work up for PNA and sepsis.    PT Comments    Pt progressing towards physical therapy goals. Pt anticipates d/c home today with supplemental O2 and no AD, and is currently functioning at a gross supervision level. Pt ambulated both with and without RW and reports feeling equally comfortable. Posture appeared improved without the RW and I fear he would try to use the walker with 1 hand and carry something or try to attend to another task with the other hand at home, increasing risk for falls. Pt and sister-in-law both agreeable to not getting the RW. Will continue to follow.    Follow Up Recommendations  No PT follow up     Equipment Recommendations  None recommended by PT    Recommendations for Other Services       Precautions / Restrictions Precautions Precautions: Fall Precaution Comments: watch O2 Restrictions Weight Bearing Restrictions: No    Mobility  Bed Mobility               General bed mobility comments: Pt recieved in recliner on entry    Transfers Overall transfer level: Needs assistance Equipment used: Rolling walker (2 wheeled);None Transfers: Sit to/from Stand Sit to Stand: Supervision;Modified independent (Device/Increase time)         General transfer comment: Light supervision for safety progressing to mod I by end of session. No assist required with and without RW.  Ambulation/Gait Ambulation/Gait assistance: Min guard;Supervision Gait Distance (Feet): 280 Feet (x2) Assistive device: Rolling walker (2 wheeled);None Gait  Pattern/deviations: Step-through pattern;Decreased stride length;Trunk flexed;Drifts right/left Gait velocity: Decreased Gait velocity interpretation: 1.31 - 2.62 ft/sec, indicative of limited community ambulator General Gait Details: Initially with RW. Pt with very flexed posture and noted increased weight bearing through the RW. Pt able to make corrective changes for posture but unable to maintain. on RA sats decreased to 87% with further drop to 84% with seated rest. Second bout of ambulation without AD, and on 1L/min supplemental O2. Pt demonstrated improved posture and minimal gait deviations. Pt reports he feels comfortable ambulating without an AD. O2 sats decreased to 92% with a further decline to 90% with seated rest break.   Stairs             Wheelchair Mobility    Modified Rankin (Stroke Patients Only)       Balance Overall balance assessment: Needs assistance Sitting-balance support: No upper extremity supported;Feet supported Sitting balance-Leahy Scale: Good     Standing balance support: Bilateral upper extremity supported;No upper extremity supported;During functional activity Standing balance-Leahy Scale: Fair Standing balance comment: can stand statically without UE support                            Cognition Arousal/Alertness: Awake/alert Behavior During Therapy: WFL for tasks assessed/performed Overall Cognitive Status: Impaired/Different from baseline Area of Impairment: Problem solving                             Problem Solving: Slow processing  Exercises      General Comments        Pertinent Vitals/Pain Pain Assessment: No/denies pain    Home Living                      Prior Function            PT Goals (current goals can now be found in the care plan section) Acute Rehab PT Goals Patient Stated Goal: to go home PT Goal Formulation: With patient/family Time For Goal Achievement:  03/10/21 Potential to Achieve Goals: Good Progress towards PT goals: Progressing toward goals    Frequency    Min 3X/week      PT Plan Current plan remains appropriate;Equipment recommendations need to be updated    Co-evaluation              AM-PAC PT "6 Clicks" Mobility   Outcome Measure  Help needed turning from your back to your side while in a flat bed without using bedrails?: None Help needed moving from lying on your back to sitting on the side of a flat bed without using bedrails?: None Help needed moving to and from a bed to a chair (including a wheelchair)?: None Help needed standing up from a chair using your arms (e.g., wheelchair or bedside chair)?: A Little Help needed to walk in hospital room?: A Little Help needed climbing 3-5 steps with a railing? : A Little 6 Click Score: 21    End of Session Equipment Utilized During Treatment: Gait belt;Oxygen Activity Tolerance: Patient limited by fatigue Patient left: in chair;with call bell/phone within reach;with chair alarm set Nurse Communication: Mobility status PT Visit Diagnosis: Unsteadiness on feet (R26.81);Muscle weakness (generalized) (M62.81)     Time: 7510-2585 PT Time Calculation (min) (ACUTE ONLY): 18 min  Charges:  $Gait Training: 8-22 mins                     Conni Slipper, PT, DPT Acute Rehabilitation Services Pager: [phone] Office: [phone]    Marylynn Pearson 02/28/2021, 3:19 PM

## 2021-02-28 NOTE — TOC Transition Note (Signed)
Transition of Care [facility]) - CM/SW Discharge Note   Patient Details  Name: Danny Norris MRN: [id number] Date of Birth: 1961-07-18  [facility] of [facility] CM/SW Contact:  Bess Kinds, RN Phone Number: [phone] 02/28/2021, 2:39 PM   Clinical Narrative:     Spoke with patient and sister at the bedside. Demographics verified. Does not have PCP at this time, but already has an appointment scheduled for 6/27 with [facility]. Discussed need for home oxygen - referral to [facility] for delivery to room. PT saw patient today and changed recommendation to no RW needed. Will provide Eliquis assistance cards. Sister to provide transportation home. No further TOC needs identified.   Final next level of care: Home/Self Care Barriers to Discharge: No Barriers Identified   Patient Goals and CMS Choice   CMS Medicare.gov Compare Post Acute Care list provided to:: Patient Choice offered to / list presented to : Patient  Discharge Placement                       Discharge Plan and Services   Discharge Planning Services: CM Consult            DME Arranged: Oxygen DME Agency: Other - Comment [facility]) Date DME Agency Contacted: 02/28/21 Time DME Agency Contacted: [phone] Representative spoke with at DME Agency: Vaughan Basta HH Arranged: NA HH Agency: NA        Social Determinants of Health (SDOH) Interventions     Readmission Risk Interventions No flowsheet data found.

## 2021-02-28 NOTE — Progress Notes (Signed)
AVS given and reviewed with pt. Oxygen tank delivered to bedside. Medications discussed. Printed prescriptions provided to pt. All questions answered to satisfaction. Pt verbalized understanding of information given. Pt escorted off the unit with all belongings via wheelchair by staff member.

## 2021-02-28 NOTE — Plan of Care (Signed)
  Problem: Clinical Measurements: Goal: Ability to maintain clinical measurements within normal limits will improve Outcome: Progressing Goal: Will remain free from infection Outcome: Progressing Goal: Diagnostic test results will improve Outcome: Progressing Goal: Respiratory complications will improve Outcome: Progressing   Problem: Activity: Goal: Risk for activity intolerance will decrease Outcome: Progressing   Problem: Nutrition: Goal: Adequate nutrition will be maintained Outcome: Progressing   Problem: Elimination: Goal: Will not experience complications related to bowel motility Outcome: Progressing Goal: Will not experience complications related to urinary retention Outcome: Progressing   Problem: Pain Managment: Goal: General experience of comfort will improve Outcome: Progressing   Problem: Safety: Goal: Ability to remain free from injury will improve Outcome: Progressing   Problem: Skin Integrity: Goal: Risk for impaired skin integrity will decrease Outcome: Progressing

## 2021-02-28 NOTE — Progress Notes (Addendum)
SATURATION QUALIFICATIONS  Patient Saturations on Room Air at Rest = 90%  Patient Saturations on Room Air while Ambulating = 87%  Patient Saturations on 1.5 Liters of oxygen while Ambulating = 95%  Please briefly explain why patient needs home oxygen: desaturation

## 2021-03-08 ENCOUNTER — Other Ambulatory Visit: Payer: Self-pay | Admitting: Physician Assistant

## 2021-03-08 ENCOUNTER — Ambulatory Visit
Admission: RE | Admit: 2021-03-08 | Discharge: 2021-03-08 | Disposition: A | Discharge status: Home / Self Care | Payer: BC Managed Care – PPO | Source: Ambulatory Visit | Attending: Physician Assistant | Admitting: Physician Assistant

## 2021-03-08 ENCOUNTER — Other Ambulatory Visit: Payer: Self-pay

## 2021-03-08 DIAGNOSIS — R9389 Abnormal findings on diagnostic imaging of other specified body structures: Secondary | ICD-10-CM

## 2021-03-08 DIAGNOSIS — R590 Localized enlarged lymph nodes: Secondary | ICD-10-CM

## 2021-03-08 DIAGNOSIS — J986 Disorders of diaphragm: Secondary | ICD-10-CM

## 2021-03-08 DIAGNOSIS — J398 Other specified diseases of upper respiratory tract: Secondary | ICD-10-CM

## 2021-03-09 ENCOUNTER — Other Ambulatory Visit: Payer: Self-pay | Admitting: Physician Assistant

## 2021-03-09 DIAGNOSIS — R590 Localized enlarged lymph nodes: Secondary | ICD-10-CM

## 2021-03-16 ENCOUNTER — Other Ambulatory Visit (HOSPITAL_BASED_OUTPATIENT_CLINIC_OR_DEPARTMENT_OTHER): Payer: Self-pay

## 2021-03-16 DIAGNOSIS — G4733 Obstructive sleep apnea (adult) (pediatric): Secondary | ICD-10-CM

## 2021-03-17 ENCOUNTER — Ambulatory Visit (INDEPENDENT_AMBULATORY_CARE_PROVIDER_SITE_OTHER): Payer: BC Managed Care – PPO | Admitting: Pulmonary Disease

## 2021-03-17 ENCOUNTER — Other Ambulatory Visit: Payer: Self-pay

## 2021-03-17 ENCOUNTER — Encounter: Payer: Self-pay | Admitting: Pulmonary Disease

## 2021-03-17 VITALS — BP 146/80 | HR 70 | Ht 71.0 in | Wt 234.0 lb

## 2021-03-17 DIAGNOSIS — J9621 Acute and chronic respiratory failure with hypoxia: Secondary | ICD-10-CM | POA: Diagnosis not present

## 2021-03-17 DIAGNOSIS — I82413 Acute embolism and thrombosis of femoral vein, bilateral: Secondary | ICD-10-CM | POA: Diagnosis not present

## 2021-03-17 DIAGNOSIS — J986 Disorders of diaphragm: Secondary | ICD-10-CM | POA: Diagnosis not present

## 2021-03-17 DIAGNOSIS — R911 Solitary pulmonary nodule: Secondary | ICD-10-CM

## 2021-03-17 DIAGNOSIS — G4733 Obstructive sleep apnea (adult) (pediatric): Secondary | ICD-10-CM

## 2021-03-17 NOTE — Progress Notes (Signed)
Synopsis: Referred in June 2022 for abnormal CT chest by Knox Royalty, MD  Subjective:   PATIENT ID: Danny Norris GENDER: male DOB: January 27, 1961, MRN: [id number]  Chief Complaint  Patient presents with   Consult    Abnormal CT    This is a 60 year old gentleman, history of dyslipidemia, hypertension, mitral valve regurgitation with a recent hospitalization.Patient was admitted to the hospital by critical care services.  Found to have acute hypoxemic respiratory failure, viral prodrome type symptoms, pulmonary opacities negative for COVID.  Was treated for community-acquired pneumonia RVP was negative.  Unable to get CTA due to AKI.  Patient had bilateral lower extremity duplex completed on 02/20/2021 which revealed acute DVTs in the right and left peroneal veins and left posterior tibial deep veins.  Patient was started on anticoagulation and discharged on Eliquis.  Discharge summary from Dr. Dartha Lodge was reviewed.  Also during hospitalization had noticed a right elevated hemidiaphragm felt to be chronic.  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   Past Medical History:  Diagnosis Date   Dyslipidemia    HTN (hypertension)    Mitral valve regurgitation    a. 05/2011: TEE showing significant MVP especially involving the P2 segment without evidence for ruptured chordae and had eccentric MR anteriorly directed. b. 03/2015: echo showing EF 65-70% with posterior MV leaflet thickening and prolapse with mild MR.   Normal coronary arteries    a. normal cors by cath in 08/2011     Family History  Problem Relation Age of Onset   Sleep apnea Brother    Congestive Heart Failure Mother      Past Surgical History:  Procedure Laterality Date   EYE SURGERY  1975   HERNIA REPAIR Right 2009   Rt inguinal repair   LEFT AND RIGHT HEART CATHETERIZATION WITH CORONARY ANGIOGRAM N/A 09/01/2011   Procedure: LEFT AND RIGHT HEART CATHETERIZATION WITH CORONARY  ANGIOGRAM;  Surgeon: Lennette Bihari, MD;  Location: [facility] CATH LAB;  Service: Cardiovascular;  Laterality: N/A;    Social History   Socioeconomic History   Marital status: Single    Spouse name: Not on file   Number of children: Not on file   Years of education: Not on file   Highest education level: Not on file  Occupational History   Not on file  Tobacco Use   Smoking status: Never   Smokeless tobacco: Never  Substance and Sexual Activity   Alcohol use: No    Alcohol/week: 0.0 standard drinks   Drug use: Not on file   Sexual activity: Not on file  Other Topics Concern   Not on file  Social History Narrative   Not on file   Social Determinants of Health   Financial Resource Strain: Not on file  Food Insecurity: Not on file  Transportation Needs: Not on file  Physical Activity: Not on file  Stress: Not on file  Social Connections: Not on file  Intimate Partner Violence: Not on file     Allergies  Allergen Reactions   Contrast Media [Iodinated Diagnostic Agents] Rash     Outpatient Medications Prior to Visit  Medication Sig Dispense Refill   apixaban (ELIQUIS) 5 MG TABS tablet Apixaban 10 mg po bid x 3 days, than 5 mg po bid 66 tablet 1   atorvastatin (LIPITOR) 20 MG tablet Take 1 tablet (20 mg total) by mouth daily at 6 PM. 30 tablet 6   docusate sodium (COLACE) 100 MG capsule  Take 1 capsule (100 mg total) by mouth 2 (two) times daily. 10 capsule 0   feeding supplement (ENSURE ENLIVE / ENSURE PLUS) LIQD Take 237 mLs by mouth 2 (two) times daily between meals. 237 mL 12   hydrochlorothiazide (HYDRODIURIL) 25 MG tablet TAKE 1 TABLET BY MOUTH EVERY DAY (Patient taking differently: Take 25 mg by mouth daily.) 90 tablet 1   metoprolol succinate (TOPROL-XL) 50 MG 24 hr tablet TAKE 1 TABLET BY MOUTH DAILY WITH OR IMMEDIATELY FOLLOWING A MEAL (Patient taking differently: Take 50 mg by mouth daily at 6 (six) AM.) 90 tablet 1   polyethylene glycol (MIRALAX / GLYCOLAX) 17 g packet  Take 17 g by mouth daily. 14 each 0   No facility-administered medications prior to visit.    Review of Systems  Constitutional:  Negative for chills, fever, malaise/fatigue and weight loss.  HENT:  Negative for hearing loss, sore throat and tinnitus.   Eyes:  Negative for blurred vision and double vision.  Respiratory:  Positive for shortness of breath. Negative for cough, hemoptysis, sputum production, wheezing and stridor.   Cardiovascular:  Negative for chest pain, palpitations, orthopnea, leg swelling and PND.  Gastrointestinal:  Negative for abdominal pain, constipation, diarrhea, heartburn, nausea and vomiting.  Genitourinary:  Negative for dysuria, hematuria and urgency.  Musculoskeletal:  Negative for joint pain and myalgias.  Skin:  Negative for itching and rash.  Neurological:  Negative for dizziness, tingling, weakness and headaches.  Endo/Heme/Allergies:  Negative for environmental allergies. Does not bruise/bleed easily.  Psychiatric/Behavioral:  Negative for depression. The patient is not nervous/anxious and does not have insomnia.   All other systems reviewed and are negative.   Objective:  Physical Exam Vitals reviewed.  Constitutional:      General: He is not in acute distress.    Appearance: He is well-developed.  HENT:     Head: Normocephalic and atraumatic.  Eyes:     General: No scleral icterus.    Conjunctiva/sclera: Conjunctivae normal.     Pupils: Pupils are equal, round, and reactive to light.  Neck:     Vascular: No JVD.     Trachea: No tracheal deviation.  Cardiovascular:     Rate and Rhythm: Normal rate and regular rhythm.     Heart sounds: Murmur heard.  Pulmonary:     Effort: Pulmonary effort is normal. No tachypnea, accessory muscle usage or respiratory distress.     Breath sounds: No stridor. No wheezing, rhonchi or rales.  Abdominal:     General: Bowel sounds are normal. There is no distension.     Palpations: Abdomen is soft.      Tenderness: There is no abdominal tenderness.  Musculoskeletal:        General: No tenderness.     Cervical back: Neck supple.     Right lower leg: Edema present.     Left lower leg: Edema present.  Lymphadenopathy:     Cervical: No cervical adenopathy.  Skin:    General: Skin is warm and dry.     Capillary Refill: Capillary refill takes less than 2 seconds.     Findings: No rash.  Neurological:     Mental Status: He is alert and oriented to person, place, and time.  Psychiatric:        Behavior: Behavior normal.     Vitals:   03/17/21 1405  BP: (!) 146/80  Pulse: 70  SpO2: 97%  Weight: 234 lb (106.1 kg)  Height: 5\' 11"  (1.803 m)  97% on 2 L nasal cannula BMI Readings from Last 3 Encounters:  03/17/21 32.64 kg/m  02/26/21 33.54 kg/m  09/17/19 31.27 kg/m   Wt Readings from Last 3 Encounters:  03/17/21 234 lb (106.1 kg)  02/26/21 247 lb 5.7 oz (112.2 kg)  09/17/19 237 lb (107.5 kg)     CBC    Component Value Date/Time   WBC 8.6 02/27/2021 0224   RBC 4.73 02/27/2021 0224   HGB 14.5 02/27/2021 0224   HGB 15.5 08/08/2019 0813   HCT 46.1 02/27/2021 0224   HCT 47.1 08/08/2019 0813   PLT 207 02/27/2021 0224   PLT 205 08/08/2019 0813   MCV 97.5 02/27/2021 0224   MCV 91 08/08/2019 0813   MCH 30.7 02/27/2021 0224   MCHC 31.5 02/27/2021 0224   RDW 14.6 02/27/2021 0224   RDW 15.9 (H) 08/08/2019 0813   LYMPHSABS 1.4 02/19/2021 1031   MONOABS 0.6 02/19/2021 1031   EOSABS 0.0 02/19/2021 1031   BASOSABS 0.0 02/19/2021 1031     Chest Imaging:  03/22/2021: Subpleural density along the posterior medial left lower lobe measuring 2.9 cm.  Patient is a relatively non-smoker except for a few times as a teenager.  No other significant risk factors.  Unsure what this represents.  We discussed all the various options as well as potential for underlying malignancy. The patient's images have been independently reviewed by me.    02/27/2021 sniff test: Right paralyzed  hemidiaphragm. The patient's images have been independently reviewed by me.     Pulmonary Functions Testing Results: No flowsheet data found.  FeNO:   Pathology:   Echocardiogram:   Heart Catheterization:     Assessment & Plan:     ICD-10-CM   1. 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 Acute bilateral deep vein thrombosis (DVT) of femoral veins (HCC)  I82.413     4. Acute on chronic respiratory failure with hypoxemia (HCC)  J96.21     5. OSA (obstructive sleep apnea)  G47.33       Discussion:  This is a 60 year old gentleman, new lung nodule within the left lower lobe medial location adjacent to the aorta.  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  Recent hospital admission with acute bilateral DVT and respiratory failure.  Unable to do CTA due to CKD baseline.  Presumed pulmonary embolism now with oxygen requirement.  He is hypoxemic with ambulation today in the office and recovered normal on 2 L nasal cannula.  Plan: Continue DME supply for oxygen We discussed the risk benefits and alternatives of proceeding with additional imaging for the left lower lobe nodule. Patient is agreeable to order a nuclear medicine PET scan for further evaluation. Pending upon PET scan we could discuss consideration for biopsy if needed. He is currently anticoagulated and with respiratory failure and bilateral DVT we will make the presumption he has pulmonary emboli.  He would need at least 6 to 8 weeks of anticoagulation before consideration of biopsy.  Therefore we will obtain the PET scan in the meantime and then have the patient follow-up to discuss neck steps.  Already established care with sleep physician this past week.  Sleep study has been ordered.  Encouraged follow-up with him after sleep study.  We appreciate the consultation.    Current  Outpatient Medications:    apixaban (ELIQUIS) 5 MG  TABS tablet, Apixaban 10 mg po bid x 3 days, than 5 mg po bid, Disp: 66 tablet, Rfl: 1   atorvastatin (LIPITOR) 20 MG tablet, Take 1 tablet (20 mg total) by mouth daily at 6 PM., Disp: 30 tablet, Rfl: 6   docusate sodium (COLACE) 100 MG capsule, Take 1 capsule (100 mg total) by mouth 2 (two) times daily., Disp: 10 capsule, Rfl: 0   feeding supplement (ENSURE ENLIVE / ENSURE PLUS) LIQD, Take 237 mLs by mouth 2 (two) times daily between meals., Disp: 237 mL, Rfl: 12   hydrochlorothiazide (HYDRODIURIL) 25 MG tablet, TAKE 1 TABLET BY MOUTH EVERY DAY (Patient taking differently: Take 25 mg by mouth daily.), Disp: 90 tablet, Rfl: 1   metoprolol succinate (TOPROL-XL) 50 MG 24 hr tablet, TAKE 1 TABLET BY MOUTH DAILY WITH OR IMMEDIATELY FOLLOWING A MEAL (Patient taking differently: Take 50 mg by mouth daily at 6 (six) AM.), Disp: 90 tablet, Rfl: 1   polyethylene glycol (MIRALAX / GLYCOLAX) 17 g packet, Take 17 g by mouth daily., Disp: 14 each, Rfl: 0  I spent 42 minutes dedicated to the care of this patient on the date of this encounter to include pre-visit review of records, face-to-face time with the patient discussing conditions above, post visit ordering of testing, clinical documentation with the electronic health record, making appropriate referrals as documented, and communicating necessary findings to members of the patients care team.   Josephine IgoBradley L Delara Shepheard, DO [facility] 03/17/2021 2:36 PM

## 2021-03-17 NOTE — Patient Instructions (Addendum)
Thank you for visiting Dr. Tonia Brooms at [facility]. Today we recommend the following:  Orders Placed This Encounter  Procedures   NM PET Image Initial (PI) Skull Base To Thigh    Return in about 4 weeks (around 04/14/2021) for with APP or Dr. Tonia Brooms.    Please do your part to reduce the spread of COVID-19.

## 2021-03-18 ENCOUNTER — Telehealth: Payer: Self-pay | Admitting: Pulmonary Disease

## 2021-03-18 NOTE — Telephone Encounter (Signed)
Called and spoke with patient and Anglea per DPR to let them know of message from Dr. Tonia Brooms. They both expressed understanding. Nothing further needed at this time.

## 2021-03-18 NOTE — Telephone Encounter (Signed)
Called and spoke with patient. He stated that he had a CT scan with contrast about 10 years and had an allergic reaction. He described the reaction as a rash that developed around his neck. During the exam they were able to give Benadryl and the rash disappeared. He was able to finish the exam and did not have any problems afterwards.   He is concerned because he was told that the PET scan involves contrast media and he is not sure if it is the same as the type he had an allergic reaction to.   Dr. Tonia Brooms, can you please advise? Thanks!

## 2021-04-06 ENCOUNTER — Ambulatory Visit (HOSPITAL_COMMUNITY): Payer: BC Managed Care – PPO

## 2021-04-09 ENCOUNTER — Institutional Professional Consult (permissible substitution): Payer: BC Managed Care – PPO | Admitting: Pulmonary Disease

## 2021-04-14 ENCOUNTER — Ambulatory Visit: Payer: BC Managed Care – PPO | Admitting: Primary Care

## 2021-04-14 ENCOUNTER — Telehealth: Payer: Self-pay | Admitting: Pulmonary Disease

## 2021-04-15 NOTE — Telephone Encounter (Signed)
Glori Bickers was obtained [facility]#[id number]  04/01/21-05/30/21 I left a message for Clydie Braun to call me back Tobe Sos

## 2021-04-22 ENCOUNTER — Other Ambulatory Visit: Payer: Self-pay

## 2021-04-22 ENCOUNTER — Encounter (HOSPITAL_COMMUNITY)
Admission: RE | Admit: 2021-04-22 | Discharge: 2021-04-22 | Disposition: A | Discharge status: Home / Self Care | Payer: BC Managed Care – PPO | Source: Ambulatory Visit | Attending: Pulmonary Disease | Admitting: Pulmonary Disease

## 2021-04-22 DIAGNOSIS — R911 Solitary pulmonary nodule: Secondary | ICD-10-CM | POA: Diagnosis not present

## 2021-04-22 LAB — GLUCOSE, CAPILLARY: Glucose-Capillary: 132 mg/dL — ABNORMAL HIGH (ref 70–99)

## 2021-04-22 MED ORDER — FLUDEOXYGLUCOSE F - 18 (FDG) INJECTION: 12.0000 | Freq: Once | INTRAVENOUS | Status: DC | PRN

## 2021-04-26 ENCOUNTER — Ambulatory Visit (INDEPENDENT_AMBULATORY_CARE_PROVIDER_SITE_OTHER): Payer: BC Managed Care – PPO | Admitting: Acute Care

## 2021-04-26 ENCOUNTER — Other Ambulatory Visit: Payer: Self-pay

## 2021-04-26 ENCOUNTER — Encounter: Payer: Self-pay | Admitting: Acute Care

## 2021-04-26 VITALS — BP 137/88 | HR 69 | Temp 97.6°F | Ht 72.0 in | Wt 235.2 lb

## 2021-04-26 DIAGNOSIS — J9600 Acute respiratory failure, unspecified whether with hypoxia or hypercapnia: Secondary | ICD-10-CM

## 2021-04-26 DIAGNOSIS — O2230 Deep phlebothrombosis in pregnancy, unspecified trimester: Secondary | ICD-10-CM

## 2021-04-26 DIAGNOSIS — G4733 Obstructive sleep apnea (adult) (pediatric): Secondary | ICD-10-CM | POA: Diagnosis not present

## 2021-04-26 DIAGNOSIS — R911 Solitary pulmonary nodule: Secondary | ICD-10-CM | POA: Diagnosis not present

## 2021-04-26 DIAGNOSIS — R0902 Hypoxemia: Secondary | ICD-10-CM

## 2021-04-26 NOTE — Patient Instructions (Addendum)
It is good to see you today. Your PET scan was negative for concern regarding the pulmonary nodule we saw on the CT Scan. This is great news.  We will do a follow up CT Chest without in 6 months as follow up.  Continue Eliquis as you have been doing We have walked you in the office. Your oxygen level dropped to 89% with exertion. You can stop using oxygen while resting at home. 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 Use your oxygen when you are walking. We will evaluate for returning to work at that time. Follow up with Maralyn Sago NP in 2 weeks  Please contact office for sooner follow up if symptoms do not improve or worsen or seek emergency care

## 2021-04-26 NOTE — Progress Notes (Signed)
History of Present Illness Danny Norris is a 60 y.o. male remote smoker years ago  smoker with acute hypoxic respiratory failure 2/2 pneumonia requiring hospital admission (acute versus acute on chronic 02/19/21- 02/28/2021)  (ventilated x 4 days) , obesity, likely OSA and bilateral lower extremity DVT. He remains on his oxygen at 1.5 L , and remains somewhat deconditioned. On CT chest in the hospital, there was notation of a  left lower lobe nodule . He had follow up PET scan 7/ 21/2022, and is here to discuss results.He is followed by Dr. Tonia Brooms.   04/26/2021 Pt. Presents for follow up. PET scan Chest was done 04/22/2021, which previously demonstrated subpleural density posteromedially in the left lower lobe has resolved, consistent with resolved atelectasis or inflammation. There was no evidence of thoracic malignancy. I have reviewed these results with the patient and both he and his sister verbalized understanding. We will do a 6 month follow up scan to re-check this area . He is still wearing his oxygen at 1.5L and is anxious to get rid of it and go back to work. We walked him in the office today and on RA with exertion his sats did drop to 89%. He had witnessed sleep apnea in the hospital, and knows he needs to wear his oxygen at bedtime. He has a sleep study the end of August. I discussed pulmonary rehab to help with the respiratory and physical  deconditioning. He is interested in rehab. He denies and chest pain , no fever or increase in secretions.  He is compliant with his Eliquis, no leg swelling or dyspnea. He works as a Holiday representative at [employer], and is anxious to get back to work.   Test Results: 04/22/2021 1. The previously demonstrated subpleural density posteromedially in the left lower lobe has resolved, consistent with resolved atelectasis or inflammation. No evidence of thoracic malignancy. 2. No suspicious hypermetabolic activity within the neck, chest, abdomen or pelvis. 3. Chronic  right hemidiaphragm elevation and right basilar atelectasis. Cholelithiasis. Aortic Atherosclerosis (ICD10-I70.0).  -Respiratory panel PCR came back negative. -Tracheal aspirate did not grow any organisms.  Did not grow any organisms.   Chest x-ray on admission revealed: 1.  Elevation right hemidiaphragm of uncertain etiology.   2. Ill-defined airspace opacity in each right perihilar region. Question atypical organism pneumonia. Mild left base atelectasis.   3. Apparent bilateral hilar adenopathy. Questionable left paratracheal adenopathy as well. This distribution of adenopathy may be seen with sarcoidosis. Neoplasm could present similarly. Chest CT advised to further evaluate for extent and distribution of apparent adenopathy.   4. Question thyroid enlargement given relative deviation toward the right of the lower cervical and upper thoracic trachea. CT again would be most helpful for potential thyroid mass/enlargement.  Graph echocardiogram revealed:   Echocardiogram revealed: 1. Left ventricular ejection fraction, by estimation, is 60 to 65%. The  left ventricle has normal function. The left ventricle has no regional  wall motion abnormalities. There is moderate left ventricular hypertrophy.  Left ventricular diastolic  parameters are indeterminate.   2. Right ventricular systolic function is normal. The right ventricular  size is normal. There is normal pulmonary artery systolic pressure.   3. Prolapse of the posterior MV leaflet with eccentric anterior MR  difficult to quantify. The mitral valve is abnormal. Mild to moderate  mitral valve regurgitation. No evidence of mitral stenosis.   4. The aortic valve is tricuspid. Aortic valve regurgitation is moderate.   5. Aortic dilatation noted. There is mild  dilatation of the aortic root,  measuring 42 mm. There is mild dilatation of the ascending aorta,  measuring 41 mm.      CBC Latest Ref Rng & Units 02/27/2021 02/26/2021  02/25/2021  WBC 4.0 - 10.5 K/uL 8.6 8.4 9.5  Hemoglobin 13.0 - 17.0 g/dL 16.114.5 09.615.7 04.514.8  Hematocrit 39.0 - 52.0 % 46.1 49.4 46.6  Platelets 150 - 400 K/uL 207 176 186    BMP Latest Ref Rng & Units 02/27/2021 02/26/2021 02/25/2021  Glucose 70 - 99 mg/dL 95 93 409(W102(H)  BUN 6 - 20 mg/dL 12 14 14   Creatinine 0.61 - 1.24 mg/dL 1.190.90 1.470.72 8.290.85  BUN/Creat Ratio 9 - 20 - - -  Sodium 135 - 145 mmol/L 142 141 140  Potassium 3.5 - 5.1 mmol/L 3.9 4.1 4.5  Chloride 98 - 111 mmol/L 96(L) 102 98  CO2 22 - 32 mmol/L 38(H) 34(H) 33(H)  Calcium 8.9 - 10.3 mg/dL 56.210.2 13.010.0 9.8    BNP    Component Value Date/Time   BNP 206.9 (H) 02/19/2021 1033    ProBNP No results found for: PROBNP  PFT No results found for: FEV1PRE, FEV1POST, FVCPRE, FVCPOST, TLC, DLCOUNC, PREFEV1FVCRT, PSTFEV1FVCRT  NM PET Image Initial (PI) Skull Base To Thigh  Result Date: 04/23/2021 CLINICAL DATA:  Initial treatment strategy for left lower lobe pulmonary nodule. EXAM: NUCLEAR MEDICINE PET SKULL BASE TO THIGH TECHNIQUE: 11.6 mCi F-18 FDG was injected intravenously. Full-ring PET imaging was performed from the skull base to thigh after the radiotracer. CT data was obtained and used for attenuation correction and anatomic localization. Fasting blood glucose: 132 mg/dl COMPARISON:  CT neck and chest 03/08/2021 FINDINGS: Mediastinal blood pool activity: SUV max 2.6 NECK: No hypermetabolic cervical lymph nodes are identified.Symmetric activity in the lymphoid tissue of Waldeyer's ring, within physiologic limits. No suspicious activity within the pharyngeal mucosal space. Incidental CT findings: none CHEST: There are no hypermetabolic mediastinal, hilar or axillary lymph nodes. No hypermetabolic pulmonary activity or suspicious nodularity. The subpleural density previously noted posteromedially in the left lower lobe has resolved. No hypermetabolic activity in this area. Incidental CT findings: Chronic elevation of the right hemidiaphragm with  associated right basilar atelectasis. Mild aortic atherosclerosis. ABDOMEN/PELVIS: There is no hypermetabolic activity within the liver, adrenal glands, spleen or pancreas. There is no hypermetabolic nodal activity. Incidental CT findings: Mild subjective hepatic steatosis. There are small calcified gallstones. Mild aortoiliac atherosclerosis. SKELETON: There is no hypermetabolic activity to suggest osseous metastatic disease. Incidental CT findings: Chronic deformities in the thoracic spine and mild spondylosis. Stable sebaceous cyst in the midline upper back, without hypermetabolic activity. IMPRESSION: 1. The previously demonstrated subpleural density posteromedially in the left lower lobe has resolved, consistent with resolved atelectasis or inflammation. No evidence of thoracic malignancy. 2. No suspicious hypermetabolic activity within the neck, chest, abdomen or pelvis. 3. Chronic right hemidiaphragm elevation and right basilar atelectasis. Cholelithiasis. Aortic Atherosclerosis (ICD10-I70.0). Electronically Signed   By: Carey BullocksWilliam  Veazey M.D.   On: 04/23/2021 15:51     Past medical hx Past Medical History:  Diagnosis Date   Dyslipidemia    HTN (hypertension)    Mitral valve regurgitation    a. 05/2011: TEE showing significant MVP especially involving the P2 segment without evidence for ruptured chordae and had eccentric MR anteriorly directed. b. 03/2015: echo showing EF 65-70% with posterior MV leaflet thickening and prolapse with mild MR.   Normal coronary arteries    a. normal cors by cath in 08/2011  Social History   Tobacco Use   Smoking status: Never   Smokeless tobacco: Never  Substance Use Topics   Alcohol use: No    Alcohol/week: 0.0 standard drinks    Mr.Poli reports that he has never smoked. He has never used smokeless tobacco. He reports that he does not drink alcohol.  Tobacco Cessation: Remote smoking history, many years ago.    Past surgical hx, Family hx,  Social hx all reviewed.  Current Outpatient Medications on File Prior to Visit  Medication Sig   apixaban (ELIQUIS) 5 MG TABS tablet Apixaban 10 mg po bid x 3 days, than 5 mg po bid   atorvastatin (LIPITOR) 20 MG tablet Take 1 tablet (20 mg total) by mouth daily at 6 PM.   docusate sodium (COLACE) 100 MG capsule Take 1 capsule (100 mg total) by mouth 2 (two) times daily.   feeding supplement (ENSURE ENLIVE / ENSURE PLUS) LIQD Take 237 mLs by mouth 2 (two) times daily between meals.   hydrochlorothiazide (HYDRODIURIL) 25 MG tablet TAKE 1 TABLET BY MOUTH EVERY DAY (Patient taking differently: Take 25 mg by mouth daily.)   metoprolol succinate (TOPROL-XL) 50 MG 24 hr tablet TAKE 1 TABLET BY MOUTH DAILY WITH OR IMMEDIATELY FOLLOWING A MEAL (Patient taking differently: Take 50 mg by mouth daily at 6 (six) AM.)   polyethylene glycol (MIRALAX / GLYCOLAX) 17 g packet Take 17 g by mouth daily.   Current Facility-Administered Medications on File Prior to Visit  Medication   fludeoxyglucose F - 18 (FDG) injection 12 millicurie     Allergies  Allergen Reactions   Contrast Media [Iodinated Diagnostic Agents] Rash    Review Of Systems:  Constitutional:   No  weight loss, night sweats,  Fevers, chills, + fatigue, or  lassitude.  HEENT:   No headaches,  Difficulty swallowing,  Tooth/dental problems, or  Sore throat,                No sneezing, itching, ear ache, nasal congestion, post nasal drip,   CV:  No chest pain,  Orthopnea, PND, swelling in lower extremities, anasarca, dizziness, palpitations, syncope.   GI  No heartburn, indigestion, abdominal pain, nausea, vomiting, diarrhea, change in bowel habits, loss of appetite, bloody stools.   Resp: + shortness of breath with exertion none at rest.  No excess mucus, no productive cough,  No non-productive cough,  No coughing up of blood.  No change in color of mucus.  No wheezing.  No chest wall deformity  Skin: no rash or lesions.  GU: no  dysuria, change in color of urine, no urgency or frequency.  No flank pain, no hematuria   MS:  No joint pain or swelling.  No decreased range of motion.  No back pain.  Psych:  No change in mood or affect. No depression or anxiety.  No memory loss.   Vital Signs BP 137/88 (BP Location: Left Arm, Cuff Size: Normal)   Pulse 69   Temp 97.6 F (36.4 C) (Oral)   Ht 6' (1.829 m)   Wt 235 lb 3.2 oz (106.7 kg)   SpO2 95%   BMI 31.90 kg/m    Physical Exam:  General- No distress,  A&O x 3, pleasant ENT: No sinus tenderness, TM clear, pale nasal mucosa, no oral exudate,no post nasal drip, no LAN Cardiac: S1, S2, regular rate and rhythm, no murmur Chest: No wheeze/ rales/ dullness; no accessory muscle use, no nasal flaring, no sternal retractions, diminished per bases.  Abd.: Soft Non-tender, ND, BS +, Body mass index is 31.9 kg/m. Ext: No clubbing cyanosis, edema Neuro:  physically deconditioned, A&O x 3, MAE x 4, approrpiate Skin: No rashes, warm and dry, intact Psych: normal mood and behavior   Assessment/Plan  Acute Respiratory Failure Slow to resolve Still requiring oxygen Plan We have walked you in the office. Your oxygen level dropped to 89% with exertion. You can stop using oxygen while resting at home. Saturation goal is always > 88% 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 Use your oxygen when you are walking. We will evaluate for returning to work at that time.  Pulmonary Nodule Noted on CT Chest 01/2021 Previously demonstrated subpleural density posteromedially in the left lower lobe has resolved, consistent with resolved atelectasis or inflammation. No evidence of thoracic malignancy. Plan Follow up CT Chest  without in 6 months Follow up OV to review results after CT  Suspected OSA Nocturnal desaturations Plan Sleep Study as scheduled and of August. Will do follow up OV to review results Continue wearing oxygen every night as you have been doing.   DVT Plan Continue Eliquis as you have been doing.   I spent 40 minutes dedicated to the care of this patient on the date of this encounter to include pre-visit review of records, face-to-face time with the patient discussing conditions above, post visit ordering of testing, clinical documentation with the electronic health record, making appropriate referrals as documented, and communicating necessary information to the patient's healthcare team.   Once patient can be weaned off oxygen we can clear him for work.   Bevelyn Ngo, NP 04/26/2021  10:37 AM

## 2021-04-29 NOTE — Telephone Encounter (Signed)
Libby, please advise on update. Thanks.  

## 2021-05-02 ENCOUNTER — Ambulatory Visit (HOSPITAL_BASED_OUTPATIENT_CLINIC_OR_DEPARTMENT_OTHER): Payer: BC Managed Care – PPO | Attending: Internal Medicine | Admitting: Internal Medicine

## 2021-05-02 ENCOUNTER — Other Ambulatory Visit: Payer: Self-pay

## 2021-05-02 DIAGNOSIS — G4733 Obstructive sleep apnea (adult) (pediatric): Secondary | ICD-10-CM | POA: Insufficient documentation

## 2021-05-02 DIAGNOSIS — J9600 Acute respiratory failure, unspecified whether with hypoxia or hypercapnia: Secondary | ICD-10-CM | POA: Diagnosis present

## 2021-05-02 DIAGNOSIS — R0902 Hypoxemia: Secondary | ICD-10-CM | POA: Insufficient documentation

## 2021-05-05 NOTE — Telephone Encounter (Signed)
Lmtcb again spoke to [place]  who will have someone call be back no Clydie Braun there Tobe Sos

## 2021-05-06 NOTE — Progress Notes (Signed)
These results were shared with the patient at his 04/26/2021 office visit. Please see 04/26/2021 office visit note Angelique Blonder please make sure PET scan results have been sent to PCP. Plan is for a 66-month follow-up CT, and follow-up with Dr. Tonia Brooms or myself after CT to evaluate results. Patient verbalized understanding of the results and had no further questions at completion of the call.

## 2021-05-07 ENCOUNTER — Other Ambulatory Visit (HOSPITAL_BASED_OUTPATIENT_CLINIC_OR_DEPARTMENT_OTHER): Payer: Self-pay

## 2021-05-07 DIAGNOSIS — G4733 Obstructive sleep apnea (adult) (pediatric): Secondary | ICD-10-CM

## 2021-05-08 NOTE — Progress Notes (Signed)
PCCM: thanks for seeing him Josephine Igo, DO [facility] 05/08/2021 1:17 PM

## 2021-05-11 NOTE — Telephone Encounter (Signed)
Spoke to [place] @ insurance this Pet Was authorized [facility]#[id number] 04/01/21-05/04/21 pet was done 04/22/21 call ref# [id number] Tobe Sos

## 2021-05-12 ENCOUNTER — Ambulatory Visit (INDEPENDENT_AMBULATORY_CARE_PROVIDER_SITE_OTHER): Payer: BC Managed Care – PPO | Admitting: Acute Care

## 2021-05-12 ENCOUNTER — Encounter: Payer: Self-pay | Admitting: Acute Care

## 2021-05-12 ENCOUNTER — Other Ambulatory Visit: Payer: Self-pay

## 2021-05-12 VITALS — BP 138/88 | HR 101 | Temp 97.8°F | Ht 72.0 in | Wt 237.8 lb

## 2021-05-12 DIAGNOSIS — J9600 Acute respiratory failure, unspecified whether with hypoxia or hypercapnia: Secondary | ICD-10-CM | POA: Diagnosis not present

## 2021-05-12 DIAGNOSIS — I824Y9 Acute embolism and thrombosis of unspecified deep veins of unspecified proximal lower extremity: Secondary | ICD-10-CM

## 2021-05-12 DIAGNOSIS — G4733 Obstructive sleep apnea (adult) (pediatric): Secondary | ICD-10-CM | POA: Diagnosis not present

## 2021-05-12 DIAGNOSIS — R918 Other nonspecific abnormal finding of lung field: Secondary | ICD-10-CM | POA: Diagnosis not present

## 2021-05-12 DIAGNOSIS — Z9989 Dependence on other enabling machines and devices: Secondary | ICD-10-CM

## 2021-05-12 NOTE — Patient Instructions (Addendum)
It is good to see you today. You can stop wearing your oxygen with ambulation Wear your oxygen at bedtime We will give you a letter to return to work Sept. 19, 2022. Follow up CT Chest January 2023, with OV after to review results Continue Eliquis as you have been doing  We will call you to get this scheduled closer to the time.  Repeat Sleep Study as is scheduled Labor Day Weekend ( 9/4/20200), as is scheduled Follow up in 3 months with Maralyn Sago NP or Dr. Francine Graven Please contact office for sooner follow up if symptoms do not improve or worsen or seek emergency care

## 2021-05-12 NOTE — Progress Notes (Signed)
History of Present Illness Danny Norris is a 60 y.o. male remote smoker years ago  smoker with acute hypoxic respiratory failure 2/2 pneumonia requiring hospital admission (acute versus acute on chronic 02/19/21- 02/28/2021)  (ventilated x 4 days) , obesity, likely OSA and bilateral lower extremity DVT. He remains on his oxygen at 1.5 L , and remains somewhat deconditioned. On CT chest in the hospital, there was notation of a  left lower lobe nodule . He had follow up PET scan 7/ 21/2022, which showed resolved previously demonstrated subpleural density posteromedially in the left lower lobe has resolved, consistent with resolved atelectasis or inflammation. There was no evidence of thoracic malignancy. He is followed by Dr. Tonia BroomsIcard    05/12/2021 Pt. Presents for follow up. He states he has been doing well. He has had less shortness of breath. He walked off his oxygen and he never dropped his sats below 91% after walking on RA. He is anxious to start back to work. He states he has been able to be more active, and he feels he is back at his baseline. No fever, chest pain, orthopnea or hemoptysis.   Test Results: 04/22/2021 1. The previously demonstrated subpleural density posteromedially in the left lower lobe has resolved, consistent with resolved atelectasis or inflammation. No evidence of thoracic malignancy. 2. No suspicious hypermetabolic activity within the neck, chest, abdomen or pelvis. 3. Chronic right hemidiaphragm elevation and right basilar atelectasis. Cholelithiasis. Aortic Atherosclerosis (ICD10-I70.0).   -Respiratory panel PCR came back negative. -Tracheal aspirate did not grow any organisms.  Did not grow any organisms.   Chest x-ray on admission revealed: 1.  Elevation right hemidiaphragm of uncertain etiology.   2. Ill-defined airspace opacity in each right perihilar region. Question atypical organism pneumonia. Mild left base atelectasis.   3. Apparent bilateral hilar  adenopathy. Questionable left paratracheal adenopathy as well. This distribution of adenopathy may be seen with sarcoidosis. Neoplasm could present similarly. Chest CT advised to further evaluate for extent and distribution of apparent adenopathy.   4. Question thyroid enlargement given relative deviation toward the right of the lower cervical and upper thoracic trachea. CT again would be most helpful for potential thyroid mass/enlargement.  Graph echocardiogram revealed:   Echocardiogram revealed: 1. Left ventricular ejection fraction, by estimation, is 60 to 65%. The  left ventricle has normal function. The left ventricle has no regional  wall motion abnormalities. There is moderate left ventricular hypertrophy.  Left ventricular diastolic  parameters are indeterminate.   2. Right ventricular systolic function is normal. The right ventricular  size is normal. There is normal pulmonary artery systolic pressure.   3. Prolapse of the posterior MV leaflet with eccentric anterior MR  difficult to quantify. The mitral valve is abnormal. Mild to moderate  mitral valve regurgitation. No evidence of mitral stenosis.   4. The aortic valve is tricuspid. Aortic valve regurgitation is moderate.   5. Aortic dilatation noted. There is mild dilatation of the aortic root,  measuring 42 mm. There is mild dilatation of the ascending aorta,  measuring 41 mm.        CBC Latest Ref Rng & Units 02/27/2021 02/26/2021 02/25/2021  WBC 4.0 - 10.5 K/uL 8.6 8.4 9.5  Hemoglobin 13.0 - 17.0 g/dL 16.114.5 09.615.7 04.514.8  Hematocrit 39.0 - 52.0 % 46.1 49.4 46.6  Platelets 150 - 400 K/uL 207 176 186    BMP Latest Ref Rng & Units 02/27/2021 02/26/2021 02/25/2021  Glucose 70 - 99 mg/dL 95 93 409(W102(H)  BUN 6 - 20 mg/dL 12 14 14   Creatinine 0.61 - 1.24 mg/dL 9.32 6.71  BUN/Creat Ratio 9 - 20 - - -  Sodium 135 - 145 mmol/L 142 141 140  Potassium 3.5 - 5.1 mmol/L 3.9 4.1 4.5  Chloride 98 - 111 mmol/L 96(L) 102 98  CO2 22  - 32 mmol/L 38(H) 34(H) 33(H)  Calcium 8.9 - 10.3 mg/dL 2.45 80.9 9.8    BNP    Component Value Date/Time   BNP 206.9 (H) 02/19/2021 1033    ProBNP No results found for: PROBNP  PFT No results found for: FEV1PRE, FEV1POST, FVCPRE, FVCPOST, TLC, DLCOUNC, PREFEV1FVCRT, PSTFEV1FVCRT  NM PET Image Initial (PI) Skull Base To Thigh  Result Date: 04/23/2021 CLINICAL DATA:  Initial treatment strategy for left lower lobe pulmonary nodule. EXAM: NUCLEAR MEDICINE PET SKULL BASE TO THIGH TECHNIQUE: 11.6 mCi F-18 FDG was injected intravenously. Full-ring PET imaging was performed from the skull base to thigh after the radiotracer. CT data was obtained and used for attenuation correction and anatomic localization. Fasting blood glucose: 132 mg/dl COMPARISON:  CT neck and chest 03/08/2021 FINDINGS: Mediastinal blood pool activity: SUV max 2.6 NECK: No hypermetabolic cervical lymph nodes are identified.Symmetric activity in the lymphoid tissue of Waldeyer's ring, within physiologic limits. No suspicious activity within the pharyngeal mucosal space. Incidental CT findings: none CHEST: There are no hypermetabolic mediastinal, hilar or axillary lymph nodes. No hypermetabolic pulmonary activity or suspicious nodularity. The subpleural density previously noted posteromedially in the left lower lobe has resolved. No hypermetabolic activity in this area. Incidental CT findings: Chronic elevation of the right hemidiaphragm with associated right basilar atelectasis. Mild aortic atherosclerosis. ABDOMEN/PELVIS: There is no hypermetabolic activity within the liver, adrenal glands, spleen or pancreas. There is no hypermetabolic nodal activity. Incidental CT findings: Mild subjective hepatic steatosis. There are small calcified gallstones. Mild aortoiliac atherosclerosis. SKELETON: There is no hypermetabolic activity to suggest osseous metastatic disease. Incidental CT findings: Chronic deformities in the thoracic spine and  mild spondylosis. Stable sebaceous cyst in the midline upper back, without hypermetabolic activity. IMPRESSION: 1. The previously demonstrated subpleural density posteromedially in the left lower lobe has resolved, consistent with resolved atelectasis or inflammation. No evidence of thoracic malignancy. 2. No suspicious hypermetabolic activity within the neck, chest, abdomen or pelvis. 3. Chronic right hemidiaphragm elevation and right basilar atelectasis. Cholelithiasis. Aortic Atherosclerosis (ICD10-I70.0). Electronically Signed   By: 05/08/2021 M.D.   On: 04/23/2021 15:51   SLEEP STUDY DOCUMENTS  Result Date: 05/05/2021 Ordered by an unspecified provider.    Past medical hx Past Medical History:  Diagnosis Date   Dyslipidemia    HTN (hypertension)    Mitral valve regurgitation    a. 05/2011: TEE showing significant MVP especially involving the P2 segment without evidence for ruptured chordae and had eccentric MR anteriorly directed. b. 03/2015: echo showing EF 65-70% with posterior MV leaflet thickening and prolapse with mild MR.   Normal coronary arteries    a. normal cors by cath in 08/2011     Social History   Tobacco Use   Smoking status: Never   Smokeless tobacco: Never  Substance Use Topics   Alcohol use: No    Alcohol/week: 0.0 standard drinks    Mr.Bilyk reports that he has never smoked. He has never used smokeless tobacco. He reports that he does not drink alcohol.  Tobacco Cessation: Never smoker   Past surgical hx, Family hx, Social hx all reviewed.  Current Outpatient Medications on File Prior to  Visit  Medication Sig   apixaban (ELIQUIS) 5 MG TABS tablet Apixaban 10 mg po bid x 3 days, than 5 mg po bid   atorvastatin (LIPITOR) 20 MG tablet Take 1 tablet (20 mg total) by mouth daily at 6 PM.   docusate sodium (COLACE) 100 MG capsule Take 1 capsule (100 mg total) by mouth 2 (two) times daily.   feeding supplement (ENSURE ENLIVE / ENSURE PLUS) LIQD Take 237  mLs by mouth 2 (two) times daily between meals.   hydrochlorothiazide (HYDRODIURIL) 25 MG tablet TAKE 1 TABLET BY MOUTH EVERY DAY (Patient taking differently: Take 25 mg by mouth daily.)   metoprolol succinate (TOPROL-XL) 50 MG 24 hr tablet TAKE 1 TABLET BY MOUTH DAILY WITH OR IMMEDIATELY FOLLOWING A MEAL (Patient taking differently: Take 50 mg by mouth daily at 6 (six) AM.)   polyethylene glycol (MIRALAX / GLYCOLAX) 17 g packet Take 17 g by mouth daily.   No current facility-administered medications on file prior to visit.     Allergies  Allergen Reactions   Contrast Media [Iodinated Diagnostic Agents] Rash    Review Of Systems:  Constitutional:   No  weight loss, night sweats,  Fevers, chills, fatigue, or  lassitude.  HEENT:   No headaches,  Difficulty swallowing,  Tooth/dental problems, or  Sore throat,                No sneezing, itching, ear ache, nasal congestion, post nasal drip,   CV:  No chest pain,  Orthopnea, PND, swelling in lower extremities, anasarca, dizziness, palpitations, syncope.   GI  No heartburn, indigestion, abdominal pain, nausea, vomiting, diarrhea, change in bowel habits, loss of appetite, bloody stools.   Resp: No shortness of breath with exertion or at rest.  No excess mucus, no productive cough,  No non-productive cough,  No coughing up of blood.  No change in color of mucus.  No wheezing.  No chest wall deformity  Skin: no rash or lesions.  GU: no dysuria, change in color of urine, no urgency or frequency.  No flank pain, no hematuria   MS:  No joint pain or swelling.  No decreased range of motion.  No back pain.  Psych:  No change in mood or affect. No depression or anxiety.  No memory loss.   Vital Signs BP 138/88 (BP Location: Right Arm, Cuff Size: Normal)   Pulse (!) 101   Temp 97.8 F (36.6 C) (Oral)   Ht 6' (1.829 m)   Wt 237 lb 12.8 oz (107.9 kg)   SpO2 91%   BMI 32.25 kg/m    Physical Exam:  General- No distress,  A&O x 3,  pleasant ENT: No sinus tenderness, TM clear, pale nasal mucosa, no oral exudate,no post nasal drip, no LAN Cardiac: S1, S2, regular rate and rhythm, no murmur Chest: No wheeze/ rales/ dullness; no accessory muscle use, no nasal flaring, no sternal retractions Abd.: Soft Non-tender, ND, BS +, Body mass index is 32.25 kg/m.  Ext: No clubbing cyanosis, edema Neuro:  normal strength, MAE x 4, A&O x 3 Skin: No rashes, warm and dry, no lesions Psych: normal mood and behavior   Assessment/Plan Acute Respiratory Failure Slow to resolve Still requiring oxygen Plan Continue to wear oxygen at bedtime Pulmonary rehab.( Under Dr. Francine Graven for post hypoxic failure, deconditioning) should be reaching out to you to get you scheduled Continue to work on increasing your activity. Can return to work 06/21/2021   Pulmonary Nodule Noted on CT  Chest 01/2021 Previously demonstrated subpleural density posteromedially in the left lower lobe has resolved, consistent with resolved atelectasis or inflammation. No evidence of thoracic malignancy. Plan Follow up CT Chest without in 6 months ( 10/2020) Follow up OV to review results after CT   Suspected OSA Nocturnal desaturations Plan Sleep Study as scheduled  Will do follow up OV to review results Continue wearing oxygen every night as you have been doing.    DVT Plan Continue Eliquis as you have been doing.   I spent 35 minutes dedicated to the care of this patient on the date of this encounter to include pre-visit review of records, face-to-face time with the patient discussing conditions above, post visit ordering of testing, clinical documentation with the electronic health record, making appropriate referrals as documented, and communicating necessary information to the patient's healthcare team.    Bevelyn Ngo, NP 05/12/2021  4:38 PM

## 2021-05-17 NOTE — Procedures (Signed)
   NAME: Danny Norris DATE OF BIRTH:  1961/05/01 MEDICAL RECORD NUMBER [id number]  LOCATION: [facility]  PHYSICIAN: Deretha Emory  DATE OF STUDY: 05/02/2021  SLEEP STUDY TYPE: Nocturnal Polysomnogram               REFERRING PHYSICIAN: Deretha Emory, MD  EPWORTH SLEEPINESS SCORE:  NA HEIGHT: 6' (182.9 cm)  WEIGHT: 235 lb (106.6 kg)    Body mass index is 31.87 kg/m.  NECK SIZE: 17 in.  CLINICAL INFORMATION The patient was referred to the sleep center for Split night study. He has a history of OSA and had trouble using CPAP. He has had a recent bout of acute respiratory failure from which he is slowly improving.   MEDICATIONS No sleep medicine administered.Marland Kitchen  SLEEP STUDY TECHNIQUE A multi-channel overnight Polysomnography study was performed. The channels recorded and monitored were central and occipital EEG, electrooculogram (EOG), submentalis EMG (chin), nasal and oral airflow, thoracic and abdominal wall motion, anterior tibialis EMG, snore microphone, electrocardiogram, and a pulse oximetry.  TECHNICAL COMMENTS Comments added by Technician: None Comments added by Scorer: N/A  SLEEP ARCHITECTURE The study was initiated at 10:50:31 PM and terminated at 4:52:56 AM. The total recorded time was 362.4 minutes. EEG confirmed total sleep time was 250.5 minutes yielding a sleep efficiency of 69.1%%. Sleep onset after lights out was 8.3 minutes with a REM latency of 120.5 minutes. The patient spent 11.0%% of the night in stage N1 sleep, 73.1%% in stage N2 sleep, 0.0%% in stage N3 and 16% in REM. Wake after sleep onset (WASO) was 103.6 minutes. The Arousal Index was 14.9/hour.  RESPIRATORY PARAMETERS There were a total of 50 respiratory disturbances out of which 1 were apneas ( 1 obstructive, 0 mixed, 0 central) and 49 hypopneas. The apnea/hypopnea index (AHI) was 12.0 events/hour. The central sleep apnea index was 0 events/hour. The REM AHI was 7.5 events/hour  and NREM AHI was 12.8 events/hour. The supine AHI was N/A events/hour and the non supine AHI was 12 events per hour. He was supine during 0.00% of sleep. Respiratory disturbance index was 13.7 events/hour overall and 6 events/hour in REM sleep.  Respiratory disturbanceswere associated with oxygen desaturation down to a nadir of 80.0% during sleep. The mean oxygen saturation during the study was 89.8%. Oxygen saturation was below 89% for 80 minutes of sleep time.   LEG MOVEMENT DATA The total leg movements were 0 with a resulting leg movement index of 0.0/hr . Associated arousal with leg movement index was 0.0/hr.  CARDIAC DATA The underlying cardiac rhythm was most consistent with sinus rhythm. Mean heart rate during sleep was 51.8 bpm. Additional rhythm abnormalities include None.  IMPRESSIONS - Mild Obstructive Sleep Apnea (OSA) - Severe Oxygen Desaturation - The degree of hypoxemia was high for this degree of OSA. He may still have some lung injury from his acute respiratory failure.  - Sleep disordered breathing was not severe enough early enough in the night to allow for SPLIT study.   DIAGNOSIS - Obstructive Sleep Apnea (G47.33) - Nocturnal Hypoxemia (G47.36)  RECOMMENDATIONS - Therapeutic CPAP titration to determine optimal pressure required to alleviate sleep disordered breathing and hypoxemia. Continue nocturnal oxygen for now (hold during CPAP titration)  Deretha Emory Sleep specialist, American Board of Internal Medicine  ELECTRONICALLY SIGNED ON:  05/17/2021, 8:04 PM [facility] PH: [phone]   FX: [phone] ACCREDITED BY THE AMERICAN AC[employer] OF SLEEP MEDICINE

## 2021-05-21 ENCOUNTER — Other Ambulatory Visit: Payer: Self-pay

## 2021-05-21 ENCOUNTER — Encounter: Payer: BC Managed Care – PPO | Attending: Pulmonary Disease | Admitting: *Deleted

## 2021-05-21 DIAGNOSIS — J9601 Acute respiratory failure with hypoxia: Secondary | ICD-10-CM | POA: Insufficient documentation

## 2021-05-21 NOTE — Progress Notes (Signed)
Initial telephone orientation completed. Diagnosis can be found in [facility] 7/25. EP orientation scheduled for Tuesday 8/30 at 8 am.

## 2021-05-25 ENCOUNTER — Encounter (HOSPITAL_BASED_OUTPATIENT_CLINIC_OR_DEPARTMENT_OTHER): Payer: BC Managed Care – PPO | Admitting: Internal Medicine

## 2021-06-01 ENCOUNTER — Other Ambulatory Visit: Payer: Self-pay

## 2021-06-01 VITALS — Ht 72.0 in | Wt 238.6 lb

## 2021-06-01 DIAGNOSIS — J9601 Acute respiratory failure with hypoxia: Secondary | ICD-10-CM

## 2021-06-01 NOTE — Progress Notes (Signed)
Pulmonary Individual Treatment Plan  Patient Details  Name: Danny Norris MRN: [id number] Date of Birth: [birth year] Referring Provider:   Flowsheet Row Pulmonary Rehab from 06/01/2021 in [facility] Cardiac and Pulmonary Rehab  Referring Provider [name], MD       Initial Encounter Date:  Flowsheet Row Pulmonary Rehab from 06/01/2021 in [facility] Cardiac and Pulmonary Rehab  Date 06/01/21       Visit Diagnosis: Acute respiratory failure with hypoxia ([place])  Patient's Home Medications on Admission:  Current Outpatient Medications:    apixaban (ELIQUIS) 5 MG TABS tablet, Apixaban 10 mg po bid x 3 days, than 5 mg po bid, Disp: 66 tablet, Rfl: 1   atorvastatin (LIPITOR) 20 MG tablet, Take 1 tablet (20 mg total) by mouth daily at 6 PM., Disp: 30 tablet, Rfl: 6   docusate sodium (COLACE) 100 MG capsule, Take 1 capsule (100 mg total) by mouth 2 (two) times daily., Disp: 10 capsule, Rfl: 0   feeding supplement (ENSURE ENLIVE / ENSURE PLUS) LIQD, Take 237 mLs by mouth 2 (two) times daily between meals., Disp: 237 mL, Rfl: 12   hydrochlorothiazide (HYDRODIURIL) 25 MG tablet, TAKE 1 TABLET BY MOUTH EVERY DAY (Patient taking differently: Take 25 mg by mouth daily.), Disp: 90 tablet, Rfl: 1   metoprolol succinate (TOPROL-XL) 50 MG 24 hr tablet, TAKE 1 TABLET BY MOUTH DAILY WITH OR IMMEDIATELY FOLLOWING A MEAL (Patient taking differently: Take 50 mg by mouth daily at 6 (six) AM.), Disp: 90 tablet, Rfl: 1   polyethylene glycol (MIRALAX / GLYCOLAX) 17 g packet, Take 17 g by mouth daily., Disp: 14 each, Rfl: 0  Past Medical History: Past Medical History:  Diagnosis Date   Dyslipidemia    HTN (hypertension)    Mitral valve regurgitation    a. 05/2011: TEE showing significant MVP especially involving the P2 segment without evidence for ruptured chordae and had eccentric MR anteriorly directed. b. 03/2015: echo showing EF 65-70% with posterior MV leaflet thickening and prolapse with mild MR.   Normal  coronary arteries    a. normal cors by cath in 08/2011    Tobacco Use: Social History   Tobacco Use  Smoking Status Never  Smokeless Tobacco Never    Labs: Recent Review Flowsheet Data     Labs for ITP Cardiac and Pulmonary Rehab Latest Ref Rng & Units 08/08/2019 12/24/2019 02/19/2021 02/20/2021 02/21/2021   Cholestrol 100 - 199 mg/dL 167 132 - - -   LDLCALC 0 - 99 mg/dL 110(H) 79 - - -   HDL >39 mg/dL 40 37(L) - - -   Trlycerides <150 mg/dL 89 81 - 93 -   Hemoglobin A1c 4.8 - 5.6 % - - 6.3(H) - -   PHART 7.350 - 7.450 - - 7.383 7.576(H) 7.463(H)   PCO2ART 32.0 - 48.0 mmHg - - 57.5(H) 31.0(L) 41.2   HCO3 20.0 - 28.0 mmol/L - - 34.0(H) 28.8(H) 29.5(H)   TCO2 22 - 32 mmol/L - - 36(H) - 31   ACIDBASEDEF 0.0 - 2.0 mmol/L - - - - -   O2SAT % - - 99.0 99.2 92.0        Pulmonary Assessment Scores:  Pulmonary Assessment Scores     Row Name 06/01/21 0950         ADL UCSD   ADL Phase Entry     SOB Score total 18     Rest 0     Walk 2     Stairs 1  Bath 0     Dress 0     Shop 1           CAT Score   CAT Score 7           mMRC Score   mMRC Score 1              UCSD: Self-administered rating of dyspnea associated with activities of daily living (ADLs) 6-point scale (0 = "not at all" to 5 = "maximal or unable to do because of breathlessness")  Scoring Scores range from 0 to 120.  Minimally important difference is 5 units  CAT: CAT can identify the health impairment of COPD patients and is better correlated with disease progression.  CAT has a scoring range of zero to 40. The CAT score is classified into four groups of low (less than 10), medium (10 - 20), high (21-30) and very high (31-40) based on the impact level of disease on health status. A CAT score over 10 suggests significant symptoms.  A worsening CAT score could be explained by an exacerbation, poor medication adherence, poor inhaler technique, or progression of COPD or comorbid conditions.  CAT MCID is  2 points  mMRC: mMRC (Modified Medical Research Council) Dyspnea Scale is used to assess the degree of baseline functional disability in patients of respiratory disease due to dyspnea. No minimal important difference is established. A decrease in score of 1 point or greater is considered a positive change.   Pulmonary Function Assessment:   Exercise Target Goals: Exercise Program Goal: Individual exercise prescription set using results from initial 6 min walk test and THRR while considering  patient's activity barriers and safety.   Exercise Prescription Goal: Initial exercise prescription builds to 30-45 minutes a day of aerobic activity, 2-3 days per week.  Home exercise guidelines will be given to patient during program as part of exercise prescription that the participant will acknowledge.  Education: Aerobic Exercise: - Group verbal and visual presentation on the components of exercise prescription. Introduces F.I.T.T principle from ACSM for exercise prescriptions.  Reviews F.I.T.T. principles of aerobic exercise including progression. Written material given at graduation.   Education: Resistance Exercise: - Group verbal and visual presentation on the components of exercise prescription. Introduces F.I.T.T principle from ACSM for exercise prescriptions  Reviews F.I.T.T. principles of resistance exercise including progression. Written material given at graduation.    Education: Exercise & Equipment Safety: - Individual verbal instruction and demonstration of equipment use and safety with use of the equipment. Flowsheet Row Pulmonary Rehab from 06/01/2021 in [facility] Cardiac and Pulmonary Rehab  Education need identified 06/01/21  Date 06/01/21  Educator KL  Instruction Review Code 1- Verbalizes Understanding       Education: Exercise Physiology & General Exercise Guidelines: - Group verbal and written instruction with models to review the exercise physiology of the cardiovascular  system and associated critical values. Provides general exercise guidelines with specific guidelines to those with heart or lung disease.    Education: Flexibility, Balance, Mind/Body Relaxation: - Group verbal and visual presentation with interactive activity on the components of exercise prescription. Introduces F.I.T.T principle from ACSM for exercise prescriptions. Reviews F.I.T.T. principles of flexibility and balance exercise training including progression. Also discusses the mind body connection.  Reviews various relaxation techniques to help reduce and manage stress (i.e. Deep breathing, progressive muscle relaxation, and visualization). Balance handout provided to take home. Written material given at graduation.   Activity Barriers & Risk Stratification:  Activity Barriers & Cardiac  Risk Stratification - 06/01/21 0944       Activity Barriers & Cardiac Risk Stratification   Activity Barriers Shortness of Breath;Joint Problems;Deconditioning             6 Minute Walk:  6 Minute Walk     Row Name 06/01/21 0947         6 Minute Walk   Phase Initial     Distance 1385 feet     Walk Time 6 minutes     # of Rest Breaks 0     MPH 2.62     METS 3.73     RPE 13     Perceived Dyspnea  2     VO2 Peak 13.07     Symptoms Yes (comment)     Comments SOB     Resting HR 62 bpm     Resting BP 140/82     Resting Oxygen Saturation  96 %     Exercise Oxygen Saturation  during 6 min walk 91 %     Max Ex. HR 106 bpm     Max Ex. BP 166/84     2 Minute Post BP 144/84           Interval HR   1 Minute HR 79     2 Minute HR 96     3 Minute HR 101     4 Minute HR 103     5 Minute HR 106     6 Minute HR 105     2 Minute Post HR 67     Interval Heart Rate? Yes           Interval Oxygen   Interval Oxygen? Yes     Baseline Oxygen Saturation % 96 %     1 Minute Oxygen Saturation % 93 %     1 Minute Liters of Oxygen 2 L     2 Minute Oxygen Saturation % 92 %     2 Minute Liters of  Oxygen 2 L     3 Minute Oxygen Saturation % 93 %     3 Minute Liters of Oxygen 2 L     4 Minute Oxygen Saturation % 92 %     4 Minute Liters of Oxygen 2 L     5 Minute Oxygen Saturation % 91 %     5 Minute Liters of Oxygen 2 L     6 Minute Oxygen Saturation % 92 %     6 Minute Liters of Oxygen 2 L     2 Minute Post Oxygen Saturation % 94 %     2 Minute Post Liters of Oxygen 2 L             Oxygen Initial Assessment:  Oxygen Initial Assessment - 06/01/21 0949       Home Oxygen   Home Oxygen Device E-Tanks    Sleep Oxygen Prescription Continuous    Liters per minute 2    Home Exercise Oxygen Prescription None    Home Resting Oxygen Prescription None    Compliance with Home Oxygen Use Yes      Initial 6 min Walk   Oxygen Used Continuous    Liters per minute 2      Program Oxygen Prescription   Program Oxygen Prescription Continuous    Liters per minute 2      Intervention   Short Term Goals To learn and understand importance of monitoring SPO2 with pulse oximeter  and demonstrate accurate use of the pulse oximeter.;To learn and understand importance of maintaining oxygen saturations>88%;To learn and exhibit compliance with exercise, home and travel O2 prescription;To learn and demonstrate proper pursed lip breathing techniques or other breathing techniques. ;To learn and demonstrate proper use of respiratory medications    Long  Term Goals Exhibits compliance with exercise, home  and travel O2 prescription;Verbalizes importance of monitoring SPO2 with pulse oximeter and return demonstration;Maintenance of O2 saturations>88%;Compliance with respiratory medication;Exhibits proper breathing techniques, such as pursed lip breathing or other method taught during program session;Demonstrates proper use of MDI's             Oxygen Re-Evaluation:   Oxygen Discharge (Final Oxygen Re-Evaluation):   Initial Exercise Prescription:  Initial Exercise Prescription - 06/01/21 1000        Date of Initial Exercise RX and Referring Provider   Date 06/01/21    Referring Provider Fredonia Highland, MD      Oxygen   Oxygen Continuous    Liters 2    Maintain Oxygen Saturation 88% or higher      Treadmill   MPH 2.3    Grade 0.5    Minutes 15    METs 2.92      Recumbant Bike   Level 2    RPM 60    Watts 30    Minutes 15    METs 3.7      REL-XR   Level 2    Speed 50    Minutes 15    METs 3.7      Prescription Details   Frequency (times per week) 2    Duration Progress to 30 minutes of continuous aerobic without signs/symptoms of physical distress      Intensity   THRR 40-80% of Max Heartrate 101-141    Ratings of Perceived Exertion 11-13    Perceived Dyspnea 0-4      Progression   Progression Continue to progress workloads to maintain intensity without signs/symptoms of physical distress.      Resistance Training   Training Prescription Yes    Weight 4 lb    Reps 10-15             Perform Capillary Blood Glucose checks as needed.  Exercise Prescription Changes:   Exercise Prescription Changes     Row Name 06/01/21 1000             Response to Exercise   Blood Pressure (Admit) 140/82       Blood Pressure (Exercise) 166/84       Blood Pressure (Exit) 144/84       Heart Rate (Admit) 62 bpm       Heart Rate (Exercise) 106 bpm       Heart Rate (Exit) 67 bpm       Oxygen Saturation (Admit) 96 %       Oxygen Saturation (Exercise) 91 %       Oxygen Saturation (Exit) 94 %       Rating of Perceived Exertion (Exercise) 13       Perceived Dyspnea (Exercise) 2       Symptoms SOB       Comments walk test results                Exercise Comments:   Exercise Goals and Review:   Exercise Goals     Row Name 06/01/21 1055             Exercise Goals  Increase Physical Activity Yes       Intervention Provide advice, education, support and counseling about physical activity/exercise needs.;Develop an individualized exercise  prescription for aerobic and resistive training based on initial evaluation findings, risk stratification, comorbidities and participant's personal goals.       Expected Outcomes Short Term: Attend rehab on a regular basis to increase amount of physical activity.;Long Term: Add in home exercise to make exercise part of routine and to increase amount of physical activity.;Long Term: Exercising regularly at least 3-5 days a week.       Increase Strength and Stamina Yes       Intervention Provide advice, education, support and counseling about physical activity/exercise needs.;Develop an individualized exercise prescription for aerobic and resistive training based on initial evaluation findings, risk stratification, comorbidities and participant's personal goals.       Expected Outcomes Short Term: Increase workloads from initial exercise prescription for resistance, speed, and METs.;Short Term: Perform resistance training exercises routinely during rehab and add in resistance training at home;Long Term: Improve cardiorespiratory fitness, muscular endurance and strength as measured by increased METs and functional capacity ( )       Able to understand and use rate of perceived exertion (RPE) scale Yes       Intervention Provide education and explanation on how to use RPE scale       Expected Outcomes Short Term: Able to use RPE daily in rehab to express subjective intensity level;Long Term:  Able to use RPE to guide intensity level when exercising independently       Able to understand and use Dyspnea scale Yes       Intervention Provide education and explanation on how to use Dyspnea scale       Expected Outcomes Short Term: Able to use Dyspnea scale daily in rehab to express subjective sense of shortness of breath during exertion;Long Term: Able to use Dyspnea scale to guide intensity level when exercising independently       Knowledge and understanding of Target Heart Rate Range (THRR) Yes        Intervention Provide education and explanation of THRR including how the numbers were predicted and where they are located for reference       Expected Outcomes Short Term: Able to state/look up THRR;Long Term: Able to use THRR to govern intensity when exercising independently;Short Term: Able to use daily as guideline for intensity in rehab       Able to check pulse independently Yes       Intervention Provide education and demonstration on how to check pulse in carotid and radial arteries.;Review the importance of being able to check your own pulse for safety during independent exercise       Expected Outcomes Short Term: Able to explain why pulse checking is important during independent exercise;Long Term: Able to check pulse independently and accurately       Understanding of Exercise Prescription Yes       Intervention Provide education, explanation, and written materials on patient's individual exercise prescription       Expected Outcomes Short Term: Able to explain program exercise prescription;Long Term: Able to explain home exercise prescription to exercise independently                Exercise Goals Re-Evaluation :   Discharge Exercise Prescription (Final Exercise Prescription Changes):  Exercise Prescription Changes - 06/01/21 1000       Response to Exercise   Blood Pressure (Admit) 140/82  Blood Pressure (Exercise) 166/84    Blood Pressure (Exit) 144/84    Heart Rate (Admit) 62 bpm    Heart Rate (Exercise) 106 bpm    Heart Rate (Exit) 67 bpm    Oxygen Saturation (Admit) 96 %    Oxygen Saturation (Exercise) 91 %    Oxygen Saturation (Exit) 94 %    Rating of Perceived Exertion (Exercise) 13    Perceived Dyspnea (Exercise) 2    Symptoms SOB    Comments walk test results             Nutrition:  Target Goals: Understanding of nutrition guidelines, daily intake of sodium 1500mg , cholesterol 200mg , calories 30% from fat and 7% or less from saturated fats, daily  to have 5 or more servings of fruits and vegetables.  Education: All About Nutrition: -Group instruction provided by verbal, written material, interactive activities, discussions, models, and posters to present general guidelines for heart healthy nutrition including fat, fiber, MyPlate, the role of sodium in heart healthy nutrition, utilization of the nutrition label, and utilization of this knowledge for meal planning. Follow up email sent as well. Written material given at graduation. Flowsheet Row Pulmonary Rehab from 06/01/2021 in [facility] Cardiac and Pulmonary Rehab  Education need identified 06/01/21       Biometrics:  Pre Biometrics - 06/01/21 0944       Pre Biometrics   Height 6' (1.829 m)    Weight 238 lb 9.6 oz (108.2 kg)    BMI (Calculated) 32.35    Single Leg Stand 3.93 seconds              Nutrition Therapy Plan and Nutrition Goals:  Nutrition Therapy & Goals - 06/01/21 0955       Intervention Plan   Intervention Prescribe, educate and counsel regarding individualized specific dietary modifications aiming towards targeted core components such as weight, hypertension, lipid management, diabetes, heart failure and other comorbidities.    Expected Outcomes Short Term Goal: Understand basic principles of dietary content, such as calories, fat, sodium, cholesterol and nutrients.;Short Term Goal: A plan has been developed with personal nutrition goals set during dietitian appointment.;Long Term Goal: Adherence to prescribed nutrition plan.             Nutrition Assessments:  MEDIFICTS Score Key: ?70 Need to make dietary changes  40-70 Heart Healthy Diet ? 40 Therapeutic Level Cholesterol Diet  Flowsheet Row Pulmonary Rehab from 06/01/2021 in [facility] Cardiac and Pulmonary Rehab  Picture Your Plate Total Score on Admission 49      Picture Your Plate Scores: <56 Unhealthy dietary pattern with much room for improvement. 41-50 Dietary pattern unlikely to meet  recommendations for good health and room for improvement. 51-60 More healthful dietary pattern, with some room for improvement.  >60 Healthy dietary pattern, although there may be some specific behaviors that could be improved.   Nutrition Goals Re-Evaluation:   Nutrition Goals Discharge (Final Nutrition Goals Re-Evaluation):   Psychosocial: Target Goals: Acknowledge presence or absence of significant depression and/or stress, maximize coping skills, provide positive support system. Participant is able to verbalize types and ability to use techniques and skills needed for reducing stress and depression.   Education: Stress, Anxiety, and Depression - Group verbal and visual presentation to define topics covered.  Reviews how body is impacted by stress, anxiety, and depression.  Also discusses healthy ways to reduce stress and to treat/manage anxiety and depression.  Written material given at graduation.   Education: Sleep Hygiene -Provides group  verbal and written instruction about how sleep can affect your health.  Define sleep hygiene, discuss sleep cycles and impact of sleep habits. Review good sleep hygiene tips.    Initial Review & Psychosocial Screening:  Initial Psych Review & Screening - 05/21/21 1509       Initial Review   Current issues with Current Sleep Concerns      Family Dynamics   Good Support System? Yes      Barriers   Psychosocial barriers to participate in program There are no identifiable barriers or psychosocial needs.;The patient should benefit from training in stress management and relaxation.      Screening Interventions   Interventions Encouraged to exercise;Provide feedback about the scores to participant;To provide support and resources with identified psychosocial needs    Expected Outcomes Short Term goal: Utilizing psychosocial counselor, staff and physician to assist with identification of specific Stressors or current issues interfering with healing  process. Setting desired goal for each stressor or current issue identified.;Long Term Goal: Stressors or current issues are controlled or eliminated.;Short Term goal: Identification and review with participant of any Quality of Life or Depression concerns found by scoring the questionnaire.;Long Term goal: The participant improves quality of Life and PHQ9 Scores as seen by post scores and/or verbalization of changes             Quality of Life Scores:  Scores of 19 and below usually indicate a poorer quality of life in these areas.  A difference of  2-3 points is a clinically meaningful difference.  A difference of 2-3 points in the total score of the Quality of Life Index has been associated with significant improvement in overall quality of life, self-image, physical symptoms, and general health in studies assessing change in quality of life.  PHQ-9: Recent Review Flowsheet Data     Depression screen [facility] 2/9 06/01/2021   Decreased Interest 0   Down, Depressed, Hopeless 1   PHQ - 2 Score 1   Altered sleeping 2   Tired, decreased energy 1   Change in appetite 1   Feeling bad or failure about yourself  1   Trouble concentrating 1   Moving slowly or fidgety/restless 0   Suicidal thoughts 0   PHQ-9 Score 7   Difficult doing work/chores Somewhat difficult      Interpretation of Total Score  Total Score Depression Severity:  1-4 = Minimal depression, 5-9 = Mild depression, 10-14 = Moderate depression, 15-19 = Moderately severe depression, 20-27 = Severe depression   Psychosocial Evaluation and Intervention:  Psychosocial Evaluation - 05/21/21 1526       Psychosocial Evaluation & Interventions   Interventions Encouraged to exercise with the program and follow exercise prescription    Comments Mr. Brightbill breathing has been a concern for him for a while. Thankfully he has been able to wean himself off his oxygen during the day. Currently, he requires 2L at night but is getting a  sleep study soon to determine if he needs CPAP. His sister in law is involved in his care. He plans on going back to work Sept 19 so he isn't sure what his schedule will look like.    Expected Outcomes Short: attend pulmonary rehab for education and exercise. Long: develop and maintain positive self care habits.    Continue Psychosocial Services  Follow up required by staff             Psychosocial Re-Evaluation:   Psychosocial Discharge (Final Psychosocial Re-Evaluation):  Education: Education Goals: Education classes will be provided on a weekly basis, covering required topics. Participant will state understanding/return demonstration of topics presented.  Learning Barriers/Preferences:   General Pulmonary Education Topics:  Infection Prevention: - Provides verbal and written material to individual with discussion of infection control including proper hand washing and proper equipment cleaning during exercise session. Flowsheet Row Pulmonary Rehab from 06/01/2021 in [facility] Cardiac and Pulmonary Rehab  Education need identified 06/01/21  Date 06/01/21  Educator KL  Instruction Review Code 1- Verbalizes Understanding       Falls Prevention: - Provides verbal and written material to individual with discussion of falls prevention and safety. Flowsheet Row Pulmonary Rehab from 06/01/2021 in [facility] Cardiac and Pulmonary Rehab  Education need identified 06/01/21  Date 06/01/21  Educator KL  Instruction Review Code 1- Verbalizes Understanding       Chronic Lung Disease Review: - Group verbal instruction with posters, models, PowerPoint presentations and videos,  to review new updates, new respiratory medications, new advancements in procedures and treatments. Providing information on websites and "800" numbers for continued self-education. Includes information about supplement oxygen, available portable oxygen systems, continuous and intermittent flow rates, oxygen safety,  concentrators, and Medicare reimbursement for oxygen. Explanation of Pulmonary Drugs, including class, frequency, complications, importance of spacers, rinsing mouth after steroid MDI's, and proper cleaning methods for nebulizers. Review of basic lung anatomy and physiology related to function, structure, and complications of lung disease. Review of risk factors. Discussion about methods for diagnosing sleep apnea and types of masks and machines for OSA. Includes a review of the use of types of environmental controls: home humidity, furnaces, filters, dust mite/pet prevention, HEPA vacuums. Discussion about weather changes, air quality and the benefits of nasal washing. Instruction on Warning signs, infection symptoms, calling MD promptly, preventive modes, and value of vaccinations. Review of effective airway clearance, coughing and/or vibration techniques. Emphasizing that all should Create an Action Plan. Written material given at graduation. Flowsheet Row Pulmonary Rehab from 06/01/2021 in [facility] Cardiac and Pulmonary Rehab  Education need identified 06/01/21       AED/CPR: - Group verbal and written instruction with the use of models to demonstrate the basic use of the AED with the basic ABC's of resuscitation.    Anatomy and Cardiac Procedures: - Group verbal and visual presentation and models provide information about basic cardiac anatomy and function. Reviews the testing methods done to diagnose heart disease and the outcomes of the test results. Describes the treatment choices: Medical Management, Angioplasty, or Coronary Bypass Surgery for treating various heart conditions including Myocardial Infarction, Angina, Valve Disease, and Cardiac Arrhythmias.  Written material given at graduation.   Medication Safety: - Group verbal and visual instruction to review commonly prescribed medications for heart and lung disease. Reviews the medication, class of the drug, and side effects. Includes the  steps to properly store meds and maintain the prescription regimen.  Written material given at graduation.   Other: -Provides group and verbal instruction on various topics (see comments)   Knowledge Questionnaire Score:  Knowledge Questionnaire Score - 06/01/21 0955       Knowledge Questionnaire Score   Pre Score 13/18: ADL, O2, PLB              Core Components/Risk Factors/Patient Goals at Admission:  Personal Goals and Risk Factors at Admission - 06/01/21 1056       Core Components/Risk Factors/Patient Goals on Admission    Weight Management Yes;Weight Loss    Intervention Weight  Management: Develop a combined nutrition and exercise program designed to reach desired caloric intake, while maintaining appropriate intake of nutrient and fiber, sodium and fats, and appropriate energy expenditure required for the weight goal.;Weight Management: Provide education and appropriate resources to help participant work on and attain dietary goals.;Weight Management/Obesity: Establish reasonable short term and long term weight goals.    Admit Weight 238 lb (108 kg)    Goal Weight: Short Term 233 lb (105.7 kg)    Goal Weight: Long Term 220 lb (99.8 kg)    Expected Outcomes Short Term: Continue to assess and modify interventions until short term weight is achieved;Long Term: Adherence to nutrition and physical activity/exercise program aimed toward attainment of established weight goal;Weight Loss: Understanding of general recommendations for a balanced deficit meal plan, which promotes 1-2 lb weight loss per week and includes a negative energy balance of [phone] kcal/d;Understanding recommendations for meals to include 15-35% energy as protein, 25-35% energy from fat, 35-60% energy from carbohydrates, less than  of dietary cholesterol, 20-35 gm of total fiber daily;Understanding of distribution of calorie intake throughout the day with the consumption of 4-5 meals/snacks    Improve  shortness of breath with ADL's Yes    Intervention Provide education, individualized exercise plan and daily activity instruction to help decrease symptoms of SOB with activities of daily living.    Expected Outcomes Short Term: Improve cardiorespiratory fitness to achieve a reduction of symptoms when performing ADLs;Long Term: Be able to perform more ADLs without symptoms or delay the onset of symptoms    Hypertension Yes    Intervention Monitor prescription use compliance.;Provide education on lifestyle modifcations including regular physical activity/exercise, weight management, moderate sodium restriction and increased consumption of fresh fruit, vegetables, and low fat dairy, alcohol moderation, and smoking cessation.    Expected Outcomes Short Term: Continued assessment and intervention until BP is < 140/68mm HG in hypertensive participants. < 130/80mm HG in hypertensive participants with diabetes, heart failure or chronic kidney disease.;Long Term: Maintenance of blood pressure at goal levels.    Lipids Yes    Intervention Provide education and support for participant on nutrition & aerobic/resistive exercise along with prescribed medications to achieve LDL 70mg , HDL >40mg .    Expected Outcomes Short Term: Participant states understanding of desired cholesterol values and is compliant with medications prescribed. Participant is following exercise prescription and nutrition guidelines.;Long Term: Cholesterol controlled with medications as prescribed, with individualized exercise RX and with personalized nutrition plan. Value goals: LDL < , HDL > 40 mg.             Education:Diabetes - Individual verbal and written instruction to review signs/symptoms of diabetes, desired ranges of glucose level fasting, after meals and with exercise. Acknowledge that pre and post exercise glucose checks will be done for 3 sessions at entry of program.   Know Your Numbers and Heart Failure: - Group  verbal and visual instruction to discuss disease risk factors for cardiac and pulmonary disease and treatment options.  Reviews associated critical values for Overweight/Obesity, Hypertension, Cholesterol, and Diabetes.  Discusses basics of heart failure: signs/symptoms and treatments.  Introduces Heart Failure Zone chart for action plan for heart failure.  Written material given at graduation.   Core Components/Risk Factors/Patient Goals Review:    Core Components/Risk Factors/Patient Goals at Discharge (Final Review):    ITP Comments:  ITP Comments     Row Name 05/21/21 1520 06/01/21 0940         ITP Comments Initial telephone orientation completed.  [facility] EP orientation scheduled for Tuesday 8/30 at 8 am. Completed 6MWT and gym orientation. Initial ITP created and sent for review to Dr. Vida RiggerFuad Aleskerov, Medical Director.               Comments: Initial ITP

## 2021-06-01 NOTE — Patient Instructions (Signed)
Patient Instructions  Patient Details  Name: Danny Norris MRN: [id number] Date of Birth: [birth year] Referring Provider:  Martina Sinner, MD  Below are your personal goals for exercise, nutrition, and risk factors. Our goal is to help you stay on track towards obtaining and maintaining these goals. We will be discussing your progress on these goals with you throughout the program.  Initial Exercise Prescription:  Initial Exercise Prescription - 06/01/21 1000       Date of Initial Exercise RX and Referring Provider   Date 06/01/21    Referring Provider Fredonia Highland, MD      Oxygen   Oxygen Continuous    Liters 2    Maintain Oxygen Saturation 88% or higher      Treadmill   MPH 2.3    Grade 0.5    Minutes 15    METs 2.92      Recumbant Bike   Level 2    RPM 60    Watts 30    Minutes 15    METs 3.7      REL-XR   Level 2    Speed 50    Minutes 15    METs 3.7      Prescription Details   Frequency (times per week) 2    Duration Progress to 30 minutes of continuous aerobic without signs/symptoms of physical distress      Intensity   THRR 40-80% of Max Heartrate 101-141    Ratings of Perceived Exertion 11-13    Perceived Dyspnea 0-4      Progression   Progression Continue to progress workloads to maintain intensity without signs/symptoms of physical distress.      Resistance Training   Training Prescription Yes    Weight 4 lb    Reps 10-15             Exercise Goals: Frequency: Be able to perform aerobic exercise two to three times per week in program working toward 2-5 days per week of home exercise.  Intensity: Work with a perceived exertion of 11 (fairly light) - 15 (hard) while following your exercise prescription.  We will make changes to your prescription with you as you progress through the program.   Duration: Be able to do 30 to 45 minutes of continuous aerobic exercise in addition to a 5 minute warm-up and a 5 minute cool-down routine.    Nutrition Goals: Your personal nutrition goals will be established when you do your nutrition analysis with the dietician.  The following are general nutrition guidelines to follow: Cholesterol < 200mg /day Sodium < 1500mg /day Fiber: Men over 50 yrs - 30 grams per day  Personal Goals:  Personal Goals and Risk Factors at Admission - 06/01/21 1056       Core Components/Risk Factors/Patient Goals on Admission    Weight Management Yes;Weight Loss    Intervention Weight Management: Develop a combined nutrition and exercise program designed to reach desired caloric intake, while maintaining appropriate intake of nutrient and fiber, sodium and fats, and appropriate energy expenditure required for the weight goal.;Weight Management: Provide education and appropriate resources to help participant work on and attain dietary goals.;Weight Management/Obesity: Establish reasonable short term and long term weight goals.    Admit Weight 238 lb (108 kg)    Goal Weight: Short Term 233 lb (105.7 kg)    Goal Weight: Long Term 220 lb (99.8 kg)    Expected Outcomes Short Term: Continue to assess and modify interventions until short term weight  is achieved;Long Term: Adherence to nutrition and physical activity/exercise program aimed toward attainment of established weight goal;Weight Loss: Understanding of general recommendations for a balanced deficit meal plan, which promotes 1-2 lb weight loss per week and includes a negative energy balance of [phone] kcal/d;Understanding recommendations for meals to include 15-35% energy as protein, 25-35% energy from fat, 35-60% energy from carbohydrates, less than 200mg  of dietary cholesterol, 20-35 gm of total fiber daily;Understanding of distribution of calorie intake throughout the day with the consumption of 4-5 meals/snacks    Improve shortness of breath with ADL's Yes    Intervention Provide education, individualized exercise plan and daily activity instruction to help  decrease symptoms of SOB with activities of daily living.    Expected Outcomes Short Term: Improve cardiorespiratory fitness to achieve a reduction of symptoms when performing ADLs;Long Term: Be able to perform more ADLs without symptoms or delay the onset of symptoms    Hypertension Yes    Intervention Monitor prescription use compliance.;Provide education on lifestyle modifcations including regular physical activity/exercise, weight management, moderate sodium restriction and increased consumption of fresh fruit, vegetables, and low fat dairy, alcohol moderation, and smoking cessation.    Expected Outcomes Short Term: Continued assessment and intervention until BP is < 140/37mm HG in hypertensive participants. < 130/20mm HG in hypertensive participants with diabetes, heart failure or chronic kidney disease.;Long Term: Maintenance of blood pressure at goal levels.    Lipids Yes    Intervention Provide education and support for participant on nutrition & aerobic/resistive exercise along with prescribed medications to achieve LDL 70mg , HDL >40mg .    Expected Outcomes Short Term: Participant states understanding of desired cholesterol values and is compliant with medications prescribed. Participant is following exercise prescription and nutrition guidelines.;Long Term: Cholesterol controlled with medications as prescribed, with individualized exercise RX and with personalized nutrition plan. Value goals: LDL < 70mg , HDL > 40 mg.             Tobacco Use Initial Evaluation: Social History   Tobacco Use  Smoking Status Never  Smokeless Tobacco Never    Exercise Goals and Review:  Exercise Goals     Row Name 06/01/21 1055             Exercise Goals   Increase Physical Activity Yes       Intervention Provide advice, education, support and counseling about physical activity/exercise needs.;Develop an individualized exercise prescription for aerobic and resistive training based on initial  evaluation findings, risk stratification, comorbidities and participant's personal goals.       Expected Outcomes Short Term: Attend rehab on a regular basis to increase amount of physical activity.;Long Term: Add in home exercise to make exercise part of routine and to increase amount of physical activity.;Long Term: Exercising regularly at least 3-5 days a week.       Increase Strength and Stamina Yes       Intervention Provide advice, education, support and counseling about physical activity/exercise needs.;Develop an individualized exercise prescription for aerobic and resistive training based on initial evaluation findings, risk stratification, comorbidities and participant's personal goals.       Expected Outcomes Short Term: Increase workloads from initial exercise prescription for resistance, speed, and METs.;Short Term: Perform resistance training exercises routinely during rehab and add in resistance training at home;Long Term: Improve cardiorespiratory fitness, muscular endurance and strength as measured by increased METs and functional capacity ( )       Able to understand and use rate of perceived exertion (RPE) scale Yes  Intervention Provide education and explanation on how to use RPE scale       Expected Outcomes Short Term: Able to use RPE daily in rehab to express subjective intensity level;Long Term:  Able to use RPE to guide intensity level when exercising independently       Able to understand and use Dyspnea scale Yes       Intervention Provide education and explanation on how to use Dyspnea scale       Expected Outcomes Short Term: Able to use Dyspnea scale daily in rehab to express subjective sense of shortness of breath during exertion;Long Term: Able to use Dyspnea scale to guide intensity level when exercising independently       Knowledge and understanding of Target Heart Rate Range (THRR) Yes       Intervention Provide education and explanation of THRR including how  the numbers were predicted and where they are located for reference       Expected Outcomes Short Term: Able to state/look up THRR;Long Term: Able to use THRR to govern intensity when exercising independently;Short Term: Able to use daily as guideline for intensity in rehab       Able to check pulse independently Yes       Intervention Provide education and demonstration on how to check pulse in carotid and radial arteries.;Review the importance of being able to check your own pulse for safety during independent exercise       Expected Outcomes Short Term: Able to explain why pulse checking is important during independent exercise;Long Term: Able to check pulse independently and accurately       Understanding of Exercise Prescription Yes       Intervention Provide education, explanation, and written materials on patient's individual exercise prescription       Expected Outcomes Short Term: Able to explain program exercise prescription;Long Term: Able to explain home exercise prescription to exercise independently                Copy of goals given to participant.

## 2021-06-06 ENCOUNTER — Other Ambulatory Visit: Payer: Self-pay

## 2021-06-06 ENCOUNTER — Ambulatory Visit (HOSPITAL_BASED_OUTPATIENT_CLINIC_OR_DEPARTMENT_OTHER): Payer: BC Managed Care – PPO | Attending: Internal Medicine | Admitting: Internal Medicine

## 2021-06-06 DIAGNOSIS — G4733 Obstructive sleep apnea (adult) (pediatric): Secondary | ICD-10-CM | POA: Insufficient documentation

## 2021-06-06 DIAGNOSIS — G4761 Periodic limb movement disorder: Secondary | ICD-10-CM | POA: Insufficient documentation

## 2021-06-08 ENCOUNTER — Encounter: Payer: BC Managed Care – PPO | Attending: Pulmonary Disease

## 2021-06-08 ENCOUNTER — Other Ambulatory Visit: Payer: Self-pay

## 2021-06-08 DIAGNOSIS — J9601 Acute respiratory failure with hypoxia: Secondary | ICD-10-CM | POA: Diagnosis not present

## 2021-06-08 NOTE — Progress Notes (Signed)
Daily Session Note  Patient Details  Name: Danny Norris MRN: [id number] Date of Birth: Feb 05, 1961 Referring Provider:   Flowsheet Row Pulmonary Rehab from 06/01/2021 in [facility] Cardiac and Pulmonary Rehab  Referring Provider [name], MD       Encounter Date: 06/08/2021  Check In:  Session Check In - 06/08/21 0931       Check-In   Supervising physician immediately available to respond to emergencies See telemetry face sheet for immediately available ER MD    Location [facility]-Cardiac & Pulmonary Rehab    Staff Present Birdie Sons, MPA, Elveria Rising, BA, ACSM CEP, Exercise Physiologist;Laureen Owens Shark, BS, RRT, CPFT    Virtual Visit No    Medication changes reported     No    Fall or balance concerns reported    No    Warm-up and Cool-down Performed on first and last piece of equipment    Resistance Training Performed Yes    VAD Patient? No    PAD/SET Patient? No      Pain Assessment   Currently in Pain? No/denies                Social History   Tobacco Use  Smoking Status Never  Smokeless Tobacco Never    Goals Met:  Independence with exercise equipment Exercise tolerated well No report of concerns or symptoms today Strength training completed today  Goals Unmet:  Not Applicable  Comments: First full day of exercise!  Patient was oriented to gym and equipment including functions, settings, policies, and procedures.  Patient's individual exercise prescription and treatment plan were reviewed.  All starting workloads were established based on the results of the 6 minute walk test done at initial orientation visit.  The plan for exercise progression was also introduced and progression will be customized based on patient's performance and goals.    Dr. Emily Filbert is Medical Director for [facility].  Dr. Ottie Glazier is Medical Director for [facility] Pulmonary Rehabilitation.

## 2021-06-09 ENCOUNTER — Encounter: Payer: Self-pay | Admitting: *Deleted

## 2021-06-09 DIAGNOSIS — J9601 Acute respiratory failure with hypoxia: Secondary | ICD-10-CM

## 2021-06-09 NOTE — Progress Notes (Signed)
Pulmonary Individual Treatment Plan  Patient Details  Name: Danny Norris MRN: [id number] Date of Birth: [birth year] Referring Provider:   Flowsheet Row Pulmonary Rehab from 06/01/2021 in [facility] Cardiac and Pulmonary Rehab  Referring Provider [name], MD       Initial Encounter Date:  Flowsheet Row Pulmonary Rehab from 06/01/2021 in [facility] Cardiac and Pulmonary Rehab  Date 06/01/21       Visit Diagnosis: Acute respiratory failure with hypoxia ([place])  Patient's Home Medications on Admission:  Current Outpatient Medications:    apixaban (ELIQUIS) 5 MG TABS tablet, Apixaban 10 mg po bid x 3 days, than 5 mg po bid, Disp: 66 tablet, Rfl: 1   atorvastatin (LIPITOR) 20 MG tablet, Take 1 tablet (20 mg total) by mouth daily at 6 PM., Disp: 30 tablet, Rfl: 6   docusate sodium (COLACE) 100 MG capsule, Take 1 capsule (100 mg total) by mouth 2 (two) times daily., Disp: 10 capsule, Rfl: 0   feeding supplement (ENSURE ENLIVE / ENSURE PLUS) LIQD, Take 237 mLs by mouth 2 (two) times daily between meals., Disp: 237 mL, Rfl: 12   hydrochlorothiazide (HYDRODIURIL) 25 MG tablet, TAKE 1 TABLET BY MOUTH EVERY DAY (Patient taking differently: Take 25 mg by mouth daily.), Disp: 90 tablet, Rfl: 1   metoprolol succinate (TOPROL-XL) 50 MG 24 hr tablet, TAKE 1 TABLET BY MOUTH DAILY WITH OR IMMEDIATELY FOLLOWING A MEAL (Patient taking differently: Take 50 mg by mouth daily at 6 (six) AM.), Disp: 90 tablet, Rfl: 1   polyethylene glycol (MIRALAX / GLYCOLAX) 17 g packet, Take 17 g by mouth daily., Disp: 14 each, Rfl: 0  Past Medical History: Past Medical History:  Diagnosis Date   Dyslipidemia    HTN (hypertension)    Mitral valve regurgitation    a. 05/2011: TEE showing significant MVP especially involving the P2 segment without evidence for ruptured chordae and had eccentric MR anteriorly directed. b. 03/2015: echo showing EF 65-70% with posterior MV leaflet thickening and prolapse with mild MR.   Normal  coronary arteries    a. normal cors by cath in 08/2011    Tobacco Use: Social History   Tobacco Use  Smoking Status Never  Smokeless Tobacco Never    Labs: Recent Review Flowsheet Data     Labs for ITP Cardiac and Pulmonary Rehab Latest Ref Rng & Units 08/08/2019 12/24/2019 02/19/2021 02/20/2021 02/21/2021   Cholestrol 100 - 199 mg/dL 167 132 - - -   LDLCALC 0 - 99 mg/dL 110(H) 79 - - -   HDL >39 mg/dL 40 37(L) - - -   Trlycerides <150 mg/dL 89 81 - 93 -   Hemoglobin A1c 4.8 - 5.6 % - - 6.3(H) - -   PHART 7.350 - 7.450 - - 7.383 7.576(H) 7.463(H)   PCO2ART 32.0 - 48.0 mmHg - - 57.5(H) 31.0(L) 41.2   HCO3 20.0 - 28.0 mmol/L - - 34.0(H) 28.8(H) 29.5(H)   TCO2 22 - 32 mmol/L - - 36(H) - 31   ACIDBASEDEF 0.0 - 2.0 mmol/L - - - - -   O2SAT % - - 99.0 99.2 92.0        Pulmonary Assessment Scores:  Pulmonary Assessment Scores     Row Name 06/01/21 0950         ADL UCSD   ADL Phase Entry     SOB Score total 18     Rest 0     Walk 2     Stairs 1  Bath 0     Dress 0     Shop 1           CAT Score   CAT Score 7           mMRC Score   mMRC Score 1              UCSD: Self-administered rating of dyspnea associated with activities of daily living (ADLs) 6-point scale (0 = "not at all" to 5 = "maximal or unable to do because of breathlessness")  Scoring Scores range from 0 to 120.  Minimally important difference is 5 units  CAT: CAT can identify the health impairment of COPD patients and is better correlated with disease progression.  CAT has a scoring range of zero to 40. The CAT score is classified into four groups of low (less than 10), medium (10 - 20), high (21-30) and very high (31-40) based on the impact level of disease on health status. A CAT score over 10 suggests significant symptoms.  A worsening CAT score could be explained by an exacerbation, poor medication adherence, poor inhaler technique, or progression of COPD or comorbid conditions.  CAT MCID is  2 points  mMRC: mMRC (Modified Medical Research Council) Dyspnea Scale is used to assess the degree of baseline functional disability in patients of respiratory disease due to dyspnea. No minimal important difference is established. A decrease in score of 1 point or greater is considered a positive change.   Pulmonary Function Assessment:   Exercise Target Goals: Exercise Program Goal: Individual exercise prescription set using results from initial 6 min walk test and THRR while considering  patient's activity barriers and safety.   Exercise Prescription Goal: Initial exercise prescription builds to 30-45 minutes a day of aerobic activity, 2-3 days per week.  Home exercise guidelines will be given to patient during program as part of exercise prescription that the participant will acknowledge.  Education: Aerobic Exercise: - Group verbal and visual presentation on the components of exercise prescription. Introduces F.I.T.T principle from ACSM for exercise prescriptions.  Reviews F.I.T.T. principles of aerobic exercise including progression. Written material given at graduation.   Education: Resistance Exercise: - Group verbal and visual presentation on the components of exercise prescription. Introduces F.I.T.T principle from ACSM for exercise prescriptions  Reviews F.I.T.T. principles of resistance exercise including progression. Written material given at graduation.    Education: Exercise & Equipment Safety: - Individual verbal instruction and demonstration of equipment use and safety with use of the equipment. Flowsheet Row Pulmonary Rehab from 06/01/2021 in [facility] Cardiac and Pulmonary Rehab  Education need identified 06/01/21  Date 06/01/21  Educator KL  Instruction Review Code 1- Verbalizes Understanding       Education: Exercise Physiology & General Exercise Guidelines: - Group verbal and written instruction with models to review the exercise physiology of the cardiovascular  system and associated critical values. Provides general exercise guidelines with specific guidelines to those with heart or lung disease.    Education: Flexibility, Balance, Mind/Body Relaxation: - Group verbal and visual presentation with interactive activity on the components of exercise prescription. Introduces F.I.T.T principle from ACSM for exercise prescriptions. Reviews F.I.T.T. principles of flexibility and balance exercise training including progression. Also discusses the mind body connection.  Reviews various relaxation techniques to help reduce and manage stress (i.e. Deep breathing, progressive muscle relaxation, and visualization). Balance handout provided to take home. Written material given at graduation.   Activity Barriers & Risk Stratification:  Activity Barriers & Cardiac  Risk Stratification - 06/01/21 0944       Activity Barriers & Cardiac Risk Stratification   Activity Barriers Shortness of Breath;Joint Problems;Deconditioning             6 Minute Walk:  6 Minute Walk     Row Name 06/01/21 0947         6 Minute Walk   Phase Initial     Distance 1385 feet     Walk Time 6 minutes     # of Rest Breaks 0     MPH 2.62     METS 3.73     RPE 13     Perceived Dyspnea  2     VO2 Peak 13.07     Symptoms Yes (comment)     Comments SOB     Resting HR 62 bpm     Resting BP 140/82     Resting Oxygen Saturation  96 %     Exercise Oxygen Saturation  during 6 min walk 91 %     Max Ex. HR 106 bpm     Max Ex. BP 166/84     2 Minute Post BP 144/84           Interval HR   1 Minute HR 79     2 Minute HR 96     3 Minute HR 101     4 Minute HR 103     5 Minute HR 106     6 Minute HR 105     2 Minute Post HR 67     Interval Heart Rate? Yes           Interval Oxygen   Interval Oxygen? Yes     Baseline Oxygen Saturation % 96 %     1 Minute Oxygen Saturation % 93 %     1 Minute Liters of Oxygen 2 L     2 Minute Oxygen Saturation % 92 %     2 Minute Liters of  Oxygen 2 L     3 Minute Oxygen Saturation % 93 %     3 Minute Liters of Oxygen 2 L     4 Minute Oxygen Saturation % 92 %     4 Minute Liters of Oxygen 2 L     5 Minute Oxygen Saturation % 91 %     5 Minute Liters of Oxygen 2 L     6 Minute Oxygen Saturation % 92 %     6 Minute Liters of Oxygen 2 L     2 Minute Post Oxygen Saturation % 94 %     2 Minute Post Liters of Oxygen 2 L             Oxygen Initial Assessment:  Oxygen Initial Assessment - 06/01/21 0949       Home Oxygen   Home Oxygen Device E-Tanks    Sleep Oxygen Prescription Continuous    Liters per minute 2    Home Exercise Oxygen Prescription None    Home Resting Oxygen Prescription None    Compliance with Home Oxygen Use Yes      Initial 6 min Walk   Oxygen Used Continuous    Liters per minute 2      Program Oxygen Prescription   Program Oxygen Prescription Continuous    Liters per minute 2      Intervention   Short Term Goals To learn and understand importance of monitoring SPO2 with pulse oximeter  and demonstrate accurate use of the pulse oximeter.;To learn and understand importance of maintaining oxygen saturations>88%;To learn and exhibit compliance with exercise, home and travel O2 prescription;To learn and demonstrate proper pursed lip breathing techniques or other breathing techniques. ;To learn and demonstrate proper use of respiratory medications    Long  Term Goals Exhibits compliance with exercise, home  and travel O2 prescription;Verbalizes importance of monitoring SPO2 with pulse oximeter and return demonstration;Maintenance of O2 saturations>88%;Compliance with respiratory medication;Exhibits proper breathing techniques, such as pursed lip breathing or other method taught during program session;Demonstrates proper use of MDI's             Oxygen Re-Evaluation:  Oxygen Re-Evaluation     Row Name 06/08/21 0933             Program Oxygen Prescription   Program Oxygen Prescription  Continuous       Liters per minute 2               Home Oxygen   Home Oxygen Device E-Tanks       Sleep Oxygen Prescription Continuous       Liters per minute 2       Home Exercise Oxygen Prescription None       Home Resting Oxygen Prescription None       Compliance with Home Oxygen Use Yes               Goals/Expected Outcomes   Short Term Goals To learn and understand importance of monitoring SPO2 with pulse oximeter and demonstrate accurate use of the pulse oximeter.;To learn and understand importance of maintaining oxygen saturations>88%;To learn and exhibit compliance with exercise, home and travel O2 prescription;To learn and demonstrate proper pursed lip breathing techniques or other breathing techniques.        Long  Term Goals Exhibits compliance with exercise, home  and travel O2 prescription;Verbalizes importance of monitoring SPO2 with pulse oximeter and return demonstration;Maintenance of O2 saturations>88%;Exhibits proper breathing techniques, such as pursed lip breathing or other method taught during program session;Compliance with respiratory medication       Comments Reviewed PLB technique with pt.  Talked about how it works and it's importance in maintaining their exercise saturations.       Goals/Expected Outcomes Short: Become more profiecient at using PLB.   Long: Become independent at using PLB.                Oxygen Discharge (Final Oxygen Re-Evaluation):  Oxygen Re-Evaluation - 06/08/21 0933       Program Oxygen Prescription   Program Oxygen Prescription Continuous    Liters per minute 2      Home Oxygen   Home Oxygen Device E-Tanks    Sleep Oxygen Prescription Continuous    Liters per minute 2    Home Exercise Oxygen Prescription None    Home Resting Oxygen Prescription None    Compliance with Home Oxygen Use Yes      Goals/Expected Outcomes   Short Term Goals To learn and understand importance of monitoring SPO2 with pulse oximeter and demonstrate  accurate use of the pulse oximeter.;To learn and understand importance of maintaining oxygen saturations>88%;To learn and exhibit compliance with exercise, home and travel O2 prescription;To learn and demonstrate proper pursed lip breathing techniques or other breathing techniques.     Long  Term Goals Exhibits compliance with exercise, home  and travel O2 prescription;Verbalizes importance of monitoring SPO2 with pulse oximeter and return demonstration;Maintenance of O2 saturations>88%;Exhibits proper  breathing techniques, such as pursed lip breathing or other method taught during program session;Compliance with respiratory medication    Comments Reviewed PLB technique with pt.  Talked about how it works and it's importance in maintaining their exercise saturations.    Goals/Expected Outcomes Short: Become more profiecient at using PLB.   Long: Become independent at using PLB.             Initial Exercise Prescription:  Initial Exercise Prescription - 06/01/21 1000       Date of Initial Exercise RX and Referring Provider   Date 06/01/21    Referring Provider Fredonia Highland, MD      Oxygen   Oxygen Continuous    Liters 2    Maintain Oxygen Saturation 88% or higher      Treadmill   MPH 2.3    Grade 0.5    Minutes 15    METs 2.92      Recumbant Bike   Level 2    RPM 60    Watts 30    Minutes 15    METs 3.7      REL-XR   Level 2    Speed 50    Minutes 15    METs 3.7      Prescription Details   Frequency (times per week) 2    Duration Progress to 30 minutes of continuous aerobic without signs/symptoms of physical distress      Intensity   THRR 40-80% of Max Heartrate 101-141    Ratings of Perceived Exertion 11-13    Perceived Dyspnea 0-4      Progression   Progression Continue to progress workloads to maintain intensity without signs/symptoms of physical distress.      Resistance Training   Training Prescription Yes    Weight 4 lb    Reps 10-15              Perform Capillary Blood Glucose checks as needed.  Exercise Prescription Changes:   Exercise Prescription Changes     Row Name 06/01/21 1000             Response to Exercise   Blood Pressure (Admit) 140/82       Blood Pressure (Exercise) 166/84       Blood Pressure (Exit) 144/84       Heart Rate (Admit) 62 bpm       Heart Rate (Exercise) 106 bpm       Heart Rate (Exit) 67 bpm       Oxygen Saturation (Admit) 96 %       Oxygen Saturation (Exercise) 91 %       Oxygen Saturation (Exit) 94 %       Rating of Perceived Exertion (Exercise) 13       Perceived Dyspnea (Exercise) 2       Symptoms SOB       Comments walk test results                Exercise Comments:   Exercise Comments     Row Name 06/08/21 0932           Exercise Comments First full day of exercise!  Patient was oriented to gym and equipment including functions, settings, policies, and procedures.  Patient's individual exercise prescription and treatment plan were reviewed.  All starting workloads were established based on the results of the 6 minute walk test done at initial orientation visit.  The plan for exercise progression was also introduced and  progression will be customized based on patient's performance and goals.                Exercise Goals and Review:   Exercise Goals     Row Name 06/01/21 1055             Exercise Goals   Increase Physical Activity Yes       Intervention Provide advice, education, support and counseling about physical activity/exercise needs.;Develop an individualized exercise prescription for aerobic and resistive training based on initial evaluation findings, risk stratification, comorbidities and participant's personal goals.       Expected Outcomes Short Term: Attend rehab on a regular basis to increase amount of physical activity.;Long Term: Add in home exercise to make exercise part of routine and to increase amount of physical activity.;Long Term:  Exercising regularly at least 3-5 days a week.       Increase Strength and Stamina Yes       Intervention Provide advice, education, support and counseling about physical activity/exercise needs.;Develop an individualized exercise prescription for aerobic and resistive training based on initial evaluation findings, risk stratification, comorbidities and participant's personal goals.       Expected Outcomes Short Term: Increase workloads from initial exercise prescription for resistance, speed, and METs.;Short Term: Perform resistance training exercises routinely during rehab and add in resistance training at home;Long Term: Improve cardiorespiratory fitness, muscular endurance and strength as measured by increased METs and functional capacity ( )       Able to understand and use rate of perceived exertion (RPE) scale Yes       Intervention Provide education and explanation on how to use RPE scale       Expected Outcomes Short Term: Able to use RPE daily in rehab to express subjective intensity level;Long Term:  Able to use RPE to guide intensity level when exercising independently       Able to understand and use Dyspnea scale Yes       Intervention Provide education and explanation on how to use Dyspnea scale       Expected Outcomes Short Term: Able to use Dyspnea scale daily in rehab to express subjective sense of shortness of breath during exertion;Long Term: Able to use Dyspnea scale to guide intensity level when exercising independently       Knowledge and understanding of Target Heart Rate Range (THRR) Yes       Intervention Provide education and explanation of THRR including how the numbers were predicted and where they are located for reference       Expected Outcomes Short Term: Able to state/look up THRR;Long Term: Able to use THRR to govern intensity when exercising independently;Short Term: Able to use daily as guideline for intensity in rehab       Able to check pulse independently Yes        Intervention Provide education and demonstration on how to check pulse in carotid and radial arteries.;Review the importance of being able to check your own pulse for safety during independent exercise       Expected Outcomes Short Term: Able to explain why pulse checking is important during independent exercise;Long Term: Able to check pulse independently and accurately       Understanding of Exercise Prescription Yes       Intervention Provide education, explanation, and written materials on patient's individual exercise prescription       Expected Outcomes Short Term: Able to explain program exercise prescription;Long Term: Able to explain home  exercise prescription to exercise independently                Exercise Goals Re-Evaluation :  Exercise Goals Re-Evaluation     Row Name 06/08/21 0932             Exercise Goal Re-Evaluation   Exercise Goals Review Increase Physical Activity;Able to understand and use rate of perceived exertion (RPE) scale;Knowledge and understanding of Target Heart Rate Range (THRR);Understanding of Exercise Prescription;Increase Strength and Stamina;Able to understand and use Dyspnea scale;Able to check pulse independently       Comments Reviewed RPE and dyspnea scales, THR and program prescription with pt today.  Pt voiced understanding and was given a copy of goals to take home.       Expected Outcomes Short: Use RPE daily to regulate intensity. Long: Follow program prescription in THR.                Discharge Exercise Prescription (Final Exercise Prescription Changes):  Exercise Prescription Changes - 06/01/21 1000       Response to Exercise   Blood Pressure (Admit) 140/82    Blood Pressure (Exercise) 166/84    Blood Pressure (Exit) 144/84    Heart Rate (Admit) 62 bpm    Heart Rate (Exercise) 106 bpm    Heart Rate (Exit) 67 bpm    Oxygen Saturation (Admit) 96 %    Oxygen Saturation (Exercise) 91 %    Oxygen Saturation (Exit) 94 %     Rating of Perceived Exertion (Exercise) 13    Perceived Dyspnea (Exercise) 2    Symptoms SOB    Comments walk test results             Nutrition:  Target Goals: Understanding of nutrition guidelines, daily intake of sodium 1500mg , cholesterol 200mg , calories 30% from fat and 7% or less from saturated fats, daily to have 5 or more servings of fruits and vegetables.  Education: All About Nutrition: -Group instruction provided by verbal, written material, interactive activities, discussions, models, and posters to present general guidelines for heart healthy nutrition including fat, fiber, MyPlate, the role of sodium in heart healthy nutrition, utilization of the nutrition label, and utilization of this knowledge for meal planning. Follow up email sent as well. Written material given at graduation. Flowsheet Row Pulmonary Rehab [facility] Card[facility] Pulmonary Rehab  Education need identified 06/01/21       Biometrics:  Pre Biometrics - 06/01/21 0944       Pre Biometrics   Height 6' (1.829 m)    Weight 238 lb 9.6 oz (108.2 kg)    BMI (Calculated) 32.35    Single Leg Stand 3.93 seconds              Nutrition Therapy Plan and Nutrition Goals:  Nutrition Therapy & Goals - 06/01/21 0955       Intervention Plan   Intervention Prescribe, educate and counsel regarding individualized specific dietary modifications aiming towards targeted core components such as weight, hypertension, lipid management, diabetes, heart failure and other comorbidities.    Expected Outcomes Short Term Goal: Understand basic principles of dietary content, such as calories, fat, sodium, cholesterol and nutrients.;Short Term Goal: A plan has been developed with personal nutrition goals set during dietitian appointment.;Long Term Goal: Adherence to prescribed nutrition plan.             Nutrition Assessments:  MEDIFICTS Score Key: ?70 Need to make dietary changes  40-70 Heart  Healthy Diet ?  40 Therapeutic Level Cholesterol Diet  Flowsheet Row Pulmonary Rehab from 06/01/2021 in [facility] Cardiac and Pulmonary Rehab  Picture Your Plate Total Score on Admission 49      Picture Your Plate Scores: <40 Unhealthy dietary pattern with much room for improvement. 41-50 Dietary pattern unlikely to meet recommendations for good health and room for improvement. 51-60 More healthful dietary pattern, with some room for improvement.  >60 Healthy dietary pattern, although there may be some specific behaviors that could be improved.   Nutrition Goals Re-Evaluation:   Nutrition Goals Discharge (Final Nutrition Goals Re-Evaluation):   Psychosocial: Target Goals: Acknowledge presence or absence of significant depression and/or stress, maximize coping skills, provide positive support system. Participant is able to verbalize types and ability to use techniques and skills needed for reducing stress and depression.   Education: Stress, Anxiety, and Depression - Group verbal and visual presentation to define topics covered.  Reviews how body is impacted by stress, anxiety, and depression.  Also discusses healthy ways to reduce stress and to treat/manage anxiety and depression.  Written material given at graduation.   Education: Sleep Hygiene -Provides group verbal and written instruction about how sleep can affect your health.  Define sleep hygiene, discuss sleep cycles and impact of sleep habits. Review good sleep hygiene tips.    Initial Review & Psychosocial Screening:  Initial Psych Review & Screening - 05/21/21 1509       Initial Review   Current issues with Current Sleep Concerns      Family Dynamics   Good Support System? Yes      Barriers   Psychosocial barriers to participate in program There are no identifiable barriers or psychosocial needs.;The patient should benefit from training in stress management and relaxation.      Screening Interventions   Interventions  Encouraged to exercise;Provide feedback about the scores to participant;To provide support and resources with identified psychosocial needs    Expected Outcomes Short Term goal: Utilizing psychosocial counselor, staff and physician to assist with identification of specific Stressors or current issues interfering with healing process. Setting desired goal for each stressor or current issue identified.;Long Term Goal: Stressors or current issues are controlled or eliminated.;Short Term goal: Identification and review with participant of any Quality of Life or Depression concerns found by scoring the questionnaire.;Long Term goal: The participant improves quality of Life and PHQ9 Scores as seen by post scores and/or verbalization of changes             Quality of Life Scores:  Scores of 19 and below usually indicate a poorer quality of life in these areas.  A difference of  2-3 points is a clinically meaningful difference.  A difference of 2-3 points in the total score of the Quality of Life Index has been associated with significant improvement in overall quality of life, self-image, physical symptoms, and general health in studies assessing change in quality of life.  PHQ-9: Recent Review Flowsheet Data     Depression screen [facility] 2/9 06/01/2021   Decreased Interest 0   Down, Depressed, Hopeless 1   PHQ - 2 Score 1   Altered sleeping 2   Tired, decreased energy 1   Change in appetite 1   Feeling bad or failure about yourself  1   Trouble concentrating 1   Moving slowly or fidgety/restless 0   Suicidal thoughts 0   PHQ-9 Score 7   Difficult doing work/chores Somewhat difficult      Interpretation of Total Score  Total Score Depression Severity:  1-4 = Minimal depression, 5-9 = Mild depression, 10-14 = Moderate depression, 15-19 = Moderately severe depression, 20-27 = Severe depression   Psychosocial Evaluation and Intervention:  Psychosocial Evaluation - 05/21/21 1526        Psychosocial Evaluation & Interventions   Interventions Encouraged to exercise with the program and follow exercise prescription    Comments Mr. Muscarella breathing has been a concern for him for a while. Thankfully he has been able to wean himself off his oxygen during the day. Currently, he requires 2L at night but is getting a sleep study soon to determine if he needs CPAP. His sister in law is involved in his care. He plans on going back to work Sept 19 so he isn't sure what his schedule will look like.    Expected Outcomes Short: attend pulmonary rehab for education and exercise. Long: develop and maintain positive self care habits.    Continue Psychosocial Services  Follow up required by staff             Psychosocial Re-Evaluation:   Psychosocial Discharge (Final Psychosocial Re-Evaluation):   Education: Education Goals: Education classes will be provided on a weekly basis, covering required topics. Participant will state understanding/return demonstration of topics presented.  Learning Barriers/Preferences:   General Pulmonary Education Topics:  Infection Prevention: - Provides verbal and written material to individual with discussion of infection control including proper hand washing and proper equipment cleaning during exercise session. Flowsheet Row Pulmonary Rehab from 06/01/2021 in [facility] Cardiac and Pulmonary Rehab  Education need identified 06/01/21  Date 06/01/21  Educator KL  Instruction Review Code 1- Verbalizes Understanding       Falls Prevention: - Provides verbal and written material to individual with discussion of falls prevention and safety. Flowsheet Row Pulmonary Rehab from 06/01/2021 in [facility] and Pulmonary Rehab  Education need identified 06/01/21  Date 06/01/21  Educator KL  Instruction Review Code 1- Verbalizes Understanding       Chronic Lung Disease Review: - Group verbal instruction with posters, models, PowerPoint presentations and  videos,  to review new updates, new respiratory medications, new advancements in procedures and treatments. Providing information on websites and "800" numbers for continued self-education. Includes information about supplement oxygen, available portable oxygen systems, continuous and intermittent flow rates, oxygen safety, concentrators, and Medicare reimbursement for oxygen. Explanation of Pulmonary Drugs, including class, frequency, complications, importance of spacers, rinsing mouth after steroid MDI's, and proper cleaning methods for nebulizers. Review of basic lung anatomy and physiology related to function, structure, and complications of lung disease. Review of risk factors. Discussion about methods for diagnosing sleep apnea and types of masks and machines for OSA. Includes a review of the use of types of environmental controls: home humidity, furnaces, filters, dust mite/pet prevention, HEPA vacuums. Discussion about weather changes, air quality and the benefits of nasal washing. Instruction on Warning signs, infection symptoms, calling MD promptly, preventive modes, and value of vaccinations. Review of effective airway clearance, coughing and/or vibration techniques. Emphasizing that all should Create an Action Plan. Written material given at graduation. Flowsheet Row Pulmonary Rehab from 06/01/2021 in [facility] Cardiac and Pulmonary Rehab  Education need identified 06/01/21       AED/CPR: - Group verbal and written instruction with the use of models to demonstrate the basic use of the AED with the basic ABC's of resuscitation.    Anatomy and Cardiac Procedures: - Group verbal and visual presentation and models provide information about basic cardiac  anatomy and function. Reviews the testing methods done to diagnose heart disease and the outcomes of the test results. Describes the treatment choices: Medical Management, Angioplasty, or Coronary Bypass Surgery for treating various heart conditions  including Myocardial Infarction, Angina, Valve Disease, and Cardiac Arrhythmias.  Written material given at graduation.   Medication Safety: - Group verbal and visual instruction to review commonly prescribed medications for heart and lung disease. Reviews the medication, class of the drug, and side effects. Includes the steps to properly store meds and maintain the prescription regimen.  Written material given at graduation.   Other: -Provides group and verbal instruction on various topics (see comments)   Knowledge Questionnaire Score:  Knowledge Questionnaire Score - 06/01/21 0955       Knowledge Questionnaire Score   Pre Score 13/18: ADL, O2, PLB              Core Components/Risk Factors/Patient Goals at Admission:  Personal Goals and Risk Factors at Admission - 06/01/21 1056       Core Components/Risk Factors/Patient Goals on Admission    Weight Management Yes;Weight Loss    Intervention Weight Management: Develop a combined nutrition and exercise program designed to reach desired caloric intake, while maintaining appropriate intake of nutrient and fiber, sodium and fats, and appropriate energy expenditure required for the weight goal.;Weight Management: Provide education and appropriate resources to help participant work on and attain dietary goals.;Weight Management/Obesity: Establish reasonable short term and long term weight goals.    Admit Weight 238 lb (108 kg)    Goal Weight: Short Term 233 lb (105.7 kg)    Goal Weight: Long Term 220 lb (99.8 kg)    Expected Outcomes Short Term: Continue to assess and modify interventions until short term weight is achieved;Long Term: Adherence to nutrition and physical activity/exercise program aimed toward attainment of established weight goal;Weight Loss: Understanding of general recommendations for a balanced deficit meal plan, which promotes 1-2 lb weight loss per week and includes a negative energy balance of [phone]  kcal/d;Understanding recommendations for meals to include 15-35% energy as protein, 25-35% energy from fat, 35-60% energy from carbohydrates, less than  of dietary cholesterol, 20-35 gm of total fiber daily;Understanding of distribution of calorie intake throughout the day with the consumption of 4-5 meals/snacks    Improve shortness of breath with ADL's Yes    Intervention Provide education, individualized exercise plan and daily activity instruction to help decrease symptoms of SOB with activities of daily living.    Expected Outcomes Short Term: Improve cardiorespiratory fitness to achieve a reduction of symptoms when performing ADLs;Long Term: Be able to perform more ADLs without symptoms or delay the onset of symptoms    Hypertension Yes    Intervention Monitor prescription use compliance.;Provide education on lifestyle modifcations including regular physical activity/exercise, weight management, moderate sodium restriction and increased consumption of fresh fruit, vegetables, and low fat dairy, alcohol moderation, and smoking cessation.    Expected Outcomes Short Term: Continued assessment and intervention until BP is < 140/57mm HG in hypertensive participants. < 130/41mm HG in hypertensive participants with diabetes, heart failure or chronic kidney disease.;Long Term: Maintenance of blood pressure at goal levels.    Lipids Yes    Intervention Provide education and support for participant on nutrition & aerobic/resistive exercise along with prescribed medications to achieve LDL 70mg , HDL >40mg .    Expected Outcomes Short Term: Participant states understanding of desired cholesterol values and is compliant with medications prescribed. Participant is following exercise prescription and nutrition  guidelines.;Long Term: Cholesterol controlled with medications as prescribed, with individualized exercise RX and with personalized nutrition plan. Value goals: LDL < 70mg , HDL > 40 mg.              Education:Diabetes - Individual verbal and written instruction to review signs/symptoms of diabetes, desired ranges of glucose level fasting, after meals and with exercise. Acknowledge that pre and post exercise glucose checks will be done for 3 sessions at entry of program.   Know Your Numbers and Heart Failure: - Group verbal and visual instruction to discuss disease risk factors for cardiac and pulmonary disease and treatment options.  Reviews associated critical values for Overweight/Obesity, Hypertension, Cholesterol, and Diabetes.  Discusses basics of heart failure: signs/symptoms and treatments.  Introduces Heart Failure Zone chart for action plan for heart failure.  Written material given at graduation.   Core Components/Risk Factors/Patient Goals Review:    Core Components/Risk Factors/Patient Goals at Discharge (Final Review):    ITP Comments:  ITP Comments     Row Name 05/21/21 1520 06/01/21 0940 06/08/21 0932 06/09/21 08/09/21     ITP Comments Initial telephone orientation completed. Diagnosis can be found in [facility] 7/25. EP orientation scheduled for Tuesday 8/30 at 8 am. Completed 01-24-2004 and gym orientation. Initial ITP created and sent for review to Dr. , Medical Director. First full day of exercise!  Patient was oriented to gym and equipment including functions, settings, policies, and procedures.  Patient's individual exercise prescription and treatment plan were reviewed.  All starting workloads were established based on the results of the 6 minute walk test done at initial orientation visit.  The plan for exercise progression was also introduced and progression will be customized based on patient's performance and goals. 30 Day review completed. Medical Director ITP review done, changes made as directed, and signed approval by Medical Director.   New             Comments:

## 2021-06-10 ENCOUNTER — Other Ambulatory Visit: Payer: Self-pay

## 2021-06-10 ENCOUNTER — Encounter: Payer: BC Managed Care – PPO | Admitting: *Deleted

## 2021-06-10 DIAGNOSIS — J9601 Acute respiratory failure with hypoxia: Secondary | ICD-10-CM

## 2021-06-10 NOTE — Progress Notes (Signed)
Daily Session Note  Patient Details  Name: Danny Norris MRN: [id number] Date of Birth: 04/20/1961 Referring Provider:   Flowsheet Row Pulmonary Rehab from 06/01/2021 in [facility] Cardiac and Pulmonary Rehab  Referring Provider [name], MD       Encounter Date: 06/10/2021  Check In:  Session Check In - 06/10/21 0928       Check-In   Supervising physician immediately available to respond to emergencies See telemetry face sheet for immediately available ER MD    Location [facility]-Cardiac & Pulmonary Rehab    Staff Present Heath Lark, RN, BSN, CCRP;Laureen Owens Shark, BS, RRT, CPFT;Amanda Oletta Darter, BA, ACSM CEP, Exercise Physiologist    Virtual Visit No    Medication changes reported     No    Fall or balance concerns reported    No    Warm-up and Cool-down Performed on first and last piece of equipment    Resistance Training Performed Yes    VAD Patient? No    PAD/SET Patient? No      Pain Assessment   Currently in Pain? No/denies                Social History   Tobacco Use  Smoking Status Never  Smokeless Tobacco Never    Goals Met:  Proper associated with RPD/PD & O2 Sat Independence with exercise equipment Exercise tolerated well No report of concerns or symptoms today  Goals Unmet:  Not Applicable  Comments: Pt able to follow exercise prescription today without complaint.  Will continue to monitor for progression.    Dr. Emily Filbert is Medical Director for [place].  Dr. Ottie Glazier is Medical Director for [facility] Pulmonary Rehabilitation.

## 2021-06-15 ENCOUNTER — Other Ambulatory Visit: Payer: Self-pay

## 2021-06-15 DIAGNOSIS — J9601 Acute respiratory failure with hypoxia: Secondary | ICD-10-CM | POA: Diagnosis not present

## 2021-06-15 NOTE — Progress Notes (Signed)
Daily Session Note  Patient Details  Name: Danny Norris MRN: [id number] Date of Birth: January 25, 1961 Referring Provider:   Flowsheet Row Pulmonary Rehab from 06/01/2021 in [facility] Cardiac and Pulmonary Rehab  Referring Provider [name], MD       Encounter Date: 06/15/2021  Check In:  Session Check In - 06/15/21 0922       Check-In   Supervising physician immediately available to respond to emergencies See telemetry face sheet for immediately available ER MD    Location [facility]-Cardiac & Pulmonary Rehab    Staff Present Birdie Sons, MPA, RN;Jessica Luan Pulling, MA, RCEP, CCRP, CCET;Amanda Sommer, BA, ACSM CEP, Exercise Physiologist    Virtual Visit No    Medication changes reported     No    Fall or balance concerns reported    No    Warm-up and Cool-down Performed on first and last piece of equipment    Resistance Training Performed Yes    VAD Patient? No    PAD/SET Patient? No      Pain Assessment   Currently in Pain? No/denies                Social History   Tobacco Use  Smoking Status Never  Smokeless Tobacco Never    Goals Met:  Independence with exercise equipment Exercise tolerated well No report of concerns or symptoms today Strength training completed today  Goals Unmet:  Not Applicable  Comments: Pt able to follow exercise prescription today without complaint.  Will continue to monitor for progression.    Dr. Emily Filbert is Medical Director for [place].  Dr. Ottie Glazier is Medical Director for [facility] Pulmonary Rehabilitation.

## 2021-06-16 NOTE — Procedures (Signed)
NAME: Danny Norris DATE OF BIRTH:  [birth year] MEDICAL RECORD NUMBER [id number]  LOCATION: [facility]  PHYSICIAN: Deretha Emory  DATE OF STUDY: 06/06/2021  SLEEP STUDY TYPE: Positive Airway Pressure Titration               REFERRING PHYSICIAN: Deretha Emory, MD   EPWORTH SLEEPINESS SCORE:  11 HEIGHT: 6' (182.9 cm)  WEIGHT: 237 lb (107.5 kg)    Body mass index is 32.14 kg/m.  NECK SIZE: 17 in.  CLINICAL INFORMATION The patient was referred to the sleep center for PAP titration. His most recent polysomnogram, dated 05/02/2021, revealed an AHI of 12.0/h and RDI of 14.6/h with prolonged desaturations. He has a recent history of acute respiratory failure and is still on oxygen at home.   MEDICATIONS Patient self administered medications include: apixaban, DOCUSATE SODIUM. No sleep medicine administered.Marland Kitchen  SLEEP STUDY TECHNIQUE The patient underwent an attended overnight polysomnography titration to assess the effects of cpap therapy. The following variables were monitored: EEG(C4-A1, C3-A2, O1-A2, O2-A1), EOG, submental and leg EMG, ECG, oxyhemoglobin saturation by pulse oximetry, thoracic and abdominal respiratory effort belts, nasal/oral airflow by pressure sensor, body position sensor and snoring sensor. CPAP pressure was titrated to eliminate apneas, hypopneas and oxygen desaturation.  TECHNICAL COMMENTS Comments added by Technician: Patient had difficulty initiating sleep. He could not get back to sleep after I asked him to sleep on back. Comments added by Scorer: N/A  SLEEP ARCHITECTURE The study was initiated at 10:34:11 PM and terminated at 4:46:24 AM. Total recorded time was 372.2 minutes. EEG confirmed total sleep time was 170.3 minutes yielding a sleep efficiency of 45.8%%. Sleep onset after lights out was 17.9 minutes with a REM latency of 286.5 minutes. The patient spent 2.3%% of the night in stage N1 sleep, 81.2%% in stage N2 sleep, 1.2%% in  stage N3 and 15.3% in REM. The Arousal Index was 5.3/hour.  RESPIRATORY PARAMETERS The overall AHI was 0.0 per hour, and the RDI was 0.4 events/hour with a central apnea index of 0 events per hour. He was started on CPAP 4 and titrated upward for snoring events. Few events c/w apneas and hypopneas occurred even at low pressures. The most appropriate setting of CPAP was IPAP/EPAP 12/12 cm H2O. At this setting, the sleep efficiency was 87%, the patient was supine for 0%, the AHI was 0 events per hour, the RDI was 0 events/hour (with 0 central events), the arousal index was 0 per hour and the oxygen nadir was 90.0%. No supplemental oxygen was required.   LEG MOVEMENT DATA The total leg movements were 125 with a resulting leg movement index of 44.0/hr. Associated arousal with leg movement index was 2.8/hr.  CARDIAC DATA The underlying cardiac rhythm was most consistent with sinus rhythm. Mean heart rate during sleep was 45.9 bpm. Additional rhythm abnormalities include None.  IMPRESSIONS - Obstructive Sleep Apnea (OSA). Optimal pressure attained. - Significant periodic leg movements(PLMs) during sleep, resolved with higher pressures. Few associated arousals. Loraine Leriche reduced sleep efficiency  DIAGNOSIS - Obstructive Sleep Apnea (G47.33)  RECOMMENDATIONS - Trial of CPAP therapy on 12 cm H2O  or APAP 9-14 with a Medium size Fisher&Paykel Full Face Mask F&P Vitera (new) mask and heated humidification.  Deretha Emory Sleep specialist, American Board of Internal Medicine  ELECTRONICALLY SIGNED ON:  06/16/2021, 6:39 AM [facility] PH: [phone]   FX: [phone] ACCREDITED BY THE AMERICAN ACADEMY OF SLEEP MEDICINE

## 2021-06-17 ENCOUNTER — Other Ambulatory Visit: Payer: Self-pay

## 2021-06-17 DIAGNOSIS — J9601 Acute respiratory failure with hypoxia: Secondary | ICD-10-CM

## 2021-06-17 NOTE — Progress Notes (Signed)
Daily Session Note  Patient Details  Name: Danny Norris MRN: [id number] Date of Birth: [birth year] Referring Provider:   Flowsheet Row Pulmonary Rehab from 06/01/2021 in [facility] Cardiac and Pulmonary Rehab  Referring Provider [name], MD       Encounter Date: 06/17/2021  Check In:  Session Check In - 06/17/21 0909       Check-In   Supervising physician immediately available to respond to emergencies See telemetry face sheet for immediately available ER MD    Location [facility]-Cardiac & Pulmonary Rehab    Staff Present Birdie Sons, MPA, RN;Yahye Siebert [place], MA, RCEP, CCRP, Rosalio Macadamia, BS, ACSM CEP, Exercise Physiologist;Amanda Oletta Darter, BA, ACSM CEP, Exercise Physiologist;Joseph Ransom, [place]    Virtual Visit No    Medication changes reported     No    Fall or balance concerns reported    No    Warm-up and Cool-down Performed on first and last piece of equipment    Resistance Training Performed Yes    VAD Patient? No    PAD/SET Patient? No      Pain Assessment   Currently in Pain? No/denies                Social History   Tobacco Use  Smoking Status Never  Smokeless Tobacco Never    Goals Met:  Proper associated with RPD/PD & O2 Sat Independence with exercise equipment Exercise tolerated well No report of concerns or symptoms today Strength training completed today  Goals Unmet:  Not Applicable  Comments: Pt able to follow exercise prescription today without complaint.  Will continue to monitor for progression.  Reviewed home exercise with pt today.  Pt plans to walk at park and use staff videos at home for exercise.  Reviewed THR, pulse, RPE, sign and symptoms, pulse oximetery and when to call 911 or MD.  Also discussed weather considerations and indoor options.  Pt voiced understanding.   Dr. Emily Filbert is Medical Director for [place].  Dr. Ottie Glazier is Medical Director for [facility] Pulmonary  Rehabilitation.

## 2021-06-22 ENCOUNTER — Other Ambulatory Visit: Payer: Self-pay

## 2021-06-22 DIAGNOSIS — J9601 Acute respiratory failure with hypoxia: Secondary | ICD-10-CM

## 2021-06-22 NOTE — Progress Notes (Signed)
Daily Session Note  Patient Details  Name: Danny Norris MRN: [id number] Date of Birth: 11-Feb-1961 Referring Provider:   Flowsheet Row Pulmonary Rehab from 06/01/2021 in [facility] Cardiac and Pulmonary Rehab  Referring Provider [name], MD       Encounter Date: 06/22/2021  Check In:  Session Check In - 06/22/21 0916       Check-In   Supervising physician immediately available to respond to emergencies See telemetry face sheet for immediately available ER MD    Location [facility]-Cardiac & Pulmonary Rehab    Staff Present Birdie Sons, MPA, RN;Jessica Luan Pulling, MA, RCEP, CCRP, CCET;Amanda Sommer, BA, ACSM CEP, Exercise Physiologist    Virtual Visit No    Medication changes reported     No    Fall or balance concerns reported    No    Warm-up and Cool-down Performed on first and last piece of equipment    Resistance Training Performed Yes    VAD Patient? No    PAD/SET Patient? No      Pain Assessment   Currently in Pain? No/denies                Social History   Tobacco Use  Smoking Status Never  Smokeless Tobacco Never    Goals Met:  Independence with exercise equipment Exercise tolerated well No report of concerns or symptoms today Strength training completed today  Goals Unmet:  Not Applicable  Comments: Pt able to follow exercise prescription today without complaint.  Will continue to monitor for progression.    Dr. Emily Filbert is Medical Director for [place].  Dr. Ottie Glazier is Medical Director for [facility] Pulmonary Rehabilitation.

## 2021-06-24 ENCOUNTER — Other Ambulatory Visit: Payer: Self-pay

## 2021-06-24 DIAGNOSIS — J9601 Acute respiratory failure with hypoxia: Secondary | ICD-10-CM | POA: Diagnosis not present

## 2021-06-24 NOTE — Progress Notes (Signed)
Daily Session Note  Patient Details  Name: Danny Norris MRN: [id number] Date of Birth: 02/19/1961 Referring Provider:   Flowsheet Row Pulmonary Rehab from 06/01/2021 in [facility] Cardiac and Pulmonary Rehab  Referring Provider [name], MD       Encounter Date: 06/24/2021  Check In:  Session Check In - 06/24/21 0914       Check-In   Supervising physician immediately available to respond to emergencies See telemetry face sheet for immediately available ER MD    Location [facility]-Cardiac & Pulmonary Rehab    Staff Present Birdie Sons, MPA, Elveria Rising, BA, ACSM CEP, Exercise Physiologist;Tonya Carlile Amedeo Plenty, BS, ACSM CEP, Exercise Physiologist    Virtual Visit No    Medication changes reported     No    Fall or balance concerns reported    No    Warm-up and Cool-down Performed on first and last piece of equipment    Resistance Training Performed Yes    VAD Patient? No    PAD/SET Patient? No      Pain Assessment   Currently in Pain? No/denies                Social History   Tobacco Use  Smoking Status Never  Smokeless Tobacco Never    Goals Met:  Independence with exercise equipment Exercise tolerated well No report of concerns or symptoms today Strength training completed today  Goals Unmet:  Not Applicable  Comments: Pt able to follow exercise prescription today without complaint.  Will continue to monitor for progression.    Dr. Emily Filbert is Medical Director for [place].  Dr. Ottie Glazier is Medical Director for [facility] Pulmonary Rehabilitation.

## 2021-06-29 ENCOUNTER — Other Ambulatory Visit: Payer: Self-pay

## 2021-06-29 DIAGNOSIS — J9601 Acute respiratory failure with hypoxia: Secondary | ICD-10-CM | POA: Diagnosis not present

## 2021-06-29 NOTE — Progress Notes (Signed)
Daily Session Note  Patient Details  Name: Danny Norris MRN: [id number] Date of Birth: Jun 29, 1961 Referring Provider:   Flowsheet Row Pulmonary Rehab from 06/01/2021 in [facility] Cardiac and Pulmonary Rehab  Referring Provider [name], MD       Encounter Date: 06/29/2021  Check In:  Session Check In - 06/29/21 0915       Check-In   Supervising physician immediately available to respond to emergencies See telemetry face sheet for immediately available ER MD    Location [facility]-Cardiac & Pulmonary Rehab    Staff Present Birdie Sons, MPA, RN;Amanda Sommer, BA, ACSM CEP, Exercise Physiologist;Jessica Luan Pulling, MA, RCEP, CCRP, CCET    Virtual Visit No    Medication changes reported     No    Fall or balance concerns reported    No    Warm-up and Cool-down Performed on first and last piece of equipment    Resistance Training Performed Yes    VAD Patient? No    PAD/SET Patient? No      Pain Assessment   Currently in Pain? No/denies                Social History   Tobacco Use  Smoking Status Never  Smokeless Tobacco Never    Goals Met:  Independence with exercise equipment Exercise tolerated well No report of concerns or symptoms today Strength training completed today  Goals Unmet:  Not Applicable  Comments: Pt able to follow exercise prescription today without complaint.  Will continue to monitor for progression.    Dr. Emily Filbert is Medical Director for [place].  Dr. Ottie Glazier is Medical Director for [facility] Pulmonary Rehabilitation.

## 2021-07-01 ENCOUNTER — Encounter: Payer: BC Managed Care – PPO | Admitting: *Deleted

## 2021-07-01 ENCOUNTER — Other Ambulatory Visit: Payer: Self-pay

## 2021-07-01 DIAGNOSIS — J9601 Acute respiratory failure with hypoxia: Secondary | ICD-10-CM

## 2021-07-01 NOTE — Progress Notes (Signed)
Daily Session Note  Patient Details  Name: Danny Norris MRN: [id number] Date of Birth: [birth year] Referring Provider:   Flowsheet Row Pulmonary Rehab from 06/01/2021 in [facility]  Referring Provider [name], MD       Encounter Date: 07/01/2021  Check In:  Session Check In - 07/01/21 0917       Check-In   Supervising physician immediately available to respond to emergencies See telemetry face sheet for immediately available ER MD    Location [facility]-Cardiac & Pulmonary Rehab    Staff Present Earlean Shawl, BS, ACSM CEP, Exercise Physiologist;Amanda Oletta Darter, BA, ACSM CEP, Exercise Physiologist;Genavieve Mangiapane Kellie Shropshire, RN, BSN, MA    Virtual Visit No    Medication changes reported     No    Fall or balance concerns reported    No    Warm-up and Cool-down Performed on first and last piece of equipment    Resistance Training Performed Yes    VAD Patient? No    PAD/SET Patient? No      Pain Assessment   Currently in Pain? No/denies                Social History   Tobacco Use  Smoking Status Never  Smokeless Tobacco Never    Goals Met:  Proper associated with RPD/PD & O2 Sat Independence with exercise equipment Using PLB without cueing & demonstrates good technique Exercise tolerated well No report of concerns or symptoms today Strength training completed today  Goals Unmet:  Not Applicable  Comments: Pt able to follow exercise prescription today without complaint.  Will continue to monitor for progression.    Dr. Emily Filbert is Medical Director for [facility].  Dr. Ottie Glazier is Medical Director for [facility] Pulmonary Rehabilitation.

## 2021-07-06 ENCOUNTER — Encounter: Payer: BC Managed Care – PPO | Attending: Pulmonary Disease

## 2021-07-06 ENCOUNTER — Other Ambulatory Visit: Payer: Self-pay

## 2021-07-06 DIAGNOSIS — J9601 Acute respiratory failure with hypoxia: Secondary | ICD-10-CM | POA: Insufficient documentation

## 2021-07-06 NOTE — Progress Notes (Signed)
Daily Session Note  Patient Details  Name: ERSEL ENSLIN MRN: [id number] Date of Birth: October 31, 1960 Referring Provider:   Flowsheet Row Pulmonary Rehab from 06/01/2021 in [facility] Cardiac and Pulmonary Rehab  Referring Provider [name], MD       Encounter Date: 07/06/2021  Check In:  Session Check In - 07/06/21 0932       Check-In   Supervising physician immediately available to respond to emergencies See telemetry face sheet for immediately available ER MD    Location [facility]-Cardiac & Pulmonary Rehab    Staff Present Birdie Sons, MPA, RN;Amanda Sommer, BA, ACSM CEP, Exercise Physiologist;Jessica Luan Pulling, MA, RCEP, CCRP, CCET    Virtual Visit No    Medication changes reported     No    Fall or balance concerns reported    No    Warm-up and Cool-down Performed on first and last piece of equipment    Resistance Training Performed Yes    VAD Patient? No    PAD/SET Patient? No      Pain Assessment   Currently in Pain? No/denies                Social History   Tobacco Use  Smoking Status Never  Smokeless Tobacco Never    Goals Met:  Independence with exercise equipment Exercise tolerated well No report of concerns or symptoms today Strength training completed today  Goals Unmet:  Not Applicable  Comments: Pt able to follow exercise prescription today without complaint.  Will continue to monitor for progression.    Dr. Emily Filbert is Medical Director for [facility].  Dr. Ottie Glazier is Medical Director for [facility] Pulmonary Rehabilitation.

## 2021-07-07 ENCOUNTER — Encounter: Payer: Self-pay | Admitting: *Deleted

## 2021-07-07 DIAGNOSIS — J9601 Acute respiratory failure with hypoxia: Secondary | ICD-10-CM

## 2021-07-07 NOTE — Progress Notes (Signed)
Pulmonary Individual Treatment Plan  Patient Details  Name: Danny Norris MRN: [id number] Date of Birth: 02/18/1961 Referring Provider:   Flowsheet Row Pulmonary Rehab from 06/01/2021 in [facility] Cardiac and Pulmonary Rehab  Referring Provider [name], MD       Initial Encounter Date:  Flowsheet Row Pulmonary Rehab from 06/01/2021 in [facility] Cardiac and Pulmonary Rehab  Date 06/01/21       Visit Diagnosis: Acute respiratory failure with hypoxia ([place])  Patient's Home Medications on Admission:  Current Outpatient Medications:    apixaban (ELIQUIS) 5 MG TABS tablet, Apixaban 10 mg po bid x 3 days, than 5 mg po bid, Disp: 66 tablet, Rfl: 1   atorvastatin (LIPITOR) 20 MG tablet, Take 1 tablet (20 mg total) by mouth daily at 6 PM., Disp: 30 tablet, Rfl: 6   docusate sodium (COLACE) 100 MG capsule, Take 1 capsule (100 mg total) by mouth 2 (two) times daily., Disp: 10 capsule, Rfl: 0   feeding supplement (ENSURE ENLIVE / ENSURE PLUS) LIQD, Take 237 mLs by mouth 2 (two) times daily between meals., Disp: 237 mL, Rfl: 12   hydrochlorothiazide (HYDRODIURIL) 25 MG tablet, TAKE 1 TABLET BY MOUTH EVERY DAY (Patient taking differently: Take 25 mg by mouth daily.), Disp: 90 tablet, Rfl: 1   metoprolol succinate (TOPROL-XL) 50 MG 24 hr tablet, TAKE 1 TABLET BY MOUTH DAILY WITH OR IMMEDIATELY FOLLOWING A MEAL (Patient taking differently: Take 50 mg by mouth daily at 6 (six) AM.), Disp: 90 tablet, Rfl: 1   polyethylene glycol (MIRALAX / GLYCOLAX) 17 g packet, Take 17 g by mouth daily., Disp: 14 each, Rfl: 0  Past Medical History: Past Medical History:  Diagnosis Date   Dyslipidemia    HTN (hypertension)    Mitral valve regurgitation    a. 05/2011: TEE showing significant MVP especially involving the P2 segment without evidence for ruptured chordae and had eccentric MR anteriorly directed. b. 03/2015: echo showing EF 65-70% with posterior MV leaflet thickening and prolapse with mild MR.   Normal  coronary arteries    a. normal cors by cath in 08/2011    Tobacco Use: Social History   Tobacco Use  Smoking Status Never  Smokeless Tobacco Never    Labs: Recent Review Flowsheet Data     Labs for ITP Cardiac and Pulmonary Rehab Latest Ref Rng & Units 08/08/2019 12/24/2019 02/19/2021 02/20/2021 02/21/2021   Cholestrol 100 - 199 mg/dL 167 132 - - -   LDLCALC 0 - 99 mg/dL 110(H) 79 - - -   HDL >39 mg/dL 40 37(L) - - -   Trlycerides <150 mg/dL 89 81 - 93 -   Hemoglobin A1c 4.8 - 5.6 % - - 6.3(H) - -   PHART 7.350 - 7.450 - - 7.383 7.576(H) 7.463(H)   PCO2ART 32.0 - 48.0 mmHg - - 57.5(H) 31.0(L) 41.2   HCO3 20.0 - 28.0 mmol/L - - 34.0(H) 28.8(H) 29.5(H)   TCO2 22 - 32 mmol/L - - 36(H) - 31   ACIDBASEDEF 0.0 - 2.0 mmol/L - - - - -   O2SAT % - - 99.0 99.2 92.0        Pulmonary Assessment Scores:  Pulmonary Assessment Scores     Row Name 06/01/21 0950         ADL UCSD   ADL Phase Entry     SOB Score total 18     Rest 0     Walk 2     Stairs 1  Bath 0     Dress 0     Shop 1           CAT Score   CAT Score 7           mMRC Score   mMRC Score 1              UCSD: Self-administered rating of dyspnea associated with activities of daily living (ADLs) 6-point scale (0 = "not at all" to 5 = "maximal or unable to do because of breathlessness")  Scoring Scores range from 0 to 120.  Minimally important difference is 5 units  CAT: CAT can identify the health impairment of COPD patients and is better correlated with disease progression.  CAT has a scoring range of zero to 40. The CAT score is classified into four groups of low (less than 10), medium (10 - 20), high (21-30) and very high (31-40) based on the impact level of disease on health status. A CAT score over 10 suggests significant symptoms.  A worsening CAT score could be explained by an exacerbation, poor medication adherence, poor inhaler technique, or progression of COPD or comorbid conditions.  CAT MCID is  2 points  mMRC: mMRC (Modified Medical Research Council) Dyspnea Scale is used to assess the degree of baseline functional disability in patients of respiratory disease due to dyspnea. No minimal important difference is established. A decrease in score of 1 point or greater is considered a positive change.   Pulmonary Function Assessment:   Exercise Target Goals: Exercise Program Goal: Individual exercise prescription set using results from initial 6 min walk test and THRR while considering  patient's activity barriers and safety.   Exercise Prescription Goal: Initial exercise prescription builds to 30-45 minutes a day of aerobic activity, 2-3 days per week.  Home exercise guidelines will be given to patient during program as part of exercise prescription that the participant will acknowledge.  Education: Aerobic Exercise: - Group verbal and visual presentation on the components of exercise prescription. Introduces F.I.T.T principle from ACSM for exercise prescriptions.  Reviews F.I.T.T. principles of aerobic exercise including progression. Written material given at graduation.   Education: Resistance Exercise: - Group verbal and visual presentation on the components of exercise prescription. Introduces F.I.T.T principle from ACSM for exercise prescriptions  Reviews F.I.T.T. principles of resistance exercise including progression. Written material given at graduation.    Education: Exercise & Equipment Safety: - Individual verbal instruction and demonstration of equipment use and safety with use of the equipment. Flowsheet Row Pulmonary Rehab from 07/01/2021 in [facility] Cardiac and Pulmonary Rehab  Education need identified 06/01/21  Date 06/01/21  Educator [place]  Instruction Review Code 1- Verbalizes Understanding       Education: Exercise Physiology & General Exercise Guidelines: - Group verbal and written instruction with models to review the exercise physiology of the cardiovascular  system and associated critical values. Provides general exercise guidelines with specific guidelines to those with heart or lung disease.    Education: Flexibility, Balance, Mind/Body Relaxation: - Group verbal and visual presentation with interactive activity on the components of exercise prescription. Introduces F.I.T.T principle from ACSM for exercise prescriptions. Reviews F.I.T.T. principles of flexibility and balance exercise training including progression. Also discusses the mind body connection.  Reviews various relaxation techniques to help reduce and manage stress (i.e. Deep breathing, progressive muscle relaxation, and visualization). Balance handout provided to take home. Written material given at graduation. Flowsheet Row Pulmonary Rehab from 07/01/2021 in [facility] Cardiac and Pulmonary Rehab  Date 06/10/21  Educator [place]  Instruction Review Code 1- Verbalizes Understanding       Activity Barriers & Risk Stratification:  Activity Barriers & Cardiac Risk Stratification - 06/01/21 0944       Activity Barriers & Cardiac Risk Stratification   Activity Barriers Shortness of Breath;Joint Problems;Deconditioning             6 Minute Walk:  6 Minute Walk     Row Name 06/01/21 0947         6 Minute Walk   Phase Initial     Distance 1385 feet     Walk Time 6 minutes     # of Rest Breaks 0     MPH 2.62     METS 3.73     RPE 13     Perceived Dyspnea  2     VO2 Peak 13.07     Symptoms Yes (comment)     Comments SOB     Resting HR 62 bpm     Resting BP 140/82     Resting Oxygen Saturation  96 %     Exercise Oxygen Saturation  during 6 min walk 91 %     Max Ex. HR 106 bpm     Max Ex. BP 166/84     2 Minute Post BP 144/84           Interval HR   1 Minute HR 79     2 Minute HR 96     3 Minute HR 101     4 Minute HR 103     5 Minute HR 106     6 Minute HR 105     2 Minute Post HR 67     Interval Heart Rate? Yes           Interval Oxygen   Interval Oxygen? Yes      Baseline Oxygen Saturation % 96 %     1 Minute Oxygen Saturation % 93 %     1 Minute Liters of Oxygen 2 L     2 Minute Oxygen Saturation % 92 %     2 Minute Liters of Oxygen 2 L     3 Minute Oxygen Saturation % 93 %     3 Minute Liters of Oxygen 2 L     4 Minute Oxygen Saturation % 92 %     4 Minute Liters of Oxygen 2 L     5 Minute Oxygen Saturation % 91 %     5 Minute Liters of Oxygen 2 L     6 Minute Oxygen Saturation % 92 %     6 Minute Liters of Oxygen 2 L     2 Minute Post Oxygen Saturation % 94 %     2 Minute Post Liters of Oxygen 2 L             Oxygen Initial Assessment:  Oxygen Initial Assessment - 06/01/21 0949       Home Oxygen   Home Oxygen Device E-Tanks    Sleep Oxygen Prescription Continuous    Liters per minute 2    Home Exercise Oxygen Prescription None    Home Resting Oxygen Prescription None    Compliance with Home Oxygen Use Yes      Initial 6 min Walk   Oxygen Used Continuous    Liters per minute 2      Program Oxygen Prescription   Program Oxygen Prescription Continuous  Liters per minute 2      Intervention   Short Term Goals To learn and understand importance of monitoring SPO2 with pulse oximeter and demonstrate accurate use of the pulse oximeter.;To learn and understand importance of maintaining oxygen saturations>88%;To learn and exhibit compliance with exercise, home and travel O2 prescription;To learn and demonstrate proper pursed lip breathing techniques or other breathing techniques. ;To learn and demonstrate proper use of respiratory medications    Long  Term Goals Exhibits compliance with exercise, home  and travel O2 prescription;Verbalizes importance of monitoring SPO2 with pulse oximeter and return demonstration;Maintenance of O2 saturations>88%;Compliance with respiratory medication;Exhibits proper breathing techniques, such as pursed lip breathing or other method taught during program session;Demonstrates proper use of MDI's              Oxygen Re-Evaluation:  Oxygen Re-Evaluation     Row Name 06/08/21 0933 06/17/21 0950           Program Oxygen Prescription   Program Oxygen Prescription Continuous Continuous      Liters per minute 2 2             Home Oxygen   Home Oxygen Device E-Tanks E-Tanks      Sleep Oxygen Prescription Continuous Continuous;CPAP      Liters per minute 2 2  recommended      Home Exercise Oxygen Prescription None None  not currently prescribed      Home Resting Oxygen Prescription None None      Compliance with Home Oxygen Use Yes Yes             Goals/Expected Outcomes   Short Term Goals To learn and understand importance of monitoring SPO2 with pulse oximeter and demonstrate accurate use of the pulse oximeter.;To learn and understand importance of maintaining oxygen saturations>88%;To learn and exhibit compliance with exercise, home and travel O2 prescription;To learn and demonstrate proper pursed lip breathing techniques or other breathing techniques.  To learn and understand importance of monitoring SPO2 with pulse oximeter and demonstrate accurate use of the pulse oximeter.;To learn and understand importance of maintaining oxygen saturations>88%;To learn and exhibit compliance with exercise, home and travel O2 prescription;To learn and demonstrate proper pursed lip breathing techniques or other breathing techniques.       Long  Term Goals Exhibits compliance with exercise, home  and travel O2 prescription;Verbalizes importance of monitoring SPO2 with pulse oximeter and return demonstration;Maintenance of O2 saturations>88%;Exhibits proper breathing techniques, such as pursed lip breathing or other method taught during program session;Compliance with respiratory medication Exhibits compliance with exercise, home  and travel O2 prescription;Verbalizes importance of monitoring SPO2 with pulse oximeter and return demonstration;Maintenance of O2 saturations>88%;Exhibits proper breathing  techniques, such as pursed lip breathing or other method taught during program session;Compliance with respiratory medication      Comments Reviewed PLB technique with pt.  Talked about how it works and it's importance in maintaining their exercise saturations. Danny Norris is doing well with his oxygen in class and wearing it at night.  He has been prescribed a CPAP but has not gotten it yet.  We talked about monitoring his oxygen closely as he goes back to work this week to see if he needs oxygen while he is working.  He is planning to take his pulse oximeter with him.  Overall, he is doing well and uses his inhaler regularly.  He does not have a spacer but we will get him one soon.      Goals/Expected Outcomes Short:  Become more profiecient at using PLB.   Long: Become independent at using PLB. Short: Monitor oxygen levels at work Long:Continue to improve SOB and using PLB               Oxygen Discharge (Final Oxygen Re-Evaluation):  Oxygen Re-Evaluation - 06/17/21 0950       Program Oxygen Prescription   Program Oxygen Prescription Continuous    Liters per minute 2      Home Oxygen   Home Oxygen Device E-Tanks    Sleep Oxygen Prescription Continuous;CPAP    Liters per minute 2   recommended   Home Exercise Oxygen Prescription None   not currently prescribed   Home Resting Oxygen Prescription None    Compliance with Home Oxygen Use Yes      Goals/Expected Outcomes   Short Term Goals To learn and understand importance of monitoring SPO2 with pulse oximeter and demonstrate accurate use of the pulse oximeter.;To learn and understand importance of maintaining oxygen saturations>88%;To learn and exhibit compliance with exercise, home and travel O2 prescription;To learn and demonstrate proper pursed lip breathing techniques or other breathing techniques.     Long  Term Goals Exhibits compliance with exercise, home  and travel O2 prescription;Verbalizes importance of monitoring SPO2 with pulse  oximeter and return demonstration;Maintenance of O2 saturations>88%;Exhibits proper breathing techniques, such as pursed lip breathing or other method taught during program session;Compliance with respiratory medication    Comments Danny Norris is doing well with his oxygen in class and wearing it at night.  He has been prescribed a CPAP but has not gotten it yet.  We talked about monitoring his oxygen closely as he goes back to work this week to see if he needs oxygen while he is working.  He is planning to take his pulse oximeter with him.  Overall, he is doing well and uses his inhaler regularly.  He does not have a spacer but we will get him one soon.    Goals/Expected Outcomes Short: Monitor oxygen levels at work Long:Continue to improve SOB and using PLB             Initial Exercise Prescription:  Initial Exercise Prescription - 06/01/21 1000       Date of Initial Exercise RX and Referring Provider   Date 06/01/21    Referring Provider [name], MD      Oxygen   Oxygen Continuous    Liters 2    Maintain Oxygen Saturation 88% or higher      Treadmill   MPH 2.3    Grade 0.5    Minutes 15    METs 2.92      Recumbant Bike   Level 2    RPM 60    Watts 30    Minutes 15    METs 3.7      REL-XR   Level 2    Speed 50    Minutes 15    METs 3.7      Prescription Details   Frequency (times per week) 2    Duration Progress to 30 minutes of continuous aerobic without signs/symptoms of physical distress      Intensity   THRR 40-80% of Max Heartrate 101-141    Ratings of Perceived Exertion 11-13    Perceived Dyspnea 0-4      Progression   Progression Continue to progress workloads to maintain intensity without signs/symptoms of physical distress.      Resistance Training   Training Prescription Yes  Weight 4 lb    Reps 10-15             Perform Capillary Blood Glucose checks as needed.  Exercise Prescription Changes:   Exercise Prescription Changes      Row Name 06/01/21 1000 06/10/21 1500 06/17/21 0900 06/22/21 1400 07/05/21 1500     Response to Exercise   Blood Pressure (Admit) 140/82 122/68 -- 126/64 138/68   Blood Pressure (Exercise) 166/84 172/78 -- 120/64 146/82   Blood Pressure (Exit) 144/84 124/78 -- 114/66 124/70   Heart Rate (Admit) 62 bpm 68 bpm -- 74 bpm 69 bpm   Heart Rate (Exercise) 106 bpm 92 bpm -- 105 bpm 84 bpm   Heart Rate (Exit) 67 bpm 76 bpm -- 90 bpm 77 bpm   Oxygen Saturation (Admit) 96 % 94 % -- 91 % 94 %   Oxygen Saturation (Exercise) 91 % 93 % -- 93 % 92 %   Oxygen Saturation (Exit) 94 % 94 % -- 94 % 91 %   Rating of Perceived Exertion (Exercise) 13 12 -- 12 12   Perceived Dyspnea (Exercise) 2 2 -- 2 2   Symptoms SOB SOB -- -- SOB   Comments walk test results first full day exercise -- -- --   Duration -- Progress to 30 minutes of  aerobic without signs/symptoms of physical distress -- Continue with 30 min of aerobic exercise without signs/symptoms of physical distress. Continue with 30 min of aerobic exercise without signs/symptoms of physical distress.   Intensity -- THRR unchanged -- THRR unchanged THRR unchanged     Progression   Progression -- Continue to progress workloads to maintain intensity without signs/symptoms of physical distress. -- Continue to progress workloads to maintain intensity without signs/symptoms of physical distress. Continue to progress workloads to maintain intensity without signs/symptoms of physical distress.   Average METs -- 2.82 -- 2.82 3.08     Resistance Training   Training Prescription -- Yes -- Yes Yes   Weight -- 4 lb -- 4 lb 4 lb   Reps -- 10-15 -- 10-15 10-15     Interval Training   Interval Training -- -- -- -- No     Oxygen   Oxygen -- Continuous -- Continuous Continuous   Liters -- 2 -- 2 2     Treadmill   MPH -- 2.3 -- 2.3 2.3   Grade -- 0.5 -- 0.5 0.5   Minutes -- 15 -- 15 15   METs -- 2.92 -- 2.92 2.92     Recumbant Bike   Level -- 2 -- 3 3   Watts --  25 -- 25 16   Minutes -- 15 -- 15 15   METs -- 2.73 -- 2.72 2.55     REL-XR   Level -- -- -- -- 2   Minutes -- -- -- -- 15   METs -- -- -- -- 3.9     Home Exercise Plan   Plans to continue exercise at -- -- Home (comment)  walking, staff videos Home (comment)  walking, staff videos Home (comment)  walking, staff videos   Frequency -- -- Add 2 additional days to program exercise sessions. Add 2 additional days to program exercise sessions. Add 2 additional days to program exercise sessions.   Initial Home Exercises Provided -- -- 06/17/21 06/17/21 06/17/21     Oxygen   Maintain Oxygen Saturation -- 88% or higher -- -- 88% or higher  Exercise Comments:   Exercise Comments     Row Name 06/08/21 0932           Exercise Comments First full day of exercise!  Patient was oriented to gym and equipment including functions, settings, policies, and procedures.  Patient's individual exercise prescription and treatment plan were reviewed.  All starting workloads were established based on the results of the 6 minute walk test done at initial orientation visit.  The plan for exercise progression was also introduced and progression will be customized based on patient's performance and goals.                Exercise Goals and Review:   Exercise Goals     Row Name 06/01/21 1055             Exercise Goals   Increase Physical Activity Yes       Intervention Provide advice, education, support and counseling about physical activity/exercise needs.;Develop an individualized exercise prescription for aerobic and resistive training based on initial evaluation findings, risk stratification, comorbidities and participant's personal goals.       Expected Outcomes Short Term: Attend rehab on a regular basis to increase amount of physical activity.;Long Term: Add in home exercise to make exercise part of routine and to increase amount of physical activity.;Long Term: Exercising  regularly at least 3-5 days a week.       Increase Strength and Stamina Yes       Intervention Provide advice, education, support and counseling about physical activity/exercise needs.;Develop an individualized exercise prescription for aerobic and resistive training based on initial evaluation findings, risk stratification, comorbidities and participant's personal goals.       Expected Outcomes Short Term: Increase workloads from initial exercise prescription for resistance, speed, and METs.;Short Term: Perform resistance training exercises routinely during rehab and add in resistance training at home;Long Term: Improve cardiorespiratory fitness, muscular endurance and strength as measured by increased METs and functional capacity (6MWT)       Able to understand and use rate of perceived exertion (RPE) scale Yes       Intervention Provide education and explanation on how to use RPE scale       Expected Outcomes Short Term: Able to use RPE daily in rehab to express subjective intensity level;Long Term:  Able to use RPE to guide intensity level when exercising independently       Able to understand and use Dyspnea scale Yes       Intervention Provide education and explanation on how to use Dyspnea scale       Expected Outcomes Short Term: Able to use Dyspnea scale daily in rehab to express subjective sense of shortness of breath during exertion;Long Term: Able to use Dyspnea scale to guide intensity level when exercising independently       Knowledge and understanding of Target Heart Rate Range (THRR) Yes       Intervention Provide education and explanation of THRR including how the numbers were predicted and where they are located for reference       Expected Outcomes Short Term: Able to state/look up THRR;Long Term: Able to use THRR to govern intensity when exercising independently;Short Term: Able to use daily as guideline for intensity in rehab       Able to check pulse independently Yes        Intervention Provide education and demonstration on how to check pulse in carotid and radial arteries.;Review the importance of being able to check your  own pulse for safety during independent exercise       Expected Outcomes Short Term: Able to explain why pulse checking is important during independent exercise;Long Term: Able to check pulse independently and accurately       Understanding of Exercise Prescription Yes       Intervention Provide education, explanation, and written materials on patient's individual exercise prescription       Expected Outcomes Short Term: Able to explain program exercise prescription;Long Term: Able to explain home exercise prescription to exercise independently                Exercise Goals Re-Evaluation :  Exercise Goals Re-Evaluation     Row Name 06/08/21 0932 06/10/21 1549 06/17/21 0937 06/22/21 1418 07/05/21 1542     Exercise Goal Re-Evaluation   Exercise Goals Review Increase Physical Activity;Able to understand and use rate of perceived exertion (RPE) scale;Knowledge and understanding of Target Heart Rate Range (THRR);Understanding of Exercise Prescription;Increase Strength and Stamina;Able to understand and use Dyspnea scale;Able to check pulse independently Increase Physical Activity;Understanding of Exercise Prescription;Increase Strength and Stamina Increase Physical Activity;Understanding of Exercise Prescription;Increase Strength and Stamina;Able to understand and use rate of perceived exertion (RPE) scale;Able to understand and use Dyspnea scale;Able to check pulse independently;Knowledge and understanding of Target Heart Rate Range (THRR) Increase Physical Activity;Increase Strength and Stamina Increase Physical Activity;Increase Strength and Stamina;Understanding of Exercise Prescription   Comments Reviewed RPE and dyspnea scales, THR and program prescription with pt today.  Pt voiced understanding and was given a copy of goals to take home. Danny Norris  came for the first several sessions of rehab and has done well thus far. Staff will continue to monitor his progress as patient progresses through the program. Danny Norris is doing well in rehab. He is starting to feel like his stamina is starting to recover.  Reviewed home exercise with pt today.  Pt plans to walk at park and use staff videos at home for exercise.  Reviewed THR, pulse, RPE, sign and symptoms, pulse oximetery and when to call 911 or MD.  Also discussed weather considerations and indoor options.  Pt voiced understanding. Danny Norris is tolerating exercise well in his first sessions.  He has rache dhis THR one session.  Staff will review THR range and encourage him to increase levels to get to THR range Danny Norris is doing well in rehab.  He is up to 3.9 METs on the XR.  We will continue to monitor his progress.   Expected Outcomes Short: Use RPE daily to regulate intensity. Long: Follow program prescription in THR. Short: Continue to follow exercise prescription Long: Continue to increase overall MET level Short: Start to add in walking at least one extra day at home Long: continue to improve stamina. Short: work in [employer] range Long:  improve overall stamina Short: Push treadmill back up to 2.3 mph Long: Conitnue to improve stamina            Discharge Exercise Prescription (Final Exercise Prescription Changes):  Exercise Prescription Changes - 07/05/21 1500       Response to Exercise   Blood Pressure (Admit) 138/68    Blood Pressure (Exercise) 146/82    Blood Pressure (Exit) 124/70    Heart Rate (Admit) 69 bpm    Heart Rate (Exercise) 84 bpm    Heart Rate (Exit) 77 bpm    Oxygen Saturation (Admit) 94 %    Oxygen Saturation (Exercise) 92 %    Oxygen Saturation (Exit) 91 %  Rating of Perceived Exertion (Exercise) 12    Perceived Dyspnea (Exercise) 2    Symptoms SOB    Duration Continue with 30 min of aerobic exercise without signs/symptoms of physical distress.    Intensity THRR unchanged       Progression   Progression Continue to progress workloads to maintain intensity without signs/symptoms of physical distress.    Average METs 3.08      Resistance Training   Training Prescription Yes    Weight 4 lb    Reps 10-15      Interval Training   Interval Training No      Oxygen   Oxygen Continuous    Liters 2      Treadmill   MPH 2.3    Grade 0.5    Minutes 15    METs 2.92      Recumbant Bike   Level 3    Watts 16    Minutes 15    METs 2.55      REL-XR   Level 2    Minutes 15    METs 3.9      Home Exercise Plan   Plans to continue exercise at Home (comment)   walking, staff videos   Frequency Add 2 additional days to program exercise sessions.    Initial Home Exercises Provided 06/17/21      Oxygen   Maintain Oxygen Saturation 88% or higher             Nutrition:  Target Goals: Understanding of nutrition guidelines, daily intake of sodium <1595m, cholesterol <2062m calories 30% from fat and 7% or less from saturated fats, daily to have 5 or more servings of fruits and vegetables.  Education: All About Nutrition: -Group instruction provided by verbal, written material, interactive activities, discussions, models, and posters to present general guidelines for heart healthy nutrition including fat, fiber, MyPlate, the role of sodium in heart healthy nutrition, utilization of the nutrition label, and utilization of this knowledge for meal planning. Follow up email sent as well. Written material given at graduation. [facility]  Education need identified 06/01/21  Date 06/24/21  Educator MCLunaInstruction Review Code 1- Verbalizes Understanding       Biometrics:  Pre Biometrics - 06/01/21 0944       Pre Biometrics   Height 6' (1.829 m)    Weight 238 lb 9.6 oz (108.2 kg)    BMI (Calculated) 32.35    Single Leg Stand 3.93 seconds              Nutrition Therapy Plan and  Nutrition Goals:  Nutrition Therapy & Goals - 06/01/21 0955       Intervention Plan   Intervention Prescribe, educate and counsel regarding individualized specific dietary modifications aiming towards targeted core components such as weight, hypertension, lipid management, diabetes, heart failure and other comorbidities.    Expected Outcomes Short Term Goal: Understand basic principles of dietary content, such as calories, fat, sodium, cholesterol and nutrients.;Short Term Goal: A plan has been developed with personal nutrition goals set during dietitian appointment.;Long Term Goal: Adherence to prescribed nutrition plan.             Nutrition Assessments:  MEDIFICTS Score Key: ?70 Need to make dietary changes  40-70 Heart Healthy Diet ? 40 Therapeutic Level Cholesterol Diet  [facility]  Picture Your Plate Total Score on  Admission 49      Picture Your Plate Scores: <37 Unhealthy dietary pattern with much room for improvement. 41-50 Dietary pattern unlikely to meet recommendations for good health and room for improvement. 51-60 More healthful dietary pattern, with some room for improvement.  >60 Healthy dietary pattern, although there may be some specific behaviors that could be improved.   Nutrition Goals Re-Evaluation:  Nutrition Goals Re-Evaluation     [place] Name 06/17/21 0941             Goals   Nutrition Goal Meet with dietitian       Comment Danny Norris is doing well in rehab.  He needs to meet with dietitian yet.  He is already working on low salt.  We did talk about making sure he gets enough protein and enough fruits and vegetables.       Expected Outcome Short: Meet dietitian Long: Add in more protein and balance                Nutrition Goals Discharge (Final Nutrition Goals Re-Evaluation):  Nutrition Goals Re-Evaluation - 06/17/21 0941       Goals   Nutrition Goal Meet with dietitian     Comment Danny Norris is doing well in rehab.  He needs to meet with dietitian yet.  He is already working on low salt.  We did talk about making sure he gets enough protein and enough fruits and vegetables.    Expected Outcome Short: Meet dietitian Long: Add in more protein and balance             Psychosocial: Target Goals: Acknowledge presence or absence of significant depression and/or stress, maximize coping skills, provide positive support system. Participant is able to verbalize types and ability to use techniques and skills needed for reducing stress and depression.   Education: Stress, Anxiety, and Depression - Group verbal and visual presentation to define topics covered.  Reviews how body is impacted by stress, anxiety, and depression.  Also discusses healthy ways to reduce stress and to treat/manage anxiety and depression.  Written material given at graduation.   Education: Sleep Hygiene -Provides group verbal and written instruction about how sleep can affect your health.  Define sleep hygiene, discuss sleep cycles and impact of sleep habits. Review good sleep hygiene tips.    Initial Review & Psychosocial Screening:  Initial Psych Review & Screening - 05/21/21 1509       Initial Review   Current issues with Current Sleep Concerns      Family Dynamics   Good Support System? Yes      Barriers   Psychosocial barriers to participate in program There are no identifiable barriers or psychosocial needs.;The patient should benefit from training in stress management and relaxation.      Screening Interventions   Interventions Encouraged to exercise;Provide feedback about the scores to participant;To provide support and resources with identified psychosocial needs    Expected Outcomes Short Term goal: Utilizing psychosocial counselor, staff and physician to assist with identification of specific Stressors or current issues interfering with healing process. Setting desired goal for each  stressor or current issue identified.;Long Term Goal: Stressors or current issues are controlled or eliminated.;Short Term goal: Identification and review with participant of any Quality of Life or Depression concerns found by scoring the questionnaire.;Long Term goal: The participant improves quality of Life and PHQ9 Scores as seen by post scores and/or verbalization of changes  Quality of Life Scores:  Scores of 19 and below usually indicate a poorer quality of life in these areas.  A difference of  2-3 points is a clinically meaningful difference.  A difference of 2-3 points in the total score of the Quality of Life Index has been associated with significant improvement in overall quality of life, self-image, physical symptoms, and general health in studies assessing change in quality of life.  PHQ-9: Recent Review Flowsheet Data     Depression screen [facility] 2/9 06/29/2021 06/01/2021   Decreased Interest 1 0   Down, Depressed, Hopeless 0 1   PHQ - 2 Score 1 1   Altered sleeping 1 2   Tired, decreased energy 1 1   Change in appetite 0 1   Feeling bad or failure about yourself  0 1   Trouble concentrating 0 1   Moving slowly or fidgety/restless 0 0   Suicidal thoughts 0 0   PHQ-9 Score 3 7   Difficult doing work/chores Somewhat difficult Somewhat difficult      Interpretation of Total Score  Total Score Depression Severity:  1-4 = Minimal depression, 5-9 = Mild depression, 10-14 = Moderate depression, 15-19 = Moderately severe depression, 20-27 = Severe depression   Psychosocial Evaluation and Intervention:  Psychosocial Evaluation - 05/21/21 1526       Psychosocial Evaluation & Interventions   Interventions Encouraged to exercise with the program and follow exercise prescription    Comments Danny Norris breathing has been a concern for him for a while. Thankfully he has been able to wean himself off his oxygen during the day. Currently, he requires 2L at night but is  getting a sleep study soon to determine if he needs CPAP. His sister in law is involved in his care. He plans on going back to work Sept 19 so he isn't sure what his schedule will look like.    Expected Outcomes Short: attend pulmonary rehab for education and exercise. Long: develop and maintain positive self care habits.    Continue Psychosocial Services  Follow up required by staff             Psychosocial Re-Evaluation:  Psychosocial Re-Evaluation     [place] Name 06/17/21 0939 06/29/21 0925           Psychosocial Re-Evaluation   Current issues with Current Sleep Concerns Current Stress Concerns;Current Depression      Comments Danny Norris is doing well in rehab.  He is generally a positive guy and tries not to let anything stress him out too much.  He is a light sleeper and has always been.  He does have a hard time getting back to sleep.  He has had a sleep study in the past and is going to get a CPAP ordered. Danny Norris is planning to return to work this weekend and will keep an eye on his oxygen levels. Reviewed patient health questionnaire (PHQ-9) with patient for follow up. Previously, patients score indicated signs/symptoms of depression.  Reviewed to see if patient is improving symptom wise while in program.  Score improved and patient states that it is because they have been able to get out more and feeling better overall.      Expected Outcomes Short: Get CPAP to help with sleep Long: Continue to focus on positive. Short: Continue to attend [facility] regularly for regular exercise and social engagement. Long: Continue to improve symptoms and manage a positive mental state.      Interventions Encouraged to attend  Pulmonary Rehabilitation for the exercise Encouraged to attend Pulmonary Rehabilitation for the exercise      Continue Psychosocial Services  Follow up required by staff --               Psychosocial Discharge (Final Psychosocial Re-Evaluation):  Psychosocial Re-Evaluation -  06/29/21 0925       Psychosocial Re-Evaluation   Current issues with Current Stress Concerns;Current Depression    Comments Reviewed patient health questionnaire (PHQ-9) with patient for follow up. Previously, patients score indicated signs/symptoms of depression.  Reviewed to see if patient is improving symptom wise while in program.  Score improved and patient states that it is because they have been able to get out more and feeling better overall.    Expected Outcomes Short: Continue to attend [facility] regularly for regular exercise and social engagement. Long: Continue to improve symptoms and manage a positive mental state.    Interventions Encouraged to attend Pulmonary Rehabilitation for the exercise             Education: Education Goals: Education classes will be provided on a weekly basis, covering required topics. Participant will state understanding/return demonstration of topics presented.  Learning Barriers/Preferences:   General Pulmonary Education Topics:  Infection Prevention: - Provides verbal and written material to individual with discussion of infection control including proper hand washing and proper equipment cleaning during exercise session. Flowsheet Row Pulmonary Rehab from 07/01/2021 in [facility] and Pulmonary Rehab  Education need identified 06/01/21  Date 06/01/21  Educator [place]  Instruction Review Code 1- Verbalizes Understanding       Falls Prevention: - Provides verbal and written material to individual with discussion of falls prevention and safety. Flowsheet Row Pulmonary Rehab from 07/01/2021 in [facility] and Pulmonary Rehab  Education need identified 06/01/21  Date 06/01/21  Educator [place]  Instruction Review Code 1- Verbalizes Understanding       Chronic Lung Disease Review: - Group verbal instruction with posters, models, PowerPoint presentations and videos,  to review new updates, new respiratory medications, new advancements in  procedures and treatments. Providing information on websites and "800" numbers for continued self-education. Includes information about supplement oxygen, available portable oxygen systems, continuous and intermittent flow rates, oxygen safety, concentrators, and Medicare reimbursement for oxygen. Explanation of Pulmonary Drugs, including class, frequency, complications, importance of spacers, rinsing mouth after steroid MDI's, and proper cleaning methods for nebulizers. Review of basic lung anatomy and physiology related to function, structure, and complications of lung disease. Review of risk factors. Discussion about methods for diagnosing sleep apnea and types of masks and machines for OSA. Includes a review of the use of types of environmental controls: home humidity, furnaces, filters, dust mite/pet prevention, HEPA vacuums. Discussion about weather changes, air quality and the benefits of nasal washing. Instruction on Warning signs, infection symptoms, calling MD promptly, preventive modes, and value of vaccinations. Review of effective airway clearance, coughing and/or vibration techniques. Emphasizing that all should Create an Action Plan. Written material given at graduation. Flowsheet Row Pulmonary Rehab from 07/01/2021 in [facility] and Pulmonary Rehab  Education need identified 06/01/21       AED/CPR: - Group verbal and written instruction with the use of models to demonstrate the basic use of the AED with the basic ABC's of resuscitation.    Anatomy and Cardiac Procedures: - Group verbal and visual presentation and models provide information about basic cardiac anatomy and function. Reviews the testing methods done to diagnose heart disease and the outcomes of  the test results. Describes the treatment choices: Medical Management, Angioplasty, or Coronary Bypass Surgery for treating various heart conditions including Myocardial Infarction, Angina, Valve Disease, and Cardiac Arrhythmias.   Written material given at graduation.   Medication Safety: - Group verbal and visual instruction to review commonly prescribed medications for heart and lung disease. Reviews the medication, class of the drug, and side effects. Includes the steps to properly store meds and maintain the prescription regimen.  Written material given at graduation. Flowsheet Row Pulmonary Rehab from 07/01/2021 in [facility] Card[facility] and Pulmonary Rehab  Date 06/17/21  Educator [place]  Instruction Review Code 1- Verbalizes Understanding       Other: -Provides group and verbal instruction on various topics (see comments)   Knowledge Questionnaire Score:  Knowledge Questionnaire Score - 06/01/21 0955       Knowledge Questionnaire Score   Pre Score 13/18: ADL, O2, PLB              Core Components/Risk Factors/Patient Goals at Admission:  Personal Goals and Risk Factors at Admission - 06/01/21 1056       Core Components/Risk Factors/Patient Goals on Admission    Weight Management Yes;Weight Loss    Intervention Weight Management: Develop a combined nutrition and exercise program designed to reach desired caloric intake, while maintaining appropriate intake of nutrient and fiber, sodium and fats, and appropriate energy expenditure required for the weight goal.;Weight Management: Provide education and appropriate resources to help participant work on and attain dietary goals.;Weight Management/Obesity: Establish reasonable short term and long term weight goals.    Admit Weight 238 lb (108 kg)    Goal Weight: Short Term 233 lb (105.7 kg)    Goal Weight: Long Term 220 lb (99.8 kg)    Expected Outcomes Short Term: Continue to assess and modify interventions until short term weight is achieved;Long Term: Adherence to nutrition and physical activity/exercise program aimed toward attainment of established weight goal;Weight Loss: Understanding of general recommendations for a balanced deficit meal plan, which promotes  1-2 lb weight loss per week and includes a negative energy balance of [phone] kcal/d;Understanding recommendations for meals to include 15-35% energy as protein, 25-35% energy from fat, 35-60% energy from carbohydrates, less than 268m of dietary cholesterol, 20-35 gm of total fiber daily;Understanding of distribution of calorie intake throughout the day with the consumption of 4-5 meals/snacks    Improve shortness of breath with ADL's Yes    Intervention Provide education, individualized exercise plan and daily activity instruction to help decrease symptoms of SOB with activities of daily living.    Expected Outcomes Short Term: Improve cardiorespiratory fitness to achieve a reduction of symptoms when performing ADLs;Long Term: Be able to perform more ADLs without symptoms or delay the onset of symptoms    Hypertension Yes    Intervention Monitor prescription use compliance.;Provide education on lifestyle modifcations including regular physical activity/exercise, weight management, moderate sodium restriction and increased consumption of fresh fruit, vegetables, and low fat dairy, alcohol moderation, and smoking cessation.    Expected Outcomes Short Term: Continued assessment and intervention until BP is < 140/976mHG in hypertensive participants. < 130/8072mG in hypertensive participants with diabetes, heart failure or chronic kidney disease.;Long Term: Maintenance of blood pressure at goal levels.    Lipids Yes    Intervention Provide education and support for participant on nutrition & aerobic/resistive exercise along with prescribed medications to achieve LDL <67m86mDL >40mg47m Expected Outcomes Short Term: Participant states understanding of desired cholesterol values  and is compliant with medications prescribed. Participant is following exercise prescription and nutrition guidelines.;Long Term: Cholesterol controlled with medications as prescribed, with individualized exercise RX and with  personalized nutrition plan. Value goals: LDL < 70m, HDL > 40 mg.             Education:Diabetes - Individual verbal and written instruction to review signs/symptoms of diabetes, desired ranges of glucose level fasting, after meals and with exercise. Acknowledge that pre and post exercise glucose checks will be done for 3 sessions at entry of program.   Know Your Numbers and Heart Failure: - Group verbal and visual instruction to discuss disease risk factors for cardiac and pulmonary disease and treatment options.  Reviews associated critical values for Overweight/Obesity, Hypertension, Cholesterol, and Diabetes.  Discusses basics of heart failure: signs/symptoms and treatments.  Introduces Heart Failure Zone chart for action plan for heart failure.  Written material given at graduation. [facility]  Date 07/01/21  Educator [place]  Instruction Review Code 1- Verbalizes Understanding       Core Components/Risk Factors/Patient Goals Review:   Goals and Risk Factor Review     Row Name 06/17/21 0946             Core Components/Risk Factors/Patient Goals Review   Personal Goals Review Weight Management/Obesity;Increase knowledge of respiratory medications and ability to use respiratory devices properly.;Improve shortness of breath with ADL's;Hypertension;Lipids       Review Danny Norris doing well in rehab.  His weight is still going up and down depending on what he is eating.  His blood pressures have reading higher at home than here by about 30 pts.  His pressures tend to run slightly elevated anyways.  We talked about bringing in his blood pressure cuff. His breathing is improving and he is able to do more.  He is using his inhaler and doing well. He does not have a spacer yet and we will get him one once back in stock.       Expected Outcomes Short: Get spacer for inhaler and bring in cuff  Long: Continue to work on weight  loss                Core Components/Risk Factors/Patient Goals at Discharge (Final Review):   Goals and Risk Factor Review - 06/17/21 0946       Core Components/Risk Factors/Patient Goals Review   Personal Goals Review Weight Management/Obesity;Increase knowledge of respiratory medications and ability to use respiratory devices properly.;Improve shortness of breath with ADL's;Hypertension;Lipids    Review PDailyis doing well in rehab.  His weight is still going up and down depending on what he is eating.  His blood pressures have reading higher at home than here by about 30 pts.  His pressures tend to run slightly elevated anyways.  We talked about bringing in his blood pressure cuff. His breathing is improving and he is able to do more.  He is using his inhaler and doing well. He does not have a spacer yet and we will get him one once back in stock.    Expected Outcomes Short: Get spacer for inhaler and bring in cuff  Long: Continue to work on weight loss             ITP Comments:  ITP Comments     Row Name 05/21/21 1520 06/01/21 0940 06/08/21 0932 06/09/21 0607 07/07/21 0819   ITP Comments Initial telephone orientation completed.  Diagnosis can be found in [facility] 7/25. EP orientation scheduled for Tuesday 8/30 at 8 am. Completed 6MWT and gym orientation. Initial ITP created and sent for review to Dr. Ottie Glazier, Medical Director. First full day of exercise!  Patient was oriented to gym and equipment including functions, settings, policies, and procedures.  Patient's individual exercise prescription and treatment plan were reviewed.  All starting workloads were established based on the results of the 6 minute walk test done at initial orientation visit.  The plan for exercise progression was also introduced and progression will be customized based on patient's performance and goals. 30 Day review completed. Medical Director ITP review done, changes made as directed, and signed approval by  Medical Director.   New 30 day review completed. ITP sent to Dr. Zetta Bills, Medical Director of Pulmonary Rehab. Continue with ITP unless changes are made by physician.            Comments: 30 day review

## 2021-07-08 ENCOUNTER — Other Ambulatory Visit: Payer: Self-pay

## 2021-07-08 DIAGNOSIS — J9601 Acute respiratory failure with hypoxia: Secondary | ICD-10-CM

## 2021-07-08 NOTE — Progress Notes (Signed)
Daily Session Note  Patient Details  Name: Danny Norris MRN: [id number] Date of Birth: Dec 03, 1960 Referring Provider:   Flowsheet Row Pulmonary Rehab from 06/01/2021 in [facility] Cardiac and Pulmonary Rehab  Referring Provider [name], MD       Encounter Date: 07/08/2021  Check In:  Session Check In - 07/08/21 0914       Check-In   Supervising physician immediately available to respond to emergencies See telemetry face sheet for immediately available ER MD    Location [facility]-Cardiac & Pulmonary Rehab    Staff Present Birdie Sons, MPA, RN;Jessica Luan Pulling, MA, RCEP, CCRP, CCET;Amanda Sommer, BA, ACSM CEP, Exercise Physiologist    Virtual Visit No    Medication changes reported     No    Fall or balance concerns reported    No    Warm-up and Cool-down Performed on first and last piece of equipment    Resistance Training Performed Yes    VAD Patient? No    PAD/SET Patient? No      Pain Assessment   Currently in Pain? No/denies                Social History   Tobacco Use  Smoking Status Never  Smokeless Tobacco Never    Goals Met:  Independence with exercise equipment Exercise tolerated well No report of concerns or symptoms today Strength training completed today  Goals Unmet:  Not Applicable  Comments: Pt able to follow exercise prescription today without complaint.  Will continue to monitor for progression.    Dr. Emily Filbert is Medical Director for [place].  Dr. Ottie Glazier is Medical Director for [facility] Pulmonary Rehabilitation.

## 2021-07-13 ENCOUNTER — Other Ambulatory Visit: Payer: Self-pay

## 2021-07-13 DIAGNOSIS — J9601 Acute respiratory failure with hypoxia: Secondary | ICD-10-CM | POA: Diagnosis not present

## 2021-07-13 NOTE — Progress Notes (Signed)
Daily Session Note  Patient Details  Name: Danny Norris MRN: [id number] Date of Birth: 1961/08/09 Referring Provider:   Flowsheet Row Pulmonary Rehab from 06/01/2021 in [facility] Cardiac and Pulmonary Rehab  Referring Provider [name], MD       Encounter Date: 07/13/2021  Check In:  Session Check In - 07/13/21 0912       Check-In   Supervising physician immediately available to respond to emergencies See telemetry face sheet for immediately available ER MD    Location [facility]-Cardiac & Pulmonary Rehab    Staff Present Birdie Sons, MPA, RN;Jessica Luan Pulling, MA, RCEP, CCRP, CCET;Amanda Sommer, BA, ACSM CEP, Exercise Physiologist    Virtual Visit No    Medication changes reported     No    Fall or balance concerns reported    No    Warm-up and Cool-down Performed on first and last piece of equipment    Resistance Training Performed Yes    VAD Patient? No    PAD/SET Patient? No      Pain Assessment   Currently in Pain? No/denies                Social History   Tobacco Use  Smoking Status Never  Smokeless Tobacco Never    Goals Met:  Independence with exercise equipment Exercise tolerated well No report of concerns or symptoms today Strength training completed today  Goals Unmet:  Not Applicable  Comments: Pt able to follow exercise prescription today without complaint.  Will continue to monitor for progression.    Dr. Emily Filbert is Medical Director for [place].  Dr. Ottie Glazier is Medical Director for [facility] Pulmonary Rehabilitation.

## 2021-07-20 ENCOUNTER — Other Ambulatory Visit: Payer: Self-pay

## 2021-07-20 DIAGNOSIS — J9601 Acute respiratory failure with hypoxia: Secondary | ICD-10-CM | POA: Diagnosis not present

## 2021-07-20 NOTE — Progress Notes (Signed)
Completed initial consultation 

## 2021-07-20 NOTE — Progress Notes (Signed)
Daily Session Note  Patient Details  Name: Danny Norris MRN: [id number] Date of Birth: May 26, 1961 Referring Provider:   Flowsheet Row Pulmonary Rehab from 06/01/2021 in [facility] Cardiac and Pulmonary Rehab  Referring Provider [name], MD       Encounter Date: 07/20/2021  Check In:  Session Check In - 07/20/21 0935       Check-In   Supervising physician immediately available to respond to emergencies See telemetry face sheet for immediately available ER MD    Location [facility]-Cardiac & Pulmonary Rehab    Staff Present Birdie Sons, MPA, RN;Jessica Luan Pulling, MA, RCEP, CCRP, CCET;Amanda Sommer, BA, ACSM CEP, Exercise Physiologist    Virtual Visit No    Medication changes reported     No    Fall or balance concerns reported    No    Warm-up and Cool-down Performed on first and last piece of equipment    Resistance Training Performed Yes    VAD Patient? No    PAD/SET Patient? No      Pain Assessment   Currently in Pain? No/denies                Social History   Tobacco Use  Smoking Status Never  Smokeless Tobacco Never    Goals Met:  Independence with exercise equipment Exercise tolerated well No report of concerns or symptoms today Strength training completed today  Goals Unmet:  Not Applicable  Comments: Pt able to follow exercise prescription today without complaint.  Will continue to monitor for progression.    Dr. Emily Filbert is Medical Director for [place].  Dr. Ottie Glazier is Medical Director for [facility] Pulmonary Rehabilitation.

## 2021-07-22 ENCOUNTER — Other Ambulatory Visit: Payer: Self-pay

## 2021-07-22 DIAGNOSIS — J9601 Acute respiratory failure with hypoxia: Secondary | ICD-10-CM

## 2021-07-22 NOTE — Progress Notes (Signed)
Daily Session Note  Patient Details  Name: Danny Norris MRN: [id number] Date of Birth: 1961/08/02 Referring Provider:   Flowsheet Row Pulmonary Rehab from 06/01/2021 in [facility] Cardiac and Pulmonary Rehab  Referring Provider [name], MD       Encounter Date: 07/22/2021  Check In:  Session Check In - 07/22/21 0957       Check-In   Supervising physician immediately available to respond to emergencies See telemetry face sheet for immediately available ER MD    Location [facility]-Cardiac & Pulmonary Rehab    Staff Present Birdie Sons, MPA, RN;Jessica [place], MA, RCEP, CCRP, CCET;Amanda Sommer, BA, ACSM CEP, Exercise Physiologist;Trinitie Mcgirr Amedeo Plenty, BS, ACSM CEP, Exercise Physiologist    Virtual Visit No    Medication changes reported     No    Fall or balance concerns reported    No    Warm-up and Cool-down Performed on first and last piece of equipment    Resistance Training Performed Yes    VAD Patient? No    PAD/SET Patient? No      Pain Assessment   Currently in Pain? No/denies                Social History   Tobacco Use  Smoking Status Never  Smokeless Tobacco Never    Goals Met:  Independence with exercise equipment Exercise tolerated well Personal goals reviewed No report of concerns or symptoms today Strength training completed today  Goals Unmet:  Not Applicable  Comments: Pt able to follow exercise prescription today without complaint.  Will continue to monitor for progression.    Dr. Emily Filbert is Medical Director for [place].  Dr. Ottie Glazier is Medical Director for [facility] Pulmonary Rehabilitation.

## 2021-07-27 ENCOUNTER — Other Ambulatory Visit: Payer: Self-pay

## 2021-07-27 DIAGNOSIS — J9601 Acute respiratory failure with hypoxia: Secondary | ICD-10-CM

## 2021-07-27 NOTE — Progress Notes (Signed)
Daily Session Note  Patient Details  Name: Danny Norris MRN: [id number] Date of Birth: Oct 17, 1960 Referring Provider:   Flowsheet Row Pulmonary Rehab from 06/01/2021 in [facility] Cardiac and Pulmonary Rehab  Referring Provider [name], MD       Encounter Date: 07/27/2021  Check In:  Session Check In - 07/27/21 0946       Check-In   Supervising physician immediately available to respond to emergencies See telemetry face sheet for immediately available ER MD    Location [facility]-Cardiac & Pulmonary Rehab    Staff Present Birdie Sons, MPA, RN;Melissa [place], RDN, Rowe Pavy, BA, ACSM CEP, Exercise Physiologist    Virtual Visit No    Medication changes reported     No    Fall or balance concerns reported    No    Warm-up and Cool-down Performed on first and last piece of equipment    Resistance Training Performed Yes    VAD Patient? No    PAD/SET Patient? No      Pain Assessment   Currently in Pain? No/denies                Social History   Tobacco Use  Smoking Status Never  Smokeless Tobacco Never    Goals Met:  Independence with exercise equipment Exercise tolerated well No report of concerns or symptoms today Strength training completed today  Goals Unmet:  Not Applicable  Comments: Pt able to follow exercise prescription today without complaint.  Will continue to monitor for progression.    Dr. Emily Filbert is Medical Director for [place].  Dr. Ottie Glazier is Medical Director for [facility] Pulmonary Rehabilitation.

## 2021-07-29 ENCOUNTER — Other Ambulatory Visit: Payer: Self-pay

## 2021-07-29 DIAGNOSIS — J9601 Acute respiratory failure with hypoxia: Secondary | ICD-10-CM

## 2021-07-29 NOTE — Progress Notes (Signed)
Daily Session Note  Patient Details  Name: Danny Norris MRN: [id number] Date of Birth: May 19, 1961 Referring Provider:   Flowsheet Row Pulmonary Rehab from 06/01/2021 in [facility] Cardiac and Pulmonary Rehab  Referring Provider [name], MD       Encounter Date: 07/29/2021  Check In:  Session Check In - 07/29/21 0926       Check-In   Supervising physician immediately available to respond to emergencies See telemetry face sheet for immediately available ER MD    Location [facility]-Cardiac & Pulmonary Rehab    Staff Present Birdie Sons, MPA, Mauricia Area, BS, ACSM CEP, Exercise Physiologist;Joseph Tessie Fass, [place]    Virtual Visit No    Medication changes reported     No    Fall or balance concerns reported    No    Warm-up and Cool-down Performed on first and last piece of equipment    Resistance Training Performed Yes    VAD Patient? No    PAD/SET Patient? No      Pain Assessment   Currently in Pain? No/denies                Social History   Tobacco Use  Smoking Status Never  Smokeless Tobacco Never    Goals Met:  Independence with exercise equipment Exercise tolerated well No report of concerns or symptoms today Strength training completed today  Goals Unmet:  Not Applicable  Comments: Pt able to follow exercise prescription today without complaint.  Will continue to monitor for progression.    Dr. Emily Filbert is Medical Director for [place].  Dr. Ottie Glazier is Medical Director for [facility] Pulmonary Rehabilitation.

## 2021-08-03 ENCOUNTER — Encounter: Payer: BC Managed Care – PPO | Attending: Pulmonary Disease

## 2021-08-03 ENCOUNTER — Other Ambulatory Visit: Payer: Self-pay

## 2021-08-03 DIAGNOSIS — J9601 Acute respiratory failure with hypoxia: Secondary | ICD-10-CM | POA: Insufficient documentation

## 2021-08-03 NOTE — Progress Notes (Signed)
Daily Session Note  Patient Details  Name: Danny Norris MRN: [id number] Date of Birth: 1961/03/30 Referring Provider:   Flowsheet Row Pulmonary Rehab from 06/01/2021 in [facility] Cardiac and Pulmonary Rehab  Referring Provider [name], MD       Encounter Date: 08/03/2021  Check In:  Session Check In - 08/03/21 0920       Check-In   Supervising physician immediately available to respond to emergencies See telemetry face sheet for immediately available ER MD    Location [facility]-Cardiac & Pulmonary Rehab    Staff Present Birdie Sons, MPA, RN;Amanda Sommer, BA, ACSM CEP, Exercise Physiologist;Jessica Luan Pulling, MA, RCEP, CCRP, CCET    Virtual Visit No    Medication changes reported     No    Fall or balance concerns reported    No    Warm-up and Cool-down Performed on first and last piece of equipment    Resistance Training Performed Yes    VAD Patient? No    PAD/SET Patient? No      Pain Assessment   Currently in Pain? No/denies                Social History   Tobacco Use  Smoking Status Never  Smokeless Tobacco Never    Goals Met:  Independence with exercise equipment Exercise tolerated well No report of concerns or symptoms today Strength training completed today  Goals Unmet:  Not Applicable  Comments: Pt able to follow exercise prescription today without complaint.  Will continue to monitor for progression.    Dr. Emily Filbert is Medical Director for [place].  Dr. Ottie Glazier is Medical Director for [facility] Pulmonary Rehabilitation.

## 2021-08-04 ENCOUNTER — Encounter: Payer: Self-pay | Admitting: *Deleted

## 2021-08-04 DIAGNOSIS — J9601 Acute respiratory failure with hypoxia: Secondary | ICD-10-CM

## 2021-08-04 NOTE — Progress Notes (Signed)
Pulmonary Individual Treatment Plan  Patient Details  Name: Danny Norris MRN: [id number] Date of Birth: [birth year] Referring Provider:   Flowsheet Row Pulmonary Rehab from 06/01/2021 in [facility] Cardiac and Pulmonary Rehab  Referring Provider [name], MD       Initial Encounter Date:  Flowsheet Row Pulmonary Rehab from 06/01/2021 in [facility] Cardiac and Pulmonary Rehab  Date 06/01/21       Visit Diagnosis: Acute respiratory failure with hypoxia ([place])  Patient's Home Medications on Admission:  Current Outpatient Medications:    apixaban (ELIQUIS) 5 MG TABS tablet, Apixaban 10 mg po bid x 3 days, than 5 mg po bid, Disp: 66 tablet, Rfl: 1   atorvastatin (LIPITOR) 20 MG tablet, Take 1 tablet (20 mg total) by mouth daily at 6 PM., Disp: 30 tablet, Rfl: 6   docusate sodium (COLACE) 100 MG capsule, Take 1 capsule (100 mg total) by mouth 2 (two) times daily., Disp: 10 capsule, Rfl: 0   feeding supplement (ENSURE ENLIVE / ENSURE PLUS) LIQD, Take 237 mLs by mouth 2 (two) times daily between meals., Disp: 237 mL, Rfl: 12   hydrochlorothiazide (HYDRODIURIL) 25 MG tablet, TAKE 1 TABLET BY MOUTH EVERY DAY (Patient taking differently: Take 25 mg by mouth daily.), Disp: 90 tablet, Rfl: 1   metoprolol succinate (TOPROL-XL) 50 MG 24 hr tablet, TAKE 1 TABLET BY MOUTH DAILY WITH OR IMMEDIATELY FOLLOWING A MEAL (Patient taking differently: Take 50 mg by mouth daily at 6 (six) AM.), Disp: 90 tablet, Rfl: 1   polyethylene glycol (MIRALAX / GLYCOLAX) 17 g packet, Take 17 g by mouth daily., Disp: 14 each, Rfl: 0  Past Medical History: Past Medical History:  Diagnosis Date   Dyslipidemia    HTN (hypertension)    Mitral valve regurgitation    a. 05/2011: TEE showing significant MVP especially involving the P2 segment without evidence for ruptured chordae and had eccentric MR anteriorly directed. b. 03/2015: echo showing EF 65-70% with posterior MV leaflet thickening and prolapse with mild MR.   Normal  coronary arteries    a. normal cors by cath in 08/2011    Tobacco Use: Social History   Tobacco Use  Smoking Status Never  Smokeless Tobacco Never    Labs: Recent Review Flowsheet Data     Labs for ITP Cardiac and Pulmonary Rehab Latest Ref Rng & Units 08/08/2019 12/24/2019 02/19/2021 02/20/2021 02/21/2021   Cholestrol 100 - 199 mg/dL 167 132 - - -   LDLCALC 0 - 99 mg/dL 110(H) 79 - - -   HDL >39 mg/dL 40 37(L) - - -   Trlycerides <150 mg/dL 89 81 - 93 -   Hemoglobin A1c 4.8 - 5.6 % - - 6.3(H) - -   PHART 7.350 - 7.450 - - 7.383 7.576(H) 7.463(H)   PCO2ART 32.0 - 48.0 mmHg - - 57.5(H) 31.0(L) 41.2   HCO3 20.0 - 28.0 mmol/L - - 34.0(H) 28.8(H) 29.5(H)   TCO2 22 - 32 mmol/L - - 36(H) - 31   ACIDBASEDEF 0.0 - 2.0 mmol/L - - - - -   O2SAT % - - 99.0 99.2 92.0        Pulmonary Assessment Scores:  Pulmonary Assessment Scores     Row Name 06/01/21 0950         ADL UCSD   ADL Phase Entry     SOB Score total 18     Rest 0     Walk 2     Stairs 1  Bath 0     Dress 0     Shop 1       CAT Score   CAT Score 7       mMRC Score   mMRC Score 1              UCSD: Self-administered rating of dyspnea associated with activities of daily living (ADLs) 6-point scale (0 = "not at all" to 5 = "maximal or unable to do because of breathlessness")  Scoring Scores range from 0 to 120.  Minimally important difference is 5 units  CAT: CAT can identify the health impairment of COPD patients and is better correlated with disease progression.  CAT has a scoring range of zero to 40. The CAT score is classified into four groups of low (less than 10), medium (10 - 20), high (21-30) and very high (31-40) based on the impact level of disease on health status. A CAT score over 10 suggests significant symptoms.  A worsening CAT score could be explained by an exacerbation, poor medication adherence, poor inhaler technique, or progression of COPD or comorbid conditions.  CAT MCID is 2  points  mMRC: mMRC (Modified Medical Research Council) Dyspnea Scale is used to assess the degree of baseline functional disability in patients of respiratory disease due to dyspnea. No minimal important difference is established. A decrease in score of 1 point or greater is considered a positive change.   Pulmonary Function Assessment:   Exercise Target Goals: Exercise Program Goal: Individual exercise prescription set using results from initial 6 min walk test and THRR while considering  patient's activity barriers and safety.   Exercise Prescription Goal: Initial exercise prescription builds to 30-45 minutes a day of aerobic activity, 2-3 days per week.  Home exercise guidelines will be given to patient during program as part of exercise prescription that the participant will acknowledge.  Education: Aerobic Exercise: - Group verbal and visual presentation on the components of exercise prescription. Introduces F.I.T.T principle from ACSM for exercise prescriptions.  Reviews F.I.T.T. principles of aerobic exercise including progression. Written material given at graduation. Flowsheet Row Pulmonary Rehab from 07/29/2021 in [facility] Cardiac and Pulmonary Rehab  Date 07/29/21  Educator [facility]  Instruction Review Code 1- Verbalizes Understanding       Education: Resistance Exercise: - Group verbal and visual presentation on the components of exercise prescription. Introduces F.I.T.T principle from ACSM for exercise prescriptions  Reviews F.I.T.T. principles of resistance exercise including progression. Written material given at graduation.    Education: Exercise & Equipment Safety: - Individual verbal instruction and demonstration of equipment use and safety with use of the equipment. Flowsheet Row Pulmonary Rehab from 07/29/2021 in [facility] Cardiac and Pulmonary Rehab  Education need identified 06/01/21  Date 06/01/21  Educator [facility]  Instruction Review Code 1- Verbalizes Understanding        Education: Exercise Physiology & General Exercise Guidelines: - Group verbal and written instruction with models to review the exercise physiology of the cardiovascular system and associated critical values. Provides general exercise guidelines with specific guidelines to those with heart or lung disease.  Flowsheet Row Pulmonary Rehab from 07/29/2021 in [facility] Cardiac and Pulmonary Rehab  Date 07/22/21  Educator [facility]  Instruction Review Code 1- Verbalizes Understanding       Education: Flexibility, Balance, Mind/Body Relaxation: - Group verbal and visual presentation with interactive activity on the components of exercise prescription. Introduces F.I.T.T principle from ACSM for exercise prescriptions. Reviews F.I.T.T. principles of flexibility and balance exercise training  including progression. Also discusses the mind body connection.  Reviews various relaxation techniques to help reduce and manage stress (i.e. Deep breathing, progressive muscle relaxation, and visualization). Balance handout provided to take home. Written material given at graduation. Flowsheet Row Pulmonary Rehab from 07/29/2021 in [facility] Cardiac and Pulmonary Rehab  Date 06/10/21  Educator [facility]  Instruction Review Code 1- Verbalizes Understanding       Activity Barriers & Risk Stratification:  Activity Barriers & Cardiac Risk Stratification - 06/01/21 0944       Activity Barriers & Cardiac Risk Stratification   Activity Barriers Shortness of Breath;Joint Problems;Deconditioning             6 Minute Walk:  6 Minute Walk     Row Name 06/01/21 0947         6 Minute Walk   Phase Initial     Distance 1385 feet     Walk Time 6 minutes     # of Rest Breaks 0     MPH 2.62     METS 3.73     RPE 13     Perceived Dyspnea  2     VO2 Peak 13.07     Symptoms Yes (comment)     Comments SOB     Resting HR 62 bpm     Resting BP 140/82     Resting Oxygen Saturation  96 %     Exercise Oxygen Saturation  during  6 min walk 91 %     Max Ex. HR 106 bpm     Max Ex. BP 166/84     2 Minute Post BP 144/84       Interval HR   1 Minute HR 79     2 Minute HR 96     3 Minute HR 101     4 Minute HR 103     5 Minute HR 106     6 Minute HR 105     2 Minute Post HR 67     Interval Heart Rate? Yes       Interval Oxygen   Interval Oxygen? Yes     Baseline Oxygen Saturation % 96 %     1 Minute Oxygen Saturation % 93 %     1 Minute Liters of Oxygen 2 L     2 Minute Oxygen Saturation % 92 %     2 Minute Liters of Oxygen 2 L     3 Minute Oxygen Saturation % 93 %     3 Minute Liters of Oxygen 2 L     4 Minute Oxygen Saturation % 92 %     4 Minute Liters of Oxygen 2 L     5 Minute Oxygen Saturation % 91 %     5 Minute Liters of Oxygen 2 L     6 Minute Oxygen Saturation % 92 %     6 Minute Liters of Oxygen 2 L     2 Minute Post Oxygen Saturation % 94 %     2 Minute Post Liters of Oxygen 2 L             Oxygen Initial Assessment:  Oxygen Initial Assessment - 06/01/21 0949       Home Oxygen   Home Oxygen Device E-Tanks    Sleep Oxygen Prescription Continuous    Liters per minute 2    Home Exercise Oxygen Prescription None    Home Resting Oxygen Prescription None  Compliance with Home Oxygen Use Yes      Initial 6 min Walk   Oxygen Used Continuous    Liters per minute 2      Program Oxygen Prescription   Program Oxygen Prescription Continuous    Liters per minute 2      Intervention   Short Term Goals To learn and understand importance of monitoring SPO2 with pulse oximeter and demonstrate accurate use of the pulse oximeter.;To learn and understand importance of maintaining oxygen saturations>88%;To learn and exhibit compliance with exercise, home and travel O2 prescription;To learn and demonstrate proper pursed lip breathing techniques or other breathing techniques. ;To learn and demonstrate proper use of respiratory medications    Long  Term Goals Exhibits compliance with exercise, home   and travel O2 prescription;Verbalizes importance of monitoring SPO2 with pulse oximeter and return demonstration;Maintenance of O2 saturations>88%;Compliance with respiratory medication;Exhibits proper breathing techniques, such as pursed lip breathing or other method taught during program session;Demonstrates proper use of MDI's             Oxygen Re-Evaluation:  Oxygen Re-Evaluation     Row Name 06/08/21 0933 06/17/21 0950 07/22/21 0956         Program Oxygen Prescription   Program Oxygen Prescription Continuous Continuous Continuous;E-Tanks     Liters per minute 2 2 2        Home Oxygen   Home Oxygen Device E-Tanks E-Tanks E-Tanks     Sleep Oxygen Prescription Continuous Continuous;CPAP Continuous;CPAP     Liters per minute 2 2  recommended 2     Home Exercise Oxygen Prescription None None  not currently prescribed None     Home Resting Oxygen Prescription None None None     Compliance with Home Oxygen Use Yes Yes Yes       Goals/Expected Outcomes   Short Term Goals To learn and understand importance of monitoring SPO2 with pulse oximeter and demonstrate accurate use of the pulse oximeter.;To learn and understand importance of maintaining oxygen saturations>88%;To learn and exhibit compliance with exercise, home and travel O2 prescription;To learn and demonstrate proper pursed lip breathing techniques or other breathing techniques.  To learn and understand importance of monitoring SPO2 with pulse oximeter and demonstrate accurate use of the pulse oximeter.;To learn and understand importance of maintaining oxygen saturations>88%;To learn and exhibit compliance with exercise, home and travel O2 prescription;To learn and demonstrate proper pursed lip breathing techniques or other breathing techniques.  To learn and understand importance of monitoring SPO2 with pulse oximeter and demonstrate accurate use of the pulse oximeter.;To learn and understand importance of maintaining oxygen  saturations>88%;To learn and exhibit compliance with exercise, home and travel O2 prescription;To learn and demonstrate proper pursed lip breathing techniques or other breathing techniques.      Long  Term Goals Exhibits compliance with exercise, home  and travel O2 prescription;Verbalizes importance of monitoring SPO2 with pulse oximeter and return demonstration;Maintenance of O2 saturations>88%;Exhibits proper breathing techniques, such as pursed lip breathing or other method taught during program session;Compliance with respiratory medication Exhibits compliance with exercise, home  and travel O2 prescription;Verbalizes importance of monitoring SPO2 with pulse oximeter and return demonstration;Maintenance of O2 saturations>88%;Exhibits proper breathing techniques, such as pursed lip breathing or other method taught during program session;Compliance with respiratory medication Exhibits compliance with exercise, home  and travel O2 prescription;Verbalizes importance of monitoring SPO2 with pulse oximeter and return demonstration;Maintenance of O2 saturations>88%;Exhibits proper breathing techniques, such as pursed lip breathing or other method taught during program session;Compliance  with respiratory medication     Comments Reviewed PLB technique with pt.  Talked about how it works and it's importance in maintaining their exercise saturations. Danny Norris is doing well with his oxygen in class and wearing it at night.  He has been prescribed a CPAP but has not gotten it yet.  We talked about monitoring his oxygen closely as he goes back to work this week to see if he needs oxygen while he is working.  He is planning to take his pulse oximeter with him.  Overall, he is doing well and uses his inhaler regularly.  He does not have a spacer but we will get him one soon. Danny Norris is doing well in rehab. He is doing well with his oxygen and CPAP.  He has gotten a new mask for his CPAP and now has  a better seal with it.  He is  doing well with his pursed lip breathing too.     Goals/Expected Outcomes Short: Become more profiecient at using PLB.   Long: Become independent at using PLB. Short: Monitor oxygen levels at work Long:Continue to improve SOB and using PLB Short: Continue to use new mask Long: Conitnued compliance.              Oxygen Discharge (Final Oxygen Re-Evaluation):  Oxygen Re-Evaluation - 07/22/21 0956       Program Oxygen Prescription   Program Oxygen Prescription Continuous;E-Tanks    Liters per minute 2      Home Oxygen   Home Oxygen Device E-Tanks    Sleep Oxygen Prescription Continuous;CPAP    Liters per minute 2    Home Exercise Oxygen Prescription None    Home Resting Oxygen Prescription None    Compliance with Home Oxygen Use Yes      Goals/Expected Outcomes   Short Term Goals To learn and understand importance of monitoring SPO2 with pulse oximeter and demonstrate accurate use of the pulse oximeter.;To learn and understand importance of maintaining oxygen saturations>88%;To learn and exhibit compliance with exercise, home and travel O2 prescription;To learn and demonstrate proper pursed lip breathing techniques or other breathing techniques.     Long  Term Goals Exhibits compliance with exercise, home  and travel O2 prescription;Verbalizes importance of monitoring SPO2 with pulse oximeter and return demonstration;Maintenance of O2 saturations>88%;Exhibits proper breathing techniques, such as pursed lip breathing or other method taught during program session;Compliance with respiratory medication    Comments Danny Norris is doing well in rehab. He is doing well with his oxygen and CPAP.  He has gotten a new mask for his CPAP and now has  a better seal with it.  He is doing well with his pursed lip breathing too.    Goals/Expected Outcomes Short: Continue to use new mask Long: Conitnued compliance.             Initial Exercise Prescription:  Initial Exercise Prescription - 06/01/21 1000        Date of Initial Exercise RX and Referring Provider   Date 06/01/21    Referring Provider [name], MD      Oxygen   Oxygen Continuous    Liters 2    Maintain Oxygen Saturation 88% or higher      Treadmill   MPH 2.3    Grade 0.5    Minutes 15    METs 2.92      Recumbant Bike   Level 2    RPM 60    Watts 30    Minutes  15    METs 3.7      REL-XR   Level 2    Speed 50    Minutes 15    METs 3.7      Prescription Details   Frequency (times per week) 2    Duration Progress to 30 minutes of continuous aerobic without signs/symptoms of physical distress      Intensity   THRR 40-80% of Max Heartrate 101-141    Ratings of Perceived Exertion 11-13    Perceived Dyspnea 0-4      Progression   Progression Continue to progress workloads to maintain intensity without signs/symptoms of physical distress.      Resistance Training   Training Prescription Yes    Weight 4 lb    Reps 10-15             Perform Capillary Blood Glucose checks as needed.  Exercise Prescription Changes:   Exercise Prescription Changes     Row Name 06/01/21 1000 06/10/21 1500 06/17/21 0900 06/22/21 1400 07/05/21 1500     Response to Exercise   Blood Pressure (Admit) 140/82 122/68 -- 126/64 138/68   Blood Pressure (Exercise) 166/84 172/78 -- 120/64 146/82   Blood Pressure (Exit) 144/84 124/78 -- 114/66 124/70   Heart Rate (Admit) 62 bpm 68 bpm -- 74 bpm 69 bpm   Heart Rate (Exercise) 106 bpm 92 bpm -- 105 bpm 84 bpm   Heart Rate (Exit) 67 bpm 76 bpm -- 90 bpm 77 bpm   Oxygen Saturation (Admit) 96 % 94 % -- 91 % 94 %   Oxygen Saturation (Exercise) 91 % 93 % -- 93 % 92 %   Oxygen Saturation (Exit) 94 % 94 % -- 94 % 91 %   Rating of Perceived Exertion (Exercise) 13 12 -- 12 12   Perceived Dyspnea (Exercise) 2 2 -- 2 2   Symptoms SOB SOB -- -- SOB   Comments walk test results first full day exercise -- -- --   Duration -- Progress to 30 minutes of  aerobic without signs/symptoms  of physical distress -- Continue with 30 min of aerobic exercise without signs/symptoms of physical distress. Continue with 30 min of aerobic exercise without signs/symptoms of physical distress.   Intensity -- THRR unchanged -- THRR unchanged THRR unchanged     Progression   Progression -- Continue to progress workloads to maintain intensity without signs/symptoms of physical distress. -- Continue to progress workloads to maintain intensity without signs/symptoms of physical distress. Continue to progress workloads to maintain intensity without signs/symptoms of physical distress.   Average METs -- 2.82 -- 2.82 3.08     Resistance Training   Training Prescription -- Yes -- Yes Yes   Weight -- 4 lb -- 4 lb 4 lb   Reps -- 10-15 -- 10-15 10-15     Interval Training   Interval Training -- -- -- -- No     Oxygen   Oxygen -- Continuous -- Continuous Continuous   Liters -- 2 -- 2 2     Treadmill   MPH -- 2.3 -- 2.3 2.3   Grade -- 0.5 -- 0.5 0.5   Minutes -- 15 -- 15 15   METs -- 2.92 -- 2.92 2.92     Recumbant Bike   Level -- 2 -- 3 3   Watts -- 25 -- 25 16   Minutes -- 15 -- 15 15   METs -- 2.73 -- 2.72 2.55     REL-XR  Level -- -- -- -- 2   Minutes -- -- -- -- 15   METs -- -- -- -- 3.9     Home Exercise Plan   Plans to continue exercise at -- -- Home (comment)  walking, staff videos Home (comment)  walking, staff videos Home (comment)  walking, staff videos   Frequency -- -- Add 2 additional days to program exercise sessions. Add 2 additional days to program exercise sessions. Add 2 additional days to program exercise sessions.   Initial Home Exercises Provided -- -- 06/17/21 06/17/21 06/17/21     Oxygen   Maintain Oxygen Saturation -- 88% or higher -- -- 88% or higher    Row Name 08/02/21 1000             Response to Exercise   Blood Pressure (Admit) 124/62       Blood Pressure (Exercise) 172/62       Blood Pressure (Exit) 128/72       Heart Rate (Admit) 75 bpm        Heart Rate (Exercise) 109 bpm       Heart Rate (Exit) 85 bpm       Oxygen Saturation (Admit) 92 %       Oxygen Saturation (Exercise) 93 %       Oxygen Saturation (Exit) 93 %       Rating of Perceived Exertion (Exercise) 13       Perceived Dyspnea (Exercise) 2       Symptoms SOB       Duration Continue with 30 min of aerobic exercise without signs/symptoms of physical distress.       Intensity THRR unchanged         Progression   Progression Continue to progress workloads to maintain intensity without signs/symptoms of physical distress.       Average METs 4.03         Resistance Training   Training Prescription Yes       Weight 4 lb       Reps 10-15         Interval Training   Interval Training No         Oxygen   Oxygen Continuous       Liters 2         Treadmill   MPH 2.9       Grade 4       Minutes 15       METs 4.82         Recumbant Bike   Level 7       Minutes 15       METs 2.71         REL-XR   Level 3       Minutes 15       METs 4.75         Home Exercise Plan   Plans to continue exercise at Home (comment)  walking, staff videos       Frequency Add 2 additional days to program exercise sessions.       Initial Home Exercises Provided 06/17/21         Oxygen   Maintain Oxygen Saturation 88% or higher                Exercise Comments:   Exercise Comments     Row Name 06/08/21 0932           Exercise Comments First full day of exercise!  Patient  was oriented to gym and equipment including functions, settings, policies, and procedures.  Patient's individual exercise prescription and treatment plan were reviewed.  All starting workloads were established based on the results of the 6 minute walk test done at initial orientation visit.  The plan for exercise progression was also introduced and progression will be customized based on patient's performance and goals.                Exercise Goals and Review:   Exercise Goals     Row Name  06/01/21 1055             Exercise Goals   Increase Physical Activity Yes       Intervention Provide advice, education, support and counseling about physical activity/exercise needs.;Develop an individualized exercise prescription for aerobic and resistive training based on initial evaluation findings, risk stratification, comorbidities and participant's personal goals.       Expected Outcomes Short Term: Attend rehab on a regular basis to increase amount of physical activity.;Long Term: Add in home exercise to make exercise part of routine and to increase amount of physical activity.;Long Term: Exercising regularly at least 3-5 days a week.       Increase Strength and Stamina Yes       Intervention Provide advice, education, support and counseling about physical activity/exercise needs.;Develop an individualized exercise prescription for aerobic and resistive training based on initial evaluation findings, risk stratification, comorbidities and participant's personal goals.       Expected Outcomes Short Term: Increase workloads from initial exercise prescription for resistance, speed, and METs.;Short Term: Perform resistance training exercises routinely during rehab and add in resistance training at home;Long Term: Improve cardiorespiratory fitness, muscular endurance and strength as measured by increased METs and functional capacity (6MWT)       Able to understand and use rate of perceived exertion (RPE) scale Yes       Intervention Provide education and explanation on how to use RPE scale       Expected Outcomes Short Term: Able to use RPE daily in rehab to express subjective intensity level;Long Term:  Able to use RPE to guide intensity level when exercising independently       Able to understand and use Dyspnea scale Yes       Intervention Provide education and explanation on how to use Dyspnea scale       Expected Outcomes Short Term: Able to use Dyspnea scale daily in rehab to express  subjective sense of shortness of breath during exertion;Long Term: Able to use Dyspnea scale to guide intensity level when exercising independently       Knowledge and understanding of Target Heart Rate Range (THRR) Yes       Intervention Provide education and explanation of THRR including how the numbers were predicted and where they are located for reference       Expected Outcomes Short Term: Able to state/look up THRR;Long Term: Able to use THRR to govern intensity when exercising independently;Short Term: Able to use daily as guideline for intensity in rehab       Able to check pulse independently Yes       Intervention Provide education and demonstration on how to check pulse in carotid and radial arteries.;Review the importance of being able to check your own pulse for safety during independent exercise       Expected Outcomes Short Term: Able to explain why pulse checking is important during independent exercise;Long Term: Able to check pulse  independently and accurately       Understanding of Exercise Prescription Yes       Intervention Provide education, explanation, and written materials on patient's individual exercise prescription       Expected Outcomes Short Term: Able to explain program exercise prescription;Long Term: Able to explain home exercise prescription to exercise independently                Exercise Goals Re-Evaluation :  Exercise Goals Re-Evaluation     Row Name 06/08/21 0932 06/10/21 1549 06/17/21 0937 06/22/21 1418 07/05/21 1542     Exercise Goal Re-Evaluation   Exercise Goals Review Increase Physical Activity;Able to understand and use rate of perceived exertion (RPE) scale;Knowledge and understanding of Target Heart Rate Range (THRR);Understanding of Exercise Prescription;Increase Strength and Stamina;Able to understand and use Dyspnea scale;Able to check pulse independently Increase Physical Activity;Understanding of Exercise Prescription;Increase Strength and  Stamina Increase Physical Activity;Understanding of Exercise Prescription;Increase Strength and Stamina;Able to understand and use rate of perceived exertion (RPE) scale;Able to understand and use Dyspnea scale;Able to check pulse independently;Knowledge and understanding of Target Heart Rate Range (THRR) Increase Physical Activity;Increase Strength and Stamina Increase Physical Activity;Increase Strength and Stamina;Understanding of Exercise Prescription   Comments Reviewed RPE and dyspnea scales, THR and program prescription with pt today.  Pt voiced understanding and was given a copy of goals to take home. Jamani came for the first several sessions of rehab and has done well thus far. Staff will continue to monitor his progress as patient progresses through the program. Danny Norris is doing well in rehab. He is starting to feel like his stamina is starting to recover.  Reviewed home exercise with pt today.  Pt plans to walk at park and use staff videos at home for exercise.  Reviewed THR, pulse, RPE, sign and symptoms, pulse oximetery and when to call 911 or MD.  Also discussed weather considerations and indoor options.  Pt voiced understanding. Danny Norris is tolerating exercise well in his first sessions.  He has rache dhis THR one session.  Staff will review THR range and encourage him to increase levels to get to THR range Danny Norris is doing well in rehab.  He is up to 3.9 METs on the XR.  We will continue to monitor his progress.   Expected Outcomes Short: Use RPE daily to regulate intensity. Long: Follow program prescription in THR. Short: Continue to follow exercise prescription Long: Continue to increase overall MET level Short: Start to add in walking at least one extra day at home Long: continue to improve stamina. Short: work in [employer] range Long:  improve overall stamina Short: Push treadmill back up to 2.3 mph Long: Conitnue to improve stamina    Row Name 07/22/21 0950 08/02/21 1032           Exercise Goal  Re-Evaluation   Exercise Goals Review Increase Physical Activity;Increase Strength and Stamina;Understanding of Exercise Prescription Increase Physical Activity;Increase Strength and Stamina      Comments Danny Norris is doing well in rehab. He does get out at least 2 extra days to walk each week.  He would like ot do three days, but usually will find something comes up or weather doesn't work. He is feeling better with his strength and stamina and getting stronger. Danny Norris continues to do well in rehab. He has significantly increased his loads on the treadmill to 2.9 speed with a 4% incline and tolerating it well. His recumbant bike has increased to level 7. Will continue to  monitor his progression.      Expected Outcomes Short: Add in more exercise at home.  Long: Continue to improve stamina. Short: Continue to work within [place]: Continue to increase overall MET level               Discharge Exercise Prescription (Final Exercise Prescription Changes):  Exercise Prescription Changes - 08/02/21 1000       Response to Exercise   Blood Pressure (Admit) 124/62    Blood Pressure (Exercise) 172/62    Blood Pressure (Exit) 128/72    Heart Rate (Admit) 75 bpm    Heart Rate (Exercise) 109 bpm    Heart Rate (Exit) 85 bpm    Oxygen Saturation (Admit) 92 %    Oxygen Saturation (Exercise) 93 %    Oxygen Saturation (Exit) 93 %    Rating of Perceived Exertion (Exercise) 13    Perceived Dyspnea (Exercise) 2    Symptoms SOB    Duration Continue with 30 min of aerobic exercise without signs/symptoms of physical distress.    Intensity THRR unchanged      Progression   Progression Continue to progress workloads to maintain intensity without signs/symptoms of physical distress.    Average METs 4.03      Resistance Training   Training Prescription Yes    Weight 4 lb    Reps 10-15      Interval Training   Interval Training No      Oxygen   Oxygen Continuous    Liters 2      Treadmill   MPH 2.9     Grade 4    Minutes 15    METs 4.82      Recumbant Bike   Level 7    Minutes 15    METs 2.71      REL-XR   Level 3    Minutes 15    METs 4.75      Home Exercise Plan   Plans to continue exercise at Home (comment)   walking, staff videos   Frequency Add 2 additional days to program exercise sessions.    Initial Home Exercises Provided 06/17/21      Oxygen   Maintain Oxygen Saturation 88% or higher             Nutrition:  Target Goals: Understanding of nutrition guidelines, daily intake of sodium <1548m, cholesterol <2055m calories 30% from fat and 7% or less from saturated fats, daily to have 5 or more servings of fruits and vegetables.  Education: All About Nutrition: -Group instruction provided by verbal, written material, interactive activities, discussions, models, and posters to present general guidelines for heart healthy nutrition including fat, fiber, MyPlate, the role of sodium in heart healthy nutrition, utilization of the nutrition label, and utilization of this knowledge for meal planning. Follow up email sent as well. Written material given at graduation. Flowsheet Row [facility]  Education need identified 06/01/21  Date 06/24/21  Educator [place] Code 1- Verbalizes Understanding       Biometrics:  Pre Biometrics - 06/01/21 0944       Pre Biometrics   Height 6' (1.829 m)    Weight 238 lb 9.6 oz (108.2 kg)    BMI (Calculated) 32.35    Single Leg Stand 3.93 seconds              Nutrition Therapy Plan and Nutrition Goals:  Nutrition Therapy & Goals -  07/20/21 0845       Nutrition Therapy   Diet Heart healthy, low Na, pulmonary friendly    Protein (specify units) 85-90g    Fiber 30 grams    Whole Grain Foods 3 servings    Saturated Fats 12 max. grams    Fruits and Vegetables 8 servings/day    Sodium 1.5 grams      Personal Nutrition Goals   Nutrition Goal ST: bring  lunch to work (PB and Jelly on whole wheat, tuna salad with whole wheat crackers, vegetable and chicken on whole wheat) LT: limit saturated fat to <12g/day, limit Na to <2g/day, eat a variety of 8 fruits/vegetables per day, eat out <2 days per week.    Comments B: bacon and eggs 2x/week or cereal (grapenuts, fiber one with whole milk) L: at work: chicken salad sandwich with chips and fruit D: varies: hamburger, chicken with (brown) rice, quinoa, barley, potatoes with a salad and nonstarchy vegetables. She uses some olive oil with cooking and applesauce with baking. Drinks: coffee (french vanilla creamer), herbal tea, water. No shortness of breath with meals. His sister in-law Levada Dy was enthusiastic and spoke frequently during the consultation; sister in-law is a Engineer, maintenance and was inputting additional education and suggestions during consultation. Reviewed heart healthy eating from nutrition education and pulmonary MNT. Danny Norris would like to first work on his lunch and would like to start bringing lunch to work.      Intervention Plan   Intervention Prescribe, educate and counsel regarding individualized specific dietary modifications aiming towards targeted core components such as weight, hypertension, lipid management, diabetes, heart failure and other comorbidities.    Expected Outcomes Short Term Goal: Understand basic principles of dietary content, such as calories, fat, sodium, cholesterol and nutrients.;Short Term Goal: A plan has been developed with personal nutrition goals set during dietitian appointment.;Long Term Goal: Adherence to prescribed nutrition plan.             Nutrition Assessments:  MEDIFICTS Score Key: ?70 Need to make dietary changes  40-70 Heart Healthy Diet ? 40 Therapeutic Level Cholesterol Diet  Flowsheet Row Pulmonary Rehab from 06/01/2021 in [facility] Card[facility] and Pulmonary Rehab  Picture Your Plate Total Score on Admission 49      Picture Your Plate Scores: <50  Unhealthy dietary pattern with much room for improvement. 41-50 Dietary pattern unlikely to meet recommendations for good health and room for improvement. 51-60 More healthful dietary pattern, with some room for improvement.  >60 Healthy dietary pattern, although there may be some specific behaviors that could be improved.   Nutrition Goals Re-Evaluation:  Nutrition Goals Re-Evaluation     [place] Name 06/17/21 0941             Goals   Nutrition Goal Meet with dietitian       Comment Danny Norris is doing well in rehab.  He needs to meet with dietitian yet.  He is already working on low salt.  We did talk about making sure he gets enough protein and enough fruits and vegetables.       Expected Outcome Short: Meet dietitian Long: Add in more protein and balance                Nutrition Goals Discharge (Final Nutrition Goals Re-Evaluation):  Nutrition Goals Re-Evaluation - 06/17/21 0941       Goals   Nutrition Goal Meet with dietitian    Comment Danny Norris is doing well in rehab.  He needs to meet with  dietitian yet.  He is already working on low salt.  We did talk about making sure he gets enough protein and enough fruits and vegetables.    Expected Outcome Short: Meet dietitian Long: Add in more protein and balance             Psychosocial: Target Goals: Acknowledge presence or absence of significant depression and/or stress, maximize coping skills, provide positive support system. Participant is able to verbalize types and ability to use techniques and skills needed for reducing stress and depression.   Education: Stress, Anxiety, and Depression - Group verbal and visual presentation to define topics covered.  Reviews how body is impacted by stress, anxiety, and depression.  Also discusses healthy ways to reduce stress and to treat/manage anxiety and depression.  Written material given at graduation.   Education: Sleep Hygiene -Provides group verbal and written instruction about how  sleep can affect your health.  Define sleep hygiene, discuss sleep cycles and impact of sleep habits. Review good sleep hygiene tips.    Initial Review & Psychosocial Screening:  Initial Psych Review & Screening - 05/21/21 1509       Initial Review   Current issues with Current Sleep Concerns      Family Dynamics   Good Support System? Yes      Barriers   Psychosocial barriers to participate in program There are no identifiable barriers or psychosocial needs.;The patient should benefit from training in stress management and relaxation.      Screening Interventions   Interventions Encouraged to exercise;Provide feedback about the scores to participant;To provide support and resources with identified psychosocial needs    Expected Outcomes Short Term goal: Utilizing psychosocial counselor, staff and physician to assist with identification of specific Stressors or current issues interfering with healing process. Setting desired goal for each stressor or current issue identified.;Long Term Goal: Stressors or current issues are controlled or eliminated.;Short Term goal: Identification and review with participant of any Quality of Life or Depression concerns found by scoring the questionnaire.;Long Term goal: The participant improves quality of Life and PHQ9 Scores as seen by post scores and/or verbalization of changes             Quality of Life Scores:  Scores of 19 and below usually indicate a poorer quality of life in these areas.  A difference of  2-3 points is a clinically meaningful difference.  A difference of 2-3 points in the total score of the Quality of Life Index has been associated with significant improvement in overall quality of life, self-image, physical symptoms, and general health in studies assessing change in quality of life.  PHQ-9: Recent Review Flowsheet Data     Depression screen [facility] 2/9 06/29/2021 06/01/2021   Decreased Interest 1 0   Down, Depressed, Hopeless 0  1   PHQ - 2 Score 1 1   Altered sleeping 1 2   Tired, decreased energy 1 1   Change in appetite 0 1   Feeling bad or failure about yourself  0 1   Trouble concentrating 0 1   Moving slowly or fidgety/restless 0 0   Suicidal thoughts 0 0   PHQ-9 Score 3 7   Difficult doing work/chores Somewhat difficult Somewhat difficult      Interpretation of Total Score  Total Score Depression Severity:  1-4 = Minimal depression, 5-9 = Mild depression, 10-14 = Moderate depression, 15-19 = Moderately severe depression, 20-27 = Severe depression   Psychosocial Evaluation and Intervention:  Psychosocial Evaluation - 05/21/21 1526       Psychosocial Evaluation & Interventions   Interventions Encouraged to exercise with the program and follow exercise prescription    Comments Mr. Deerman breathing has been a concern for him for a while. Thankfully he has been able to wean himself off his oxygen during the day. Currently, he requires 2L at night but is getting a sleep study soon to determine if he needs CPAP. His sister in law is involved in his care. He plans on going back to work Sept 19 so he isn't sure what his schedule will look like.    Expected Outcomes Short: attend pulmonary rehab for education and exercise. Long: develop and maintain positive self care habits.    Continue Psychosocial Services  Follow up required by staff             Psychosocial Re-Evaluation:  Psychosocial Re-Evaluation     [place] Name 06/17/21 [phone] 06/29/21 0925 07/22/21 0951         Psychosocial Re-Evaluation   Current issues with Current Sleep Concerns Current Stress Concerns;Current Depression Current Stress Concerns;Current Depression     Comments Danny Norris is doing well in rehab.  He is generally a positive guy and tries not to let anything stress him out too much.  He is a light sleeper and has always been.  He does have a hard time getting back to sleep.  He has had a sleep study in the past and is going to get a  CPAP ordered. Danny Norris is planning to return to work this weekend and will keep an eye on his oxygen levels. Reviewed patient health questionnaire (PHQ-9) with patient for follow up. Previously, patients score indicated signs/symptoms of depression.  Reviewed to see if patient is improving symptom wise while in program.  Score improved and patient states that it is because they have been able to get out more and feeling better overall. Danny Norris is doing well in rehab.  He denies any major stressors currenty. He has not had as much depression symptoms recently, a few days here and there.  He is still sleeping well and enjoying coming to class.     Expected Outcomes Short: Get CPAP to help with sleep Long: Continue to focus on positive. Short: Continue to attend [facility] regularly for regular exercise and social engagement. Long: Continue to improve symptoms and manage a positive mental state. Short: Continue to exercise for mental boost Long: Continue to stay positve     Interventions Encouraged to attend Pulmonary Rehabilitation for the exercise Encouraged to attend Pulmonary Rehabilitation for the exercise Encouraged to attend Pulmonary Rehabilitation for the exercise     Continue Psychosocial Services  Follow up required by staff -- --              Psychosocial Discharge (Final Psychosocial Re-Evaluation):  Psychosocial Re-Evaluation - 07/22/21 0951       Psychosocial Re-Evaluation   Current issues with Current Stress Concerns;Current Depression    Comments Danny Norris is doing well in rehab.  He denies any major stressors currenty. He has not had as much depression symptoms recently, a few days here and there.  He is still sleeping well and enjoying coming to class.    Expected Outcomes Short: Continue to exercise for mental boost Long: Continue to stay positve    Interventions Encouraged to attend Pulmonary Rehabilitation for the exercise             Education: Education Goals: Education classes  will be provided on a weekly basis, covering required topics. Participant will state understanding/return demonstration of topics presented.  Learning Barriers/Preferences:   General Pulmonary Education Topics:  Infection Prevention: - Provides verbal and written material to individual with discussion of infection control including proper hand washing and proper equipment cleaning during exercise session. Flowsheet Row Pulmonary Rehab from 07/29/2021 in [facility] Cardiac and Pulmonary Rehab  Education need identified 06/01/21  Date 06/01/21  Educator [place]  Instruction Review Code 1- Verbalizes Understanding       Falls Prevention: - Provides verbal and written material to individual with discussion of falls prevention and safety. Flowsheet Row Pulmonary Rehab from 07/29/2021 in [facility] Cardiac and Pulmonary Rehab  Education need identified 06/01/21  Date 06/01/21  Educator [place]  Instruction Review Code 1- Verbalizes Understanding       Chronic Lung Disease Review: - Group verbal instruction with posters, models, PowerPoint presentations and videos,  to review new updates, new respiratory medications, new advancements in procedures and treatments. Providing information on websites and "800" numbers for continued self-education. Includes information about supplement oxygen, available portable oxygen systems, continuous and intermittent flow rates, oxygen safety, concentrators, and Medicare reimbursement for oxygen. Explanation of Pulmonary Drugs, including class, frequency, complications, importance of spacers, rinsing mouth after steroid MDI's, and proper cleaning methods for nebulizers. Review of basic lung anatomy and physiology related to function, structure, and complications of lung disease. Review of risk factors. Discussion about methods for diagnosing sleep apnea and types of masks and machines for OSA. Includes a review of the use of types of environmental controls: home humidity, furnaces,  filters, dust mite/pet prevention, HEPA vacuums. Discussion about weather changes, air quality and the benefits of nasal washing. Instruction on Warning signs, infection symptoms, calling MD promptly, preventive modes, and value of vaccinations. Review of effective airway clearance, coughing and/or vibration techniques. Emphasizing that all should Create an Action Plan. Written material given at graduation. Flowsheet Row Pulmonary Rehab from 07/29/2021 in [facility] Cardiac and Pulmonary Rehab  Education need identified 06/01/21  Date 07/08/21  Educator [facility]  Instruction Review Code 1- Verbalizes Understanding       AED/CPR: - Group verbal and written instruction with the use of models to demonstrate the basic use of the AED with the basic ABC's of resuscitation.    Anatomy and Cardiac Procedures: - Group verbal and visual presentation and models provide information about basic cardiac anatomy and function. Reviews the testing methods done to diagnose heart disease and the outcomes of the test results. Describes the treatment choices: Medical Management, Angioplasty, or Coronary Bypass Surgery for treating various heart conditions including Myocardial Infarction, Angina, Valve Disease, and Cardiac Arrhythmias.  Written material given at graduation.   Medication Safety: - Group verbal and visual instruction to review commonly prescribed medications for heart and lung disease. Reviews the medication, class of the drug, and side effects. Includes the steps to properly store meds and maintain the prescription regimen.  Written material given at graduation. Flowsheet Row Pulmonary Rehab from 07/29/2021 in [facility] Cardiac and Pulmonary Rehab  Date 06/17/21  Educator [place]  Instruction Review Code 1- Verbalizes Understanding       Other: -Provides group and verbal instruction on various topics (see comments)   Knowledge Questionnaire Score:  Knowledge Questionnaire Score - 06/01/21 0955        Knowledge Questionnaire Score   Pre Score 13/18: ADL, O2, PLB              Core Components/Risk Factors/Patient Goals  at Admission:  Personal Goals and Risk Factors at Admission - 06/01/21 1056       Core Components/Risk Factors/Patient Goals on Admission    Weight Management Yes;Weight Loss    Intervention Weight Management: Develop a combined nutrition and exercise program designed to reach desired caloric intake, while maintaining appropriate intake of nutrient and fiber, sodium and fats, and appropriate energy expenditure required for the weight goal.;Weight Management: Provide education and appropriate resources to help participant work on and attain dietary goals.;Weight Management/Obesity: Establish reasonable short term and long term weight goals.    Admit Weight 238 lb (108 kg)    Goal Weight: Short Term 233 lb (105.7 kg)    Goal Weight: Long Term 220 lb (99.8 kg)    Expected Outcomes Short Term: Continue to assess and modify interventions until short term weight is achieved;Long Term: Adherence to nutrition and physical activity/exercise program aimed toward attainment of established weight goal;Weight Loss: Understanding of general recommendations for a balanced deficit meal plan, which promotes 1-2 lb weight loss per week and includes a negative energy balance of [phone] kcal/d;Understanding recommendations for meals to include 15-35% energy as protein, 25-35% energy from fat, 35-60% energy from carbohydrates, less than 254m of dietary cholesterol, 20-35 gm of total fiber daily;Understanding of distribution of calorie intake throughout the day with the consumption of 4-5 meals/snacks    Improve shortness of breath with ADL's Yes    Intervention Provide education, individualized exercise plan and daily activity instruction to help decrease symptoms of SOB with activities of daily living.    Expected Outcomes Short Term: Improve cardiorespiratory fitness to achieve a reduction of  symptoms when performing ADLs;Long Term: Be able to perform more ADLs without symptoms or delay the onset of symptoms    Hypertension Yes    Intervention Monitor prescription use compliance.;Provide education on lifestyle modifcations including regular physical activity/exercise, weight management, moderate sodium restriction and increased consumption of fresh fruit, vegetables, and low fat dairy, alcohol moderation, and smoking cessation.    Expected Outcomes Short Term: Continued assessment and intervention until BP is < 140/971mHG in hypertensive participants. < 130/8066mG in hypertensive participants with diabetes, heart failure or chronic kidney disease.;Long Term: Maintenance of blood pressure at goal levels.    Lipids Yes    Intervention Provide education and support for participant on nutrition & aerobic/resistive exercise along with prescribed medications to achieve LDL <39m7mDL >40mg62m Expected Outcomes Short Term: Participant states understanding of desired cholesterol values and is compliant with medications prescribed. Participant is following exercise prescription and nutrition guidelines.;Long Term: Cholesterol controlled with medications as prescribed, with individualized exercise RX and with personalized nutrition plan. Value goals: LDL < 39mg,17m > 40 mg.             Education:Diabetes - Individual verbal and written instruction to review signs/symptoms of diabetes, desired ranges of glucose level fasting, after meals and with exercise. Acknowledge that pre and post exercise glucose checks will be done for 3 sessions at entry of program.   Know Your Numbers and Heart Failure: - Group verbal and visual instruction to discuss disease risk factors for cardiac and pulmonary disease and treatment options.  Reviews associated critical values for Overweight/Obesity, Hypertension, Cholesterol, and Diabetes.  Discusses basics of heart failure: signs/symptoms and treatments.   Introduces Heart Failure Zone chart for action plan for heart failure.  Written material given at graduation. Flowsheet Row [facility]monary [facility]  Date 07/01/21  Educator KB  Instruction Review Code 1- Verbalizes Understanding       Core Components/Risk Factors/Patient Goals Review:   Goals and Risk Factor Review     Row Name 06/17/21 0946 07/22/21 0953           Core Components/Risk Factors/Patient Goals Review   Personal Goals Review Weight Management/Obesity;Increase knowledge of respiratory medications and ability to use respiratory devices properly.;Improve shortness of breath with ADL's;Hypertension;Lipids Weight Management/Obesity;Increase knowledge of respiratory medications and ability to use respiratory devices properly.;Improve shortness of breath with ADL's;Hypertension;Lipids      Review Kamari is doing well in rehab.  His weight is still going up and down depending on what he is eating.  His blood pressures have reading higher at home than here by about 30 pts.  His pressures tend to run slightly elevated anyways.  We talked about bringing in his blood pressure cuff. His breathing is improving and he is able to do more.  He is using his inhaler and doing well. He does not have a spacer yet and we will get him one once back in stock. Danny Norris is doing well in rehab.  His weight is trending up again and he admits to needing to buckle down on his diet.  He just met with dietian this week, so he is hoping that will help some.  He is doing well  with inhalers and meds.  His breathing is doing better.  Pressures have continued to do well. He did bring in his cuff and decided he needed a new one.      Expected Outcomes Short: Get spacer for inhaler and bring in cuff  Long: Continue to work on weight loss SHort: Continue to use inhaler when needed Long: Continue to work on weight loss               Core Components/Risk Factors/Patient Goals at  Discharge (Final Review):   Goals and Risk Factor Review - 07/22/21 0953       Core Components/Risk Factors/Patient Goals Review   Personal Goals Review Weight Management/Obesity;Increase knowledge of respiratory medications and ability to use respiratory devices properly.;Improve shortness of breath with ADL's;Hypertension;Lipids    Review Danny Norris is doing well in rehab.  His weight is trending up again and he admits to needing to buckle down on his diet.  He just met with dietian this week, so he is hoping that will help some.  He is doing well  with inhalers and meds.  His breathing is doing better.  Pressures have continued to do well. He did bring in his cuff and decided he needed a new one.    Expected Outcomes SHort: Continue to use inhaler when needed Long: Continue to work on weight loss             ITP Comments:  ITP Comments     Row Name 05/21/21 1520 06/01/21 0940 06/08/21 0932 06/09/21 0607 07/07/21 0819   ITP Comments Initial telephone orientation completed. Diagnosis can be found in [facility] 7/25. EP orientation scheduled for Tuesday 8/30 at 8 am. Completed 6MWT and gym orientation. Initial ITP created and sent for review to Dr. Ottie Glazier, Medical Director. First full day of exercise!  Patient was oriented to gym and equipment including functions, settings, policies, and procedures.  Patient's individual exercise prescription and treatment plan were reviewed.  All starting workloads were established based on the results of the 6 minute walk test done at initial orientation visit.  The plan for exercise progression was also introduced and progression will be customized based on patient's performance and goals. 30 Day review completed. Medical Director ITP review done, changes made as directed, and signed approval by Medical Director.   New 30 day review completed. ITP sent to Dr. Zetta Bills, Medical Director of Pulmonary Rehab. Continue with ITP unless changes are made by physician.     [place] Name 07/20/21 0949 08/04/21 0644         ITP Comments Completed initial RD consultation 30 Day review completed. Medical Director ITP review done, changes made as directed, and signed approval by Medical Director.               Comments:  30 Day review completed. Medical Director ITP review done, changes made as directed, and signed approval by Medical Director.

## 2021-08-05 ENCOUNTER — Encounter: Payer: BC Managed Care – PPO | Admitting: *Deleted

## 2021-08-05 ENCOUNTER — Other Ambulatory Visit: Payer: Self-pay

## 2021-08-05 DIAGNOSIS — J9601 Acute respiratory failure with hypoxia: Secondary | ICD-10-CM | POA: Diagnosis not present

## 2021-08-05 NOTE — Progress Notes (Signed)
Daily Session Note  Patient Details  Name: Danny Norris MRN: [id number] Date of Birth: May 11, 1961 Referring Provider:   Flowsheet Row Pulmonary Rehab from 06/01/2021 in [facility] Cardiac and Pulmonary Rehab  Referring Provider [name], MD       Encounter Date: 08/05/2021  Check In:  Session Check In - 08/05/21 0952       Check-In   Supervising physician immediately available to respond to emergencies See telemetry face sheet for immediately available ER MD    Location [facility]-Cardiac & Pulmonary Rehab    Staff Present Nyoka Cowden, RN, BSN, Tyna Jaksch, MS, ASCM CEP, Exercise Physiologist;Amanda Oletta Darter, [place], ACSM CEP, Exercise Physiologist    Virtual Visit No    Medication changes reported     No    Fall or balance concerns reported    No    Tobacco Cessation No Change    Warm-up and Cool-down Performed on first and last piece of equipment    Resistance Training Performed Yes    VAD Patient? No      Pain Assessment   Currently in Pain? No/denies                Social History   Tobacco Use  Smoking Status Never  Smokeless Tobacco Never    Goals Met:  Independence with exercise equipment Exercise tolerated well No report of concerns or symptoms today  Goals Unmet:  Not Applicable  Comments: Pt able to follow exercise prescription today without complaint.  Will continue to monitor for progression.    Dr. Emily Filbert is Medical Director for [place].  Dr. Ottie Glazier is Medical Director for [facility] Pulmonary Rehabilitation.

## 2021-08-10 ENCOUNTER — Other Ambulatory Visit: Payer: Self-pay

## 2021-08-10 DIAGNOSIS — J9601 Acute respiratory failure with hypoxia: Secondary | ICD-10-CM

## 2021-08-10 NOTE — Progress Notes (Signed)
Daily Session Note  Patient Details  Name: Danny Norris MRN: [id number] Date of Birth: 1961-06-27 Referring Provider:   Flowsheet Row Pulmonary Rehab from 06/01/2021 in [facility] Cardiac and Pulmonary Rehab  Referring Provider [name], MD       Encounter Date: 08/10/2021  Check In:  Session Check In - 08/10/21 0934       Check-In   Supervising physician immediately available to respond to emergencies See telemetry face sheet for immediately available ER MD    Location [facility]-Cardiac & Pulmonary Rehab    Staff Present Birdie Sons, MPA, RN;Amanda Oletta Darter, BA, ACSM CEP, Exercise Physiologist;Melissa Caiola, RDN, LDN    Virtual Visit No    Medication changes reported     No    Fall or balance concerns reported    No    Tobacco Cessation No Change    Warm-up and Cool-down Performed on first and last piece of equipment    Resistance Training Performed Yes    VAD Patient? No    PAD/SET Patient? No      Pain Assessment   Currently in Pain? No/denies                Social History   Tobacco Use  Smoking Status Never  Smokeless Tobacco Never    Goals Met:  Independence with exercise equipment Exercise tolerated well No report of concerns or symptoms today Strength training completed today  Goals Unmet:  Not Applicable  Comments: Pt able to follow exercise prescription today without complaint.  Will continue to monitor for progression.    Dr. Emily Filbert is Medical Director for [place].  Dr. Ottie Glazier is Medical Director for [facility] Pulmonary Rehabilitation.

## 2021-08-12 ENCOUNTER — Other Ambulatory Visit: Payer: Self-pay

## 2021-08-12 DIAGNOSIS — J9601 Acute respiratory failure with hypoxia: Secondary | ICD-10-CM

## 2021-08-12 NOTE — Progress Notes (Signed)
Daily Session Note  Patient Details  Name: JARQUIS WALKER MRN: [id number] Date of Birth: 01-05-1961 Referring Provider:   Flowsheet Row Pulmonary Rehab from 06/01/2021 in [facility] Cardiac and Pulmonary Rehab  Referring Provider [name], MD       Encounter Date: 08/12/2021  Check In:  Session Check In - 08/12/21 0912       Check-In   Supervising physician immediately available to respond to emergencies See telemetry face sheet for immediately available ER MD    Location [facility]-Cardiac & Pulmonary Rehab    Staff Present Birdie Sons, MPA, Mauricia Area, BS, ACSM CEP, Exercise Physiologist;Jessica Luan Pulling, MA, RCEP, CCRP, CCET    Virtual Visit No    Medication changes reported     No    Fall or balance concerns reported    No    Tobacco Cessation No Change    Warm-up and Cool-down Performed on first and last piece of equipment    Resistance Training Performed Yes    VAD Patient? No    PAD/SET Patient? No      Pain Assessment   Currently in Pain? No/denies                Social History   Tobacco Use  Smoking Status Never  Smokeless Tobacco Never    Goals Met:  Independence with exercise equipment Exercise tolerated well No report of concerns or symptoms today Strength training completed today  Goals Unmet:  Not Applicable  Comments: Pt able to follow exercise prescription today without complaint.  Will continue to monitor for progression.    Dr. Emily Filbert is Medical Director for [place].  Dr. Ottie Glazier is Medical Director for [facility] Pulmonary Rehabilitation.

## 2021-08-17 ENCOUNTER — Other Ambulatory Visit: Payer: Self-pay

## 2021-08-17 DIAGNOSIS — J9601 Acute respiratory failure with hypoxia: Secondary | ICD-10-CM

## 2021-08-17 NOTE — Progress Notes (Signed)
Daily Session Note  Patient Details  Name: Danny Norris MRN: [id number] Date of Birth: 09-Apr-1961 Referring Provider:   Flowsheet Row Pulmonary Rehab from 06/01/2021 in [facility] Cardiac and Pulmonary Rehab  Referring Provider [name], MD       Encounter Date: 08/17/2021  Check In:  Session Check In - 08/17/21 0913       Check-In   Supervising physician immediately available to respond to emergencies See telemetry face sheet for immediately available ER MD    Location [facility]-Cardiac & Pulmonary Rehab    Staff Present Birdie Sons, MPA, RN;Jessica Luan Pulling, MA, RCEP, CCRP, CCET;Amanda Sommer, BA, ACSM CEP, Exercise Physiologist    Virtual Visit No    Medication changes reported     No    Fall or balance concerns reported    No    Tobacco Cessation No Change    Warm-up and Cool-down Performed on first and last piece of equipment    Resistance Training Performed Yes    VAD Patient? No    PAD/SET Patient? No      Pain Assessment   Currently in Pain? No/denies                Social History   Tobacco Use  Smoking Status Never  Smokeless Tobacco Never    Goals Met:  Independence with exercise equipment Exercise tolerated well Personal goals reviewed No report of concerns or symptoms today Strength training completed today  Goals Unmet:  Not Applicable  Comments: Pt able to follow exercise prescription today without complaint.  Will continue to monitor for progression.     Dr. Emily Filbert is Medical Director for [place].  Dr. Ottie Glazier is Medical Director for [facility] Pulmonary Rehabilitation.

## 2021-08-19 ENCOUNTER — Other Ambulatory Visit: Payer: Self-pay

## 2021-08-19 DIAGNOSIS — J9601 Acute respiratory failure with hypoxia: Secondary | ICD-10-CM

## 2021-08-19 NOTE — Progress Notes (Signed)
Daily Session Note  Patient Details  Name: SOU NOHR MRN: [id number] Date of Birth: 10-Apr-1961 Referring Provider:   Flowsheet Row Pulmonary Rehab from 06/01/2021 in [facility] Cardiac and Pulmonary Rehab  Referring Provider [name], MD       Encounter Date: 08/19/2021  Check In:  Session Check In - 08/19/21 0914       Check-In   Supervising physician immediately available to respond to emergencies See telemetry face sheet for immediately available ER MD    Location [facility]-Cardiac & Pulmonary Rehab    Staff Present Birdie Sons, MPA, Nino Glow, MS, ASCM CEP, Exercise Physiologist;Thaddus Mcdowell Amedeo Plenty, BS, ACSM CEP, Exercise Physiologist;Amanda Oletta Darter, BA, ACSM CEP, Exercise Physiologist    Virtual Visit No    Medication changes reported     No    Fall or balance concerns reported    No    Tobacco Cessation No Change    Warm-up and Cool-down Performed on first and last piece of equipment    Resistance Training Performed Yes    VAD Patient? No    PAD/SET Patient? No      Pain Assessment   Currently in Pain? No/denies                Social History   Tobacco Use  Smoking Status Never  Smokeless Tobacco Never    Goals Met:  Independence with exercise equipment Exercise tolerated well No report of concerns or symptoms today Strength training completed today  Goals Unmet:  Not Applicable  Comments: Pt able to follow exercise prescription today without complaint.  Will continue to monitor for progression.    Dr. Emily Filbert is Medical Director for [place].  Dr. Ottie Glazier is Medical Director for [facility] Pulmonary Rehabilitation.

## 2021-08-24 ENCOUNTER — Other Ambulatory Visit: Payer: Self-pay

## 2021-08-24 DIAGNOSIS — J9601 Acute respiratory failure with hypoxia: Secondary | ICD-10-CM

## 2021-08-24 NOTE — Progress Notes (Signed)
Daily Session Note  Patient Details  Name: Danny Norris MRN: [id number] Date of Birth: Jan 01, 1961 Referring Provider:   Flowsheet Row Pulmonary Rehab from 06/01/2021 in [facility] Cardiac and Pulmonary Rehab  Referring Provider [name], MD       Encounter Date: 08/24/2021  Check In:  Session Check In - 08/24/21 0913       Check-In   Supervising physician immediately available to respond to emergencies See telemetry face sheet for immediately available ER MD    Location [facility]-Cardiac & Pulmonary Rehab    Staff Present Birdie Sons, MPA, RN;Melissa Townsend, RDN, Rowe Pavy, BA, ACSM CEP, Exercise Physiologist;Kara Eliezer Bottom, MS, ASCM CEP, Exercise Physiologist    Virtual Visit No    Medication changes reported     No    Fall or balance concerns reported    No    Tobacco Cessation No Change    Warm-up and Cool-down Performed on first and last piece of equipment    Resistance Training Performed Yes    VAD Patient? No    PAD/SET Patient? No      Pain Assessment   Currently in Pain? No/denies                Social History   Tobacco Use  Smoking Status Never  Smokeless Tobacco Never    Goals Met:  Independence with exercise equipment Exercise tolerated well No report of concerns or symptoms today Strength training completed today  Goals Unmet:  Not Applicable  Comments: Pt able to follow exercise prescription today without complaint.  Will continue to monitor for progression.    Dr. Emily Filbert is Medical Director for [place].  Dr. Ottie Glazier is Medical Director for [facility] Pulmonary Rehabilitation.

## 2021-08-31 ENCOUNTER — Other Ambulatory Visit: Payer: Self-pay

## 2021-08-31 DIAGNOSIS — J9601 Acute respiratory failure with hypoxia: Secondary | ICD-10-CM | POA: Diagnosis not present

## 2021-08-31 NOTE — Progress Notes (Signed)
Daily Session Note  Patient Details  Name: Danny Norris MRN: [id number] Date of Birth: 1961/07/27 Referring Provider:   Flowsheet Row Pulmonary Rehab from 06/01/2021 in [facility] Cardiac and Pulmonary Rehab  Referring Provider [name], MD       Encounter Date: 08/31/2021  Check In:  Session Check In - 08/31/21 0908       Check-In   Supervising physician immediately available to respond to emergencies See telemetry face sheet for immediately available ER MD    Location [facility]-Cardiac & Pulmonary Rehab    Staff Present Birdie Sons, MPA, RN;Amanda Oletta Darter, BA, ACSM CEP, Exercise Physiologist;Jessica Luan Pulling, MA, RCEP, CCRP, CCET    Virtual Visit No    Medication changes reported     No    Fall or balance concerns reported    No    Tobacco Cessation No Change    Warm-up and Cool-down Performed on first and last piece of equipment    Resistance Training Performed Yes    VAD Patient? No    PAD/SET Patient? No      Pain Assessment   Currently in Pain? No/denies                Social History   Tobacco Use  Smoking Status Never  Smokeless Tobacco Never    Goals Met:  Independence with exercise equipment Exercise tolerated well No report of concerns or symptoms today Strength training completed today  Goals Unmet:  Not Applicable  Comments: Pt able to follow exercise prescription today without complaint.  Will continue to monitor for progression.    Dr. Emily Filbert is Medical Director for [place].  Dr. Ottie Glazier is Medical Director for [facility] Pulmonary Rehabilitation.

## 2021-09-01 ENCOUNTER — Encounter: Payer: Self-pay | Admitting: *Deleted

## 2021-09-01 DIAGNOSIS — J9601 Acute respiratory failure with hypoxia: Secondary | ICD-10-CM

## 2021-09-01 NOTE — Progress Notes (Signed)
Pulmonary Individual Treatment Plan  Patient Details  Name: KEKAI GETER MRN: [id number] Date of Birth: Dec 15, 1960 Referring Provider:   Flowsheet Row Pulmonary Rehab from 06/01/2021 in [facility] Cardiac and Pulmonary Rehab  Referring Provider [name], MD       Initial Encounter Date:  Flowsheet Row Pulmonary Rehab from 06/01/2021 in [facility] Cardiac and Pulmonary Rehab  Date 06/01/21       Visit Diagnosis: Acute respiratory failure with hypoxia ([place])  Patient's Home Medications on Admission:  Current Outpatient Medications:    apixaban (ELIQUIS) 5 MG TABS tablet, Apixaban 10 mg po bid x 3 days, than 5 mg po bid, Disp: 66 tablet, Rfl: 1   atorvastatin (LIPITOR) 20 MG tablet, Take 1 tablet (20 mg total) by mouth daily at 6 PM., Disp: 30 tablet, Rfl: 6   docusate sodium (COLACE) 100 MG capsule, Take 1 capsule (100 mg total) by mouth 2 (two) times daily., Disp: 10 capsule, Rfl: 0   feeding supplement (ENSURE ENLIVE / ENSURE PLUS) LIQD, Take 237 mLs by mouth 2 (two) times daily between meals., Disp: 237 mL, Rfl: 12   hydrochlorothiazide (HYDRODIURIL) 25 MG tablet, TAKE 1 TABLET BY MOUTH EVERY DAY (Patient taking differently: Take 25 mg by mouth daily.), Disp: 90 tablet, Rfl: 1   metoprolol succinate (TOPROL-XL) 50 MG 24 hr tablet, TAKE 1 TABLET BY MOUTH DAILY WITH OR IMMEDIATELY FOLLOWING A MEAL (Patient taking differently: Take 50 mg by mouth daily at 6 (six) AM.), Disp: 90 tablet, Rfl: 1   polyethylene glycol (MIRALAX / GLYCOLAX) 17 g packet, Take 17 g by mouth daily., Disp: 14 each, Rfl: 0  Past Medical History: Past Medical History:  Diagnosis Date   Dyslipidemia    HTN (hypertension)    Mitral valve regurgitation    a. 05/2011: TEE showing significant MVP especially involving the P2 segment without evidence for ruptured chordae and had eccentric MR anteriorly directed. b. 03/2015: echo showing EF 65-70% with posterior MV leaflet thickening and prolapse with mild MR.   Normal  coronary arteries    a. normal cors by cath in 08/2011    Tobacco Use: Social History   Tobacco Use  Smoking Status Never  Smokeless Tobacco Never    Labs: Recent Review Flowsheet Data     Labs for ITP Cardiac and Pulmonary Rehab Latest Ref Rng & Units 08/08/2019 12/24/2019 02/19/2021 02/20/2021 02/21/2021   Cholestrol 100 - 199 mg/dL 167 132 - - -   LDLCALC 0 - 99 mg/dL 110(H) 79 - - -   HDL >39 mg/dL 40 37(L) - - -   Trlycerides <150 mg/dL 89 81 - 93 -   Hemoglobin A1c 4.8 - 5.6 % - - 6.3(H) - -   PHART 7.350 - 7.450 - - 7.383 7.576(H) 7.463(H)   PCO2ART 32.0 - 48.0 mmHg - - 57.5(H) 31.0(L) 41.2   HCO3 20.0 - 28.0 mmol/L - - 34.0(H) 28.8(H) 29.5(H)   TCO2 22 - 32 mmol/L - - 36(H) - 31   ACIDBASEDEF 0.0 - 2.0 mmol/L - - - - -   O2SAT % - - 99.0 99.2 92.0        Pulmonary Assessment Scores:  Pulmonary Assessment Scores     Row Name 06/01/21 0950         ADL UCSD   ADL Phase Entry     SOB Score total 18     Rest 0     Walk 2     Stairs 1  Bath 0     Dress 0     Shop 1       CAT Score   CAT Score 7       mMRC Score   mMRC Score 1              UCSD: Self-administered rating of dyspnea associated with activities of daily living (ADLs) 6-point scale (0 = "not at all" to 5 = "maximal or unable to do because of breathlessness")  Scoring Scores range from 0 to 120.  Minimally important difference is 5 units  CAT: CAT can identify the health impairment of COPD patients and is better correlated with disease progression.  CAT has a scoring range of zero to 40. The CAT score is classified into four groups of low (less than 10), medium (10 - 20), high (21-30) and very high (31-40) based on the impact level of disease on health status. A CAT score over 10 suggests significant symptoms.  A worsening CAT score could be explained by an exacerbation, poor medication adherence, poor inhaler technique, or progression of COPD or comorbid conditions.  CAT MCID is 2  points  mMRC: mMRC (Modified Medical Research Council) Dyspnea Scale is used to assess the degree of baseline functional disability in patients of respiratory disease due to dyspnea. No minimal important difference is established. A decrease in score of 1 point or greater is considered a positive change.   Pulmonary Function Assessment:   Exercise Target Goals: Exercise Program Goal: Individual exercise prescription set using results from initial 6 min walk test and THRR while considering  patient's activity barriers and safety.   Exercise Prescription Goal: Initial exercise prescription builds to 30-45 minutes a day of aerobic activity, 2-3 days per week.  Home exercise guidelines will be given to patient during program as part of exercise prescription that the participant will acknowledge.  Education: Aerobic Exercise: - Group verbal and visual presentation on the components of exercise prescription. Introduces F.I.T.T principle from ACSM for exercise prescriptions.  Reviews F.I.T.T. principles of aerobic exercise including progression. Written material given at graduation. Flowsheet Row Pulmonary Rehab from 08/19/2021 in [facility] Cardiac and Pulmonary Rehab  Date 07/29/21  Educator [facility]  Instruction Review Code 1- Verbalizes Understanding       Education: Resistance Exercise: - Group verbal and visual presentation on the components of exercise prescription. Introduces F.I.T.T principle from ACSM for exercise prescriptions  Reviews F.I.T.T. principles of resistance exercise including progression. Written material given at graduation. Flowsheet Row Pulmonary Rehab from 08/19/2021 in [facility] Cardiac and Pulmonary Rehab  Date 08/05/21  Educator as  Instruction Review Code 1- Verbalizes Understanding        Education: Exercise & Equipment Safety: - Individual verbal instruction and demonstration of equipment use and safety with use of the equipment. Flowsheet Row Pulmonary Rehab from  08/19/2021 in [facility] Cardiac and Pulmonary Rehab  Education need identified 06/01/21  Date 06/01/21  Educator [place]  Instruction Review Code 1- Verbalizes Understanding       Education: Exercise Physiology & General Exercise Guidelines: - Group verbal and written instruction with models to review the exercise physiology of the cardiovascular system and associated critical values. Provides general exercise guidelines with specific guidelines to those with heart or lung disease.  Flowsheet Row Pulmonary Rehab from 08/19/2021 in [facility] Cardiac and Pulmonary Rehab  Date 07/22/21  Educator [facility]  Instruction Review Code 1- Verbalizes Understanding       Education: Flexibility, Balance, Mind/Body Relaxation: - Group verbal  and visual presentation with interactive activity on the components of exercise prescription. Introduces F.I.T.T principle from ACSM for exercise prescriptions. Reviews F.I.T.T. principles of flexibility and balance exercise training including progression. Also discusses the mind body connection.  Reviews various relaxation techniques to help reduce and manage stress (i.e. Deep breathing, progressive muscle relaxation, and visualization). Balance handout provided to take home. Written material given at graduation. Flowsheet Row Pulmonary Rehab from 08/19/2021 in [facility] Cardiac and Pulmonary Rehab  Date 06/10/21  Educator [place]  Instruction Review Code 1- Verbalizes Understanding       Activity Barriers & Risk Stratification:  Activity Barriers & Cardiac Risk Stratification - 06/01/21 0944       Activity Barriers & Cardiac Risk Stratification   Activity Barriers Shortness of Breath;Joint Problems;Deconditioning             6 Minute Walk:  6 Minute Walk     Row Name 06/01/21 0947         6 Minute Walk   Phase Initial     Distance 1385 feet     Walk Time 6 minutes     # of Rest Breaks 0     MPH 2.62     METS 3.73     RPE 13     Perceived Dyspnea  2     VO2 Peak  13.07     Symptoms Yes (comment)     Comments SOB     Resting HR 62 bpm     Resting BP 140/82     Resting Oxygen Saturation  96 %     Exercise Oxygen Saturation  during 6 min walk 91 %     Max Ex. HR 106 bpm     Max Ex. BP 166/84     2 Minute Post BP 144/84       Interval HR   1 Minute HR 79     2 Minute HR 96     3 Minute HR 101     4 Minute HR 103     5 Minute HR 106     6 Minute HR 105     2 Minute Post HR 67     Interval Heart Rate? Yes       Interval Oxygen   Interval Oxygen? Yes     Baseline Oxygen Saturation % 96 %     1 Minute Oxygen Saturation % 93 %     1 Minute Liters of Oxygen 2 L     2 Minute Oxygen Saturation % 92 %     2 Minute Liters of Oxygen 2 L     3 Minute Oxygen Saturation % 93 %     3 Minute Liters of Oxygen 2 L     4 Minute Oxygen Saturation % 92 %     4 Minute Liters of Oxygen 2 L     5 Minute Oxygen Saturation % 91 %     5 Minute Liters of Oxygen 2 L     6 Minute Oxygen Saturation % 92 %     6 Minute Liters of Oxygen 2 L     2 Minute Post Oxygen Saturation % 94 %     2 Minute Post Liters of Oxygen 2 L             Oxygen Initial Assessment:  Oxygen Initial Assessment - 06/01/21 0949       Home Oxygen   Home Oxygen Device E-Tanks  Sleep Oxygen Prescription Continuous    Liters per minute 2    Home Exercise Oxygen Prescription None    Home Resting Oxygen Prescription None    Compliance with Home Oxygen Use Yes      Initial 6 min Walk   Oxygen Used Continuous    Liters per minute 2      Program Oxygen Prescription   Program Oxygen Prescription Continuous    Liters per minute 2      Intervention   Short Term Goals To learn and understand importance of monitoring SPO2 with pulse oximeter and demonstrate accurate use of the pulse oximeter.;To learn and understand importance of maintaining oxygen saturations>88%;To learn and exhibit compliance with exercise, home and travel O2 prescription;To learn and demonstrate proper pursed lip  breathing techniques or other breathing techniques. ;To learn and demonstrate proper use of respiratory medications    Long  Term Goals Exhibits compliance with exercise, home  and travel O2 prescription;Verbalizes importance of monitoring SPO2 with pulse oximeter and return demonstration;Maintenance of O2 saturations>88%;Compliance with respiratory medication;Exhibits proper breathing techniques, such as pursed lip breathing or other method taught during program session;Demonstrates proper use of MDI's             Oxygen Re-Evaluation:  Oxygen Re-Evaluation     Row Name 06/08/21 0933 06/17/21 0950 07/22/21 0956 08/17/21 0935       Program Oxygen Prescription   Program Oxygen Prescription Continuous Continuous Continuous;E-Tanks Continuous;E-Tanks    Liters per minute 2 2 2 2       Home Oxygen   Home Oxygen Device E-Tanks E-Tanks E-Tanks E-Tanks    Sleep Oxygen Prescription Continuous Continuous;CPAP Continuous;CPAP Continuous;CPAP    Liters per minute 2 2  recommended 2 2    Home Exercise Oxygen Prescription None None  not currently prescribed None None    Home Resting Oxygen Prescription None None None None    Compliance with Home Oxygen Use Yes Yes Yes Yes      Goals/Expected Outcomes   Short Term Goals To learn and understand importance of monitoring SPO2 with pulse oximeter and demonstrate accurate use of the pulse oximeter.;To learn and understand importance of maintaining oxygen saturations>88%;To learn and exhibit compliance with exercise, home and travel O2 prescription;To learn and demonstrate proper pursed lip breathing techniques or other breathing techniques.  To learn and understand importance of monitoring SPO2 with pulse oximeter and demonstrate accurate use of the pulse oximeter.;To learn and understand importance of maintaining oxygen saturations>88%;To learn and exhibit compliance with exercise, home and travel O2 prescription;To learn and demonstrate proper pursed lip  breathing techniques or other breathing techniques.  To learn and understand importance of monitoring SPO2 with pulse oximeter and demonstrate accurate use of the pulse oximeter.;To learn and understand importance of maintaining oxygen saturations>88%;To learn and exhibit compliance with exercise, home and travel O2 prescription;To learn and demonstrate proper pursed lip breathing techniques or other breathing techniques.  To learn and understand importance of monitoring SPO2 with pulse oximeter and demonstrate accurate use of the pulse oximeter.;To learn and understand importance of maintaining oxygen saturations>88%;To learn and exhibit compliance with exercise, home and travel O2 prescription;To learn and demonstrate proper pursed lip breathing techniques or other breathing techniques.     Long  Term Goals Exhibits compliance with exercise, home  and travel O2 prescription;Verbalizes importance of monitoring SPO2 with pulse oximeter and return demonstration;Maintenance of O2 saturations>88%;Exhibits proper breathing techniques, such as pursed lip breathing or other method taught during program session;Compliance with respiratory  medication Exhibits compliance with exercise, home  and travel O2 prescription;Verbalizes importance of monitoring SPO2 with pulse oximeter and return demonstration;Maintenance of O2 saturations>88%;Exhibits proper breathing techniques, such as pursed lip breathing or other method taught during program session;Compliance with respiratory medication Exhibits compliance with exercise, home  and travel O2 prescription;Verbalizes importance of monitoring SPO2 with pulse oximeter and return demonstration;Maintenance of O2 saturations>88%;Exhibits proper breathing techniques, such as pursed lip breathing or other method taught during program session;Compliance with respiratory medication Exhibits compliance with exercise, home  and travel O2 prescription;Verbalizes importance of monitoring  SPO2 with pulse oximeter and return demonstration;Maintenance of O2 saturations>88%;Exhibits proper breathing techniques, such as pursed lip breathing or other method taught during program session;Compliance with respiratory medication    Comments Reviewed PLB technique with pt.  Talked about how it works and it's importance in maintaining their exercise saturations. Jjesus is doing well with his oxygen in class and wearing it at night.  He has been prescribed a CPAP but has not gotten it yet.  We talked about monitoring his oxygen closely as he goes back to work this week to see if he needs oxygen while he is working.  He is planning to take his pulse oximeter with him.  Overall, he is doing well and uses his inhaler regularly.  He does not have a spacer but we will get him one soon. Masami is doing well in rehab. He is doing well with his oxygen and CPAP.  He has gotten a new mask for his CPAP and now has  a better seal with it.  He is doing well with his pursed lip breathing too. Lorik is doing well in rehab.  His breathing is improving and he is using his PLB.  He is compliant with his CPAP.  He has not been using it for exercise at home, but does watch his sats closely while he is walking.    Goals/Expected Outcomes Short: Become more profiecient at using PLB.   Long: Become independent at using PLB. Short: Monitor oxygen levels at work Long:Continue to improve SOB and using PLB Short: Continue to use new mask Long: Conitnued compliance. Short: Continue to watch sats walking Long: COnitnued compliance             Oxygen Discharge (Final Oxygen Re-Evaluation):  Oxygen Re-Evaluation - 08/17/21 0935       Program Oxygen Prescription   Program Oxygen Prescription Continuous;E-Tanks    Liters per minute 2      Home Oxygen   Home Oxygen Device E-Tanks    Sleep Oxygen Prescription Continuous;CPAP    Liters per minute 2    Home Exercise Oxygen Prescription None    Home Resting Oxygen Prescription None     Compliance with Home Oxygen Use Yes      Goals/Expected Outcomes   Short Term Goals To learn and understand importance of monitoring SPO2 with pulse oximeter and demonstrate accurate use of the pulse oximeter.;To learn and understand importance of maintaining oxygen saturations>88%;To learn and exhibit compliance with exercise, home and travel O2 prescription;To learn and demonstrate proper pursed lip breathing techniques or other breathing techniques.     Long  Term Goals Exhibits compliance with exercise, home  and travel O2 prescription;Verbalizes importance of monitoring SPO2 with pulse oximeter and return demonstration;Maintenance of O2 saturations>88%;Exhibits proper breathing techniques, such as pursed lip breathing or other method taught during program session;Compliance with respiratory medication    Comments Santos is doing well in rehab.  His breathing  is improving and he is using his PLB.  He is compliant with his CPAP.  He has not been using it for exercise at home, but does watch his sats closely while he is walking.    Goals/Expected Outcomes Short: Continue to watch sats walking Long: COnitnued compliance             Initial Exercise Prescription:  Initial Exercise Prescription - 06/01/21 1000       Date of Initial Exercise RX and Referring Provider   Date 06/01/21    Referring Provider [name], MD      Oxygen   Oxygen Continuous    Liters 2    Maintain Oxygen Saturation 88% or higher      Treadmill   MPH 2.3    Grade 0.5    Minutes 15    METs 2.92      Recumbant Bike   Level 2    RPM 60    Watts 30    Minutes 15    METs 3.7      REL-XR   Level 2    Speed 50    Minutes 15    METs 3.7      Prescription Details   Frequency (times per week) 2    Duration Progress to 30 minutes of continuous aerobic without signs/symptoms of physical distress      Intensity   THRR 40-80% of Max Heartrate 101-141    Ratings of Perceived Exertion 11-13     Perceived Dyspnea 0-4      Progression   Progression Continue to progress workloads to maintain intensity without signs/symptoms of physical distress.      Resistance Training   Training Prescription Yes    Weight 4 lb    Reps 10-15             Perform Capillary Blood Glucose checks as needed.  Exercise Prescription Changes:   Exercise Prescription Changes     Row Name 06/01/21 1000 06/10/21 1500 06/17/21 0900 06/22/21 1400 07/05/21 1500     Response to Exercise   Blood Pressure (Admit) 140/82 122/68 -- 126/64 138/68   Blood Pressure (Exercise) 166/84 172/78 -- 120/64 146/82   Blood Pressure (Exit) 144/84 124/78 -- 114/66 124/70   Heart Rate (Admit) 62 bpm 68 bpm -- 74 bpm 69 bpm   Heart Rate (Exercise) 106 bpm 92 bpm -- 105 bpm 84 bpm   Heart Rate (Exit) 67 bpm 76 bpm -- 90 bpm 77 bpm   Oxygen Saturation (Admit) 96 % 94 % -- 91 % 94 %   Oxygen Saturation (Exercise) 91 % 93 % -- 93 % 92 %   Oxygen Saturation (Exit) 94 % 94 % -- 94 % 91 %   Rating of Perceived Exertion (Exercise) 13 12 -- 12 12   Perceived Dyspnea (Exercise) 2 2 -- 2 2   Symptoms SOB SOB -- -- SOB   Comments walk test results first full day exercise -- -- --   Duration -- Progress to 30 minutes of  aerobic without signs/symptoms of physical distress -- Continue with 30 min of aerobic exercise without signs/symptoms of physical distress. Continue with 30 min of aerobic exercise without signs/symptoms of physical distress.   Intensity -- THRR unchanged -- THRR unchanged THRR unchanged     Progression   Progression -- Continue to progress workloads to maintain intensity without signs/symptoms of physical distress. -- Continue to progress workloads to maintain intensity without signs/symptoms of physical distress.  Continue to progress workloads to maintain intensity without signs/symptoms of physical distress.   Average METs -- 2.82 -- 2.82 3.08     Resistance Training   Training Prescription -- Yes -- Yes  Yes   Weight -- 4 lb -- 4 lb 4 lb   Reps -- 10-15 -- 10-15 10-15     Interval Training   Interval Training -- -- -- -- No     Oxygen   Oxygen -- Continuous -- Continuous Continuous   Liters -- 2 -- 2 2     Treadmill   MPH -- 2.3 -- 2.3 2.3   Grade -- 0.5 -- 0.5 0.5   Minutes -- 15 -- 15 15   METs -- 2.92 -- 2.92 2.92     Recumbant Bike   Level -- 2 -- 3 3   Watts -- 25 -- 25 16   Minutes -- 15 -- 15 15   METs -- 2.73 -- 2.72 2.55     REL-XR   Level -- -- -- -- 2   Minutes -- -- -- -- 15   METs -- -- -- -- 3.9     Home Exercise Plan   Plans to continue exercise at -- -- Home (comment)  walking, staff videos Home (comment)  walking, staff videos Home (comment)  walking, staff videos   Frequency -- -- Add 2 additional days to program exercise sessions. Add 2 additional days to program exercise sessions. Add 2 additional days to program exercise sessions.   Initial Home Exercises Provided -- -- 06/17/21 06/17/21 06/17/21     Oxygen   Maintain Oxygen Saturation -- 88% or higher -- -- 88% or higher    Row Name 08/02/21 1000 08/17/21 1400 08/31/21 1400         Response to Exercise   Blood Pressure (Admit) 124/62 122/72 126/64     Blood Pressure (Exercise) 172/62 -- --     Blood Pressure (Exit) 128/72 112/58 104/56     Heart Rate (Admit) 75 bpm 67 bpm 64 bpm     Heart Rate (Exercise) 109 bpm 104 bpm 104 bpm     Heart Rate (Exit) 85 bpm 82 bpm 77 bpm     Oxygen Saturation (Admit) 92 % 95 % 96 %     Oxygen Saturation (Exercise) 93 % 93 % 92 %     Oxygen Saturation (Exit) 93 % 92 % 93 %     Rating of Perceived Exertion (Exercise) 13 12 11      Perceived Dyspnea (Exercise) 2 2 2      Symptoms SOB SOB SOB     Duration Continue with 30 min of aerobic exercise without signs/symptoms of physical distress. Continue with 30 min of aerobic exercise without signs/symptoms of physical distress. Continue with 30 min of aerobic exercise without signs/symptoms of physical distress.      Intensity THRR unchanged THRR unchanged THRR unchanged       Progression   Progression Continue to progress workloads to maintain intensity without signs/symptoms of physical distress. Continue to progress workloads to maintain intensity without signs/symptoms of physical distress. Continue to progress workloads to maintain intensity without signs/symptoms of physical distress.     Average METs 4.03 4.4 3.72       Resistance Training   Training Prescription Yes Yes Yes     Weight 4 lb 4 lb 4 lb     Reps 10-15 10-15 10-15       Interval Training   Interval  Training No No No       Oxygen   Oxygen Continuous Continuous Continuous     Liters 2 2 2        Treadmill   MPH 2.9 3 3      Grade 4 4 4      Minutes 15 15 15      METs 4.82 4.95 4.95       Recumbant Bike   Level 7 9 9      Watts -- 55 60     Minutes 15 15 15      METs 2.71 3.86 3.72       REL-XR   Level 3 -- 6     Minutes 15 -- 15     METs 4.75 -- 2.5       Home Exercise Plan   Plans to continue exercise at Home (comment)  walking, staff videos Home (comment)  walking, staff videos Home (comment)  walking, staff videos     Frequency Add 2 additional days to program exercise sessions. Add 2 additional days to program exercise sessions. Add 2 additional days to program exercise sessions.     Initial Home Exercises Provided 06/17/21 06/17/21 06/17/21       Oxygen   Maintain Oxygen Saturation 88% or higher 88% or higher 88% or higher              Exercise Comments:   Exercise Comments     Row Name 06/08/21 0932           Exercise Comments First full day of exercise!  Patient was oriented to gym and equipment including functions, settings, policies, and procedures.  Patient's individual exercise prescription and treatment plan were reviewed.  All starting workloads were established based on the results of the 6 minute walk test done at initial orientation visit.  The plan for exercise progression was also introduced  and progression will be customized based on patient's performance and goals.                Exercise Goals and Review:   Exercise Goals     Row Name 06/01/21 1055             Exercise Goals   Increase Physical Activity Yes       Intervention Provide advice, education, support and counseling about physical activity/exercise needs.;Develop an individualized exercise prescription for aerobic and resistive training based on initial evaluation findings, risk stratification, comorbidities and participant's personal goals.       Expected Outcomes Short Term: Attend rehab on a regular basis to increase amount of physical activity.;Long Term: Add in home exercise to make exercise part of routine and to increase amount of physical activity.;Long Term: Exercising regularly at least 3-5 days a week.       Increase Strength and Stamina Yes       Intervention Provide advice, education, support and counseling about physical activity/exercise needs.;Develop an individualized exercise prescription for aerobic and resistive training based on initial evaluation findings, risk stratification, comorbidities and participant's personal goals.       Expected Outcomes Short Term: Increase workloads from initial exercise prescription for resistance, speed, and METs.;Short Term: Perform resistance training exercises routinely during rehab and add in resistance training at home;Long Term: Improve cardiorespiratory fitness, muscular endurance and strength as measured by increased METs and functional capacity (6MWT)       Able to understand and use rate of perceived exertion (RPE) scale Yes       Intervention Provide  education and explanation on how to use RPE scale       Expected Outcomes Short Term: Able to use RPE daily in rehab to express subjective intensity level;Long Term:  Able to use RPE to guide intensity level when exercising independently       Able to understand and use Dyspnea scale Yes        Intervention Provide education and explanation on how to use Dyspnea scale       Expected Outcomes Short Term: Able to use Dyspnea scale daily in rehab to express subjective sense of shortness of breath during exertion;Long Term: Able to use Dyspnea scale to guide intensity level when exercising independently       Knowledge and understanding of Target Heart Rate Range (THRR) Yes       Intervention Provide education and explanation of THRR including how the numbers were predicted and where they are located for reference       Expected Outcomes Short Term: Able to state/look up THRR;Long Term: Able to use THRR to govern intensity when exercising independently;Short Term: Able to use daily as guideline for intensity in rehab       Able to check pulse independently Yes       Intervention Provide education and demonstration on how to check pulse in carotid and radial arteries.;Review the importance of being able to check your own pulse for safety during independent exercise       Expected Outcomes Short Term: Able to explain why pulse checking is important during independent exercise;Long Term: Able to check pulse independently and accurately       Understanding of Exercise Prescription Yes       Intervention Provide education, explanation, and written materials on patient's individual exercise prescription       Expected Outcomes Short Term: Able to explain program exercise prescription;Long Term: Able to explain home exercise prescription to exercise independently                Exercise Goals Re-Evaluation :  Exercise Goals Re-Evaluation     Row Name 06/08/21 0932 06/10/21 1549 06/17/21 0937 06/22/21 1418 07/05/21 1542     Exercise Goal Re-Evaluation   Exercise Goals Review Increase Physical Activity;Able to understand and use rate of perceived exertion (RPE) scale;Knowledge and understanding of Target Heart Rate Range (THRR);Understanding of Exercise Prescription;Increase Strength and  Stamina;Able to understand and use Dyspnea scale;Able to check pulse independently Increase Physical Activity;Understanding of Exercise Prescription;Increase Strength and Stamina Increase Physical Activity;Understanding of Exercise Prescription;Increase Strength and Stamina;Able to understand and use rate of perceived exertion (RPE) scale;Able to understand and use Dyspnea scale;Able to check pulse independently;Knowledge and understanding of Target Heart Rate Range (THRR) Increase Physical Activity;Increase Strength and Stamina Increase Physical Activity;Increase Strength and Stamina;Understanding of Exercise Prescription   Comments Reviewed RPE and dyspnea scales, THR and program prescription with pt today.  Pt voiced understanding and was given a copy of goals to take home. Mohammedali came for the first several sessions of rehab and has done well thus far. Staff will continue to monitor his progress as patient progresses through the program. Taft is doing well in rehab. He is starting to feel like his stamina is starting to recover.  Reviewed home exercise with pt today.  Pt plans to walk at park and use staff videos at home for exercise.  Reviewed THR, pulse, RPE, sign and symptoms, pulse oximetery and when to call 911 or MD.  Also discussed weather considerations and indoor options.  Pt voiced understanding. Trentin is tolerating exercise well in his first sessions.  He has rache dhis THR one session.  Staff will review THR range and encourage him to increase levels to get to THR range Stacie is doing well in rehab.  He is up to 3.9 METs on the XR.  We will continue to monitor his progress.   Expected Outcomes Short: Use RPE daily to regulate intensity. Long: Follow program prescription in THR. Short: Continue to follow exercise prescription Long: Continue to increase overall MET level Short: Start to add in walking at least one extra day at home Long: continue to improve stamina. Short: work in [employer] range Long:  improve  overall stamina Short: Push treadmill back up to 2.3 mph Long: Conitnue to improve stamina    Row Name 07/22/21 0950 08/02/21 1032 08/17/21 0930 08/31/21 1358       Exercise Goal Re-Evaluation   Exercise Goals Review Increase Physical Activity;Increase Strength and Stamina;Understanding of Exercise Prescription Increase Physical Activity;Increase Strength and Stamina Increase Physical Activity;Increase Strength and Stamina;Understanding of Exercise Prescription Increase Physical Activity;Increase Strength and Stamina;Understanding of Exercise Prescription    Comments Brayln is doing well in rehab. He does get out at least 2 extra days to walk each week.  He would like ot do three days, but usually will find something comes up or weather doesn't work. He is feeling better with his strength and stamina and getting stronger. Geoffrey continues to do well in rehab. He has significantly increased his loads on the treadmill to 2.9 speed with a 4% incline and tolerating it well. His recumbant bike has increased to level 7. Will continue to monitor his progression. Marquell is doing well in rehab. He is walking some on his off days.  Yesterday, he went to the park to walk to enjoy the fall air.  He is starting to feel a little stronger now. Alwyn is doing well in rehab.  He is up to 60 watts on the bike.  We will encourage him to try 5 lb weights.  We will continue to monitor his progress.    Expected Outcomes Short: Add in more exercise at home.  Long: Continue to improve stamina. Short: Continue to work within [place]: Continue to increase overall MET level Short: Continue to exercise on off days Long: conitnue to improve stamina. Short: Try 5 lb weights Long: Continue to improve strength             Discharge Exercise Prescription (Final Exercise Prescription Changes):  Exercise Prescription Changes - 08/31/21 1400       Response to Exercise   Blood Pressure (Admit) 126/64    Blood Pressure (Exit) 104/56     Heart Rate (Admit) 64 bpm    Heart Rate (Exercise) 104 bpm    Heart Rate (Exit) 77 bpm    Oxygen Saturation (Admit) 96 %    Oxygen Saturation (Exercise) 92 %    Oxygen Saturation (Exit) 93 %    Rating of Perceived Exertion (Exercise) 11    Perceived Dyspnea (Exercise) 2    Symptoms SOB    Duration Continue with 30 min of aerobic exercise without signs/symptoms of physical distress.    Intensity THRR unchanged      Progression   Progression Continue to progress workloads to maintain intensity without signs/symptoms of physical distress.    Average METs 3.72      Resistance Training   Training Prescription Yes    Weight 4 lb  Reps 10-15      Interval Training   Interval Training No      Oxygen   Oxygen Continuous    Liters 2      Treadmill   MPH 3    Grade 4    Minutes 15    METs 4.95      Recumbant Bike   Level 9    Watts 60    Minutes 15    METs 3.72      REL-XR   Level 6    Minutes 15    METs 2.5      Home Exercise Plan   Plans to continue exercise at Home (comment)   walking, staff videos   Frequency Add 2 additional days to program exercise sessions.    Initial Home Exercises Provided 06/17/21      Oxygen   Maintain Oxygen Saturation 88% or higher             Nutrition:  Target Goals: Understanding of nutrition guidelines, daily intake of sodium <1574m, cholesterol <2073m calories 30% from fat and 7% or less from saturated fats, daily to have 5 or more servings of fruits and vegetables.  Education: All About Nutrition: -Group instruction provided by verbal, written material, interactive activities, discussions, models, and posters to present general guidelines for heart healthy nutrition including fat, fiber, MyPlate, the role of sodium in heart healthy nutrition, utilization of the nutrition label, and utilization of this knowledge for meal planning. Follow up email sent as well. Written material given at graduation. Flowsheet Row [facility]  Education need identified 06/01/21  Date 08/19/21  Educator MCShell PointInstruction Review Code 1- Verbalizes Understanding       Biometrics:  Pre Biometrics - 06/01/21 0944       Pre Biometrics   Height 6' (1.829 m)    Weight 238 lb 9.6 oz (108.2 kg)    BMI (Calculated) 32.35    Single Leg Stand 3.93 seconds              Nutrition Therapy Plan and Nutrition Goals:  Nutrition Therapy & Goals - 07/20/21 0845       Nutrition Therapy   Diet Heart healthy, low Na, pulmonary friendly    Protein (specify units) 85-90g    Fiber 30 grams    Whole Grain Foods 3 servings    Saturated Fats 12 max. grams    Fruits and Vegetables 8 servings/day    Sodium 1.5 grams      Personal Nutrition Goals   Nutrition Goal ST: bring lunch to work (PB and Jelly on whole wheat, tuna salad with whole wheat crackers, vegetable and chicken on whole wheat) LT: limit saturated fat to <12g/day, limit Na to <2g/day, eat a variety of 8 fruits/vegetables per day, eat out <2 days per week.    Comments B: bacon and eggs 2x/week or cereal (grapenuts, fiber one with whole milk) L: at work: chicken salad sandwich with chips and fruit D: varies: hamburger, chicken with (brown) rice, quinoa, barley, potatoes with a salad and nonstarchy vegetables. She uses some olive oil with cooking and applesauce with baking. Drinks: coffee (french vanilla creamer), herbal tea, water. No shortness of breath with meals. His sister in-law AnLevada Dyas enthusiastic and spoke frequently during the consultation; sister in-law is a heEngineer, maintenancend was inputting additional education and suggestions during consultation. Reviewed heart healthy eating from nutrition education and pulmonary MNT. PaEddie Dibbles  would like to first work on his lunch and would like to start bringing lunch to work.      Intervention Plan   Intervention Prescribe, educate and counsel regarding individualized specific  dietary modifications aiming towards targeted core components such as weight, hypertension, lipid management, diabetes, heart failure and other comorbidities.    Expected Outcomes Short Term Goal: Understand basic principles of dietary content, such as calories, fat, sodium, cholesterol and nutrients.;Short Term Goal: A plan has been developed with personal nutrition goals set during dietitian appointment.;Long Term Goal: Adherence to prescribed nutrition plan.             Nutrition Assessments:  MEDIFICTS Score Key: ?70 Need to make dietary changes  40-70 Heart Healthy Diet ? 40 Therapeutic Level Cholesterol Diet  Flowsheet Row Pulmonary Rehab from 06/01/2021 in [facility] Cardiac and Pulmonary Rehab  Picture Your Plate Total Score on Admission 49      Picture Your Plate Scores: <49 Unhealthy dietary pattern with much room for improvement. 41-50 Dietary pattern unlikely to meet recommendations for good health and room for improvement. 51-60 More healthful dietary pattern, with some room for improvement.  >60 Healthy dietary pattern, although there may be some specific behaviors that could be improved.   Nutrition Goals Re-Evaluation:  Nutrition Goals Re-Evaluation     [place] Name 06/17/21 0941 08/17/21 0933           Goals   Nutrition Goal Meet with dietitian ST: bring lunch to work (PB and Jelly on whole wheat, tuna salad with whole wheat crackers, vegetable and chicken on whole wheat) LT: limit saturated fat to <12g/day, limit Na to <2g/day, eat a variety of 8 fruits/vegetables per day, eat out <2 days per week.      Comment Marquiz is doing well in rehab.  He needs to meet with dietitian yet.  He is already working on low salt.  We did talk about making sure he gets enough protein and enough fruits and vegetables. Vashon is doing well in rehab.  He is doing okay with his diet. He has not quite tried the whole wheat bread for lunch yet.  He has started to increase his fruits and vegetables.   He has added in prunes for breakfast.  He is has cut back some on eating out.      Expected Outcome Short: Meet dietitian Long: Add in more protein and balance SHort: Try whole wheat for lunch Long: COnitnue to add in variety.               Nutrition Goals Discharge (Final Nutrition Goals Re-Evaluation):  Nutrition Goals Re-Evaluation - 08/17/21 0933       Goals   Nutrition Goal ST: bring lunch to work (PB and Jelly on whole wheat, tuna salad with whole wheat crackers, vegetable and chicken on whole wheat) LT: limit saturated fat to <12g/day, limit Na to <2g/day, eat a variety of 8 fruits/vegetables per day, eat out <2 days per week.    Comment Efrem is doing well in rehab.  He is doing okay with his diet. He has not quite tried the whole wheat bread for lunch yet.  He has started to increase his fruits and vegetables.  He has added in prunes for breakfast.  He is has cut back some on eating out.    Expected Outcome SHort: Try whole wheat for lunch Long: COnitnue to add in variety.             Psychosocial: Target  Goals: Acknowledge presence or absence of significant depression and/or stress, maximize coping skills, provide positive support system. Participant is able to verbalize types and ability to use techniques and skills needed for reducing stress and depression.   Education: Stress, Anxiety, and Depression - Group verbal and visual presentation to define topics covered.  Reviews how body is impacted by stress, anxiety, and depression.  Also discusses healthy ways to reduce stress and to treat/manage anxiety and depression.  Written material given at graduation.   Education: Sleep Hygiene -Provides group verbal and written instruction about how sleep can affect your health.  Define sleep hygiene, discuss sleep cycles and impact of sleep habits. Review good sleep hygiene tips.    Initial Review & Psychosocial Screening:  Initial Psych Review & Screening - 05/21/21 1509        Initial Review   Current issues with Current Sleep Concerns      Family Dynamics   Good Support System? Yes      Barriers   Psychosocial barriers to participate in program There are no identifiable barriers or psychosocial needs.;The patient should benefit from training in stress management and relaxation.      Screening Interventions   Interventions Encouraged to exercise;Provide feedback about the scores to participant;To provide support and resources with identified psychosocial needs    Expected Outcomes Short Term goal: Utilizing psychosocial counselor, staff and physician to assist with identification of specific Stressors or current issues interfering with healing process. Setting desired goal for each stressor or current issue identified.;Long Term Goal: Stressors or current issues are controlled or eliminated.;Short Term goal: Identification and review with participant of any Quality of Life or Depression concerns found by scoring the questionnaire.;Long Term goal: The participant improves quality of Life and PHQ9 Scores as seen by post scores and/or verbalization of changes             Quality of Life Scores:  Scores of 19 and below usually indicate a poorer quality of life in these areas.  A difference of  2-3 points is a clinically meaningful difference.  A difference of 2-3 points in the total score of the Quality of Life Index has been associated with significant improvement in overall quality of life, self-image, physical symptoms, and general health in studies assessing change in quality of life.  PHQ-9: Recent Review Flowsheet Data     Depression screen [employer] 2/9 06/29/2021 06/01/2021   Decreased Interest 1 0   Down, Depressed, Hopeless 0 1   PHQ - 2 Score 1 1   Altered sleeping 1 2   Tired, decreased energy 1 1   Change in appetite 0 1   Feeling bad or failure about yourself  0 1   Trouble concentrating 0 1   Moving slowly or fidgety/restless 0 0   Suicidal  thoughts 0 0   PHQ-9 Score 3 7   Difficult doing work/chores Somewhat difficult Somewhat difficult      Interpretation of Total Score  Total Score Depression Severity:  1-4 = Minimal depression, 5-9 = Mild depression, 10-14 = Moderate depression, 15-19 = Moderately severe depression, 20-27 = Severe depression   Psychosocial Evaluation and Intervention:  Psychosocial Evaluation - 05/21/21 1526       Psychosocial Evaluation & Interventions   Interventions Encouraged to exercise with the program and follow exercise prescription    Comments Mr. Squillace breathing has been a concern for him for a while. Thankfully he has been able to wean himself off his oxygen  during the day. Currently, he requires 2L at night but is getting a sleep study soon to determine if he needs CPAP. His sister in law is involved in his care. He plans on going back to work Sept 19 so he isn't sure what his schedule will look like.    Expected Outcomes Short: attend pulmonary rehab for education and exercise. Long: develop and maintain positive self care habits.    Continue Psychosocial Services  Follow up required by staff             Psychosocial Re-Evaluation:  Psychosocial Re-Evaluation     [place] Name 06/17/21 [phone] 06/29/21 0925 07/22/21 0951 08/17/21 0932       Psychosocial Re-Evaluation   Current issues with Current Sleep Concerns Current Stress Concerns;Current Depression Current Stress Concerns;Current Depression Current Stress Concerns;Current Depression    Comments Darry is doing well in rehab.  He is generally a positive guy and tries not to let anything stress him out too much.  He is a light sleeper and has always been.  He does have a hard time getting back to sleep.  He has had a sleep study in the past and is going to get a CPAP ordered. Samaj is planning to return to work this weekend and will keep an eye on his oxygen levels. Reviewed patient health questionnaire (PHQ-9) with patient for follow up.  Previously, patients score indicated signs/symptoms of depression.  Reviewed to see if patient is improving symptom wise while in program.  Score improved and patient states that it is because they have been able to get out more and feeling better overall. Keontae is doing well in rehab.  He denies any major stressors currenty. He has not had as much depression symptoms recently, a few days here and there.  He is still sleeping well and enjoying coming to class. Dewon doing well in rehab. Overall, he is doing well mentally.  He had some mild depression sneak in on him recently.  He is sleeping well most days, but still having some bad nights too.    Expected Outcomes Short: Get CPAP to help with sleep Long: Continue to focus on positive. Short: Continue to attend [facility] regularly for regular exercise and social engagement. Long: Continue to improve symptoms and manage a positive mental state. Short: Continue to exercise for mental boost Long: Continue to stay positve Short: Continue to exercise for mental boost Long: Continue to stay positve    Interventions Encouraged to attend Pulmonary Rehabilitation for the exercise Encouraged to attend Pulmonary Rehabilitation for the exercise Encouraged to attend Pulmonary Rehabilitation for the exercise Encouraged to attend Pulmonary Rehabilitation for the exercise    Continue Psychosocial Services  Follow up required by staff -- -- --             Psychosocial Discharge (Final Psychosocial Re-Evaluation):  Psychosocial Re-Evaluation - 08/17/21 0932       Psychosocial Re-Evaluation   Current issues with Current Stress Concerns;Current Depression    Comments Truth doing well in rehab. Overall, he is doing well mentally.  He had some mild depression sneak in on him recently.  He is sleeping well most days, but still having some bad nights too.    Expected Outcomes Short: Continue to exercise for mental boost Long: Continue to stay positve    Interventions  Encouraged to attend Pulmonary Rehabilitation for the exercise             Education: Education Goals: Education classes will be  provided on a weekly basis, covering required topics. Participant will state understanding/return demonstration of topics presented.  Learning Barriers/Preferences:   General Pulmonary Education Topics:  Infection Prevention: - Provides verbal and written material to individual with discussion of infection control including proper hand washing and proper equipment cleaning during exercise session. Flowsheet Row Pulmonary Rehab from 08/19/2021 in [facility] Cardiac and Pulmonary Rehab  Education need identified 06/01/21  Date 06/01/21  Educator [place]  Instruction Review Code 1- Verbalizes Understanding       Falls Prevention: - Provides verbal and written material to individual with discussion of falls prevention and safety. Flowsheet Row Pulmonary Rehab from 08/19/2021 in [facility] Cardiac and Pulmonary Rehab  Education need identified 06/01/21  Date 06/01/21  Educator [place]  Instruction Review Code 1- Verbalizes Understanding       Chronic Lung Disease Review: - Group verbal instruction with posters, models, PowerPoint presentations and videos,  to review new updates, new respiratory medications, new advancements in procedures and treatments. Providing information on websites and "800" numbers for continued self-education. Includes information about supplement oxygen, available portable oxygen systems, continuous and intermittent flow rates, oxygen safety, concentrators, and Medicare reimbursement for oxygen. Explanation of Pulmonary Drugs, including class, frequency, complications, importance of spacers, rinsing mouth after steroid MDI's, and proper cleaning methods for nebulizers. Review of basic lung anatomy and physiology related to function, structure, and complications of lung disease. Review of risk factors. Discussion about methods for diagnosing sleep apnea  and types of masks and machines for OSA. Includes a review of the use of types of environmental controls: home humidity, furnaces, filters, dust mite/pet prevention, HEPA vacuums. Discussion about weather changes, air quality and the benefits of nasal washing. Instruction on Warning signs, infection symptoms, calling MD promptly, preventive modes, and value of vaccinations. Review of effective airway clearance, coughing and/or vibration techniques. Emphasizing that all should Create an Action Plan. Written material given at graduation. Flowsheet Row Pulmonary Rehab from 08/19/2021 in [facility] Cardiac and Pulmonary Rehab  Education need identified 06/01/21  Date 07/08/21  Educator [facility]  Instruction Review Code 1- Verbalizes Understanding       AED/CPR: - Group verbal and written instruction with the use of models to demonstrate the basic use of the AED with the basic ABC's of resuscitation.    Anatomy and Cardiac Procedures: - Group verbal and visual presentation and models provide information about basic cardiac anatomy and function. Reviews the testing methods done to diagnose heart disease and the outcomes of the test results. Describes the treatment choices: Medical Management, Angioplasty, or Coronary Bypass Surgery for treating various heart conditions including Myocardial Infarction, Angina, Valve Disease, and Cardiac Arrhythmias.  Written material given at graduation. Flowsheet Row Pulmonary Rehab from 08/19/2021 in [facility] Cardiac and Pulmonary Rehab  Date 08/05/21  Educator sb  Instruction Review Code 1- Verbalizes Understanding       Medication Safety: - Group verbal and visual instruction to review commonly prescribed medications for heart and lung disease. Reviews the medication, class of the drug, and side effects. Includes the steps to properly store meds and maintain the prescription regimen.  Written material given at graduation. Flowsheet Row Pulmonary Rehab from 08/19/2021 in [facility]  Cardiac and Pulmonary Rehab  Date 06/17/21  Educator KB  Instruction Review Code 1- Verbalizes Understanding       Other: -Provides group and verbal instruction on various topics (see comments)   Knowledge Questionnaire Score:  Knowledge Questionnaire Score - 06/01/21 0955       Knowledge  Questionnaire Score   Pre Score 13/18: ADL, O2, PLB              Core Components/Risk Factors/Patient Goals at Admission:  Personal Goals and Risk Factors at Admission - 06/01/21 1056       Core Components/Risk Factors/Patient Goals on Admission    Weight Management Yes;Weight Loss    Intervention Weight Management: Develop a combined nutrition and exercise program designed to reach desired caloric intake, while maintaining appropriate intake of nutrient and fiber, sodium and fats, and appropriate energy expenditure required for the weight goal.;Weight Management: Provide education and appropriate resources to help participant work on and attain dietary goals.;Weight Management/Obesity: Establish reasonable short term and long term weight goals.    Admit Weight 238 lb (108 kg)    Goal Weight: Short Term 233 lb (105.7 kg)    Goal Weight: Long Term 220 lb (99.8 kg)    Expected Outcomes Short Term: Continue to assess and modify interventions until short term weight is achieved;Long Term: Adherence to nutrition and physical activity/exercise program aimed toward attainment of established weight goal;Weight Loss: Understanding of general recommendations for a balanced deficit meal plan, which promotes 1-2 lb weight loss per week and includes a negative energy balance of [phone] kcal/d;Understanding recommendations for meals to include 15-35% energy as protein, 25-35% energy from fat, 35-60% energy from carbohydrates, less than 277m of dietary cholesterol, 20-35 gm of total fiber daily;Understanding of distribution of calorie intake throughout the day with the consumption of 4-5 meals/snacks     Improve shortness of breath with ADL's Yes    Intervention Provide education, individualized exercise plan and daily activity instruction to help decrease symptoms of SOB with activities of daily living.    Expected Outcomes Short Term: Improve cardiorespiratory fitness to achieve a reduction of symptoms when performing ADLs;Long Term: Be able to perform more ADLs without symptoms or delay the onset of symptoms    Hypertension Yes    Intervention Monitor prescription use compliance.;Provide education on lifestyle modifcations including regular physical activity/exercise, weight management, moderate sodium restriction and increased consumption of fresh fruit, vegetables, and low fat dairy, alcohol moderation, and smoking cessation.    Expected Outcomes Short Term: Continued assessment and intervention until BP is < 140/967mHG in hypertensive participants. < 130/804mG in hypertensive participants with diabetes, heart failure or chronic kidney disease.;Long Term: Maintenance of blood pressure at goal levels.    Lipids Yes    Intervention Provide education and support for participant on nutrition & aerobic/resistive exercise along with prescribed medications to achieve LDL <74m12mDL >40mg57m Expected Outcomes Short Term: Participant states understanding of desired cholesterol values and is compliant with medications prescribed. Participant is following exercise prescription and nutrition guidelines.;Long Term: Cholesterol controlled with medications as prescribed, with individualized exercise RX and with personalized nutrition plan. Value goals: LDL < 74mg,26m > 40 mg.             Education:Diabetes - Individual verbal and written instruction to review signs/symptoms of diabetes, desired ranges of glucose level fasting, after meals and with exercise. Acknowledge that pre and post exercise glucose checks will be done for 3 sessions at entry of program.   Know Your Numbers and Heart Failure: -  Group verbal and visual instruction to discuss disease risk factors for cardiac and pulmonary disease and treatment options.  Reviews associated critical values for Overweight/Obesity, Hypertension, Cholesterol, and Diabetes.  Discusses basics of heart failure: signs/symptoms and treatments.  Introduces Heart Failure  Zone chart for action plan for heart failure.  Written material given at graduation. Flowsheet Row Pulmonary Rehab from 08/19/2021 in [facility] Cardiac and Pulmonary Rehab  Date 07/01/21  Educator KB  Instruction Review Code 1- Verbalizes Understanding       Core Components/Risk Factors/Patient Goals Review:   Goals and Risk Factor Review     Row Name 06/17/21 0946 07/22/21 0953 08/17/21 0934         Core Components/Risk Factors/Patient Goals Review   Personal Goals Review Weight Management/Obesity;Increase knowledge of respiratory medications and ability to use respiratory devices properly.;Improve shortness of breath with ADL's;Hypertension;Lipids Weight Management/Obesity;Increase knowledge of respiratory medications and ability to use respiratory devices properly.;Improve shortness of breath with ADL's;Hypertension;Lipids Weight Management/Obesity;Increase knowledge of respiratory medications and ability to use respiratory devices properly.;Improve shortness of breath with ADL's;Hypertension;Lipids     Review Kenlee is doing well in rehab.  His weight is still going up and down depending on what he is eating.  His blood pressures have reading higher at home than here by about 30 pts.  His pressures tend to run slightly elevated anyways.  We talked about bringing in his blood pressure cuff. His breathing is improving and he is able to do more.  He is using his inhaler and doing well. He does not have a spacer yet and we will get him one once back in stock. Sueo is doing well in rehab.  His weight is trending up again and he admits to needing to buckle down on his diet.  He just met with  dietian this week, so he is hoping that will help some.  He is doing well  with inhalers and meds.  His breathing is doing better.  Pressures have continued to do well. He did bring in his cuff and decided he needed a new one. Sheridan is doing well in rehab.  His weight is holding steady and he is still trying to get it down more.  His breathing is getting better and doing well with his inhalers.  Blood pressures still doing well in class and he did get a new cuff for home.     Expected Outcomes Short: Get spacer for inhaler and bring in cuff  Long: Continue to work on weight loss SHort: Continue to use inhaler when needed Long: Continue to work on weight loss SHort: Continue to use inhaler when needed Long: Continue to work on [employer] loss              Core Components/Risk Factors/Patient Goals at Discharge (Final Review):   Goals and Risk Factor Review - 08/17/21 0934       Core Components/Risk Factors/Patient Goals Review   Personal Goals Review Weight Management/Obesity;Increase knowledge of respiratory medications and ability to use respiratory devices properly.;Improve shortness of breath with ADL's;Hypertension;Lipids    Review Kasen is doing well in rehab.  His weight is holding steady and he is still trying to get it down more.  His breathing is getting better and doing well with his inhalers.  Blood pressures still doing well in class and he did get a new cuff for home.    Expected Outcomes SHort: Continue to use inhaler when needed Long: Continue to work on weight loss             ITP Comments:  ITP Comments     Row Name 05/21/21 1520 06/01/21 0940 06/08/21 0932 06/09/21 0607 07/07/21 0819   ITP Comments Initial telephone orientation completed. Diagnosis can be  found in [facility] 7/25. EP orientation scheduled for Tuesday 8/30 at 8 am. Completed 6MWT and gym orientation. Initial ITP created and sent for review to Dr. Ottie Glazier, Medical Director. First full day of exercise!  Patient  was oriented to gym and equipment including functions, settings, policies, and procedures.  Patient's individual exercise prescription and treatment plan were reviewed.  All starting workloads were established based on the results of the 6 minute walk test done at initial orientation visit.  The plan for exercise progression was also introduced and progression will be customized based on patient's performance and goals. 30 Day review completed. Medical Director ITP review done, changes made as directed, and signed approval by Medical Director.   New 30 day review completed. ITP sent to Dr. Zetta Bills, Medical Director of Pulmonary Rehab. Continue with ITP unless changes are made by physician.    [place] Name 07/20/21 [phone] 08/04/21 0644 09/01/21 0639       ITP Comments Completed initial RD consultation 30 Day review completed. Medical Director ITP review done, changes made as directed, and signed approval by Medical Director. 30 Day review completed. Medical Director ITP review done, changes made as directed, and signed approval by Medical Director.              Comments:

## 2021-09-02 ENCOUNTER — Encounter: Payer: BC Managed Care – PPO | Attending: Pulmonary Disease

## 2021-09-02 ENCOUNTER — Other Ambulatory Visit: Payer: Self-pay

## 2021-09-02 DIAGNOSIS — J9601 Acute respiratory failure with hypoxia: Secondary | ICD-10-CM | POA: Insufficient documentation

## 2021-09-02 NOTE — Progress Notes (Signed)
Daily Session Note  Patient Details  Name: Danny Norris MRN: [id number] Date of Birth: [birth year] Referring Provider:   Flowsheet Row Pulmonary Rehab from 06/01/2021 in [facility] Cardiac and Pulmonary Rehab  Referring Provider [name], MD       Encounter Date: 09/02/2021  Check In:  Session Check In - 09/02/21 0917       Check-In   Supervising physician immediately available to respond to emergencies See telemetry face sheet for immediately available ER MD    Location [facility]-Cardiac & Pulmonary Rehab    Staff Present Birdie Sons, MPA, Elveria Rising, BA, ACSM CEP, Exercise Physiologist;Jazae Gandolfi Amedeo Plenty, BS, ACSM CEP, Exercise Physiologist    Virtual Visit No    Medication changes reported     No    Fall or balance concerns reported    No    Tobacco Cessation No Change    Warm-up and Cool-down Performed on first and last piece of equipment    Resistance Training Performed Yes    VAD Patient? No    PAD/SET Patient? No      Pain Assessment   Currently in Pain? No/denies                Social History   Tobacco Use  Smoking Status Never  Smokeless Tobacco Never    Goals Met:  Independence with exercise equipment Exercise tolerated well No report of concerns or symptoms today Strength training completed today  Goals Unmet:  Not Applicable  Comments: Pt able to follow exercise prescription today without complaint.  Will continue to monitor for progression.    Dr. Emily Filbert is Medical Director for [place].  Dr. Ottie Glazier is Medical Director for [facility] Pulmonary Rehabilitation.

## 2021-09-07 ENCOUNTER — Encounter: Payer: BC Managed Care – PPO | Admitting: *Deleted

## 2021-09-07 ENCOUNTER — Other Ambulatory Visit: Payer: Self-pay

## 2021-09-07 DIAGNOSIS — J9601 Acute respiratory failure with hypoxia: Secondary | ICD-10-CM

## 2021-09-07 NOTE — Progress Notes (Signed)
Daily Session Note  Patient Details  Name: Danny Norris MRN: [id number] Date of Birth: 05/16/1961 Referring Provider:   Flowsheet Row Pulmonary Rehab from 06/01/2021 in [facility] Cardiac and Pulmonary Rehab  Referring Provider Dwald, Jonathan, MD       Encounter Date: 09/07/2021  Check In:      Social History   Tobacco Use  Smoking Status Never  Smokeless Tobacco Never    Goals Met:  Independence with exercise equipment Exercise tolerated well No report of concerns or symptoms today  Goals Unmet:  Not Applicable  Comments: Pt able to follow exercise prescription today without complaint.  Will continue to monitor for progression.    Dr. Mark Miller is Medical Director for [facility].  Dr. Fuad Aleskerov is Medical Director for [facility] Pulmonary Rehabilitation. 

## 2021-09-09 ENCOUNTER — Other Ambulatory Visit: Payer: Self-pay

## 2021-09-09 DIAGNOSIS — J9601 Acute respiratory failure with hypoxia: Secondary | ICD-10-CM

## 2021-09-09 NOTE — Progress Notes (Signed)
Daily Session Note  Patient Details  Name: Danny Norris MRN: [id number] Date of Birth: 04-07-1961 Referring Provider:   Flowsheet Row Pulmonary Rehab from 06/01/2021 in [facility] Cardiac and Pulmonary Rehab  Referring Provider [name], MD       Encounter Date: 09/09/2021  Check In:  Session Check In - 09/09/21 0907       Check-In   Supervising physician immediately available to respond to emergencies See telemetry face sheet for immediately available ER MD    Location [facility]-Cardiac & Pulmonary Rehab    Staff Present Birdie Sons, MPA, Elveria Rising, BA, ACSM CEP, Exercise Physiologist;Charron Coultas Amedeo Plenty, BS, ACSM CEP, Exercise Physiologist    Virtual Visit No    Medication changes reported     No    Fall or balance concerns reported    No    Tobacco Cessation No Change    Warm-up and Cool-down Performed on first and last piece of equipment    Resistance Training Performed Yes    VAD Patient? No    PAD/SET Patient? No      Pain Assessment   Currently in Pain? No/denies                Social History   Tobacco Use  Smoking Status Never  Smokeless Tobacco Never    Goals Met:  Independence with exercise equipment Exercise tolerated well No report of concerns or symptoms today Strength training completed today  Goals Unmet:  Not Applicable  Comments: Pt able to follow exercise prescription today without complaint.  Will continue to monitor for progression.    Dr. Emily Filbert is Medical Director for [place].  Dr. Ottie Glazier is Medical Director for [facility] Pulmonary Rehabilitation.

## 2021-09-14 ENCOUNTER — Other Ambulatory Visit: Payer: Self-pay

## 2021-09-14 DIAGNOSIS — J9601 Acute respiratory failure with hypoxia: Secondary | ICD-10-CM

## 2021-09-14 NOTE — Progress Notes (Signed)
Daily Session Note  Patient Details  Name: Danny Norris MRN: [id number] Date of Birth: 16-Feb-1961 Referring Provider:   Flowsheet Row Pulmonary Rehab from 06/01/2021 in [facility] Cardiac and Pulmonary Rehab  Referring Provider [name], MD       Encounter Date: 09/14/2021  Check In:  Session Check In - 09/14/21 0916       Check-In   Supervising physician immediately available to respond to emergencies See telemetry face sheet for immediately available ER MD    Location [facility]-Cardiac & Pulmonary Rehab    Staff Present Birdie Sons, MPA, RN;Jessica [place], MA, RCEP, CCRP, CCET;Amanda Sommer, BA, ACSM CEP, Exercise Physiologist    Virtual Visit No    Medication changes reported     No    Fall or balance concerns reported    No    Tobacco Cessation No Change    Warm-up and Cool-down Performed on first and last piece of equipment    Resistance Training Performed Yes    VAD Patient? No    PAD/SET Patient? No      Pain Assessment   Currently in Pain? No/denies                Social History   Tobacco Use  Smoking Status Never  Smokeless Tobacco Never    Goals Met:  Independence with exercise equipment Exercise tolerated well No report of concerns or symptoms today Strength training completed today  Goals Unmet:  Not Applicable  Comments: Pt able to follow exercise prescription today without complaint.  Will continue to monitor for progression.    Dr. Emily Filbert is Medical Director for [place].  Dr. Ottie Glazier is Medical Director for [facility] Pulmonary Rehabilitation.

## 2021-09-16 ENCOUNTER — Other Ambulatory Visit: Payer: Self-pay

## 2021-09-16 DIAGNOSIS — J9601 Acute respiratory failure with hypoxia: Secondary | ICD-10-CM | POA: Diagnosis not present

## 2021-09-16 NOTE — Progress Notes (Signed)
Daily Session Note  Patient Details  Name: Danny Norris MRN: [id number] Date of Birth: [birth year] Referring Provider:   Flowsheet Row Pulmonary Rehab from 06/01/2021 in [facility] Cardiac and Pulmonary Rehab  Referring Provider [name], MD       Encounter Date: 09/16/2021  Check In:  Session Check In - 09/16/21 0909       Check-In   Supervising physician immediately available to respond to emergencies See telemetry face sheet for immediately available ER MD    Location [facility]-Cardiac & Pulmonary Rehab    Staff Present Birdie Sons, MPA, Elveria Rising, BA, ACSM CEP, Exercise Physiologist;Kvon Mcilhenny Amedeo Plenty, BS, ACSM CEP, Exercise Physiologist    Virtual Visit No    Medication changes reported     No    Fall or balance concerns reported    No    Tobacco Cessation No Change    Warm-up and Cool-down Performed on first and last piece of equipment    Resistance Training Performed Yes    VAD Patient? No    PAD/SET Patient? No      Pain Assessment   Currently in Pain? No/denies                Social History   Tobacco Use  Smoking Status Never  Smokeless Tobacco Never    Goals Met:  Independence with exercise equipment Exercise tolerated well No report of concerns or symptoms today Strength training completed today  Goals Unmet:  Not Applicable  Comments: Pt able to follow exercise prescription today without complaint.  Will continue to monitor for progression.    Dr. Emily Filbert is Medical Director for [place].  Dr. Ottie Glazier is Medical Director for [facility] Pulmonary Rehabilitation.

## 2021-09-21 ENCOUNTER — Other Ambulatory Visit: Payer: Self-pay

## 2021-09-21 DIAGNOSIS — J9601 Acute respiratory failure with hypoxia: Secondary | ICD-10-CM

## 2021-09-21 NOTE — Progress Notes (Signed)
Daily Session Note  Patient Details  Name: Danny Norris MRN: [id number] Date of Birth: 04-Jul-1961 Referring Provider:   Flowsheet Row Pulmonary Rehab from 06/01/2021 in [facility] Cardiac and Pulmonary Rehab  Referring Provider [name], MD       Encounter Date: 09/21/2021  Check In:  Session Check In - 09/21/21 0921       Check-In   Supervising physician immediately available to respond to emergencies See telemetry face sheet for immediately available ER MD    Location [facility]-Cardiac & Pulmonary Rehab    Staff Present Birdie Sons, MPA, RN;Amanda Oletta Darter, BA, ACSM CEP, Exercise Physiologist;Jessica Luan [place], MA, RCEP, CCRP, CCET    Virtual Visit No    Medication changes reported     No    Fall or balance concerns reported    No    Tobacco Cessation No Change    Warm-up and Cool-down Performed on first and last piece of equipment    Resistance Training Performed Yes    VAD Patient? No    PAD/SET Patient? No      Pain Assessment   Currently in Pain? No/denies                Social History   Tobacco Use  Smoking Status Never  Smokeless Tobacco Never    Goals Met:  Independence with exercise equipment Exercise tolerated well No report of concerns or symptoms today Strength training completed today  Goals Unmet:  Not Applicable  Comments: Pt able to follow exercise prescription today without complaint.  Will continue to monitor for progression.    Dr. Emily Filbert is Medical Director for [place].  Dr. Ottie Glazier is Medical Director for [facility] Pulmonary Rehabilitation.

## 2021-09-23 ENCOUNTER — Other Ambulatory Visit: Payer: Self-pay

## 2021-09-23 VITALS — Ht 72.0 in | Wt 245.4 lb

## 2021-09-23 DIAGNOSIS — J9601 Acute respiratory failure with hypoxia: Secondary | ICD-10-CM | POA: Diagnosis not present

## 2021-09-23 NOTE — Progress Notes (Signed)
Daily Session Note  Patient Details  Name: Danny Norris MRN: [id number] Date of Birth: 11/06/1960 Referring Provider:   Flowsheet Row Pulmonary Rehab from 06/01/2021 in [facility] Cardiac and Pulmonary Rehab  Referring Provider [name], MD       Encounter Date: 09/23/2021  Check In:  Session Check In - 09/23/21 0911       Check-In   Supervising physician immediately available to respond to emergencies See telemetry face sheet for immediately available ER MD    Location [facility]-Cardiac & Pulmonary Rehab    Staff Present Birdie Sons, MPA, Elveria Rising, BA, ACSM CEP, Exercise Physiologist;Michai Dieppa Amedeo Plenty, BS, ACSM CEP, Exercise Physiologist    Virtual Visit No    Medication changes reported     No    Fall or balance concerns reported    No    Tobacco Cessation No Change    Warm-up and Cool-down Performed on first and last piece of equipment    Resistance Training Performed Yes    VAD Patient? No    PAD/SET Patient? No      Pain Assessment   Currently in Pain? No/denies                Social History   Tobacco Use  Smoking Status Never  Smokeless Tobacco Never    Goals Met:  Independence with exercise equipment Exercise tolerated well No report of concerns or symptoms today Strength training completed today  Goals Unmet:  Not Applicable  Comments: Pt able to follow exercise prescription today without complaint.  Will continue to monitor for progression.   [place] Name 06/01/21 0947 09/23/21 0940       6 Minute Walk   Phase Initial Discharge    Distance 1385 feet 1350 feet    Distance % Change -- -3 %    Distance Feet Change -- -35 ft    Walk Time 6 minutes 6 minutes    # of Rest Breaks 0 0    MPH 2.62 2.56    METS 3.73 4    RPE 13 11    Perceived Dyspnea  2 1    VO2 Peak 13.07 14.01    Symptoms Yes (comment) No    Comments SOB --    Resting HR 62 bpm 104 bpm    Resting BP 140/82 138/78    Resting Oxygen Saturation  96 % 97  %    Exercise Oxygen Saturation  during 6 min walk 91 % 91 %    Max Ex. HR 106 bpm 127 bpm    Max Ex. BP 166/84 180/88    2 Minute Post BP 144/84 148/82      Interval HR   1 Minute HR 79 110    2 Minute HR 96 106    3 Minute HR 101 116    4 Minute HR 103 127    5 Minute HR 106 126    6 Minute HR 105 127    2 Minute Post HR 67 111    Interval Heart Rate? Yes Yes      Interval Oxygen   Interval Oxygen? Yes Yes    Baseline Oxygen Saturation % 96 % 97 %    1 Minute Oxygen Saturation % 93 % 93 %    1 Minute Liters of Oxygen 2 L 2 L    2 Minute Oxygen Saturation % 92 % 91 %    2 Minute Liters of Oxygen  2 L 2 L    3 Minute Oxygen Saturation % 93 % 92 %    3 Minute Liters of Oxygen 2 L 2 L    4 Minute Oxygen Saturation % 92 % 92 %    4 Minute Liters of Oxygen 2 L 2 L    5 Minute Oxygen Saturation % 91 % 92 %    5 Minute Liters of Oxygen 2 L 2 L    6 Minute Oxygen Saturation % 92 % 92 %    6 Minute Liters of Oxygen 2 L 2 L    2 Minute Post Oxygen Saturation % 94 % 94 %    2 Minute Post Liters of Oxygen 2 L 2 L               Dr. Emily Filbert is Medical Director for [place].  Dr. Ottie Glazier is Medical Director for [facility] Pulmonary Rehabilitation.

## 2021-09-28 ENCOUNTER — Encounter: Payer: BC Managed Care – PPO | Admitting: *Deleted

## 2021-09-28 ENCOUNTER — Other Ambulatory Visit: Payer: Self-pay

## 2021-09-28 DIAGNOSIS — J9601 Acute respiratory failure with hypoxia: Secondary | ICD-10-CM | POA: Diagnosis not present

## 2021-09-28 NOTE — Progress Notes (Signed)
Daily Session Note  Patient Details  Name: Danny Norris MRN: [id number] Date of Birth: [birth year] Referring Provider:   Flowsheet Row Pulmonary Rehab from 06/01/2021 in [facility] Cardiac and Pulmonary Rehab  Referring Provider [name], MD       Encounter Date: 09/28/2021  Check In:  Session Check In - 09/28/21 1009       Check-In   Supervising physician immediately available to respond to emergencies See telemetry face sheet for immediately available ER MD    Location [facility]-Cardiac & Pulmonary Rehab    Staff Present Heath Lark, RN, BSN, Lance Sell, BA, ACSM CEP, Exercise Physiologist;Laureen Owens Shark, BS, RRT, CPFT    Virtual Visit No    Medication changes reported     No    Fall or balance concerns reported    No    Warm-up and Cool-down Performed on first and last piece of equipment    Resistance Training Performed Yes    VAD Patient? No    PAD/SET Patient? No      Pain Assessment   Currently in Pain? No/denies                Social History   Tobacco Use  Smoking Status Never  Smokeless Tobacco Never    Goals Met:  Proper associated with RPD/PD & O2 Sat Independence with exercise equipment Exercise tolerated well No report of concerns or symptoms today  Goals Unmet:  Not Applicable  Comments: Pt able to follow exercise prescription today without complaint.  Will continue to monitor for progression.    Dr. Emily Filbert is Medical Director for [place].  Dr. Ottie Glazier is Medical Director for [facility] Pulmonary Rehabilitation.

## 2021-09-29 ENCOUNTER — Encounter: Payer: Self-pay | Admitting: *Deleted

## 2021-09-29 DIAGNOSIS — J9601 Acute respiratory failure with hypoxia: Secondary | ICD-10-CM

## 2021-09-29 NOTE — Progress Notes (Signed)
Pulmonary Individual Treatment Plan  Patient Details  Name: Danny Norris MRN: [id number] Date of Birth: 1961/09/12 Referring Provider:   Flowsheet Row Pulmonary Rehab from 06/01/2021 in [facility] Cardiac and Pulmonary Rehab  Referring Provider [name], MD       Initial Encounter Date:  Flowsheet Row Pulmonary Rehab from 06/01/2021 in [facility] Cardiac and Pulmonary Rehab  Date 06/01/21       Visit Diagnosis: Acute respiratory failure with hypoxia ([place])  Patient's Home Medications on Admission:  Current Outpatient Medications:    apixaban (ELIQUIS) 5 MG TABS tablet, Apixaban 10 mg po bid x 3 days, than 5 mg po bid, Disp: 66 tablet, Rfl: 1   atorvastatin (LIPITOR) 20 MG tablet, Take 1 tablet (20 mg total) by mouth daily at 6 PM., Disp: 30 tablet, Rfl: 6   docusate sodium (COLACE) 100 MG capsule, Take 1 capsule (100 mg total) by mouth 2 (two) times daily., Disp: 10 capsule, Rfl: 0   feeding supplement (ENSURE ENLIVE / ENSURE PLUS) LIQD, Take 237 mLs by mouth 2 (two) times daily between meals., Disp: 237 mL, Rfl: 12   hydrochlorothiazide (HYDRODIURIL) 25 MG tablet, TAKE 1 TABLET BY MOUTH EVERY DAY (Patient taking differently: Take 25 mg by mouth daily.), Disp: 90 tablet, Rfl: 1   metoprolol succinate (TOPROL-XL) 50 MG 24 hr tablet, TAKE 1 TABLET BY MOUTH DAILY WITH OR IMMEDIATELY FOLLOWING A MEAL (Patient taking differently: Take 50 mg by mouth daily at 6 (six) AM.), Disp: 90 tablet, Rfl: 1   polyethylene glycol (MIRALAX / GLYCOLAX) 17 g packet, Take 17 g by mouth daily., Disp: 14 each, Rfl: 0  Past Medical History: Past Medical History:  Diagnosis Date   Dyslipidemia    HTN (hypertension)    Mitral valve regurgitation    a. 05/2011: TEE showing significant MVP especially involving the P2 segment without evidence for ruptured chordae and had eccentric MR anteriorly directed. b. 03/2015: echo showing EF 65-70% with posterior MV leaflet thickening and prolapse with mild MR.   Normal  coronary arteries    a. normal cors by cath in 08/2011    Tobacco Use: Social History   Tobacco Use  Smoking Status Never  Smokeless Tobacco Never    Labs: Recent Review Flowsheet Data     Labs for ITP Cardiac and Pulmonary Rehab Latest Ref Rng & Units 08/08/2019 12/24/2019 02/19/2021 02/20/2021 02/21/2021   Cholestrol 100 - 199 mg/dL 167 132 - - -   LDLCALC 0 - 99 mg/dL 110(H) 79 - - -   HDL >39 mg/dL 40 37(L) - - -   Trlycerides <150 mg/dL 89 81 - 93 -   Hemoglobin A1c 4.8 - 5.6 % - - 6.3(H) - -   PHART 7.350 - 7.450 - - 7.383 7.576(H) 7.463(H)   PCO2ART 32.0 - 48.0 mmHg - - 57.5(H) 31.0(L) 41.2   HCO3 20.0 - 28.0 mmol/L - - 34.0(H) 28.8(H) 29.5(H)   TCO2 22 - 32 mmol/L - - 36(H) - 31   ACIDBASEDEF 0.0 - 2.0 mmol/L - - - - -   O2SAT % - - 99.0 99.2 92.0        Pulmonary Assessment Scores:  Pulmonary Assessment Scores     Row Name 06/01/21 0950         ADL UCSD   ADL Phase Entry     SOB Score total 18     Rest 0     Walk 2     Stairs 1  Bath 0     Dress 0     Shop 1       CAT Score   CAT Score 7       mMRC Score   mMRC Score 1              UCSD: Self-administered rating of dyspnea associated with activities of daily living (ADLs) 6-point scale (0 = "not at all" to 5 = "maximal or unable to do because of breathlessness")  Scoring Scores range from 0 to 120.  Minimally important difference is 5 units  CAT: CAT can identify the health impairment of COPD patients and is better correlated with disease progression.  CAT has a scoring range of zero to 40. The CAT score is classified into four groups of low (less than 10), medium (10 - 20), high (21-30) and very high (31-40) based on the impact level of disease on health status. A CAT score over 10 suggests significant symptoms.  A worsening CAT score could be explained by an exacerbation, poor medication adherence, poor inhaler technique, or progression of COPD or comorbid conditions.  CAT MCID is 2  points  mMRC: mMRC (Modified Medical Research Council) Dyspnea Scale is used to assess the degree of baseline functional disability in patients of respiratory disease due to dyspnea. No minimal important difference is established. A decrease in score of 1 point or greater is considered a positive change.   Pulmonary Function Assessment:   Exercise Target Goals: Exercise Program Goal: Individual exercise prescription set using results from initial 6 min walk test and THRR while considering  patients activity barriers and safety.   Exercise Prescription Goal: Initial exercise prescription builds to 30-45 minutes a day of aerobic activity, 2-3 days per week.  Home exercise guidelines will be given to patient during program as part of exercise prescription that the participant will acknowledge.  Education: Aerobic Exercise: - Group verbal and visual presentation on the components of exercise prescription. Introduces F.I.T.T principle from ACSM for exercise prescriptions.  Reviews F.I.T.T. principles of aerobic exercise including progression. Written material given at graduation. Flowsheet Row Pulmonary Rehab from 09/16/2021 in [facility] Cardiac and Pulmonary Rehab  Date 07/29/21  Educator [facility]  Instruction Review Code 1- Verbalizes Understanding       Education: Resistance Exercise: - Group verbal and visual presentation on the components of exercise prescription. Introduces F.I.T.T principle from ACSM for exercise prescriptions  Reviews F.I.T.T. principles of resistance exercise including progression. Written material given at graduation. Flowsheet Row Pulmonary Rehab from 09/16/2021 in [facility] Cardiac and Pulmonary Rehab  Date 08/05/21  Educator as  Instruction Review Code 1- Verbalizes Understanding        Education: Exercise & Equipment Safety: - Individual verbal instruction and demonstration of equipment use and safety with use of the equipment. Flowsheet Row Pulmonary Rehab from  09/16/2021 in [facility] Cardiac and Pulmonary Rehab  Education need identified 06/01/21  Date 06/01/21  Educator [place]  Instruction Review Code 1- Verbalizes Understanding       Education: Exercise Physiology & General Exercise Guidelines: - Group verbal and written instruction with models to review the exercise physiology of the cardiovascular system and associated critical values. Provides general exercise guidelines with specific guidelines to those with heart or lung disease.  Flowsheet Row Pulmonary Rehab from 09/16/2021 in [facility] Cardiac and Pulmonary Rehab  Date 07/22/21  Educator [facility]  Instruction Review Code 1- Verbalizes Understanding       Education: Flexibility, Balance, Mind/Body Relaxation: - Group verbal  and visual presentation with interactive activity on the components of exercise prescription. Introduces F.I.T.T principle from ACSM for exercise prescriptions. Reviews F.I.T.T. principles of flexibility and balance exercise training including progression. Also discusses the mind body connection.  Reviews various relaxation techniques to help reduce and manage stress (i.e. Deep breathing, progressive muscle relaxation, and visualization). Balance handout provided to take home. Written material given at graduation. Flowsheet Row Pulmonary Rehab from 09/16/2021 in [facility] Cardiac and Pulmonary Rehab  Date 06/10/21  Educator AS  Instruction Review Code 1- Verbalizes Understanding       Activity Barriers & Risk Stratification:  Activity Barriers & Cardiac Risk Stratification - 06/01/21 0944       Activity Barriers & Cardiac Risk Stratification   Activity Barriers Shortness of Breath;Joint Problems;Deconditioning             6 Minute Walk:  6 Minute Walk     Row Name 06/01/21 0947 09/23/21 0940       6 Minute Walk   Phase Initial Discharge    Distance 1385 feet 1350 feet    Distance % Change -- -3 %    Distance Feet Change -- -35 ft    Walk Time 6 minutes 6 minutes     # of Rest Breaks 0 0    MPH 2.62 2.56    METS 3.73 4    RPE 13 11    Perceived Dyspnea  2 1    VO2 Peak 13.07 14.01    Symptoms Yes (comment) No    Comments SOB --    Resting HR 62 bpm 104 bpm    Resting BP 140/82 138/78    Resting Oxygen Saturation  96 % 97 %    Exercise Oxygen Saturation  during 6 min walk 91 % 91 %    Max Ex. HR 106 bpm 127 bpm    Max Ex. BP 166/84 180/88    2 Minute Post BP 144/84 148/82      Interval HR   1 Minute HR 79 110    2 Minute HR 96 106    3 Minute HR 101 116    4 Minute HR 103 127    5 Minute HR 106 126    6 Minute HR 105 127    2 Minute Post HR 67 111    Interval Heart Rate? Yes Yes      Interval Oxygen   Interval Oxygen? Yes Yes    Baseline Oxygen Saturation % 96 % 97 %    1 Minute Oxygen Saturation % 93 % 93 %    1 Minute Liters of Oxygen 2 L 2 L    2 Minute Oxygen Saturation % 92 % 91 %    2 Minute Liters of Oxygen 2 L 2 L    3 Minute Oxygen Saturation % 93 % 92 %    3 Minute Liters of Oxygen 2 L 2 L    4 Minute Oxygen Saturation % 92 % 92 %    4 Minute Liters of Oxygen 2 L 2 L    5 Minute Oxygen Saturation % 91 % 92 %    5 Minute Liters of Oxygen 2 L 2 L    6 Minute Oxygen Saturation % 92 % 92 %    6 Minute Liters of Oxygen 2 L 2 L    2 Minute Post Oxygen Saturation % 94 % 94 %    2 Minute Post Liters of Oxygen 2 L 2  L            Oxygen Initial Assessment:  Oxygen Initial Assessment - 06/01/21 0949       Home Oxygen   Home Oxygen Device E-Tanks    Sleep Oxygen Prescription Continuous    Liters per minute 2    Home Exercise Oxygen Prescription None    Home Resting Oxygen Prescription None    Compliance with Home Oxygen Use Yes      Initial 6 min Walk   Oxygen Used Continuous    Liters per minute 2      Program Oxygen Prescription   Program Oxygen Prescription Continuous    Liters per minute 2      Intervention   Short Term Goals To learn and understand importance of monitoring SPO2 with pulse oximeter and  demonstrate accurate use of the pulse oximeter.;To learn and understand importance of maintaining oxygen saturations>88%;To learn and exhibit compliance with exercise, home and travel O2 prescription;To learn and demonstrate proper pursed lip breathing techniques or other breathing techniques. ;To learn and demonstrate proper use of respiratory medications    Long  Term Goals Exhibits compliance with exercise, home  and travel O2 prescription;Verbalizes importance of monitoring SPO2 with pulse oximeter and return demonstration;Maintenance of O2 saturations>88%;Compliance with respiratory medication;Exhibits proper breathing techniques, such as pursed lip breathing or other method taught during program session;Demonstrates proper use of MDIs             Oxygen Re-Evaluation:  Oxygen Re-Evaluation     Row Name 06/08/21 0933 06/17/21 0950 07/22/21 0956 08/17/21 0935 09/09/21 0934     Program Oxygen Prescription   Program Oxygen Prescription Continuous Continuous Continuous;E-Tanks Continuous;E-Tanks Continuous;E-Tanks   Liters per minute 2 2 2 2 2      Home Oxygen   Home Oxygen Device E-Tanks E-Tanks E-Tanks E-Tanks E-Tanks   Sleep Oxygen Prescription Continuous Continuous;CPAP Continuous;CPAP Continuous;CPAP Continuous;CPAP   Liters per minute 2 2  recommended 2 2 2    Home Exercise Oxygen Prescription None None  not currently prescribed None None None   Home Resting Oxygen Prescription None None None None None   Compliance with Home Oxygen Use Yes Yes Yes Yes Yes     Goals/Expected Outcomes   Short Term Goals To learn and understand importance of monitoring SPO2 with pulse oximeter and demonstrate accurate use of the pulse oximeter.;To learn and understand importance of maintaining oxygen saturations>88%;To learn and exhibit compliance with exercise, home and travel O2 prescription;To learn and demonstrate proper pursed lip breathing techniques or other breathing techniques.  To learn and  understand importance of monitoring SPO2 with pulse oximeter and demonstrate accurate use of the pulse oximeter.;To learn and understand importance of maintaining oxygen saturations>88%;To learn and exhibit compliance with exercise, home and travel O2 prescription;To learn and demonstrate proper pursed lip breathing techniques or other breathing techniques.  To learn and understand importance of monitoring SPO2 with pulse oximeter and demonstrate accurate use of the pulse oximeter.;To learn and understand importance of maintaining oxygen saturations>88%;To learn and exhibit compliance with exercise, home and travel O2 prescription;To learn and demonstrate proper pursed lip breathing techniques or other breathing techniques.  To learn and understand importance of monitoring SPO2 with pulse oximeter and demonstrate accurate use of the pulse oximeter.;To learn and understand importance of maintaining oxygen saturations>88%;To learn and exhibit compliance with exercise, home and travel O2 prescription;To learn and demonstrate proper pursed lip breathing techniques or other breathing techniques.  To learn and understand importance of monitoring SPO2  with pulse oximeter and demonstrate accurate use of the pulse oximeter.;To learn and understand importance of maintaining oxygen saturations>88%;To learn and exhibit compliance with exercise, home and travel O2 prescription;To learn and demonstrate proper pursed lip breathing techniques or other breathing techniques.    Long  Term Goals Exhibits compliance with exercise, home  and travel O2 prescription;Verbalizes importance of monitoring SPO2 with pulse oximeter and return demonstration;Maintenance of O2 saturations>88%;Exhibits proper breathing techniques, such as pursed lip breathing or other method taught during program session;Compliance with respiratory medication Exhibits compliance with exercise, home  and travel O2 prescription;Verbalizes importance of monitoring  SPO2 with pulse oximeter and return demonstration;Maintenance of O2 saturations>88%;Exhibits proper breathing techniques, such as pursed lip breathing or other method taught during program session;Compliance with respiratory medication Exhibits compliance with exercise, home  and travel O2 prescription;Verbalizes importance of monitoring SPO2 with pulse oximeter and return demonstration;Maintenance of O2 saturations>88%;Exhibits proper breathing techniques, such as pursed lip breathing or other method taught during program session;Compliance with respiratory medication Exhibits compliance with exercise, home  and travel O2 prescription;Verbalizes importance of monitoring SPO2 with pulse oximeter and return demonstration;Maintenance of O2 saturations>88%;Exhibits proper breathing techniques, such as pursed lip breathing or other method taught during program session;Compliance with respiratory medication Exhibits compliance with exercise, home  and travel O2 prescription;Verbalizes importance of monitoring SPO2 with pulse oximeter and return demonstration;Maintenance of O2 saturations>88%;Exhibits proper breathing techniques, such as pursed lip breathing or other method taught during program session;Compliance with respiratory medication   Comments Reviewed PLB technique with pt.  Talked about how it works and it's importance in maintaining their exercise saturations. Windsor is doing well with his oxygen in class and wearing it at night.  He has been prescribed a CPAP but has not gotten it yet.  We talked about monitoring his oxygen closely as he goes back to work this week to see if he needs oxygen while he is working.  He is planning to take his pulse oximeter with him.  Overall, he is doing well and uses his inhaler regularly.  He does not have a spacer but we will get him one soon. Diamond is doing well in rehab. He is doing well with his oxygen and CPAP.  He has gotten a new mask for his CPAP and now has  a better  seal with it.  He is doing well with his pursed lip breathing too. Jerrico is doing well in rehab.  His breathing is improving and he is using his PLB.  He is compliant with his CPAP.  He has not been using it for exercise at home, but does watch his sats closely while he is walking. Kaydan is doing well in rehab, his breathing continues to improves and he continues to practice PLB. He continues to use CPAP with no issues. Donnel reports when he saw his doctor yesterday, MD reported Purcell was doing well. He continues to track his O2 at home.   Goals/Expected Outcomes Short: Become more profiecient at using PLB.   Long: Become independent at using PLB. Short: Monitor oxygen levels at work Long:Continue to improve SOB and using PLB Short: Continue to use new mask Long: Conitnued compliance. Short: Continue to watch sats walking Long: COnitnued compliance Short: Continue to watch sats walking Long: Continued compliance            Oxygen Discharge (Final Oxygen Re-Evaluation):  Oxygen Re-Evaluation - 09/09/21 0934       Program Oxygen Prescription   Program Oxygen Prescription Continuous;E-Tanks  Liters per minute 2      Home Oxygen   Home Oxygen Device E-Tanks    Sleep Oxygen Prescription Continuous;CPAP    Liters per minute 2    Home Exercise Oxygen Prescription None    Home Resting Oxygen Prescription None    Compliance with Home Oxygen Use Yes      Goals/Expected Outcomes   Short Term Goals To learn and understand importance of monitoring SPO2 with pulse oximeter and demonstrate accurate use of the pulse oximeter.;To learn and understand importance of maintaining oxygen saturations>88%;To learn and exhibit compliance with exercise, home and travel O2 prescription;To learn and demonstrate proper pursed lip breathing techniques or other breathing techniques.     Long  Term Goals Exhibits compliance with exercise, home  and travel O2 prescription;Verbalizes importance of monitoring SPO2 with pulse  oximeter and return demonstration;Maintenance of O2 saturations>88%;Exhibits proper breathing techniques, such as pursed lip breathing or other method taught during program session;Compliance with respiratory medication    Comments Aerion is doing well in rehab, his breathing continues to improves and he continues to practice PLB. He continues to use CPAP with no issues. Jamontae reports when he saw his doctor yesterday, MD reported Rohil was doing well. He continues to track his O2 at home.    Goals/Expected Outcomes Short: Continue to watch sats walking Long: Continued compliance             Initial Exercise Prescription:  Initial Exercise Prescription - 06/01/21 1000       Date of Initial Exercise RX and Referring Provider   Date 06/01/21    Referring Provider [name], MD      Oxygen   Oxygen Continuous    Liters 2    Maintain Oxygen Saturation 88% or higher      Treadmill   MPH 2.3    Grade 0.5    Minutes 15    METs 2.92      Recumbant Bike   Level 2    RPM 60    Watts 30    Minutes 15    METs 3.7      REL-XR   Level 2    Speed 50    Minutes 15    METs 3.7      Prescription Details   Frequency (times per week) 2    Duration Progress to 30 minutes of continuous aerobic without signs/symptoms of physical distress      Intensity   THRR 40-80% of Max Heartrate 101-141    Ratings of Perceived Exertion 11-13    Perceived Dyspnea 0-4      Progression   Progression Continue to progress workloads to maintain intensity without signs/symptoms of physical distress.      Resistance Training   Training Prescription Yes    Weight 4 lb    Reps 10-15             Perform Capillary Blood Glucose checks as needed.  Exercise Prescription Changes:   Exercise Prescription Changes     Row Name 06/01/21 1000 06/10/21 1500 06/17/21 0900 06/22/21 1400 07/05/21 1500     Response to Exercise   Blood Pressure (Admit) 140/82 122/68 -- 126/64 138/68   Blood Pressure  (Exercise) 166/84 172/78 -- 120/64 146/82   Blood Pressure (Exit) 144/84 124/78 -- 114/66 124/70   Heart Rate (Admit) 62 bpm 68 bpm -- 74 bpm 69 bpm   Heart Rate (Exercise) 106 bpm 92 bpm -- 105 bpm 84 bpm  Heart Rate (Exit) 67 bpm 76 bpm -- 90 bpm 77 bpm   Oxygen Saturation (Admit) 96 % 94 % -- 91 % 94 %   Oxygen Saturation (Exercise) 91 % 93 % -- 93 % 92 %   Oxygen Saturation (Exit) 94 % 94 % -- 94 % 91 %   Rating of Perceived Exertion (Exercise) 13 12 -- 12 12   Perceived Dyspnea (Exercise) 2 2 -- 2 2   Symptoms SOB SOB -- -- SOB   Comments walk test results first full day exercise -- -- --   Duration -- Progress to 30 minutes of  aerobic without signs/symptoms of physical distress -- Continue with 30 min of aerobic exercise without signs/symptoms of physical distress. Continue with 30 min of aerobic exercise without signs/symptoms of physical distress.   Intensity -- THRR unchanged -- THRR unchanged THRR unchanged     Progression   Progression -- Continue to progress workloads to maintain intensity without signs/symptoms of physical distress. -- Continue to progress workloads to maintain intensity without signs/symptoms of physical distress. Continue to progress workloads to maintain intensity without signs/symptoms of physical distress.   Average METs -- 2.82 -- 2.82 3.08     Resistance Training   Training Prescription -- Yes -- Yes Yes   Weight -- 4 lb -- 4 lb 4 lb   Reps -- 10-15 -- 10-15 10-15     Interval Training   Interval Training -- -- -- -- No     Oxygen   Oxygen -- Continuous -- Continuous Continuous   Liters -- 2 -- 2 2     Treadmill   MPH -- 2.3 -- 2.3 2.3   Grade -- 0.5 -- 0.5 0.5   Minutes -- 15 -- 15 15   METs -- 2.92 -- 2.92 2.92     Recumbant Bike   Level -- 2 -- 3 3   Watts -- 25 -- 25 16   Minutes -- 15 -- 15 15   METs -- 2.73 -- 2.72 2.55     REL-XR   Level -- -- -- -- 2   Minutes -- -- -- -- 15   METs -- -- -- -- 3.9     Home Exercise Plan    Plans to continue exercise at -- -- Home (comment)  walking, staff videos Home (comment)  walking, staff videos Home (comment)  walking, staff videos   Frequency -- -- Add 2 additional days to program exercise sessions. Add 2 additional days to program exercise sessions. Add 2 additional days to program exercise sessions.   Initial Home Exercises Provided -- -- 06/17/21 06/17/21 06/17/21     Oxygen   Maintain Oxygen Saturation -- 88% or higher -- -- 88% or higher    Row Name 08/02/21 1000 08/17/21 1400 08/31/21 1400 09/13/21 1000       Response to Exercise   Blood Pressure (Admit) 124/62 122/72 126/64 122/60    Blood Pressure (Exercise) 172/62 -- -- --    Blood Pressure (Exit) 128/72 112/58 104/56 112/58    Heart Rate (Admit) 75 bpm 67 bpm 64 bpm 74 bpm    Heart Rate (Exercise) 109 bpm 104 bpm 104 bpm 103 bpm    Heart Rate (Exit) 85 bpm 82 bpm 77 bpm 97 bpm    Oxygen Saturation (Admit) 92 % 95 % 96 % 93 %    Oxygen Saturation (Exercise) 93 % 93 % 92 % 91 %    Oxygen Saturation (Exit) 93 %  92 % 93 % 93 %    Rating of Perceived Exertion (Exercise) 13 12 11 12     Perceived Dyspnea (Exercise) 2 2 2 2     Symptoms SOB SOB SOB SOB    Duration Continue with 30 min of aerobic exercise without signs/symptoms of physical distress. Continue with 30 min of aerobic exercise without signs/symptoms of physical distress. Continue with 30 min of aerobic exercise without signs/symptoms of physical distress. Continue with 30 min of aerobic exercise without signs/symptoms of physical distress.    Intensity THRR unchanged THRR unchanged THRR unchanged THRR unchanged      Progression   Progression Continue to progress workloads to maintain intensity without signs/symptoms of physical distress. Continue to progress workloads to maintain intensity without signs/symptoms of physical distress. Continue to progress workloads to maintain intensity without signs/symptoms of physical distress. Continue to progress  workloads to maintain intensity without signs/symptoms of physical distress.    Average METs 4.03 4.4 3.72 4.26      Resistance Training   Training Prescription Yes Yes Yes Yes    Weight 4 lb 4 lb 4 lb 4 lb    Reps 10-15 10-15 10-15 10-15      Interval Training   Interval Training No No No No      Oxygen   Oxygen Continuous Continuous Continuous Continuous    Liters 2 2 2 2       Treadmill   MPH 2.9 3 3 3     Grade 4 4 4 4     Minutes 15 15 15 15     METs 4.82 4.95 4.95 4.95      Recumbant Bike   Level 7 9 9 12     Watts -- 55 60 54    Minutes 15 15 15 15     METs 2.71 3.86 3.72 3.57      REL-XR   Level 3 -- 6 8    Minutes 15 -- 15 15    METs 4.75 -- 2.5 --      Home Exercise Plan   Plans to continue exercise at Home (comment)  walking, staff videos Home (comment)  walking, staff videos Home (comment)  walking, staff videos Home (comment)  walking, staff videos    Frequency Add 2 additional days to program exercise sessions. Add 2 additional days to program exercise sessions. Add 2 additional days to program exercise sessions. Add 2 additional days to program exercise sessions.    Initial Home Exercises Provided 06/17/21 06/17/21 06/17/21 06/17/21      Oxygen   Maintain Oxygen Saturation 88% or higher 88% or higher 88% or higher 88% or higher             Exercise Comments:   Exercise Comments     Row Name 06/08/21 0932           Exercise Comments First full day of exercise!  Patient was oriented to gym and equipment including functions, settings, policies, and procedures.  Patient's individual exercise prescription and treatment plan were reviewed.  All starting workloads were established based on the results of the 6 minute walk test done at initial orientation visit.  The plan for exercise progression was also introduced and progression will be customized based on patient's performance and goals.                Exercise Goals and Review:   Exercise Goals      Row Name 06/01/21 1055  Exercise Goals   Increase Physical Activity Yes       Intervention Provide advice, education, support and counseling about physical activity/exercise needs.;Develop an individualized exercise prescription for aerobic and resistive training based on initial evaluation findings, risk stratification, comorbidities and participant's personal goals.       Expected Outcomes Short Term: Attend rehab on a regular basis to increase amount of physical activity.;Long Term: Add in home exercise to make exercise part of routine and to increase amount of physical activity.;Long Term: Exercising regularly at least 3-5 days a week.       Increase Strength and Stamina Yes       Intervention Provide advice, education, support and counseling about physical activity/exercise needs.;Develop an individualized exercise prescription for aerobic and resistive training based on initial evaluation findings, risk stratification, comorbidities and participant's personal goals.       Expected Outcomes Short Term: Increase workloads from initial exercise prescription for resistance, speed, and METs.;Short Term: Perform resistance training exercises routinely during rehab and add in resistance training at home;Long Term: Improve cardiorespiratory fitness, muscular endurance and strength as measured by increased METs and functional capacity (6MWT)       Able to understand and use rate of perceived exertion (RPE) scale Yes       Intervention Provide education and explanation on how to use RPE scale       Expected Outcomes Short Term: Able to use RPE daily in rehab to express subjective intensity level;Long Term:  Able to use RPE to guide intensity level when exercising independently       Able to understand and use Dyspnea scale Yes       Intervention Provide education and explanation on how to use Dyspnea scale       Expected Outcomes Short Term: Able to use Dyspnea scale daily in rehab to  express subjective sense of shortness of breath during exertion;Long Term: Able to use Dyspnea scale to guide intensity level when exercising independently       Knowledge and understanding of Target Heart Rate Range (THRR) Yes       Intervention Provide education and explanation of THRR including how the numbers were predicted and where they are located for reference       Expected Outcomes Short Term: Able to state/look up THRR;Long Term: Able to use THRR to govern intensity when exercising independently;Short Term: Able to use daily as guideline for intensity in rehab       Able to check pulse independently Yes       Intervention Provide education and demonstration on how to check pulse in carotid and radial arteries.;Review the importance of being able to check your own pulse for safety during independent exercise       Expected Outcomes Short Term: Able to explain why pulse checking is important during independent exercise;Long Term: Able to check pulse independently and accurately       Understanding of Exercise Prescription Yes       Intervention Provide education, explanation, and written materials on patient's individual exercise prescription       Expected Outcomes Short Term: Able to explain program exercise prescription;Long Term: Able to explain home exercise prescription to exercise independently                Exercise Goals Re-Evaluation :  Exercise Goals Re-Evaluation     Row Name 06/08/21 0932 06/10/21 1549 06/17/21 0937 06/22/21 1418 07/05/21 1542     Exercise Goal Re-Evaluation   Exercise  Goals Review Increase Physical Activity;Able to understand and use rate of perceived exertion (RPE) scale;Knowledge and understanding of Target Heart Rate Range (THRR);Understanding of Exercise Prescription;Increase Strength and Stamina;Able to understand and use Dyspnea scale;Able to check pulse independently Increase Physical Activity;Understanding of Exercise Prescription;Increase  Strength and Stamina Increase Physical Activity;Understanding of Exercise Prescription;Increase Strength and Stamina;Able to understand and use rate of perceived exertion (RPE) scale;Able to understand and use Dyspnea scale;Able to check pulse independently;Knowledge and understanding of Target Heart Rate Range (THRR) Increase Physical Activity;Increase Strength and Stamina Increase Physical Activity;Increase Strength and Stamina;Understanding of Exercise Prescription   Comments Reviewed RPE and dyspnea scales, THR and program prescription with pt today.  Pt voiced understanding and was given a copy of goals to take home. Gloria came for the first several sessions of rehab and has done well thus far. Staff will continue to monitor his progress as patient progresses through the program. Deklin is doing well in rehab. He is starting to feel like his stamina is starting to recover.  Reviewed home exercise with pt today.  Pt plans to walk at park and use staff videos at home for exercise.  Reviewed THR, pulse, RPE, sign and symptoms, pulse oximetery and when to call 911 or MD.  Also discussed weather considerations and indoor options.  Pt voiced understanding. Omar is tolerating exercise well in his first sessions.  He has rache dhis THR one session.  Staff will review THR range and encourage him to increase levels to get to THR range Korbin is doing well in rehab.  He is up to 3.9 METs on the XR.  We will continue to monitor his progress.   Expected Outcomes Short: Use RPE daily to regulate intensity. Long: Follow program prescription in THR. Short: Continue to follow exercise prescription Long: Continue to increase overall MET level Short: Start to add in walking at least one extra day at home Long: continue to improve stamina. Short: work in [employer] range Long:  improve overall stamina Short: Push treadmill back up to 2.3 mph Long: Conitnue to improve stamina    Row Name 07/22/21 0950 08/02/21 1032 08/17/21 0930 08/31/21  1358 09/09/21 0934     Exercise Goal Re-Evaluation   Exercise Goals Review Increase Physical Activity;Increase Strength and Stamina;Understanding of Exercise Prescription Increase Physical Activity;Increase Strength and Stamina Increase Physical Activity;Increase Strength and Stamina;Understanding of Exercise Prescription Increase Physical Activity;Increase Strength and Stamina;Understanding of Exercise Prescription Increase Physical Activity;Increase Strength and Stamina;Understanding of Exercise Prescription   Comments Sahir is doing well in rehab. He does get out at least 2 extra days to walk each week.  He would like ot do three days, but usually will find something comes up or weather doesn't work. He is feeling better with his strength and stamina and getting stronger. Sargent continues to do well in rehab. He has significantly increased his loads on the treadmill to 2.9 speed with a 4% incline and tolerating it well. His recumbant bike has increased to level 7. Will continue to monitor his progression. Nikolaj is doing well in rehab. He is walking some on his off days.  Yesterday, he went to the park to walk to enjoy the fall air.  He is starting to feel a little stronger now. Xayden is doing well in rehab.  He is up to 60 watts on the bike.  We will encourage him to try 5 lb weights.  We will continue to monitor his progress. He uses his stationary bike due to the  cold weather - he uses it about 2x/week for about 40-45 minutes. He also does a warm up and cool down.   Expected Outcomes Short: Add in more exercise at home.  Long: Continue to improve stamina. Short: Continue to work within [place]: Continue to increase overall MET level Short: Continue to exercise on off days Long: conitnue to improve stamina. Short: Try 5 lb weights Long: Continue to improve strength Short: continue to exercise while not at rehab Long: Continue to improve strength    Row Name 09/13/21 1032             Exercise Goal  Re-Evaluation   Exercise Goals Review Increase Physical Activity;Increase Strength and Stamina;Understanding of Exercise Prescription       Comments Icarus is doing well in rehab.  He is up to 54 watts at level 12 on the bike and 3.0 mph on the treadmill.  We will continue to monitor her progress.       Expected Outcomes Short: Try 5 lb weights Long: Continue to improve stamina                Discharge Exercise Prescription (Final Exercise Prescription Changes):  Exercise Prescription Changes - 09/13/21 1000       Response to Exercise   Blood Pressure (Admit) 122/60    Blood Pressure (Exit) 112/58    Heart Rate (Admit) 74 bpm    Heart Rate (Exercise) 103 bpm    Heart Rate (Exit) 97 bpm    Oxygen Saturation (Admit) 93 %    Oxygen Saturation (Exercise) 91 %    Oxygen Saturation (Exit) 93 %    Rating of Perceived Exertion (Exercise) 12    Perceived Dyspnea (Exercise) 2    Symptoms SOB    Duration Continue with 30 min of aerobic exercise without signs/symptoms of physical distress.    Intensity THRR unchanged      Progression   Progression Continue to progress workloads to maintain intensity without signs/symptoms of physical distress.    Average METs 4.26      Resistance Training   Training Prescription Yes    Weight 4 lb    Reps 10-15      Interval Training   Interval Training No      Oxygen   Oxygen Continuous    Liters 2      Treadmill   MPH 3    Grade 4    Minutes 15    METs 4.95      Recumbant Bike   Level 12    Watts 54    Minutes 15    METs 3.57      REL-XR   Level 8    Minutes 15      Home Exercise Plan   Plans to continue exercise at Home (comment)   walking, staff videos   Frequency Add 2 additional days to program exercise sessions.    Initial Home Exercises Provided 06/17/21      Oxygen   Maintain Oxygen Saturation 88% or higher             Nutrition:  Target Goals: Understanding of nutrition guidelines, daily intake of sodium  <1543m, cholesterol <2065m calories 30% from fat and 7% or less from saturated fats, daily to have 5 or more servings of fruits and vegetables.  Education: All About Nutrition: -Group instruction provided by verbal, written material, interactive activities, discussions, models, and posters to present general guidelines for heart healthy nutrition including fat, fiber,  MyPlate, the role of sodium in heart healthy nutrition, utilization of the nutrition label, and utilization of this knowledge for meal planning. Follow up email sent as well. Written material given at graduation. Flowsheet Row Pulmonary Rehab from 09/16/2021 in [facility]  Education need identified 06/01/21  Date 08/19/21  Educator [place]  Instruction Review Code 1- Verbalizes Understanding       Biometrics:  Pre Biometrics - 06/01/21 0944       Pre Biometrics   Height 6' (1.829 m)    Weight 238 lb 9.6 oz (108.2 kg)    BMI (Calculated) 32.35    Single Leg Stand 3.93 seconds             Post Biometrics - 09/23/21 0945        Post  Biometrics   Height 6' (1.829 m)    Weight 245 lb 6.4 oz (111.3 kg)    BMI (Calculated) 33.28    Single Leg Stand 4.03 seconds             Nutrition Therapy Plan and Nutrition Goals:  Nutrition Therapy & Goals - 07/20/21 0845       Nutrition Therapy   Diet Heart healthy, low Na, pulmonary friendly    Protein (specify units) 85-90g    Fiber 30 grams    Whole Grain Foods 3 servings    Saturated Fats 12 max. grams    Fruits and Vegetables 8 servings/day    Sodium 1.5 grams      Personal Nutrition Goals   Nutrition Goal ST: bring lunch to work (PB and Jelly on whole wheat, tuna salad with whole wheat crackers, vegetable and chicken on whole wheat) LT: limit saturated fat to <12g/day, limit Na to <2g/day, eat a variety of 8 fruits/vegetables per day, eat out <2 days per week.    Comments B: bacon and eggs 2x/week or cereal (grapenuts, fiber one with whole  milk) L: at work: chicken salad sandwich with chips and fruit D: varies: hamburger, chicken with (brown) rice, quinoa, barley, potatoes with a salad and nonstarchy vegetables. She uses some olive oil with cooking and applesauce with baking. Drinks: coffee (french vanilla creamer), herbal tea, water. No shortness of breath with meals. His sister in-law Levada Dy was enthusiastic and spoke frequently during the consultation; sister in-law is a Engineer, maintenance and was inputting additional education and suggestions during consultation. Reviewed heart healthy eating from nutrition education and pulmonary MNT. Usama would like to first work on his lunch and would like to start bringing lunch to work.      Intervention Plan   Intervention Prescribe, educate and counsel regarding individualized specific dietary modifications aiming towards targeted core components such as weight, hypertension, lipid management, diabetes, heart failure and other comorbidities.    Expected Outcomes Short Term Goal: Understand basic principles of dietary content, such as calories, fat, sodium, cholesterol and nutrients.;Short Term Goal: A plan has been developed with personal nutrition goals set during dietitian appointment.;Long Term Goal: Adherence to prescribed nutrition plan.             Nutrition Assessments:  MEDIFICTS Score Key: ?70 Need to make dietary changes  40-70 Heart Healthy Diet ? 40 Therapeutic Level Cholesterol Diet  Flowsheet Row Pulmonary Rehab from 06/01/2021 in [facility] Cardiac and Pulmonary Rehab  Picture Your Plate Total Score on Admission 49      Picture Your Plate Scores: <78 Unhealthy dietary pattern with much room for improvement. 41-50 Dietary pattern unlikely to  meet recommendations for good health and room for improvement. 51-60 More healthful dietary pattern, with some room for improvement.  >60 Healthy dietary pattern, although there may be some specific behaviors that could be improved.    Nutrition Goals Re-Evaluation:  Nutrition Goals Re-Evaluation     [place] Name 06/17/21 0941 08/17/21 0933 09/09/21 0940         Goals   Nutrition Goal Meet with dietitian ST: bring lunch to work (PB and Jelly on whole wheat, tuna salad with whole wheat crackers, vegetable and chicken on whole wheat) LT: limit saturated fat to <12g/day, limit Na to <2g/day, eat a variety of 8 fruits/vegetables per day, eat out <2 days per week. ST: try some ideas for lunch we brainstormed (besides just sandwiches: spring rolls, leftovers from dinner, wraps, soups/stews, veggies as snacks (he was thinking carrots and peanuts for a snack) LT: bring luch 5x/week instead of going out to eat.     Comment Cade is doing well in rehab.  He needs to meet with dietitian yet.  He is already working on low salt.  We did talk about making sure he gets enough protein and enough fruits and vegetables. Dejour is doing well in rehab.  He is doing okay with his diet. He has not quite tried the whole wheat bread for lunch yet.  He has started to increase his fruits and vegetables.  He has added in prunes for breakfast.  He is has cut back some on eating out. He has tried having tuna with whole wheat bread. He does not always bring lunch to work, he still sometimes goes out for lunch; he now brings his lunch 3 days a week instead of going out to eat. Brainstormed some lunch ideas besides just sandwiches: spring rolls, leftovers from dinner, wraps, soups/stews, veggies as snacks (he was thinking carrots and peanuts for a snack).     Expected Outcome Short: Meet dietitian Long: Add in more protein and balance SHort: Try whole wheat for lunch Long: COnitnue to add in variety. ST: try some ideas for lunch we brainstormed (besides just sandwiches: spring rolls, leftovers from dinner, wraps, soups/stews, veggies as snacks (he was thinking carrots and peanuts for a snack) LT: bring luch 5x/week instead of going out to eat.               Nutrition Goals Discharge (Final Nutrition Goals Re-Evaluation):  Nutrition Goals Re-Evaluation - 09/09/21 0940       Goals   Nutrition Goal ST: try some ideas for lunch we brainstormed (besides just sandwiches: spring rolls, leftovers from dinner, wraps, soups/stews, veggies as snacks (he was thinking carrots and peanuts for a snack) LT: bring luch 5x/week instead of going out to eat.    Comment He has tried having tuna with whole wheat bread. He does not always bring lunch to work, he still sometimes goes out for lunch; he now brings his lunch 3 days a week instead of going out to eat. Brainstormed some lunch ideas besides just sandwiches: spring rolls, leftovers from dinner, wraps, soups/stews, veggies as snacks (he was thinking carrots and peanuts for a snack).    Expected Outcome ST: try some ideas for lunch we brainstormed (besides just sandwiches: spring rolls, leftovers from dinner, wraps, soups/stews, veggies as snacks (he was thinking carrots and peanuts for a snack) LT: bring luch 5x/week instead of going out to eat.             Psychosocial: Target Goals: Acknowledge presence  or absence of significant depression and/or stress, maximize coping skills, provide positive support system. Participant is able to verbalize types and ability to use techniques and skills needed for reducing stress and depression.   Education: Stress, Anxiety, and Depression - Group verbal and visual presentation to define topics covered.  Reviews how body is impacted by stress, anxiety, and depression.  Also discusses healthy ways to reduce stress and to treat/manage anxiety and depression.  Written material given at graduation. Flowsheet Row Pulmonary Rehab from 09/16/2021 in [facility] Cardiac and Pulmonary Rehab  Date 09/16/21  Educator [facility]  Instruction Review Code 1- Verbalizes Understanding       Education: Sleep Hygiene -Provides group verbal and written instruction about how sleep can affect your  health.  Define sleep hygiene, discuss sleep cycles and impact of sleep habits. Review good sleep hygiene tips.    Initial Review & Psychosocial Screening:  Initial Psych Review & Screening - 05/21/21 1509       Initial Review   Current issues with Current Sleep Concerns      Family Dynamics   Good Support System? Yes      Barriers   Psychosocial barriers to participate in program There are no identifiable barriers or psychosocial needs.;The patient should benefit from training in stress management and relaxation.      Screening Interventions   Interventions Encouraged to exercise;Provide feedback about the scores to participant;To provide support and resources with identified psychosocial needs    Expected Outcomes Short Term goal: Utilizing psychosocial counselor, staff and physician to assist with identification of specific Stressors or current issues interfering with healing process. Setting desired goal for each stressor or current issue identified.;Long Term Goal: Stressors or current issues are controlled or eliminated.;Short Term goal: Identification and review with participant of any Quality of Life or Depression concerns found by scoring the questionnaire.;Long Term goal: The participant improves quality of Life and PHQ9 Scores as seen by post scores and/or verbalization of changes             Quality of Life Scores:  Scores of 19 and below usually indicate a poorer quality of life in these areas.  A difference of  2-3 points is a clinically meaningful difference.  A difference of 2-3 points in the total score of the Quality of Life Index has been associated with significant improvement in overall quality of life, self-image, physical symptoms, and general health in studies assessing change in quality of life.  PHQ-9: Recent Review Flowsheet Data     Depression screen [facility] 2/9 06/29/2021 06/01/2021   Decreased Interest 1 0   Down, Depressed, Hopeless 0 1   PHQ - 2 Score 1 1    Altered sleeping 1 2   Tired, decreased energy 1 1   Change in appetite 0 1   Feeling bad or failure about yourself  0 1   Trouble concentrating 0 1   Moving slowly or fidgety/restless 0 0   Suicidal thoughts 0 0   PHQ-9 Score 3 7   Difficult doing work/chores Somewhat difficult Somewhat difficult      Interpretation of Total Score  Total Score Depression Severity:  1-4 = Minimal depression, 5-9 = Mild depression, 10-14 = Moderate depression, 15-19 = Moderately severe depression, 20-27 = Severe depression   Psychosocial Evaluation and Intervention:  Psychosocial Evaluation - 05/21/21 1526       Psychosocial Evaluation & Interventions   Interventions Encouraged to exercise with the program and follow exercise prescription  Comments Mr. Conant breathing has been a concern for him for a while. Thankfully he has been able to wean himself off his oxygen during the day. Currently, he requires 2L at night but is getting a sleep study soon to determine if he needs CPAP. His sister in law is involved in his care. He plans on going back to work Sept 19 so he isn't sure what his schedule will look like.    Expected Outcomes Short: attend pulmonary rehab for education and exercise. Long: develop and maintain positive self care habits.    Continue Psychosocial Services  Follow up required by staff             Psychosocial Re-Evaluation:  Psychosocial Re-Evaluation     [place] Name 06/17/21 [phone] 06/29/21 0925 07/22/21 0951 08/17/21 0932 09/09/21 0951     Psychosocial Re-Evaluation   Current issues with Current Sleep Concerns Current Stress Concerns;Current Depression Current Stress Concerns;Current Depression Current Stress Concerns;Current Depression Current Stress Concerns;Current Depression   Comments Cristofher is doing well in rehab.  He is generally a positive guy and tries not to let anything stress him out too much.  He is a light sleeper and has always been.  He does have a hard time  getting back to sleep.  He has had a sleep study in the past and is going to get a CPAP ordered. Roald is planning to return to work this weekend and will keep an eye on his oxygen levels. Reviewed patient health questionnaire (PHQ-9) with patient for follow up. Previously, patients score indicated signs/symptoms of depression.  Reviewed to see if patient is improving symptom wise while in program.  Score improved and patient states that it is because they have been able to get out more and feeling better overall. Square is doing well in rehab.  He denies any major stressors currenty. He has not had as much depression symptoms recently, a few days here and there.  He is still sleeping well and enjoying coming to class. Jiovani doing well in rehab. Overall, he is doing well mentally.  He had some mild depression sneak in on him recently.  He is sleeping well most days, but still having some bad nights too. He reports doing well and trying to take issues on as they come up. He feels his mild depression is still here, but feels it is seasonal. He enjoys listening to music to reduce stress. He relies on his sister in-law for support. He continues to sleep well most days, sometimes he is interupted by the bathroom or trouble turning his brain off for the night (thinking about the day).   Expected Outcomes Short: Get CPAP to help with sleep Long: Continue to focus on positive. Short: Continue to attend [facility] regularly for regular exercise and social engagement. Long: Continue to improve symptoms and manage a positive mental state. Short: Continue to exercise for mental boost Long: Continue to stay positve Short: Continue to exercise for mental boost Long: Continue to stay positve Short: Continue to exercise for mental boost  and listen to music Long: Continue to stay positve   Interventions Encouraged to attend Pulmonary Rehabilitation for the exercise Encouraged to attend Pulmonary Rehabilitation for the exercise  Encouraged to attend Pulmonary Rehabilitation for the exercise Encouraged to attend Pulmonary Rehabilitation for the exercise Encouraged to attend Pulmonary Rehabilitation for the exercise   Continue Psychosocial Services  Follow up required by staff -- -- -- Follow up required by staff  Psychosocial Discharge (Final Psychosocial Re-Evaluation):  Psychosocial Re-Evaluation - 09/09/21 0951       Psychosocial Re-Evaluation   Current issues with Current Stress Concerns;Current Depression    Comments He reports doing well and trying to take issues on as they come up. He feels his mild depression is still here, but feels it is seasonal. He enjoys listening to music to reduce stress. He relies on his sister in-law for support. He continues to sleep well most days, sometimes he is interupted by the bathroom or trouble turning his brain off for the night (thinking about the day).    Expected Outcomes Short: Continue to exercise for mental boost  and listen to music Long: Continue to stay positve    Interventions Encouraged to attend Pulmonary Rehabilitation for the exercise    Continue Psychosocial Services  Follow up required by staff             Education: Education Goals: Education classes will be provided on a weekly basis, covering required topics. Participant will state understanding/return demonstration of topics presented.  Learning Barriers/Preferences:   General Pulmonary Education Topics:  Infection Prevention: - Provides verbal and written material to individual with discussion of infection control including proper hand washing and proper equipment cleaning during exercise session. Flowsheet Row Pulmonary Rehab from 09/16/2021 in [facility] Cardiac and Pulmonary Rehab  Education need identified 06/01/21  Date 06/01/21  Educator [place]  Instruction Review Code 1- Verbalizes Understanding       Falls Prevention: - Provides verbal and written material to individual with  discussion of falls prevention and safety. Flowsheet Row Pulmonary Rehab from 09/16/2021 in [facility] Cardiac and Pulmonary Rehab  Education need identified 06/01/21  Date 06/01/21  Educator [place]  Instruction Review Code 1- Verbalizes Understanding       Chronic Lung Disease Review: - Group verbal instruction with posters, models, PowerPoint presentations and videos,  to review new updates, new respiratory medications, new advancements in procedures and treatments. Providing information on websites and "800" numbers for continued self-education. Includes information about supplement oxygen, available portable oxygen systems, continuous and intermittent flow rates, oxygen safety, concentrators, and Medicare reimbursement for oxygen. Explanation of Pulmonary Drugs, including class, frequency, complications, importance of spacers, rinsing mouth after steroid MDI's, and proper cleaning methods for nebulizers. Review of basic lung anatomy and physiology related to function, structure, and complications of lung disease. Review of risk factors. Discussion about methods for diagnosing sleep apnea and types of masks and machines for OSA. Includes a review of the use of types of environmental controls: home humidity, furnaces, filters, dust mite/pet prevention, HEPA vacuums. Discussion about weather changes, air quality and the benefits of nasal washing. Instruction on Warning signs, infection symptoms, calling MD promptly, preventive modes, and value of vaccinations. Review of effective airway clearance, coughing and/or vibration techniques. Emphasizing that all should Create an Action Plan. Written material given at graduation. Flowsheet Row Pulmonary Rehab from 09/16/2021 in [facility] Cardiac and Pulmonary Rehab  Education need identified 06/01/21  Date 07/08/21  Educator [facility]  Instruction Review Code 1- Verbalizes Understanding       AED/CPR: - Group verbal and written instruction with the use of models to  demonstrate the basic use of the AED with the basic ABC's of resuscitation.    Anatomy and Cardiac Procedures: - Group verbal and visual presentation and models provide information about basic cardiac anatomy and function. Reviews the testing methods done to diagnose heart disease and the outcomes of the test results. Describes the  treatment choices: Medical Management, Angioplasty, or Coronary Bypass Surgery for treating various heart conditions including Myocardial Infarction, Angina, Valve Disease, and Cardiac Arrhythmias.  Written material given at graduation. Flowsheet Row Pulmonary Rehab from 09/16/2021 in [facility] Cardiac and Pulmonary Rehab  Date 08/05/21  Educator sb  Instruction Review Code 1- Verbalizes Understanding       Medication Safety: - Group verbal and visual instruction to review commonly prescribed medications for heart and lung disease. Reviews the medication, class of the drug, and side effects. Includes the steps to properly store meds and maintain the prescription regimen.  Written material given at graduation. Flowsheet Row Pulmonary Rehab from 09/16/2021 in [facility] Cardiac and Pulmonary Rehab  Date 06/17/21  Educator KB  Instruction Review Code 1- Verbalizes Understanding       Other: -Provides group and verbal instruction on various topics (see comments)   Knowledge Questionnaire Score:  Knowledge Questionnaire Score - 06/01/21 0955       Knowledge Questionnaire Score   Pre Score 13/18: ADL, O2, PLB              Core Components/Risk Factors/Patient Goals at Admission:  Personal Goals and Risk Factors at Admission - 06/01/21 1056       Core Components/Risk Factors/Patient Goals on Admission    Weight Management Yes;Weight Loss    Intervention Weight Management: Develop a combined nutrition and exercise program designed to reach desired caloric intake, while maintaining appropriate intake of nutrient and fiber, sodium and fats, and appropriate energy  expenditure required for the weight goal.;Weight Management: Provide education and appropriate resources to help participant work on and attain dietary goals.;Weight Management/Obesity: Establish reasonable short term and long term weight goals.    Admit Weight 238 lb (108 kg)    Goal Weight: Short Term 233 lb (105.7 kg)    Goal Weight: Long Term 220 lb (99.8 kg)    Expected Outcomes Short Term: Continue to assess and modify interventions until short term weight is achieved;Long Term: Adherence to nutrition and physical activity/exercise program aimed toward attainment of established weight goal;Weight Loss: Understanding of general recommendations for a balanced deficit meal plan, which promotes 1-2 lb weight loss per week and includes a negative energy balance of [id number] kcal/d;Understanding recommendations for meals to include 15-35% energy as protein, 25-35% energy from fat, 35-60% energy from carbohydrates, less than 22m of dietary cholesterol, 20-35 gm of total fiber daily;Understanding of distribution of calorie intake throughout the day with the consumption of 4-5 meals/snacks    Improve shortness of breath with ADL's Yes    Intervention Provide education, individualized exercise plan and daily activity instruction to help decrease symptoms of SOB with activities of daily living.    Expected Outcomes Short Term: Improve cardiorespiratory fitness to achieve a reduction of symptoms when performing ADLs;Long Term: Be able to perform more ADLs without symptoms or delay the onset of symptoms    Hypertension Yes    Intervention Monitor prescription use compliance.;Provide education on lifestyle modifcations including regular physical activity/exercise, weight management, moderate sodium restriction and increased consumption of fresh fruit, vegetables, and low fat dairy, alcohol moderation, and smoking cessation.    Expected Outcomes Short Term: Continued assessment and intervention until BP is <  140/927mHG in hypertensive participants. < 130/8058mG in hypertensive participants with diabetes, heart failure or chronic kidney disease.;Long Term: Maintenance of blood pressure at goal levels.    Lipids Yes    Intervention Provide education and support for participant on nutrition & aerobic/resistive exercise  along with prescribed medications to achieve LDL <23m, HDL >466m    Expected Outcomes Short Term: Participant states understanding of desired cholesterol values and is compliant with medications prescribed. Participant is following exercise prescription and nutrition guidelines.;Long Term: Cholesterol controlled with medications as prescribed, with individualized exercise RX and with personalized nutrition plan. Value goals: LDL < 7085mHDL > 40 mg.             Education:Diabetes - Individual verbal and written instruction to review signs/symptoms of diabetes, desired ranges of glucose level fasting, after meals and with exercise. Acknowledge that pre and post exercise glucose checks will be done for 3 sessions at entry of program.   Know Your Numbers and Heart Failure: - Group verbal and visual instruction to discuss disease risk factors for cardiac and pulmonary disease and treatment options.  Reviews associated critical values for Overweight/Obesity, Hypertension, Cholesterol, and Diabetes.  Discusses basics of heart failure: signs/symptoms and treatments.  Introduces Heart Failure Zone chart for action plan for heart failure.  Written material given at graduation. Flowsheet [facility]  Date 07/01/21  Educator KB  Instruction Review Code 1- Verbalizes Understanding       Core Components/Risk Factors/Patient Goals Review:   Goals and Risk Factor Review     Row Name 06/17/21 0946 07/22/21 0953 08/17/21 0934 09/09/21 0937       Core Components/Risk Factors/Patient Goals Review   Personal Goals Review Weight  Management/Obesity;Increase knowledge of respiratory medications and ability to use respiratory devices properly.;Improve shortness of breath with ADL's;Hypertension;Lipids Weight Management/Obesity;Increase knowledge of respiratory medications and ability to use respiratory devices properly.;Improve shortness of breath with ADL's;Hypertension;Lipids Weight Management/Obesity;Increase knowledge of respiratory medications and ability to use respiratory devices properly.;Improve shortness of breath with ADL's;Hypertension;Lipids Weight Management/Obesity;Improve shortness of breath with ADL's;Hypertension    Review PauAnnette doing well in rehab.  His weight is still going up and down depending on what he is eating.  His blood pressures have reading higher at home than here by about 30 pts.  His pressures tend to run slightly elevated anyways.  We talked about bringing in his blood pressure cuff. His breathing is improving and he is able to do more.  He is using his inhaler and doing well. He does not have a spacer yet and we will get him one once back in stock. PauHildreth doing well in rehab.  His weight is trending up again and he admits to needing to buckle down on his diet.  He just met with dietian this week, so he is hoping that will help some.  He is doing well  with inhalers and meds.  His breathing is doing better.  Pressures have continued to do well. He did bring in his cuff and decided he needed a new one. PauOdysseus doing well in rehab.  His weight is holding steady and he is still trying to get it down more.  His breathing is getting better and doing well with his inhalers.  Blood pressures still doing well in class and he did get a new cuff for home. PauJule doing well in rehab, his breathing continues to improves and he continues to practice PLB. He continues to use CPAP with no issues. PauArkeemports when he saw his doctor yesterday, MD reported PauLamorriss doing well. He continues to track his O2 at home. His  weight continues to be stable, he weighs himself every other day. He  continues to use his inhalers and reports no issues at this time. He continues to check his BP at home 1x/day in the am. He takes all of his medications as prescribed with no issues.    Expected Outcomes Short: Get spacer for inhaler and bring in cuff  Long: Continue to work on weight loss SHort: Continue to use inhaler when needed Long: Continue to work on weight loss SHort: Continue to use inhaler when needed Long: Continue to work on weight loss SHort: Continue to use inhaler when needed Long: Continue to work on [employer] loss             Core Components/Risk Factors/Patient Goals at Discharge (Final Review):   Goals and Risk Factor Review - 09/09/21 0937       Core Components/Risk Factors/Patient Goals Review   Personal Goals Review Weight Management/Obesity;Improve shortness of breath with ADL's;Hypertension    Review Jahshua is doing well in rehab, his breathing continues to improves and he continues to practice PLB. He continues to use CPAP with no issues. Tylan reports when he saw his doctor yesterday, MD reported Shamari was doing well. He continues to track his O2 at home. His weight continues to be stable, he weighs himself every other day. He continues to use his inhalers and reports no issues at this time. He continues to check his BP at home 1x/day in the am. He takes all of his medications as prescribed with no issues.    Expected Outcomes SHort: Continue to use inhaler when needed Long: Continue to work on weight loss             ITP Comments:  ITP Comments     Row Name 05/21/21 1520 06/01/21 0940 06/08/21 0932 06/09/21 0607 07/07/21 0819   ITP Comments Initial telephone orientation completed. Diagnosis can be found in [facility] 7/25. EP orientation scheduled for Tuesday 8/30 at 8 am. Completed 6MWT and gym orientation. Initial ITP created and sent for review to Dr. Ottie Glazier, Medical Director. First full day of  exercise!  Patient was oriented to gym and equipment including functions, settings, policies, and procedures.  Patient's individual exercise prescription and treatment plan were reviewed.  All starting workloads were established based on the results of the 6 minute walk test done at initial orientation visit.  The plan for exercise progression was also introduced and progression will be customized based on patient's performance and goals. 30 Day review completed. Medical Director ITP review done, changes made as directed, and signed approval by Medical Director.   New 30 day review completed. ITP sent to Dr. Zetta Bills, Medical Director of Pulmonary Rehab. Continue with ITP unless changes are made by physician.    [place] Name 07/20/21 [phone] 08/04/21 0644 09/01/21 0639 09/29/21 0700     ITP Comments Completed initial RD consultation 30 Day review completed. Medical Director ITP review done, changes made as directed, and signed approval by Medical Director. 30 Day review completed. Medical Director ITP review done, changes made as directed, and signed approval by Medical Director. 30 Day review completed. Medical Director ITP review done, changes made as directed, and signed approval by Medical Director.             Comments:

## 2021-09-30 ENCOUNTER — Other Ambulatory Visit: Payer: Self-pay

## 2021-09-30 DIAGNOSIS — J9601 Acute respiratory failure with hypoxia: Secondary | ICD-10-CM | POA: Diagnosis not present

## 2021-09-30 NOTE — Progress Notes (Signed)
Daily Session Note  Patient Details  Name: Danny Norris MRN: [id number] Date of Birth: 09-09-1961 Referring Provider:   Flowsheet Row Pulmonary Rehab from 06/01/2021 in [facility] Cardiac and Pulmonary Rehab  Referring Provider [name], MD       Encounter Date: 09/30/2021  Check In:  Session Check In - 09/30/21 1035       Check-In   Supervising physician immediately available to respond to emergencies See telemetry face sheet for immediately available ER MD    Location [facility]-Cardiac & Pulmonary Rehab    Staff Present Will Bonnet, RN,BC,MSN;Melissa [place], RDN, Rowe Pavy, BA, ACSM CEP, Exercise Physiologist    Virtual Visit No    Medication changes reported     No    Fall or balance concerns reported    No    Warm-up and Cool-down Performed on first and last piece of equipment    Resistance Training Performed Yes    VAD Patient? No    PAD/SET Patient? No      Pain Assessment   Currently in Pain? No/denies                Social History   Tobacco Use  Smoking Status Never  Smokeless Tobacco Never    Goals Met:  Independence with exercise equipment Exercise tolerated well No report of concerns or symptoms today Strength training completed today  Goals Unmet:  Not Applicable  Comments: Pt able to follow exercise prescription today without complaint.  Will continue to monitor for progression.    Dr. Emily Filbert is Medical Director for [place].  Dr. Ottie Glazier is Medical Director for [facility] Pulmonary Rehabilitation.

## 2021-10-05 ENCOUNTER — Other Ambulatory Visit: Payer: Self-pay

## 2021-10-05 ENCOUNTER — Encounter: Payer: BC Managed Care – PPO | Attending: Pulmonary Disease

## 2021-10-05 DIAGNOSIS — J9601 Acute respiratory failure with hypoxia: Secondary | ICD-10-CM | POA: Insufficient documentation

## 2021-10-05 NOTE — Progress Notes (Signed)
Daily Session Note  Patient Details  Name: Danny Norris MRN: [id number] Date of Birth: 10/18/1960 Referring Provider:   Flowsheet Row Pulmonary Rehab from 06/01/2021 in [facility] Cardiac and Pulmonary Rehab  Referring Provider [name], MD       Encounter Date: 10/05/2021  Check In:  Session Check In - 10/05/21 0925       Check-In   Supervising physician immediately available to respond to emergencies See telemetry face sheet for immediately available ER MD    Location [facility]-Cardiac & Pulmonary Rehab    Staff Present Birdie Sons, MPA, RN;Amanda Oletta Darter, BA, ACSM CEP, Exercise Physiologist;Jessica Luan Pulling, MA, RCEP, CCRP, CCET    Virtual Visit No    Medication changes reported     No    Fall or balance concerns reported    No    Tobacco Cessation No Change    Warm-up and Cool-down Performed on first and last piece of equipment    Resistance Training Performed Yes    VAD Patient? No    PAD/SET Patient? No      Pain Assessment   Currently in Pain? No/denies                Social History   Tobacco Use  Smoking Status Never  Smokeless Tobacco Never    Goals Met:  Independence with exercise equipment Exercise tolerated well No report of concerns or symptoms today Strength training completed today  Goals Unmet:  Not Applicable  Comments: Pt able to follow exercise prescription today without complaint.  Will continue to monitor for progression.    Dr. Emily Filbert is Medical Director for [place].  Dr. Ottie Glazier is Medical Director for [facility] Pulmonary Rehabilitation.

## 2021-10-07 ENCOUNTER — Other Ambulatory Visit: Payer: Self-pay

## 2021-10-07 DIAGNOSIS — J9601 Acute respiratory failure with hypoxia: Secondary | ICD-10-CM

## 2021-10-07 NOTE — Progress Notes (Signed)
Daily Session Note  Patient Details  Name: Danny Norris MRN: [id number] Date of Birth: Feb 10, 1961 Referring Provider:   Flowsheet Row Pulmonary Rehab from 06/01/2021 in [facility] Cardiac and Pulmonary Rehab  Referring Provider [name], MD       Encounter Date: 10/07/2021  Check In:  Session Check In - 10/07/21 0914       Check-In   Supervising physician immediately available to respond to emergencies See telemetry face sheet for immediately available ER MD    Location [facility]-Cardiac & Pulmonary Rehab    Staff Present Birdie Sons, MPA, Mauricia Area, BS, ACSM CEP, Exercise Physiologist;Amanda Oletta Darter, BA, ACSM CEP, Exercise Physiologist    Virtual Visit No    Medication changes reported     No    Fall or balance concerns reported    No    Tobacco Cessation No Change    Warm-up and Cool-down Performed on first and last piece of equipment    Resistance Training Performed Yes    VAD Patient? No    PAD/SET Patient? No      Pain Assessment   Currently in Pain? No/denies                Social History   Tobacco Use  Smoking Status Never  Smokeless Tobacco Never    Goals Met:  Independence with exercise equipment Exercise tolerated well No report of concerns or symptoms today Strength training completed today  Goals Unmet:  Not Applicable  Comments: Pt able to follow exercise prescription today without complaint.  Will continue to monitor for progression.    Dr. Emily Filbert is Medical Director for [place].  Dr. Ottie Glazier is Medical Director for [facility] Pulmonary Rehabilitation.

## 2021-10-07 NOTE — Patient Instructions (Signed)
Discharge Patient Instructions  Patient Details  Name: Danny Norris MRN: [id number] Date of Birth: 05/26/1961 Referring Provider:  Freddi Starr, MD   Number of Visits: 44  Reason for Discharge:  Patient reached a stable level of exercise. Patient independent in their exercise. Patient has met program and personal goals.  Smoking History:  Social History   Tobacco Use  Smoking Status Never  Smokeless Tobacco Never    Diagnosis:  Acute respiratory failure with hypoxia ([place])  Initial Exercise Prescription:  Initial Exercise Prescription - 06/01/21 1000       Date of Initial Exercise RX and Referring Provider   Date 06/01/21    Referring Provider [name], MD      Oxygen   Oxygen Continuous    Liters 2    Maintain Oxygen Saturation 88% or higher      Treadmill   MPH 2.3    Grade 0.5    Minutes 15    METs 2.92      Recumbant Bike   Level 2    RPM 60    Watts 30    Minutes 15    METs 3.7      REL-XR   Level 2    Speed 50    Minutes 15    METs 3.7      Prescription Details   Frequency (times per week) 2    Duration Progress to 30 minutes of continuous aerobic without signs/symptoms of physical distress      Intensity   THRR 40-80% of Max Heartrate 101-141    Ratings of Perceived Exertion 11-13    Perceived Dyspnea 0-4      Progression   Progression Continue to progress workloads to maintain intensity without signs/symptoms of physical distress.      Resistance Training   Training Prescription Yes    Weight 4 lb    Reps 10-15             Discharge Exercise Prescription (Final Exercise Prescription Changes):  Exercise Prescription Changes - 09/29/21 1300       Response to Exercise   Blood Pressure (Admit) 120/64    Blood Pressure (Exit) 118/66    Heart Rate (Admit) 67 bpm    Heart Rate (Exercise) 105 bpm    Heart Rate (Exit) 70 bpm    Oxygen Saturation (Admit) 94 %    Oxygen Saturation (Exercise) 92 %    Oxygen  Saturation (Exit) 95 %    Rating of Perceived Exertion (Exercise) 11    Perceived Dyspnea (Exercise) 2    Symptoms SOB    Duration Continue with 30 min of aerobic exercise without signs/symptoms of physical distress.    Intensity THRR unchanged      Progression   Progression Continue to progress workloads to maintain intensity without signs/symptoms of physical distress.    Average METs 3.7      Resistance Training   Training Prescription Yes    Weight 4 lb    Reps 10-15      Interval Training   Interval Training No      Oxygen   Oxygen Continuous    Liters 2      Treadmill   MPH 2.8    Grade 4    Minutes 15    METs 4.7      Recumbant Bike   Level 10    Minutes 15    METs 2.7      Home Exercise Plan  Plans to continue exercise at Home (comment)   walking, staff videos   Frequency Add 2 additional days to program exercise sessions.    Initial Home Exercises Provided 06/17/21      Oxygen   Maintain Oxygen Saturation 88% or higher             Functional Capacity:  6 Minute Walk     Row Name 06/01/21 0947 09/23/21 0940       6 Minute Walk   Phase Initial Discharge    Distance 1385 feet 1350 feet    Distance % Change -- -3 %    Distance Feet Change -- -35 ft    Walk Time 6 minutes 6 minutes    # of Rest Breaks 0 0    MPH 2.62 2.56    METS 3.73 4    RPE 13 11    Perceived Dyspnea  2 1    VO2 Peak 13.07 14.01    Symptoms Yes (comment) No    Comments SOB --    Resting HR 62 bpm 104 bpm    Resting BP 140/82 138/78    Resting Oxygen Saturation  96 % 97 %    Exercise Oxygen Saturation  during 6 min walk 91 % 91 %    Max Ex. HR 106 bpm 127 bpm    Max Ex. BP 166/84 180/88    2 Minute Post BP 144/84 148/82      Interval HR   1 Minute HR 79 110    2 Minute HR 96 106    3 Minute HR 101 116    4 Minute HR 103 127    5 Minute HR 106 126    6 Minute HR 105 127    2 Minute Post HR 67 111    Interval Heart Rate? Yes Yes      Interval Oxygen    Interval Oxygen? Yes Yes    Baseline Oxygen Saturation % 96 % 97 %    1 Minute Oxygen Saturation % 93 % 93 %    1 Minute Liters of Oxygen 2 L 2 L    2 Minute Oxygen Saturation % 92 % 91 %    2 Minute Liters of Oxygen 2 L 2 L    3 Minute Oxygen Saturation % 93 % 92 %    3 Minute Liters of Oxygen 2 L 2 L    4 Minute Oxygen Saturation % 92 % 92 %    4 Minute Liters of Oxygen 2 L 2 L    5 Minute Oxygen Saturation % 91 % 92 %    5 Minute Liters of Oxygen 2 L 2 L    6 Minute Oxygen Saturation % 92 % 92 %    6 Minute Liters of Oxygen 2 L 2 L    2 Minute Post Oxygen Saturation % 94 % 94 %    2 Minute Post Liters of Oxygen 2 L 2 L              Nutrition & Weight - Outcomes:  Pre Biometrics - 06/01/21 0944       Pre Biometrics   Height 6' (1.829 m)    Weight 238 lb 9.6 oz (108.2 kg)    BMI (Calculated) 32.35    Single Leg Stand 3.93 seconds             Post Biometrics - 09/23/21 0945        Post  Biometrics   Height 6' (1.829 m)    Weight 245 lb 6.4 oz (111.3 kg)    BMI (Calculated) 33.28    Single Leg Stand 4.03 seconds             Nutrition:  Nutrition Therapy & Goals - 07/20/21 0845       Nutrition Therapy   Diet Heart healthy, low Na, pulmonary friendly    Protein (specify units) 85-90g    Fiber 30 grams    Whole Grain Foods 3 servings    Saturated Fats 12 max. grams    Fruits and Vegetables 8 servings/day    Sodium 1.5 grams      Personal Nutrition Goals   Nutrition Goal ST: bring lunch to work (PB and Jelly on whole wheat, tuna salad with whole wheat crackers, vegetable and chicken on whole wheat) LT: limit saturated fat to <12g/day, limit Na to <2g/day, eat a variety of 8 fruits/vegetables per day, eat out <2 days per week.    Comments B: bacon and eggs 2x/week or cereal (grapenuts, fiber one with whole milk) L: at work: chicken salad sandwich with chips and fruit D: varies: hamburger, chicken with (brown) rice, quinoa, barley, potatoes with a salad  and nonstarchy vegetables. She uses some olive oil with cooking and applesauce with baking. Drinks: coffee (french vanilla creamer), herbal tea, water. No shortness of breath with meals. His sister in-law Levada Dy was enthusiastic and spoke frequently during the consultation; sister in-law is a Engineer, maintenance and was inputting additional education and suggestions during consultation. Reviewed heart healthy eating from nutrition education and pulmonary MNT. [place] would like to first work on his lunch and would like to start bringing lunch to work.      Intervention Plan   Intervention Prescribe, educate and counsel regarding individualized specific dietary modifications aiming towards targeted core components such as weight, hypertension, lipid management, diabetes, heart failure and other comorbidities.    Expected Outcomes Short Term Goal: Understand basic principles of dietary content, such as calories, fat, sodium, cholesterol and nutrients.;Short Term Goal: A plan has been developed with personal nutrition goals set during dietitian appointment.;Long Term Goal: Adherence to prescribed nutrition plan.              Goals reviewed with patient; copy given to patient.

## 2021-10-11 ENCOUNTER — Other Ambulatory Visit: Payer: Self-pay

## 2021-10-11 ENCOUNTER — Ambulatory Visit (INDEPENDENT_AMBULATORY_CARE_PROVIDER_SITE_OTHER): Payer: BC Managed Care – PPO | Admitting: Pulmonary Disease

## 2021-10-11 ENCOUNTER — Encounter: Payer: Self-pay | Admitting: Pulmonary Disease

## 2021-10-11 VITALS — BP 134/82 | HR 52 | Ht 72.0 in | Wt 250.0 lb

## 2021-10-11 DIAGNOSIS — J986 Disorders of diaphragm: Secondary | ICD-10-CM

## 2021-10-11 DIAGNOSIS — J9600 Acute respiratory failure, unspecified whether with hypoxia or hypercapnia: Secondary | ICD-10-CM

## 2021-10-11 DIAGNOSIS — I824Y9 Acute embolism and thrombosis of unspecified deep veins of unspecified proximal lower extremity: Secondary | ICD-10-CM | POA: Diagnosis not present

## 2021-10-11 NOTE — Patient Instructions (Addendum)
Recommend having the Pulmonary Rehab team check your 6 minute walk test on room air to determine if you require supplemental oxygen with exertion.   Once we have results from the rehab visit we will order you a portable oxygen concentrator if needed.  Follow up in 3 months with pulmonary function tests

## 2021-10-11 NOTE — Progress Notes (Signed)
Synopsis: Referred in January 2023 for respiratory failure  Subjective:   PATIENT ID: Danny Norris GENDER: male DOB: [birth year], MRN: [id number]   HPI  Chief Complaint  Patient presents with   Follow-up    States he is only using O2 at pulmonary rehab. Uses his CPAP at night. States he has been doing well since last visit.    Danny Norris is a 61 year old male, never smoker with history of hypertension, mitral valve regurgitation due to MV prolapse, pulmonary nodule, elevated right hemidiaphragm and DVT who returns to pulmonary clinic for chronic hypoxemic respiratory failure.   He will be completing pulmonary rehab tomorrow and reports he continues to use supplemental oxygen 2L with exercise while at rehab. Otherwise he is not using supplemental oxygen. He is using CPAP therapy at night.   NM PET Scan for further workup of the left lower lobe lobe subpleural nodule noted on 03/08/21 CT Chest scan had resolved. No other concerning findings on the scan that was suspicious of hypermetabolic activity.   He remains on eliquis for DVT treatment.  He has been doing well since last visits with Kandice RobinsonsSarah Groce, NP and Dr. Tonia BroomsIcard.  OV 03/17/21 with Dr. Tonia BroomsIcard This is a 61 year old gentleman, history of dyslipidemia, hypertension, mitral valve regurgitation with a recent hospitalization.Patient was admitted to the hospital by critical care services.  Found to have acute hypoxemic respiratory failure, viral prodrome type symptoms, pulmonary opacities negative for COVID.  Was treated for community-acquired pneumonia RVP was negative.  Unable to get CTA due to AKI.  Patient had bilateral lower extremity duplex completed on 02/20/2021 which revealed acute DVTs in the right and left peroneal veins and left posterior tibial deep veins.  Patient was started on anticoagulation and discharged on Eliquis.  Discharge summary from Dr. Dartha Lodgegbata was reviewed.  Also during hospitalization had noticed a right elevated  hemidiaphragm felt to be chronic.  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  Past Medical History:  Diagnosis Date   Dyslipidemia    HTN (hypertension)    Mitral valve regurgitation    a. 05/2011: TEE showing significant MVP especially involving the P2 segment without evidence for ruptured chordae and had eccentric MR anteriorly directed. b. 03/2015: echo showing EF 65-70% with posterior MV leaflet thickening and prolapse with mild MR.   Normal coronary arteries    a. normal cors by cath in 08/2011     Family History  Problem Relation Age of Onset   Sleep apnea Brother    Congestive Heart Failure Mother      Social History   Socioeconomic History   Marital status: Single    Spouse name: Not on file   Number of children: Not on file   Years of education: Not on file   Highest education level: Not on file  Occupational History   Not on file  Tobacco Use   Smoking status: Never   Smokeless tobacco: Never  Substance and Sexual Activity   Alcohol use: No    Alcohol/week: 0.0 standard drinks   Drug use: Not on file   Sexual activity: Not on file  Other Topics Concern   Not on file  Social History Narrative   Not on file   Social Determinants of Health   Financial Resource Strain: Not on file  Food Insecurity: Not on file  Transportation Needs: Not on file  Physical Activity: Not on file  Stress: Not on file  Social Connections:  Not on file  Intimate Partner Violence: Not on file     Allergies  Allergen Reactions   Contrast Media [Iodinated Contrast Media] Rash     Outpatient Medications Prior to Visit  Medication Sig Dispense Refill   amLODipine (NORVASC) 5 MG tablet Take 5 mg by mouth daily.     apixaban (ELIQUIS) 5 MG TABS tablet Apixaban 10 mg po bid x 3 days, than 5 mg po bid 66 tablet 1   atorvastatin (LIPITOR) 20 MG tablet Take 1 tablet (20 mg total) by mouth daily at 6 PM. 30 tablet 6   docusate sodium  (COLACE) 100 MG capsule Take 1 capsule (100 mg total) by mouth 2 (two) times daily. 10 capsule 0   hydrochlorothiazide (HYDRODIURIL) 25 MG tablet TAKE 1 TABLET BY MOUTH EVERY DAY (Patient taking differently: Take 25 mg by mouth daily.) 90 tablet 1   metoprolol succinate (TOPROL-XL) 50 MG 24 hr tablet TAKE 1 TABLET BY MOUTH DAILY WITH OR IMMEDIATELY FOLLOWING A MEAL (Patient taking differently: Take 50 mg by mouth daily at 6 (six) AM.) 90 tablet 1   feeding supplement (ENSURE ENLIVE / ENSURE PLUS) LIQD Take 237 mLs by mouth 2 (two) times daily between meals. 237 mL 12   polyethylene glycol (MIRALAX / GLYCOLAX) 17 g packet Take 17 g by mouth daily. 14 each 0   No facility-administered medications prior to visit.    Review of Systems  Constitutional:  Negative for chills, fever, malaise/fatigue and weight loss.  HENT:  Negative for congestion, sinus pain and sore throat.   Eyes: Negative.   Respiratory:  Negative for cough, hemoptysis, sputum production, shortness of breath and wheezing.   Cardiovascular:  Negative for chest pain, palpitations, orthopnea, claudication and leg swelling.  Gastrointestinal:  Negative for abdominal pain, heartburn, nausea and vomiting.  Genitourinary: Negative.   Musculoskeletal:  Negative for joint pain and myalgias.  Skin:  Negative for rash.  Neurological:  Negative for weakness.  Endo/Heme/Allergies: Negative.   Psychiatric/Behavioral: Negative.       Objective:   Vitals:   10/11/21 1121  BP: 134/82  Pulse: (!) 52  SpO2: 96%  Weight: 250 lb (113.4 kg)  Height: 6' (1.829 m)   Physical Exam Constitutional:      General: He is not in acute distress. HENT:     Head: Normocephalic and atraumatic.  Eyes:     Extraocular Movements: Extraocular movements intact.     Conjunctiva/sclera: Conjunctivae normal.     Pupils: Pupils are equal, round, and reactive to light.  Cardiovascular:     Rate and Rhythm: Normal rate and regular rhythm.     Pulses:  Normal pulses.     Heart sounds: Normal heart sounds. No murmur heard. Pulmonary:     Effort: Pulmonary effort is normal.     Breath sounds: Normal breath sounds.  Abdominal:     General: Bowel sounds are normal.     Palpations: Abdomen is soft.  Musculoskeletal:     Right lower leg: No edema.     Left lower leg: No edema.  Lymphadenopathy:     Cervical: No cervical adenopathy.  Skin:    General: Skin is warm and dry.  Neurological:     General: No focal deficit present.     Mental Status: He is alert.  Psychiatric:        Mood and Affect: Mood normal.        Behavior: Behavior normal.  Thought Content: Thought content normal.        Judgment: Judgment normal.    CBC    Component Value Date/Time   WBC 8.6 02/27/2021 0224   RBC 4.73 02/27/2021 0224   HGB 14.5 02/27/2021 0224   HGB 15.5 08/08/2019 0813   HCT 46.1 02/27/2021 0224   HCT 47.1 08/08/2019 0813   PLT 207 02/27/2021 0224   PLT 205 08/08/2019 0813   MCV 97.5 02/27/2021 0224   MCV 91 08/08/2019 0813   MCH 30.7 02/27/2021 0224   MCHC 31.5 02/27/2021 0224   RDW 14.6 02/27/2021 0224   RDW 15.9 (H) 08/08/2019 0813   LYMPHSABS 1.4 02/19/2021 1031   MONOABS 0.6 02/19/2021 1031   EOSABS 0.0 02/19/2021 1031   BASOSABS 0.0 02/19/2021 1031   BMP Latest Ref Rng & Units 02/27/2021 02/26/2021 02/25/2021  Glucose 70 - 99 mg/dL 95 93 161(W102(H)  BUN 6 - 20 mg/dL 12 14 14   Creatinine 0.61 - 1.24 mg/dL 9.600.90 4.540.72 0.980.85  BUN/Creat Ratio 9 - 20 - - -  Sodium 135 - 145 mmol/L 142 141 140  Potassium 3.5 - 5.1 mmol/L 3.9 4.1 4.5  Chloride 98 - 111 mmol/L 96(L) 102 98  CO2 22 - 32 mmol/L 38(H) 34(H) 33(H)  Calcium 8.9 - 10.3 mg/dL 11.910.2 14.710.0 9.8   Chest imaging: NM PET Scan 04/22/21 1. The previously demonstrated subpleural density posteromedially in the left lower lobe has resolved, consistent with resolved atelectasis or inflammation. No evidence of thoracic malignancy. 2. No suspicious hypermetabolic activity within the  neck, chest, abdomen or pelvis. 3. Chronic right hemidiaphragm elevation and right basilar atelectasis. Cholelithiasis. Aortic Atherosclerosis  CT Chest 03/08/21 1. There is a subpleural density within the posterior medial left lower lobe measuring up to 2.9 cm. Differential considerations include consolidation due to pneumonia versus underlying malignancy. Consider one of the following in 3 months for both low-risk and high-risk individuals: (a) repeat chest CT, (b) follow-up PET-CT, or (c) tissue sampling. This recommendation follows the consensus statement: Guidelines for Management of Incidental Pulmonary Nodules Detected on CT Images: From the Fleischner Society 2017; Radiology 2017; 284:228-243. 2. Marked asymmetric elevation of the right hemidiaphragm with partial atelectasis of the right middle lobe and right lower lobe. 3. Aortic atherosclerosis. 4. Gallstones.   PFT: No flowsheet data found.  Labs:  Path:  Echo 02/20/21: LV EF 60-65%. RV size and systolic function are normal. RVSP 25.15mmHg. Prolapse of the posterior MV leaflet with eccentric anterior MR difficult to quantify. Mild to moderate mitral valve regurgitation. LA and RA normal size.  Heart Catheterization:  Vas US Lower Extremity 02/20/21 Acute DVT of right and left peroneal veins     Assessment & Plan:   Acute respiratory failure, unspecified whether with hypoxia or hypercapnia (HCC) - Plan: Pulmonary Function Test  Deep vein thrombosis (DVT) of proximal lower extremity, unspecified chronicity, unspecified laterality (HCC)  Elevated diaphragm - Plan: Pulmonary Function Test  Discussion: Danny Norris is a 61 year old male, never smoker with history of hypertension, mitral valve regurgitation due to MV prolapse, pulmonary nodule, elevated right hemidiaphragm and DVT who returns to pulmonary clinic for chronic hypoxemic respiratory failure.   Patient continues to experience exertional SpO2 desaturations  with ambulation as evidenced on 6 MWT at pulmonary rehab on room air. We will order the patient a portable oxygen concentrator to use 2L O2 with exertion.   Given his ongoing oxygen needs with exertion, this is concerning for pulmonary hypertension in setting of  valvular disease vs known OSA vs possible CTEPH given his history of DVT.  Continue CPAP therapy for OSA.   We will check pulmonary function tests. We will refer patient to cardiology for consideration of right heart catheterization.   Follow up in 3 months.   65 minutes were spent on this visit which included record review, direct patient encounter, and completion of plan/documentation.  Melody Comas, MD [facility] Office: [phone]   Current Outpatient Medications:    amLODipine (NORVASC) 5 MG tablet, Take 5 mg by mouth daily., Disp: , Rfl:    apixaban (ELIQUIS) 5 MG TABS tablet, Apixaban 10 mg po bid x 3 days, than 5 mg po bid, Disp: 66 tablet, Rfl: 1   atorvastatin (LIPITOR) 20 MG tablet, Take 1 tablet (20 mg total) by mouth daily at 6 PM., Disp: 30 tablet, Rfl: 6   docusate sodium (COLACE) 100 MG capsule, Take 1 capsule (100 mg total) by mouth 2 (two) times daily., Disp: 10 capsule, Rfl: 0   hydrochlorothiazide (HYDRODIURIL) 25 MG tablet, TAKE 1 TABLET BY MOUTH EVERY DAY (Patient taking differently: Take 25 mg by mouth daily.), Disp: 90 tablet, Rfl: 1   metoprolol succinate (TOPROL-XL) 50 MG 24 hr tablet, TAKE 1 TABLET BY MOUTH DAILY WITH OR IMMEDIATELY FOLLOWING A MEAL (Patient taking differently: Take 50 mg by mouth daily at 6 (six) AM.), Disp: 90 tablet, Rfl: 1

## 2021-10-12 VITALS — Ht 72.0 in | Wt 247.2 lb

## 2021-10-12 DIAGNOSIS — J9601 Acute respiratory failure with hypoxia: Secondary | ICD-10-CM

## 2021-10-12 NOTE — Progress Notes (Signed)
Daily Session Note  Patient Details  Name: KRYSTAL DELDUCA MRN: [id number] Date of Birth: 20-Jun-1961 Referring Provider:   Flowsheet Row Pulmonary Rehab from 06/01/2021 in [facility]  Referring Provider [name], MD       Encounter Date: 10/12/2021  Check In:  Session Check In - 10/12/21 0912       Check-In   Supervising physician immediately available to respond to emergencies See telemetry face sheet for immediately available ER MD    Location [facility]-Cardiac & Pulmonary Rehab    Staff Present Birdie Sons, MPA, RN;Amanda Oletta Darter, BA, ACSM CEP, Exercise Physiologist;Jessica Luan Pulling, MA, RCEP, CCRP, CCET    Virtual Visit No    Medication changes reported     No    Fall or balance concerns reported    No    Tobacco Cessation No Change    Warm-up and Cool-down Performed on first and last piece of equipment    Resistance Training Performed Yes    VAD Patient? No    PAD/SET Patient? No      Pain Assessment   Currently in Pain? No/denies                Social History   Tobacco Use  Smoking Status Never  Smokeless Tobacco Never    Goals Met:  Independence with exercise equipment Exercise tolerated well No report of concerns or symptoms today Strength training completed today  Goals Unmet:  Not Applicable  Comments:  Hyatt graduated today from  rehab with 36 sessions completed.  Details of the patient's exercise prescription and what He needs to do in order to continue the prescription and progress were discussed with patient.  Patient was given a copy of prescription and goals.  Patient verbalized understanding.  Ahamed plans to continue to exercise by walking at home and using staff videos.   [place] Name 06/01/21 0947 09/23/21 0940 10/12/21 0930     6 Minute Walk   Phase Initial Discharge Discharge  Pt requested walk on room air for MD   Distance 1385 feet 1350 feet 1675 feet   Distance % Change -- -3 % 21.3 %    Distance Feet Change -- -35 ft 295 ft   Walk Time 6 minutes 6 minutes 6 minutes   # of Rest Breaks 0 0 0   MPH 2.62 2.56 3.17   METS 3.73 4 4   RPE 13 11 12    Perceived Dyspnea  2 1 2    VO2 Peak 13.07 14.01 14.05   Symptoms Yes (comment) No Yes (comment)   Comments SOB -- SOB   Resting HR 62 bpm 104 bpm 70 bpm   Resting BP 140/82 138/78 126/64   Resting Oxygen Saturation  96 % 97 % 91 %   Exercise Oxygen Saturation  during 6 min walk 91 % 91 % 86 %   Max Ex. HR 106 bpm 127 bpm 110 bpm   Max Ex. BP 166/84 180/88 148/70   2 Minute Post BP 144/84 148/82 132/74     Interval HR   1 Minute HR 79 110 94   2 Minute HR 96 106 106   3 Minute HR 101 116 110   4 Minute HR 103 127 109   5 Minute HR 106 126 108   6 Minute HR 105 127 108   2 Minute Post HR 67 111 78   Interval Heart Rate? Yes Yes Yes  Interval Oxygen   Interval Oxygen? Yes Yes Yes   Baseline Oxygen Saturation % 96 % 97 % 91 %   1 Minute Oxygen Saturation % 93 % 93 % 90 %   1 Minute Liters of Oxygen 2 L 2 L 0 L  Room Air   2 Minute Oxygen Saturation % 92 % 91 % 87 %   2 Minute Liters of Oxygen 2 L 2 L 0 L   3 Minute Oxygen Saturation % 93 % 92 % 88 %   3 Minute Liters of Oxygen 2 L 2 L 0 L   4 Minute Oxygen Saturation % 92 % 92 % 86 %   4 Minute Liters of Oxygen 2 L 2 L 0 L   5 Minute Oxygen Saturation % 91 % 92 % 87 %   5 Minute Liters of Oxygen 2 L 2 L 0 L   6 Minute Oxygen Saturation % 92 % 92 % 86 %   6 Minute Liters of Oxygen 2 L 2 L 0 L   2 Minute Post Oxygen Saturation % 94 % 94 % 92 %   2 Minute Post Liters of Oxygen 2 L 2 L 0 L             Dr. Emily Filbert is Medical Director for [place].  Dr. Ottie Glazier is Medical Director for [facility] Pulmonary Rehabilitation.

## 2021-10-12 NOTE — Progress Notes (Signed)
Pulmonary Individual Treatment Plan  Patient Details  Name: Danny Norris MRN: [id number] Date of Birth: 28-Nov-1960 Referring Provider:   Flowsheet Row Pulmonary Rehab from 06/01/2021 in [facility] Cardiac and Pulmonary Rehab  Referring Provider [name], MD       Initial Encounter Date:  Flowsheet Row Pulmonary Rehab from 06/01/2021 in [facility] Cardiac and Pulmonary Rehab  Date 06/01/21       Visit Diagnosis: Acute respiratory failure with hypoxia ([place])  Patient's Home Medications on Admission:  Current Outpatient Medications:    amLODipine (NORVASC) 5 MG tablet, Take 5 mg by mouth daily., Disp: , Rfl:    apixaban (ELIQUIS) 5 MG TABS tablet, Apixaban 10 mg po bid x 3 days, than 5 mg po bid, Disp: 66 tablet, Rfl: 1   atorvastatin (LIPITOR) 20 MG tablet, Take 1 tablet (20 mg total) by mouth daily at 6 PM., Disp: 30 tablet, Rfl: 6   docusate sodium (COLACE) 100 MG capsule, Take 1 capsule (100 mg total) by mouth 2 (two) times daily., Disp: 10 capsule, Rfl: 0   hydrochlorothiazide (HYDRODIURIL) 25 MG tablet, TAKE 1 TABLET BY MOUTH EVERY DAY (Patient taking differently: Take 25 mg by mouth daily.), Disp: 90 tablet, Rfl: 1   metoprolol succinate (TOPROL-XL) 50 MG 24 hr tablet, TAKE 1 TABLET BY MOUTH DAILY WITH OR IMMEDIATELY FOLLOWING A MEAL (Patient taking differently: Take 50 mg by mouth daily at 6 (six) AM.), Disp: 90 tablet, Rfl: 1  Past Medical History: Past Medical History:  Diagnosis Date   Dyslipidemia    HTN (hypertension)    Mitral valve regurgitation    a. 05/2011: TEE showing significant MVP especially involving the P2 segment without evidence for ruptured chordae and had eccentric MR anteriorly directed. b. 03/2015: echo showing EF 65-70% with posterior MV leaflet thickening and prolapse with mild MR.   Normal coronary arteries    a. normal cors by cath in 08/2011    Tobacco Use: Social History   Tobacco Use  Smoking Status Never  Smokeless Tobacco Never     Labs: Recent Review Flowsheet Data     Labs for ITP Cardiac and Pulmonary Rehab Latest Ref Rng & Units 08/08/2019 12/24/2019 02/19/2021 02/20/2021 02/21/2021   Cholestrol 100 - 199 mg/dL 167 132 - - -   LDLCALC 0 - 99 mg/dL 110(H) 79 - - -   HDL >39 mg/dL 40 37(L) - - -   Trlycerides <150 mg/dL 89 81 - 93 -   Hemoglobin A1c 4.8 - 5.6 % - - 6.3(H) - -   PHART 7.350 - 7.450 - - 7.383 7.576(H) 7.463(H)   PCO2ART 32.0 - 48.0 mmHg - - 57.5(H) 31.0(L) 41.2   HCO3 20.0 - 28.0 mmol/L - - 34.0(H) 28.8(H) 29.5(H)   TCO2 22 - 32 mmol/L - - 36(H) - 31   ACIDBASEDEF 0.0 - 2.0 mmol/L - - - - -   O2SAT % - - 99.0 99.2 92.0        Pulmonary Assessment Scores:  Pulmonary Assessment Scores     Row Name 06/01/21 0950 10/05/21 0928       ADL UCSD   ADL Phase Entry Exit    SOB Score total 18 32    Rest 0 0    Walk 2 0    Stairs 1 2    Bath 0 0    Dress 0 0    Shop 1 2      CAT Score   CAT Score 7 12  mMRC Score   mMRC Score 1 1             UCSD: Self-administered rating of dyspnea associated with activities of daily living (ADLs) 6-point scale (0 = "not at all" to 5 = "maximal or unable to do because of breathlessness")  Scoring Scores range from 0 to 120.  Minimally important difference is 5 units  CAT: CAT can identify the health impairment of COPD patients and is better correlated with disease progression.  CAT has a scoring range of zero to 40. The CAT score is classified into four groups of low (less than 10), medium (10 - 20), high (21-30) and very high (31-40) based on the impact level of disease on health status. A CAT score over 10 suggests significant symptoms.  A worsening CAT score could be explained by an exacerbation, poor medication adherence, poor inhaler technique, or progression of COPD or comorbid conditions.  CAT MCID is 2 points  mMRC: mMRC (Modified Medical Research Council) Dyspnea Scale is used to assess the degree of baseline functional disability in  patients of respiratory disease due to dyspnea. No minimal important difference is established. A decrease in score of 1 point or greater is considered a positive change.   Pulmonary Function Assessment:   Exercise Target Goals: Exercise Program Goal: Individual exercise prescription set using results from initial 6 min walk test and THRR while considering  patients activity barriers and safety.   Exercise Prescription Goal: Initial exercise prescription builds to 30-45 minutes a day of aerobic activity, 2-3 days per week.  Home exercise guidelines will be given to patient during program as part of exercise prescription that the participant will acknowledge.  Education: Aerobic Exercise: - Group verbal and visual presentation on the components of exercise prescription. Introduces F.I.T.T principle from ACSM for exercise prescriptions.  Reviews F.I.T.T. principles of aerobic exercise including progression. Written material given at graduation. Flowsheet Row Pulmonary Rehab from 09/30/2021 in [facility] Cardiac and Pulmonary Rehab  Date 09/30/21  Educator [facility]  Instruction Review Code 1- Verbalizes Understanding       Education: Resistance Exercise: - Group verbal and visual presentation on the components of exercise prescription. Introduces F.I.T.T principle from ACSM for exercise prescriptions  Reviews F.I.T.T. principles of resistance exercise including progression. Written material given at graduation. Flowsheet Row Pulmonary Rehab from 09/30/2021 in [facility] Cardiac and Pulmonary Rehab  Date 08/05/21  Educator as  Instruction Review Code 1- Verbalizes Understanding        Education: Exercise & Equipment Safety: - Individual verbal instruction and demonstration of equipment use and safety with use of the equipment. Flowsheet Row Pulmonary Rehab from 09/30/2021 in [facility] Cardiac and Pulmonary Rehab  Education need identified 06/01/21  Date 06/01/21  Educator [place]  Instruction Review Code 1-  Verbalizes Understanding       Education: Exercise Physiology & General Exercise Guidelines: - Group verbal and written instruction with models to review the exercise physiology of the cardiovascular system and associated critical values. Provides general exercise guidelines with specific guidelines to those with heart or lung disease.  Flowsheet Row Pulmonary Rehab from 09/30/2021 in [facility] Cardiac and Pulmonary Rehab  Date 07/22/21  Educator [facility]  Instruction Review Code 1- Verbalizes Understanding       Education: Flexibility, Balance, Mind/Body Relaxation: - Group verbal and visual presentation with interactive activity on the components of exercise prescription. Introduces F.I.T.T principle from ACSM for exercise prescriptions. Reviews F.I.T.T. principles of flexibility and balance exercise training including progression. Also discusses  the mind body connection.  Reviews various relaxation techniques to help reduce and manage stress (i.e. Deep breathing, progressive muscle relaxation, and visualization). Balance handout provided to take home. Written material given at graduation. Flowsheet Row Pulmonary Rehab from 09/30/2021 in [facility] Cardiac and Pulmonary Rehab  Date 06/10/21  Educator AS  Instruction Review Code 1- Verbalizes Understanding       Activity Barriers & Risk Stratification:  Activity Barriers & Cardiac Risk Stratification - 06/01/21 0944       Activity Barriers & Cardiac Risk Stratification   Activity Barriers Shortness of Breath;Joint Problems;Deconditioning             6 Minute Walk:  6 Minute Walk     Row Name 06/01/21 0947 09/23/21 0940 10/12/21 0930     6 Minute Walk   Phase Initial Discharge Discharge  Pt requested walk on room air for MD   Distance 1385 feet 1350 feet 1675 feet   Distance % Change -- -3 % 21.3 %   Distance Feet Change -- -35 ft 295 ft   Walk Time 6 minutes 6 minutes 6 minutes   # of Rest Breaks 0 0 0   MPH 2.62 2.56 3.17   METS  3.73 4 4   RPE 13 11 12    Perceived Dyspnea  2 1 2    VO2 Peak 13.07 14.01 14.05   Symptoms Yes (comment) No Yes (comment)   Comments SOB -- SOB   Resting HR 62 bpm 104 bpm 70 bpm   Resting BP 140/82 138/78 126/64   Resting Oxygen Saturation  96 % 97 % 91 %   Exercise Oxygen Saturation  during 6 min walk 91 % 91 % 86 %   Max Ex. HR 106 bpm 127 bpm 110 bpm   Max Ex. BP 166/84 180/88 148/70   2 Minute Post BP 144/84 148/82 132/74     Interval HR   1 Minute HR 79 110 94   2 Minute HR 96 106 106   3 Minute HR 101 116 110   4 Minute HR 103 127 109   5 Minute HR 106 126 108   6 Minute HR 105 127 108   2 Minute Post HR 67 111 78   Interval Heart Rate? Yes Yes Yes     Interval Oxygen   Interval Oxygen? Yes Yes Yes   Baseline Oxygen Saturation % 96 % 97 % 91 %   1 Minute Oxygen Saturation % 93 % 93 % 90 %   1 Minute Liters of Oxygen 2 L 2 L 0 L  Room Air   2 Minute Oxygen Saturation % 92 % 91 % 87 %   2 Minute Liters of Oxygen 2 L 2 L 0 L   3 Minute Oxygen Saturation % 93 % 92 % 88 %   3 Minute Liters of Oxygen 2 L 2 L 0 L   4 Minute Oxygen Saturation % 92 % 92 % 86 %   4 Minute Liters of Oxygen 2 L 2 L 0 L   5 Minute Oxygen Saturation % 91 % 92 % 87 %   5 Minute Liters of Oxygen 2 L 2 L 0 L   6 Minute Oxygen Saturation % 92 % 92 % 86 %   6 Minute Liters of Oxygen 2 L 2 L 0 L   2 Minute Post Oxygen Saturation % 94 % 94 % 92 %   2 Minute Post Liters of Oxygen 2  L 2 L 0 L           Oxygen Initial Assessment:  Oxygen Initial Assessment - 06/01/21 0949       Home Oxygen   Home Oxygen Device E-Tanks    Sleep Oxygen Prescription Continuous    Liters per minute 2    Home Exercise Oxygen Prescription None    Home Resting Oxygen Prescription None    Compliance with Home Oxygen Use Yes      Initial 6 min Walk   Oxygen Used Continuous    Liters per minute 2      Program Oxygen Prescription   Program Oxygen Prescription Continuous    Liters per minute 2      Intervention    Short Term Goals To learn and understand importance of monitoring SPO2 with pulse oximeter and demonstrate accurate use of the pulse oximeter.;To learn and understand importance of maintaining oxygen saturations>88%;To learn and exhibit compliance with exercise, home and travel O2 prescription;To learn and demonstrate proper pursed lip breathing techniques or other breathing techniques. ;To learn and demonstrate proper use of respiratory medications    Long  Term Goals Exhibits compliance with exercise, home  and travel O2 prescription;Verbalizes importance of monitoring SPO2 with pulse oximeter and return demonstration;Maintenance of O2 saturations>88%;Compliance with respiratory medication;Exhibits proper breathing techniques, such as pursed lip breathing or other method taught during program session;Demonstrates proper use of MDIs             Oxygen Re-Evaluation:  Oxygen Re-Evaluation     Row Name 06/08/21 0933 06/17/21 0950 07/22/21 0956 08/17/21 0935 09/09/21 0934     Program Oxygen Prescription   Program Oxygen Prescription Continuous Continuous Continuous;E-Tanks Continuous;E-Tanks Continuous;E-Tanks   Liters per minute 2 2 2 2 2      Home Oxygen   Home Oxygen Device E-Tanks E-Tanks E-Tanks E-Tanks E-Tanks   Sleep Oxygen Prescription Continuous Continuous;CPAP Continuous;CPAP Continuous;CPAP Continuous;CPAP   Liters per minute 2 2  recommended 2 2 2    Home Exercise Oxygen Prescription None None  not currently prescribed None None None   Home Resting Oxygen Prescription None None None None None   Compliance with Home Oxygen Use Yes Yes Yes Yes Yes     Goals/Expected Outcomes   Short Term Goals To learn and understand importance of monitoring SPO2 with pulse oximeter and demonstrate accurate use of the pulse oximeter.;To learn and understand importance of maintaining oxygen saturations>88%;To learn and exhibit compliance with exercise, home and travel O2 prescription;To learn and  demonstrate proper pursed lip breathing techniques or other breathing techniques.  To learn and understand importance of monitoring SPO2 with pulse oximeter and demonstrate accurate use of the pulse oximeter.;To learn and understand importance of maintaining oxygen saturations>88%;To learn and exhibit compliance with exercise, home and travel O2 prescription;To learn and demonstrate proper pursed lip breathing techniques or other breathing techniques.  To learn and understand importance of monitoring SPO2 with pulse oximeter and demonstrate accurate use of the pulse oximeter.;To learn and understand importance of maintaining oxygen saturations>88%;To learn and exhibit compliance with exercise, home and travel O2 prescription;To learn and demonstrate proper pursed lip breathing techniques or other breathing techniques.  To learn and understand importance of monitoring SPO2 with pulse oximeter and demonstrate accurate use of the pulse oximeter.;To learn and understand importance of maintaining oxygen saturations>88%;To learn and exhibit compliance with exercise, home and travel O2 prescription;To learn and demonstrate proper pursed lip breathing techniques or other breathing techniques.  To learn and understand importance  of monitoring SPO2 with pulse oximeter and demonstrate accurate use of the pulse oximeter.;To learn and understand importance of maintaining oxygen saturations>88%;To learn and exhibit compliance with exercise, home and travel O2 prescription;To learn and demonstrate proper pursed lip breathing techniques or other breathing techniques.    Long  Term Goals Exhibits compliance with exercise, home  and travel O2 prescription;Verbalizes importance of monitoring SPO2 with pulse oximeter and return demonstration;Maintenance of O2 saturations>88%;Exhibits proper breathing techniques, such as pursed lip breathing or other method taught during program session;Compliance with respiratory medication Exhibits  compliance with exercise, home  and travel O2 prescription;Verbalizes importance of monitoring SPO2 with pulse oximeter and return demonstration;Maintenance of O2 saturations>88%;Exhibits proper breathing techniques, such as pursed lip breathing or other method taught during program session;Compliance with respiratory medication Exhibits compliance with exercise, home  and travel O2 prescription;Verbalizes importance of monitoring SPO2 with pulse oximeter and return demonstration;Maintenance of O2 saturations>88%;Exhibits proper breathing techniques, such as pursed lip breathing or other method taught during program session;Compliance with respiratory medication Exhibits compliance with exercise, home  and travel O2 prescription;Verbalizes importance of monitoring SPO2 with pulse oximeter and return demonstration;Maintenance of O2 saturations>88%;Exhibits proper breathing techniques, such as pursed lip breathing or other method taught during program session;Compliance with respiratory medication Exhibits compliance with exercise, home  and travel O2 prescription;Verbalizes importance of monitoring SPO2 with pulse oximeter and return demonstration;Maintenance of O2 saturations>88%;Exhibits proper breathing techniques, such as pursed lip breathing or other method taught during program session;Compliance with respiratory medication   Comments Reviewed PLB technique with pt.  Talked about how it works and it's importance in maintaining their exercise saturations. Danny Norris is doing well with his oxygen in class and wearing it at night.  He has been prescribed a CPAP but has not gotten it yet.  We talked about monitoring his oxygen closely as he goes back to work this week to see if he needs oxygen while he is working.  He is planning to take his pulse oximeter with Danny Norris.  Overall, he is doing well and uses his inhaler regularly.  He does not have a spacer but we will get Danny Norris one soon. Danny Norris is doing well in rehab. He is doing  well with his oxygen and CPAP.  He has gotten a new mask for his CPAP and now has  a better seal with it.  He is doing well with his pursed lip breathing too. Danny Norris is doing well in rehab.  His breathing is improving and he is using his PLB.  He is compliant with his CPAP.  He has not been using it for exercise at home, but does watch his sats closely while he is walking. Danny Norris is doing well in rehab, his breathing continues to improves and he continues to practice PLB. He continues to use CPAP with no issues. Danny Norris reports when he saw his doctor yesterday, MD reported Marcellis was doing well. He continues to track his O2 at home.   Goals/Expected Outcomes Short: Become more profiecient at using PLB.   Long: Become independent at using PLB. Short: Monitor oxygen levels at work Long:Continue to improve SOB and using PLB Short: Continue to use new mask Long: Conitnued compliance. Short: Continue to watch sats walking Long: COnitnued compliance Short: Continue to watch sats walking Long: Continued compliance            Oxygen Discharge (Final Oxygen Re-Evaluation):  Oxygen Re-Evaluation - 09/09/21 0934       Program Oxygen Prescription   Program  Oxygen Prescription Continuous;E-Tanks    Liters per minute 2      Home Oxygen   Home Oxygen Device E-Tanks    Sleep Oxygen Prescription Continuous;CPAP    Liters per minute 2    Home Exercise Oxygen Prescription None    Home Resting Oxygen Prescription None    Compliance with Home Oxygen Use Yes      Goals/Expected Outcomes   Short Term Goals To learn and understand importance of monitoring SPO2 with pulse oximeter and demonstrate accurate use of the pulse oximeter.;To learn and understand importance of maintaining oxygen saturations>88%;To learn and exhibit compliance with exercise, home and travel O2 prescription;To learn and demonstrate proper pursed lip breathing techniques or other breathing techniques.     Long  Term Goals Exhibits compliance with  exercise, home  and travel O2 prescription;Verbalizes importance of monitoring SPO2 with pulse oximeter and return demonstration;Maintenance of O2 saturations>88%;Exhibits proper breathing techniques, such as pursed lip breathing or other method taught during program session;Compliance with respiratory medication    Comments Danny Norris is doing well in rehab, his breathing continues to improves and he continues to practice PLB. He continues to use CPAP with no issues. Danny Norris reports when he saw his doctor yesterday, MD reported Danny Norris was doing well. He continues to track his O2 at home.    Goals/Expected Outcomes Short: Continue to watch sats walking Long: Continued compliance             Initial Exercise Prescription:  Initial Exercise Prescription - 06/01/21 1000       Date of Initial Exercise RX and Referring Provider   Date 06/01/21    Referring Provider [name], MD      Oxygen   Oxygen Continuous    Liters 2    Maintain Oxygen Saturation 88% or higher      Treadmill   MPH 2.3    Grade 0.5    Minutes 15    METs 2.92      Recumbant Bike   Level 2    RPM 60    Watts 30    Minutes 15    METs 3.7      REL-XR   Level 2    Speed 50    Minutes 15    METs 3.7      Prescription Details   Frequency (times per week) 2    Duration Progress to 30 minutes of continuous aerobic without signs/symptoms of physical distress      Intensity   THRR 40-80% of Max Heartrate 101-141    Ratings of Perceived Exertion 11-13    Perceived Dyspnea 0-4      Progression   Progression Continue to progress workloads to maintain intensity without signs/symptoms of physical distress.      Resistance Training   Training Prescription Yes    Weight 4 lb    Reps 10-15             Perform Capillary Blood Glucose checks as needed.  Exercise Prescription Changes:   Exercise Prescription Changes     Row Name 06/01/21 1000 06/10/21 1500 06/17/21 0900 06/22/21 1400 07/05/21 1500      Response to Exercise   Blood Pressure (Admit) 140/82 122/68 -- 126/64 138/68   Blood Pressure (Exercise) 166/84 172/78 -- 120/64 146/82   Blood Pressure (Exit) 144/84 124/78 -- 114/66 124/70   Heart Rate (Admit) 62 bpm 68 bpm -- 74 bpm 69 bpm   Heart Rate (Exercise) 106 bpm 92 bpm --  105 bpm 84 bpm   Heart Rate (Exit) 67 bpm 76 bpm -- 90 bpm 77 bpm   Oxygen Saturation (Admit) 96 % 94 % -- 91 % 94 %   Oxygen Saturation (Exercise) 91 % 93 % -- 93 % 92 %   Oxygen Saturation (Exit) 94 % 94 % -- 94 % 91 %   Rating of Perceived Exertion (Exercise) 13 12 -- 12 12   Perceived Dyspnea (Exercise) 2 2 -- 2 2   Symptoms SOB SOB -- -- SOB   Comments walk test results first full day exercise -- -- --   Duration -- Progress to 30 minutes of  aerobic without signs/symptoms of physical distress -- Continue with 30 min of aerobic exercise without signs/symptoms of physical distress. Continue with 30 min of aerobic exercise without signs/symptoms of physical distress.   Intensity -- THRR unchanged -- THRR unchanged THRR unchanged     Progression   Progression -- Continue to progress workloads to maintain intensity without signs/symptoms of physical distress. -- Continue to progress workloads to maintain intensity without signs/symptoms of physical distress. Continue to progress workloads to maintain intensity without signs/symptoms of physical distress.   Average METs -- 2.82 -- 2.82 3.08     Resistance Training   Training Prescription -- Yes -- Yes Yes   Weight -- 4 lb -- 4 lb 4 lb   Reps -- 10-15 -- 10-15 10-15     Interval Training   Interval Training -- -- -- -- No     Oxygen   Oxygen -- Continuous -- Continuous Continuous   Liters -- 2 -- 2 2     Treadmill   MPH -- 2.3 -- 2.3 2.3   Grade -- 0.5 -- 0.5 0.5   Minutes -- 15 -- 15 15   METs -- 2.92 -- 2.92 2.92     Recumbant Bike   Level -- 2 -- 3 3   Watts -- 25 -- 25 16   Minutes -- 15 -- 15 15   METs -- 2.73 -- 2.72 2.55     REL-XR    Level -- -- -- -- 2   Minutes -- -- -- -- 15   METs -- -- -- -- 3.9     Home Exercise Plan   Plans to continue exercise at -- -- Home (comment)  walking, staff videos Home (comment)  walking, staff videos Home (comment)  walking, staff videos   Frequency -- -- Add 2 additional days to program exercise sessions. Add 2 additional days to program exercise sessions. Add 2 additional days to program exercise sessions.   Initial Home Exercises Provided -- -- 06/17/21 06/17/21 06/17/21     Oxygen   Maintain Oxygen Saturation -- 88% or higher -- -- 88% or higher    Row Name 08/02/21 1000 08/17/21 1400 08/31/21 1400 09/13/21 1000 09/29/21 1300     Response to Exercise   Blood Pressure (Admit) 124/62 122/72 126/64 122/60 120/64   Blood Pressure (Exercise) 172/62 -- -- -- --   Blood Pressure (Exit) 128/72 112/58 104/56 112/58 118/66   Heart Rate (Admit) 75 bpm 67 bpm 64 bpm 74 bpm 67 bpm   Heart Rate (Exercise) 109 bpm 104 bpm 104 bpm 103 bpm 105 bpm   Heart Rate (Exit) 85 bpm 82 bpm 77 bpm 97 bpm 70 bpm   Oxygen Saturation (Admit) 92 % 95 % 96 % 93 % 94 %   Oxygen Saturation (Exercise) 93 % 93 % 92 %  91 % 92 %   Oxygen Saturation (Exit) 93 % 92 % 93 % 93 % 95 %   Rating of Perceived Exertion (Exercise) 13 12 11 12 11    Perceived Dyspnea (Exercise) 2 2 2 2 2    Symptoms SOB SOB SOB SOB SOB   Duration Continue with 30 min of aerobic exercise without signs/symptoms of physical distress. Continue with 30 min of aerobic exercise without signs/symptoms of physical distress. Continue with 30 min of aerobic exercise without signs/symptoms of physical distress. Continue with 30 min of aerobic exercise without signs/symptoms of physical distress. Continue with 30 min of aerobic exercise without signs/symptoms of physical distress.   Intensity THRR unchanged THRR unchanged THRR unchanged THRR unchanged THRR unchanged     Progression   Progression Continue to progress workloads to maintain intensity without  signs/symptoms of physical distress. Continue to progress workloads to maintain intensity without signs/symptoms of physical distress. Continue to progress workloads to maintain intensity without signs/symptoms of physical distress. Continue to progress workloads to maintain intensity without signs/symptoms of physical distress. Continue to progress workloads to maintain intensity without signs/symptoms of physical distress.   Average METs 4.03 4.4 3.72 4.26 3.7     Resistance Training   Training Prescription Yes Yes Yes Yes Yes   Weight 4 lb 4 lb 4 lb 4 lb 4 lb   Reps 10-15 10-15 10-15 10-15 10-15     Interval Training   Interval Training No No No No No     Oxygen   Oxygen Continuous Continuous Continuous Continuous Continuous   [place] 2 2 2 2 2      Treadmill   MPH 2.9 3 3 3  2.8   Grade 4 4 4 4 4    Minutes 15 15 15 15 15    METs 4.82 4.95 4.95 4.95 4.7     Recumbant Bike   Level 7 9 9 12 10    Watts -- 55 60 54 --   Minutes 15 15 15 15 15    METs 2.71 3.86 3.72 3.57 2.7     REL-XR   Level 3 -- 6 8 --   Minutes 15 -- 15 15 --   METs 4.75 -- 2.5 -- --     Home Exercise Plan   Plans to continue exercise at Home (comment)  walking, staff videos Home (comment)  walking, staff videos Home (comment)  walking, staff videos Home (comment)  walking, staff videos Home (comment)  walking, staff videos   Frequency Add 2 additional days to program exercise sessions. Add 2 additional days to program exercise sessions. Add 2 additional days to program exercise sessions. Add 2 additional days to program exercise sessions. Add 2 additional days to program exercise sessions.   Initial Home Exercises Provided 06/17/21 06/17/21 06/17/21 06/17/21 06/17/21     Oxygen   Maintain Oxygen Saturation 88% or higher 88% or higher 88% or higher 88% or higher 88% or higher            Exercise Comments:   Exercise Comments     Row Name 06/08/21 0932 10/12/21 0913         Exercise Comments First  full day of exercise!  Patient was oriented to gym and equipment including functions, settings, policies, and procedures.  Patient's individual exercise prescription and treatment plan were reviewed.  All starting workloads were established based on the results of the 6 minute walk test done at initial orientation visit.  The plan for exercise progression was also introduced and  progression will be customized based on patient's performance and goals. Maclane graduated today from  rehab with 36 sessions completed.  Details of the patient's exercise prescription and what He needs to do in order to continue the prescription and progress were discussed with patient.  Patient was given a copy of prescription and goals.  Patient verbalized understanding.  Kalan plans to continue to exercise by walking at home and using staff videos.               Exercise Goals and Review:   Exercise Goals     Row Name 06/01/21 1055             Exercise Goals   Increase Physical Activity Yes       Intervention Provide advice, education, support and counseling about physical activity/exercise needs.;Develop an individualized exercise prescription for aerobic and resistive training based on initial evaluation findings, risk stratification, comorbidities and participant's personal goals.       Expected Outcomes Short Term: Attend rehab on a regular basis to increase amount of physical activity.;Long Term: Add in home exercise to make exercise part of routine and to increase amount of physical activity.;Long Term: Exercising regularly at least 3-5 days a week.       Increase Strength and Stamina Yes       Intervention Provide advice, education, support and counseling about physical activity/exercise needs.;Develop an individualized exercise prescription for aerobic and resistive training based on initial evaluation findings, risk stratification, comorbidities and participant's personal goals.       Expected Outcomes Short  Term: Increase workloads from initial exercise prescription for resistance, speed, and METs.;Short Term: Perform resistance training exercises routinely during rehab and add in resistance training at home;Long Term: Improve cardiorespiratory fitness, muscular endurance and strength as measured by increased METs and functional capacity (6MWT)       Able to understand and use rate of perceived exertion (RPE) scale Yes       Intervention Provide education and explanation on how to use RPE scale       Expected Outcomes Short Term: Able to use RPE daily in rehab to express subjective intensity level;Long Term:  Able to use RPE to guide intensity level when exercising independently       Able to understand and use Dyspnea scale Yes       Intervention Provide education and explanation on how to use Dyspnea scale       Expected Outcomes Short Term: Able to use Dyspnea scale daily in rehab to express subjective sense of shortness of breath during exertion;Long Term: Able to use Dyspnea scale to guide intensity level when exercising independently       Knowledge and understanding of Target Heart Rate Range (THRR) Yes       Intervention Provide education and explanation of THRR including how the numbers were predicted and where they are located for reference       Expected Outcomes Short Term: Able to state/look up THRR;Long Term: Able to use THRR to govern intensity when exercising independently;Short Term: Able to use daily as guideline for intensity in rehab       Able to check pulse independently Yes       Intervention Provide education and demonstration on how to check pulse in carotid and radial arteries.;Review the importance of being able to check your own pulse for safety during independent exercise       Expected Outcomes Short Term: Able to explain why pulse checking is  important during independent exercise;Long Term: Able to check pulse independently and accurately       Understanding of Exercise  Prescription Yes       Intervention Provide education, explanation, and written materials on patient's individual exercise prescription       Expected Outcomes Short Term: Able to explain program exercise prescription;Long Term: Able to explain home exercise prescription to exercise independently                Exercise Goals Re-Evaluation :  Exercise Goals Re-Evaluation     Row Name 06/08/21 0932 06/10/21 1549 06/17/21 0937 06/22/21 1418 07/05/21 1542     Exercise Goal Re-Evaluation   Exercise Goals Review Increase Physical Activity;Able to understand and use rate of perceived exertion (RPE) scale;Knowledge and understanding of Target Heart Rate Range (THRR);Understanding of Exercise Prescription;Increase Strength and Stamina;Able to understand and use Dyspnea scale;Able to check pulse independently Increase Physical Activity;Understanding of Exercise Prescription;Increase Strength and Stamina Increase Physical Activity;Understanding of Exercise Prescription;Increase Strength and Stamina;Able to understand and use rate of perceived exertion (RPE) scale;Able to understand and use Dyspnea scale;Able to check pulse independently;Knowledge and understanding of Target Heart Rate Range (THRR) Increase Physical Activity;Increase Strength and Stamina Increase Physical Activity;Increase Strength and Stamina;Understanding of Exercise Prescription   Comments Reviewed RPE and dyspnea scales, THR and program prescription with pt today.  Pt voiced understanding and was given a copy of goals to take home. Danny Norris came for the first several sessions of rehab and has done well thus far. Staff will continue to monitor his progress as patient progresses through the program. Danny Norris is doing well in rehab. He is starting to feel like his stamina is starting to recover.  Reviewed home exercise with pt today.  Pt plans to walk at park and use staff videos at home for exercise.  Reviewed THR, pulse, RPE, sign and symptoms,  pulse oximetery and when to call 911 or MD.  Also discussed weather considerations and indoor options.  Pt voiced understanding. Danny Norris is tolerating exercise well in his first sessions.  He has rache dhis THR one session.  Staff will review THR range and encourage Danny Norris to increase levels to get to THR range Danny Norris is doing well in rehab.  He is up to 3.9 METs on the XR.  We will continue to monitor his progress.   Expected Outcomes Short: Use RPE daily to regulate intensity. Long: Follow program prescription in THR. Short: Continue to follow exercise prescription Long: Continue to increase overall MET level Short: Start to add in walking at least one extra day at home Long: continue to improve stamina. Short: work in [employer] range Long:  improve overall stamina Short: Push treadmill back up to 2.3 mph Long: Conitnue to improve stamina    Row Name 07/22/21 0950 08/02/21 1032 08/17/21 0930 08/31/21 1358 09/09/21 0934     Exercise Goal Re-Evaluation   Exercise Goals Review Increase Physical Activity;Increase Strength and Stamina;Understanding of Exercise Prescription Increase Physical Activity;Increase Strength and Stamina Increase Physical Activity;Increase Strength and Stamina;Understanding of Exercise Prescription Increase Physical Activity;Increase Strength and Stamina;Understanding of Exercise Prescription Increase Physical Activity;Increase Strength and Stamina;Understanding of Exercise Prescription   Comments Danny Norris is doing well in rehab. He does get out at least 2 extra days to walk each week.  He would like ot do three days, but usually will find something comes up or weather doesn't work. He is feeling better with his strength and stamina and getting stronger. Danny Norris continues to do well  in rehab. He has significantly increased his loads on the treadmill to 2.9 speed with a 4% incline and tolerating it well. His recumbant bike has increased to level 7. Will continue to monitor his progression. Danny Norris is doing well  in rehab. He is walking some on his off days.  Yesterday, he went to the park to walk to enjoy the fall air.  He is starting to feel a little stronger now. Danny Norris is doing well in rehab.  He is up to 60 watts on the bike.  We will encourage Danny Norris to try 5 lb weights.  We will continue to monitor his progress. He uses his stationary bike due to the cold weather - he uses it about 2x/week for about 40-45 minutes. He also does a warm up and cool down.   Expected Outcomes Short: Add in more exercise at home.  Long: Continue to improve stamina. Short: Continue to work within [place]: Continue to increase overall MET level Short: Continue to exercise on off days Long: conitnue to improve stamina. Short: Try 5 lb weights Long: Continue to improve strength Short: continue to exercise while not at rehab Long: Continue to improve strength    Row Name 09/13/21 1032 09/29/21 1321 09/29/21 1322 09/29/21 1323 10/05/21 0941     Exercise Goal Re-Evaluation   Exercise Goals Review Increase Physical Activity;Increase Strength and Stamina;Understanding of Exercise Prescription Increase Physical Activity;Increase Strength and Stamina -- -- Increase Physical Activity;Increase Strength and Stamina   Comments Danny Norris is doing well in rehab.  He is up to 54 watts at level 12 on the bike and 3.0 mph on the treadmill.  We will continue to monitor her progress. -- Danny Norris is nearing completion of [facility]. He has improved average MET level and has increased levels on all equipment. Danny Norris is doing well and is getting ready to graduate next week. Overall, he feels his endurance improved the most and he still continues to watch his HR and O2 during exercise at home. Danny Norris and his sister in law plan to join [employer] once he is done with the program. He is very motivated to continue exercise outside of rehab.   Expected Outcomes Short: Try 5 lb weights Long: Continue to improve stamina -- -- Short: complete [facility] Long:  maintain exercise on  his own Short: Graduate Long: Continue to exercise at home independently at appropriate prescription            Discharge Exercise Prescription (Final Exercise Prescription Changes):  Exercise Prescription Changes - 09/29/21 1300       Response to Exercise   Blood Pressure (Admit) 120/64    Blood Pressure (Exit) 118/66    Heart Rate (Admit) 67 bpm    Heart Rate (Exercise) 105 bpm    Heart Rate (Exit) 70 bpm    Oxygen Saturation (Admit) 94 %    Oxygen Saturation (Exercise) 92 %    Oxygen Saturation (Exit) 95 %    Rating of Perceived Exertion (Exercise) 11    Perceived Dyspnea (Exercise) 2    Symptoms SOB    Duration Continue with 30 min of aerobic exercise without signs/symptoms of physical distress.    Intensity THRR unchanged      Progression   Progression Continue to progress workloads to maintain intensity without signs/symptoms of physical distress.    Average METs 3.7      Resistance Training   Training Prescription Yes    Weight 4 lb    Reps 10-15  Interval Training   Interval Training No      Oxygen   Oxygen Continuous    Liters 2      Treadmill   MPH 2.8    Grade 4    Minutes 15    METs 4.7      Recumbant Bike   Level 10    Minutes 15    METs 2.7      Home Exercise Plan   Plans to continue exercise at Home (comment)   walking, staff videos   Frequency Add 2 additional days to program exercise sessions.    Initial Home Exercises Provided 06/17/21      Oxygen   Maintain Oxygen Saturation 88% or higher             Nutrition:  Target Goals: Understanding of nutrition guidelines, daily intake of sodium <1580m, cholesterol <209m calories 30% from fat and 7% or less from saturated fats, daily to have 5 or more servings of fruits and vegetables.  Education: All About Nutrition: -Group instruction provided by verbal, written material, interactive activities, discussions, models, and posters to present general guidelines for heart healthy  nutrition including fat, fiber, MyPlate, the role of sodium in heart healthy nutrition, utilization of the nutrition label, and utilization of this knowledge for meal planning. Follow up email sent as well. Written material given at graduation. [facility]  Education need identified 06/01/21  Date 08/19/21  Educator [facility]ruction Review Code 1- Verbalizes Understanding       Biometrics:  Pre Biometrics - 06/01/21 0944       Pre Biometrics   Height 6' (1.829 m)    Weight 238 lb 9.6 oz (108.2 kg)    BMI (Calculated) 32.35    Single Leg Stand 3.93 seconds             Post Biometrics - 10/12/21 0931        Post  Biometrics   Height 6' (1.829 m)    Weight 247 lb 3.2 oz (112.1 kg)    BMI (Calculated) 33.52             Nutrition Therapy Plan and Nutrition Goals:  Nutrition Therapy & Goals - 07/20/21 0845       Nutrition Therapy   Diet Heart healthy, low Na, pulmonary friendly    Protein (specify units) 85-90g    Fiber 30 grams    Whole Grain Foods 3 servings    Saturated Fats 12 max. grams    Fruits and Vegetables 8 servings/day    Sodium 1.5 grams      Personal Nutrition Goals   Nutrition Goal ST: bring lunch to work (PB and Jelly on whole wheat, tuna salad with whole wheat crackers, vegetable and chicken on whole wheat) LT: limit saturated fat to <12g/day, limit Na to <2g/day, eat a variety of 8 fruits/vegetables per day, eat out <2 days per week.    Comments B: bacon and eggs 2x/week or cereal (grapenuts, fiber one with whole milk) L: at work: chicken salad sandwich with chips and fruit D: varies: hamburger, chicken with (brown) rice, quinoa, barley, potatoes with a salad and nonstarchy vegetables. She uses some olive oil with cooking and applesauce with baking. Drinks: coffee (french vanilla creamer), herbal tea, water. No shortness of breath with meals. His sister in-law Danny Norris enthusiastic  and spoke frequently during the consultation; sister in-law is a heEngineer, maintenancend was inputting  additional education and suggestions during consultation. Reviewed heart healthy eating from nutrition education and pulmonary MNT. Manasseh would like to first work on his lunch and would like to start bringing lunch to work.      Intervention Plan   Intervention Prescribe, educate and counsel regarding individualized specific dietary modifications aiming towards targeted core components such as weight, hypertension, lipid management, diabetes, heart failure and other comorbidities.    Expected Outcomes Short Term Goal: Understand basic principles of dietary content, such as calories, fat, sodium, cholesterol and nutrients.;Short Term Goal: A plan has been developed with personal nutrition goals set during dietitian appointment.;Long Term Goal: Adherence to prescribed nutrition plan.             Nutrition Assessments:  MEDIFICTS Score Key: ?70 Need to make dietary changes  40-70 Heart Healthy Diet ? 40 Therapeutic Level Cholesterol Diet  Flowsheet Row Pulmonary Rehab from 10/05/2021 in [facility]  Picture Your Plate Total Score on Admission 49  Picture Your Plate Total Score on Discharge 48      Picture Your Plate Scores: <21 Unhealthy dietary pattern with much room for improvement. 41-50 Dietary pattern unlikely to meet recommendations for good health and room for improvement. 51-60 More healthful dietary pattern, with some room for improvement.  >60 Healthy dietary pattern, although there may be some specific behaviors that could be improved.   Nutrition Goals Re-Evaluation:  Nutrition Goals Re-Evaluation     [place] Name 06/17/21 0941 08/17/21 0933 09/09/21 0940 10/05/21 0939       Goals   Nutrition Goal Meet with dietitian ST: bring lunch to work (PB and Jelly on whole wheat, tuna salad with whole wheat crackers, vegetable and chicken on whole wheat) LT: limit  saturated fat to <12g/day, limit Na to <2g/day, eat a variety of 8 fruits/vegetables per day, eat out <2 days per week. ST: try some ideas for lunch we brainstormed (besides just sandwiches: spring rolls, leftovers from dinner, wraps, soups/stews, veggies as snacks (he was thinking carrots and peanuts for a snack) LT: bring luch 5x/week instead of going out to eat. ST: try some ideas for lunch we brainstormed (besides just sandwiches: spring rolls, leftovers from dinner, wraps, soups/stews, veggies as snacks (he was thinking carrots and peanuts for a snack) LT: bring luch 5x/week instead of going out to eat.    Comment Danny Norris is doing well in rehab.  He needs to meet with dietitian yet.  He is already working on low salt.  We did talk about making sure he gets enough protein and enough fruits and vegetables. Danny Norris is doing well in rehab.  He is doing okay with his diet. He has not quite tried the whole wheat bread for lunch yet.  He has started to increase his fruits and vegetables.  He has added in prunes for breakfast.  He is has cut back some on eating out. He has tried having tuna with whole wheat bread. He does not always bring lunch to work, he still sometimes goes out for lunch; he now brings his lunch 3 days a week instead of going out to eat. Brainstormed some lunch ideas besides just sandwiches: spring rolls, leftovers from dinner, wraps, soups/stews, veggies as snacks (he was thinking carrots and peanuts for a snack). Danny Norris is getting ready to graduate next week and has not made any new goals regarding his diet at this time. He will continue to work on the goals established by the RD. Overall, he is  pleased with staff and changes within the program.    Expected Outcome Short: Meet dietitian Long: Add in more protein and balance SHort: Try whole wheat for lunch Long: COnitnue to add in variety. ST: try some ideas for lunch we brainstormed (besides just sandwiches: spring rolls, leftovers from dinner, wraps,  soups/stews, veggies as snacks (he was thinking carrots and peanuts for a snack) LT: bring luch 5x/week instead of going out to eat. Short: Graduate Long: Continue pulmonary based healthy eating diet             Nutrition Goals Discharge (Final Nutrition Goals Re-Evaluation):  Nutrition Goals Re-Evaluation - 10/05/21 0939       Goals   Nutrition Goal ST: try some ideas for lunch we brainstormed (besides just sandwiches: spring rolls, leftovers from dinner, wraps, soups/stews, veggies as snacks (he was thinking carrots and peanuts for a snack) LT: bring luch 5x/week instead of going out to eat.    Comment Danny Norris is getting ready to graduate next week and has not made any new goals regarding his diet at this time. He will continue to work on the goals established by the RD. Overall, he is pleased with staff and changes within the program.    Expected Outcome Short: Graduate Long: Continue pulmonary based healthy eating diet             Psychosocial: Target Goals: Acknowledge presence or absence of significant depression and/or stress, maximize coping skills, provide positive support system. Participant is able to verbalize types and ability to use techniques and skills needed for reducing stress and depression.   Education: Stress, Anxiety, and Depression - Group verbal and visual presentation to define topics covered.  Reviews how body is impacted by stress, anxiety, and depression.  Also discusses healthy ways to reduce stress and to treat/manage anxiety and depression.  Written material given at graduation. Flowsheet Row Pulmonary Rehab from 09/30/2021 in [facility]  Date 09/16/21  Educator [facility]  Instruction Review Code 1- Verbalizes Understanding       Education: Sleep Hygiene -Provides group verbal and written instruction about how sleep can affect your health.  Define sleep hygiene, discuss sleep cycles and impact of sleep habits. Review good sleep hygiene  tips.    Initial Review & Psychosocial Screening:  Initial Psych Review & Screening - 05/21/21 1509       Initial Review   Current issues with Current Sleep Concerns      Family Dynamics   Good Support System? Yes      Barriers   Psychosocial barriers to participate in program There are no identifiable barriers or psychosocial needs.;The patient should benefit from training in stress management and relaxation.      Screening Interventions   Interventions Encouraged to exercise;Provide feedback about the scores to participant;To provide support and resources with identified psychosocial needs    Expected Outcomes Short Term goal: Utilizing psychosocial counselor, staff and physician to assist with identification of specific Stressors or current issues interfering with healing process. Setting desired goal for each stressor or current issue identified.;Long Term Goal: Stressors or current issues are controlled or eliminated.;Short Term goal: Identification and review with participant of any Quality of Life or Depression concerns found by scoring the questionnaire.;Long Term goal: The participant improves quality of Life and PHQ9 Scores as seen by post scores and/or verbalization of changes             Quality of Life Scores:  Scores of 19  and below usually indicate a poorer quality of life in these areas.  A difference of  2-3 points is a clinically meaningful difference.  A difference of 2-3 points in the total score of the Quality of Life Index has been associated with significant improvement in overall quality of life, self-image, physical symptoms, and general health in studies assessing change in quality of life.  PHQ-9: Recent Review Flowsheet Data     Depression screen [facility] 2/9 10/05/2021 06/29/2021 06/01/2021   Decreased Interest 0 1 0   Down, Depressed, Hopeless 1 0 1   PHQ - 2 Score 1 1 1    Altered sleeping 1 1 2    Tired, decreased energy 1 1 1    Change in appetite 1 0 1    Feeling bad or failure about yourself  1 0 1   Trouble concentrating 1 0 1   Moving slowly or fidgety/restless 0 0 0   Suicidal thoughts 0 0 0   PHQ-9 Score 6 3 7    Difficult doing work/chores Not difficult at all Somewhat difficult Somewhat difficult      Interpretation of Total Score  Total Score Depression Severity:  1-4 = Minimal depression, 5-9 = Mild depression, 10-14 = Moderate depression, 15-19 = Moderately severe depression, 20-27 = Severe depression   Psychosocial Evaluation and Intervention:  Psychosocial Evaluation - 05/21/21 1526       Psychosocial Evaluation & Interventions   Interventions Encouraged to exercise with the program and follow exercise prescription    Comments Mr. Redwine breathing has been a concern for Danny Norris for a while. Thankfully he has been able to wean himself off his oxygen during the day. Currently, he requires 2L at night but is getting a sleep study soon to determine if he needs CPAP. His sister in law is involved in his care. He plans on going back to work Sept 19 so he isn't sure what his schedule will look like.    Expected Outcomes Short: attend pulmonary rehab for education and exercise. Long: develop and maintain positive self care habits.    Continue Psychosocial Services  Follow up required by staff             Psychosocial Re-Evaluation:  Psychosocial Re-Evaluation     [place] Name 06/17/21 [phone] 06/29/21 0925 07/22/21 [id number] 08/17/21 0932 09/09/21 0951     Psychosocial Re-Evaluation   Current issues with Current Sleep Concerns Current Stress Concerns;Current Depression Current Stress Concerns;Current Depression Current Stress Concerns;Current Depression Current Stress Concerns;Current Depression   Comments Danny Norris is doing well in rehab.  He is generally a positive guy and tries not to let anything stress Danny Norris out too much.  He is a light sleeper and has always been.  He does have a hard time getting back to sleep.  He has had a sleep study in  the past and is going to get a CPAP ordered. Danny Norris is planning to return to work this weekend and will keep an eye on his oxygen levels. Reviewed patient health questionnaire (PHQ-9) with patient for follow up. Previously, patients score indicated signs/symptoms of depression.  Reviewed to see if patient is improving symptom wise while in program.  Score improved and patient states that it is because they have been able to get out more and feeling better overall. Topher is doing well in rehab.  He denies any major stressors currenty. He has not had as much depression symptoms recently, a few days here and there.  He is still sleeping well  and enjoying coming to class. Taichi doing well in rehab. Overall, he is doing well mentally.  He had some mild depression sneak in on Danny Norris recently.  He is sleeping well most days, but still having some bad nights too. He reports doing well and trying to take issues on as they come up. He feels his mild depression is still here, but feels it is seasonal. He enjoys listening to music to reduce stress. He relies on his sister in-law for support. He continues to sleep well most days, sometimes he is interupted by the bathroom or trouble turning his brain off for the night (thinking about the day).   Expected Outcomes Short: Get CPAP to help with sleep Long: Continue to focus on positive. Short: Continue to attend [facility] regularly for regular exercise and social engagement. Long: Continue to improve symptoms and manage a positive mental state. Short: Continue to exercise for mental boost Long: Continue to stay positve Short: Continue to exercise for mental boost Long: Continue to stay positve Short: Continue to exercise for mental boost  and listen to music Long: Continue to stay positve   Interventions Encouraged to attend Pulmonary Rehabilitation for the exercise Encouraged to attend Pulmonary Rehabilitation for the exercise Encouraged to attend Pulmonary Rehabilitation for the  exercise Encouraged to attend Pulmonary Rehabilitation for the exercise Encouraged to attend Pulmonary Rehabilitation for the exercise   Continue Psychosocial Services  Follow up required by staff -- -- -- Follow up required by staff    [place] Name 10/05/21 0936             Psychosocial Re-Evaluation   Current issues with Current Stress Concerns;Current Depression       Comments Danny Norris has been holding up well mentally. He still relies on listening to music, reading, or even meditation to help with his depression. He does not take any medications for it but does not feel he needs it nor has any interest in starting any. His sister in law is still his biggest supporter. His sleep has been better and still compliant with his CPAP. He is getting ready to graduate next week and feels he benefited the most from increase in endurance. He enjoyed the program and looks forward to continuing exercise outside of rehab.       Expected Outcomes Short: Graduate Long: Maintain posititve attitutude and utilize exercise for stress management       Interventions Encouraged to attend Pulmonary Rehabilitation for the exercise       Continue Psychosocial Services  Follow up required by staff                Psychosocial Discharge (Final Psychosocial Re-Evaluation):  Psychosocial Re-Evaluation - 10/05/21 0936       Psychosocial Re-Evaluation   Current issues with Current Stress Concerns;Current Depression    Comments Danny Norris has been holding up well mentally. He still relies on listening to music, reading, or even meditation to help with his depression. He does not take any medications for it but does not feel he needs it nor has any interest in starting any. His sister in law is still his biggest supporter. His sleep has been better and still compliant with his CPAP. He is getting ready to graduate next week and feels he benefited the most from increase in endurance. He enjoyed the program and looks forward to  continuing exercise outside of rehab.    Expected Outcomes Short: Graduate Long: Maintain posititve attitutude and utilize exercise for stress management  Interventions Encouraged to attend Pulmonary Rehabilitation for the exercise    Continue Psychosocial Services  Follow up required by staff             Education: Education Goals: Education classes will be provided on a weekly basis, covering required topics. Participant will state understanding/return demonstration of topics presented.  Learning Barriers/Preferences:   General Pulmonary Education Topics:  Infection Prevention: - Provides verbal and written material to individual with discussion of infection control including proper hand washing and proper equipment cleaning during exercise session. Flowsheet Row Pulmonary Rehab from 09/30/2021 in [facility] Cardiac and Pulmonary Rehab  Education need identified 06/01/21  Date 06/01/21  Educator Owen  Instruction Review Code 1- Verbalizes Understanding       Falls Prevention: - Provides verbal and written material to individual with discussion of falls prevention and safety. Flowsheet Row Pulmonary Rehab from 09/30/2021 in [facility] Cardiac and Pulmonary Rehab  Education need identified 06/01/21  Date 06/01/21  Educator [place]  Instruction Review Code 1- Verbalizes Understanding       Chronic Lung Disease Review: - Group verbal instruction with posters, models, PowerPoint presentations and videos,  to review new updates, new respiratory medications, new advancements in procedures and treatments. Providing information on websites and "800" numbers for continued self-education. Includes information about supplement oxygen, available portable oxygen systems, continuous and intermittent flow rates, oxygen safety, concentrators, and Medicare reimbursement for oxygen. Explanation of Pulmonary Drugs, including class, frequency, complications, importance of spacers, rinsing mouth after steroid  MDI's, and proper cleaning methods for nebulizers. Review of basic lung anatomy and physiology related to function, structure, and complications of lung disease. Review of risk factors. Discussion about methods for diagnosing sleep apnea and types of masks and machines for OSA. Includes a review of the use of types of environmental controls: home humidity, furnaces, filters, dust mite/pet prevention, HEPA vacuums. Discussion about weather changes, air quality and the benefits of nasal washing. Instruction on Warning signs, infection symptoms, calling MD promptly, preventive modes, and value of vaccinations. Review of effective airway clearance, coughing and/or vibration techniques. Emphasizing that all should Create an Action Plan. Written material given at graduation. Flowsheet Row Pulmonary Rehab from [facility]/30/2021 in [facility] Cardiac and Pulmonary Rehab  Education need identified 06/01/21  Date 07/08/21  Educator [facility]  Instruction Review Code 1- Verbalizes Understanding       AED/CPR: - Group verbal and written instruction with the use of models to demonstrate the basic use of the AED with the basic ABC's of resuscitation.    Anatomy and Cardiac Procedures: - Group verbal and visual presentation and models provide information about basic cardiac anatomy and function. Reviews the testing methods done to diagnose heart disease and the outcomes of the test results. Describes the treatment choices: Medical Management, Angioplasty, or Coronary Bypass Surgery for treating various heart conditions including Myocardial Infarction, Angina, Valve Disease, and Cardiac Arrhythmias.  Written material given at graduation. Flowsheet Row Pulmonary Rehab from 09/30/2021 in [facility] Card[facility] and Pulmonary Rehab  Date 08/05/21  Educator sb  Instruction Review Code 1- Verbalizes Understanding       Medication Safety: - Group verbal and visual instruction to review commonly prescribed medications for heart and lung  disease. Reviews the medication, class of the drug, and side effects. Includes the steps to properly store meds and maintain the prescription regimen.  Written material given at graduation. Flowsheet Row Pulmonary Rehab from 09/30/2021 in [facility] Cardiac and Pulmonary Rehab  Date 06/17/21  Educator KB  Instruction Review  Code 1- Verbalizes Understanding       Other: -Provides group and verbal instruction on various topics (see comments)   Knowledge Questionnaire Score:  Knowledge Questionnaire Score - 10/05/21 0926       Knowledge Questionnaire Score   Pre Score 13/18: ADL, O2, PLB    Post Score 17/18              Core Components/Risk Factors/Patient Goals at Admission:  Personal Goals and Risk Factors at Admission - 06/01/21 1056       Core Components/Risk Factors/Patient Goals on Admission    Weight Management Yes;Weight Loss    Intervention Weight Management: Develop a combined nutrition and exercise program designed to reach desired caloric intake, while maintaining appropriate intake of nutrient and fiber, sodium and fats, and appropriate energy expenditure required for the weight goal.;Weight Management: Provide education and appropriate resources to help participant work on and attain dietary goals.;Weight Management/Obesity: Establish reasonable short term and long term weight goals.    Admit Weight 238 lb (108 kg)    Goal Weight: Short Term 233 lb (105.7 kg)    Goal Weight: Long Term 220 lb (99.8 kg)    Expected Outcomes Short Term: Continue to assess and modify interventions until short term weight is achieved;Long Term: Adherence to nutrition and physical activity/exercise program aimed toward attainment of established weight goal;Weight Loss: Understanding of general recommendations for a balanced deficit meal plan, which promotes 1-2 lb weight loss per week and includes a negative energy balance of [phone] kcal/d;Understanding recommendations for meals to include  15-35% energy as protein, 25-35% energy from fat, 35-60% energy from carbohydrates, less than 273m of dietary cholesterol, 20-35 gm of total fiber daily;Understanding of distribution of calorie intake throughout the day with the consumption of 4-5 meals/snacks    Improve shortness of breath with ADL's Yes    Intervention Provide education, individualized exercise plan and daily activity instruction to help decrease symptoms of SOB with activities of daily living.    Expected Outcomes Short Term: Improve cardiorespiratory fitness to achieve a reduction of symptoms when performing ADLs;Long Term: Be able to perform more ADLs without symptoms or delay the onset of symptoms    Hypertension Yes    Intervention Monitor prescription use compliance.;Provide education on lifestyle modifcations including regular physical activity/exercise, weight management, moderate sodium restriction and increased consumption of fresh fruit, vegetables, and low fat dairy, alcohol moderation, and smoking cessation.    Expected Outcomes Short Term: Continued assessment and intervention until BP is < 140/980mHG in hypertensive participants. < 130/80102mG in hypertensive participants with diabetes, heart failure or chronic kidney disease.;Long Term: Maintenance of blood pressure at goal levels.    Lipids Yes    Intervention Provide education and support for participant on nutrition & aerobic/resistive exercise along with prescribed medications to achieve LDL <73m11mDL >40mg87m Expected Outcomes Short Term: Participant states understanding of desired cholesterol values and is compliant with medications prescribed. Participant is following exercise prescription and nutrition guidelines.;Long Term: Cholesterol controlled with medications as prescribed, with individualized exercise RX and with personalized nutrition plan. Value goals: LDL < 73mg,17m > 40 mg.             Education:Diabetes - Individual verbal and written  instruction to review signs/symptoms of diabetes, desired ranges of glucose level fasting, after meals and with exercise. Acknowledge that pre and post exercise glucose checks will be done for 3 sessions at entry of program.   Know Your Numbers  and Heart Failure: - Group verbal and visual instruction to discuss disease risk factors for cardiac and pulmonary disease and treatment options.  Reviews associated critical values for Overweight/Obesity, Hypertension, Cholesterol, and Diabetes.  Discusses basics of heart failure: signs/symptoms and treatments.  Introduces Heart Failure Zone chart for action plan for heart failure.  Written material given at graduation. Flowsheet Row Pulmonary Rehab from 09/30/2021 in [facility] Cardiac and Pulmonary Rehab  Date 07/01/21  Educator KB  Instruction Review Code 1- Verbalizes Understanding       Core Components/Risk Factors/Patient Goals Review:   Goals and Risk Factor Review     Row Name 06/17/21 [phone] 07/22/21 0953 08/17/21 0934 09/09/21 0937 10/05/21 0944     Core Components/Risk Factors/Patient Goals Review   Personal Goals Review Weight Management/Obesity;Increase knowledge of respiratory medications and ability to use respiratory devices properly.;Improve shortness of breath with ADL's;Hypertension;Lipids Weight Management/Obesity;Increase knowledge of respiratory medications and ability to use respiratory devices properly.;Improve shortness of breath with ADL's;Hypertension;Lipids Weight Management/Obesity;Increase knowledge of respiratory medications and ability to use respiratory devices properly.;Improve shortness of breath with ADL's;Hypertension;Lipids Weight Management/Obesity;Improve shortness of breath with ADL's;Hypertension Weight Management/Obesity;Improve shortness of breath with ADL's;Hypertension   Review [place] is doing well in rehab.  His weight is still going up and down depending on what he is eating.  His blood pressures have reading higher  at home than here by about 30 pts.  His pressures tend to run slightly elevated anyways.  We talked about bringing in his blood pressure cuff. His breathing is improving and he is able to do more.  He is using his inhaler and doing well. He does not have a spacer yet and we will get Danny Norris one once back in stock. Dagon is doing well in rehab.  His weight is trending up again and he admits to needing to buckle down on his diet.  He just met with dietian this week, so he is hoping that will help some.  He is doing well  with inhalers and meds.  His breathing is doing better.  Pressures have continued to do well. He did bring in his cuff and decided he needed a new one. Zyrus is doing well in rehab.  His weight is holding steady and he is still trying to get it down more.  His breathing is getting better and doing well with his inhalers.  Blood pressures still doing well in class and he did get a new cuff for home. Ryin is doing well in rehab, his breathing continues to improves and he continues to practice PLB. He continues to use CPAP with no issues. Jex reports when he saw his doctor yesterday, MD reported Noble was doing well. He continues to track his O2 at home. His weight continues to be stable, he weighs himself every other day. He continues to use his inhalers and reports no issues at this time. He continues to check his BP at home 1x/day in the am. He takes all of his medications as prescribed with no issues. Davon continues to do well. He is still using his CPAP with no issues. His weight has increased about 1-2 lbs but thinks it is due to his diet with the holidays. He is aware to keep an eye on it and report any abnormal weight changes. His BP at home tends to stay in the [id number] systolic. He is staying compliant with his medications and I encouraged Danny Norris to talk to his doctor about his BP if it tends to  stay on the higher end. He is seeing his pulmonologist next Monday for a follow up. He is graduating next  week and enjoyed the program thus far.   Expected Outcomes Short: Get spacer for inhaler and bring in cuff  Long: Continue to work on weight loss SHort: Continue to use inhaler when needed Long: Continue to work on weight loss SHort: Continue to use inhaler when needed Long: Continue to work on weight loss SHort: Continue to use inhaler when needed Long: Continue to work on [employer] loss Short: Graduate Long: Continue to manage lifestyle risk factors            Core Components/Risk Factors/Patient Goals at Discharge (Final Review):   Goals and Risk Factor Review - 10/05/21 0944       Core Components/Risk Factors/Patient Goals Review   Personal Goals Review Weight Management/Obesity;Improve shortness of breath with ADL's;Hypertension    Review Severiano continues to do well. He is still using his CPAP with no issues. His weight has increased about 1-2 lbs but thinks it is due to his diet with the holidays. He is aware to keep an eye on it and report any abnormal weight changes. His BP at home tends to stay in the [id number] systolic. He is staying compliant with his medications and I encouraged Danny Norris to talk to his doctor about his BP if it tends to stay on the higher end. He is seeing his pulmonologist next Monday for a follow up. He is graduating next week and enjoyed the program thus far.    Expected Outcomes Short: Graduate Long: Continue to manage lifestyle risk factors             ITP Comments:  ITP Comments     Row Name 05/21/21 1520 06/01/21 0940 06/08/21 0932 06/09/21 0607 07/07/21 0819   ITP Comments Initial telephone orientation completed. Diagnosis can be found in [facility] 7/25. EP orientation scheduled for Tuesday 8/30 at 8 am. Completed 6MWT and gym orientation. Initial ITP created and sent for review to Dr. Ottie Glazier, Medical Director. First full day of exercise!  Patient was oriented to gym and equipment including functions, settings, policies, and procedures.  Patient's individual  exercise prescription and treatment plan were reviewed.  All starting workloads were established based on the results of the 6 minute walk test done at initial orientation visit.  The plan for exercise progression was also introduced and progression will be customized based on patient's performance and goals. 30 Day review completed. Medical Director ITP review done, changes made as directed, and signed approval by Medical Director.   New 30 day review completed. ITP sent to Dr. Zetta Bills, Medical Director of Pulmonary Rehab. Continue with ITP unless changes are made by physician.    Row Name 07/20/21 [phone] 08/04/21 0644 09/01/21 0639 09/29/21 0700 10/12/21 0913   ITP Comments Completed initial RD consultation 30 Day review completed. Medical Director ITP review done, changes made as directed, and signed approval by Medical Director. 30 Day review completed. Medical Director ITP review done, changes made as directed, and signed approval by Medical Director. 30 Day review completed. Medical Director ITP review done, changes made as directed, and signed approval by Medical Director. Tali graduated today from  rehab with 36 sessions completed.  Details of the patient's exercise prescription and what He needs to do in order to continue the prescription and progress were discussed with patient.  Patient was given a copy of prescription and goals.  Patient verbalized understanding.  Zarius plans to continue to exercise by walking at home and using staff videos.            Comments: discharge ITP

## 2021-10-12 NOTE — Progress Notes (Signed)
Discharge Progress Report  Patient Details  Name: Danny Norris MRN: [id number] Date of Birth: 12/05/1960 Referring Provider:   Flowsheet Row Pulmonary Rehab from 06/01/2021 in [facility]  Referring Provider Danny Amy, MD        Number of Visits: 36  Reason for Discharge:  Patient reached a stable level of exercise. Patient independent in their exercise. Patient has met program and personal goals.  Smoking History:  Social History   Tobacco Use  Smoking Status Never  Smokeless Tobacco Never    Diagnosis:  Acute respiratory failure with hypoxia ([place])  ADL UCSD:  Pulmonary Assessment Scores     Row Name 06/01/21 [phone] 10/05/21 0928       ADL UCSD   ADL Phase Entry Exit    SOB Score total 18 32    Rest 0 0    Walk 2 0    Stairs 1 2    Bath 0 0    Dress 0 0    Shop 1 2      CAT Score   CAT Score 7 12      mMRC Score   mMRC Score 1 1             Initial Exercise Prescription:  Initial Exercise Prescription - 06/01/21 1000       Date of Initial Exercise RX and Referring Provider   Date 06/01/21    Referring Provider Danny Amy, MD      Oxygen   Oxygen Continuous    Liters 2    Maintain Oxygen Saturation 88% or higher      Treadmill   MPH 2.3    Grade 0.5    Minutes 15    METs 2.92      Recumbant Bike   Level 2    RPM 60    Watts 30    Minutes 15    METs 3.7      REL-XR   Level 2    Speed 50    Minutes 15    METs 3.7      Prescription Details   Frequency (times per week) 2    Duration Progress to 30 minutes of continuous aerobic without signs/symptoms of physical distress      Intensity   THRR 40-80% of Max Heartrate 101-141    Ratings of Perceived Exertion 11-13    Perceived Dyspnea 0-4      Progression   Progression Continue to progress workloads to maintain intensity without signs/symptoms of physical distress.      Resistance Training   Training Prescription Yes    Weight 4 lb    Reps  10-15             Discharge Exercise Prescription (Final Exercise Prescription Changes):  Exercise Prescription Changes - 09/29/21 1300       Response to Exercise   Blood Pressure (Admit) 120/64    Blood Pressure (Exit) 118/66    Heart Rate (Admit) 67 bpm    Heart Rate (Exercise) 105 bpm    Heart Rate (Exit) 70 bpm    Oxygen Saturation (Admit) 94 %    Oxygen Saturation (Exercise) 92 %    Oxygen Saturation (Exit) 95 %    Rating of Perceived Exertion (Exercise) 11    Perceived Dyspnea (Exercise) 2    Symptoms SOB    Duration Continue with 30 min of aerobic exercise without signs/symptoms of physical distress.    Intensity THRR unchanged  Progression   Progression Continue to progress workloads to maintain intensity without signs/symptoms of physical distress.    Average METs 3.7      Resistance Training   Training Prescription Yes    Weight 4 lb    Reps 10-15      Interval Training   Interval Training No      Oxygen   Oxygen Continuous    Liters 2      Treadmill   MPH 2.8    Grade 4    Minutes 15    METs 4.7      Recumbant Bike   Level 10    Minutes 15    METs 2.7      Home Exercise Plan   Plans to continue exercise at Home (comment)   walking, staff videos   Frequency Add 2 additional days to program exercise sessions.    Initial Home Exercises Provided 06/17/21      Oxygen   Maintain Oxygen Saturation 88% or higher             Functional Capacity:  6 Minute Walk     Row Name 06/01/21 0947 09/23/21 0940 10/12/21 0930     6 Minute Walk   Phase Initial Discharge Discharge  Pt requested walk on room air for MD   Distance 1385 feet 1350 feet 1675 feet   Distance % Change -- -3 % 21.3 %   Distance Feet Change -- -35 ft 295 ft   Walk Time 6 minutes 6 minutes 6 minutes   # of Rest Breaks 0 0 0   MPH 2.62 2.56 3.17   METS 3.73 4 4   RPE 13 11 12    Perceived Dyspnea  2 1 2    VO2 Peak 13.07 14.01 14.05   Symptoms Yes (comment) No Yes  (comment)   Comments SOB -- SOB   Resting HR 62 bpm 104 bpm 70 bpm   Resting BP 140/82 138/78 126/64   Resting Oxygen Saturation  96 % 97 % 91 %   Exercise Oxygen Saturation  during 6 min walk 91 % 91 % 86 %   Max Ex. HR 106 bpm 127 bpm 110 bpm   Max Ex. BP 166/84 180/88 148/70   2 Minute Post BP 144/84 148/82 132/74     Interval HR   1 Minute HR 79 110 94   2 Minute HR 96 106 106   3 Minute HR 101 116 110   4 Minute HR 103 127 109   5 Minute HR 106 126 108   6 Minute HR 105 127 108   2 Minute Post HR 67 111 78   Interval Heart Rate? Yes Yes Yes     Interval Oxygen   Interval Oxygen? Yes Yes Yes   Baseline Oxygen Saturation % 96 % 97 % 91 %   1 Minute Oxygen Saturation % 93 % 93 % 90 %   1 Minute Liters of Oxygen 2 L 2 L 0 L  Room Air   2 Minute Oxygen Saturation % 92 % 91 % 87 %   2 Minute Liters of Oxygen 2 L 2 L 0 L   3 Minute Oxygen Saturation % 93 % 92 % 88 %   3 Minute Liters of Oxygen 2 L 2 L 0 L   4 Minute Oxygen Saturation % 92 % 92 % 86 %   4 Minute Liters of Oxygen 2 L 2 L 0 L   5  Minute Oxygen Saturation % 91 % 92 % 87 %   5 Minute Liters of Oxygen 2 L 2 L 0 L   6 Minute Oxygen Saturation % 92 % 92 % 86 %   6 Minute Liters of Oxygen 2 L 2 L 0 L   2 Minute Post Oxygen Saturation % 94 % 94 % 92 %   2 Minute Post Liters of Oxygen 2 L 2 L 0 L            Psychological, QOL, Others - Outcomes: PHQ 2/9: Depression screen [facility] 2/9 10/05/2021 06/29/2021 06/01/2021  Decreased Interest 0 1 0  Down, Depressed, Hopeless 1 0 1  PHQ - 2 Score 1 1 1   Altered sleeping 1 1 2   Tired, decreased energy 1 1 1   Change in appetite 1 0 1  Feeling bad or failure about yourself  1 0 1  Trouble concentrating 1 0 1  Moving slowly or fidgety/restless 0 0 0  Suicidal thoughts 0 0 0  PHQ-9 Score 6 3 7   Difficult doing work/chores Not difficult at all Somewhat difficult Somewhat difficult    Quality of Life:    Nutrition & Weight - Outcomes:  Pre Biometrics - 06/01/21 0944        Pre Biometrics   Height 6' (1.829 m)    Weight 238 lb 9.6 oz (108.2 kg)    BMI (Calculated) 32.35    Single Leg Stand 3.93 seconds             Post Biometrics - 10/12/21 0931        Post  Biometrics   Height 6' (1.829 m)    Weight 247 lb 3.2 oz (112.1 kg)    BMI (Calculated) 33.52             Nutrition:  Nutrition Therapy & Goals - 07/20/21 0845       Nutrition Therapy   Diet Heart healthy, low Na, pulmonary friendly    Protein (specify units) 85-90g    Fiber 30 grams    Whole Grain Foods 3 servings    Saturated Fats 12 max. grams    Fruits and Vegetables 8 servings/day    Sodium 1.5 grams      Personal Nutrition Goals   Nutrition Goal ST: bring lunch to work (PB and Jelly on whole wheat, tuna salad with whole wheat crackers, vegetable and chicken on whole wheat) LT: limit saturated fat to <12g/day, limit Na to <2g/day, eat a variety of 8 fruits/vegetables per day, eat out <2 days per week.    Comments B: bacon and eggs 2x/week or cereal (grapenuts, fiber one with whole milk) L: at work: chicken salad sandwich with chips and fruit D: varies: hamburger, chicken with (brown) rice, quinoa, barley, potatoes with a salad and nonstarchy vegetables. She uses some olive oil with cooking and applesauce with baking. Drinks: coffee (french vanilla creamer), herbal tea, water. No shortness of breath with meals. His sister in-law Levada Dy was enthusiastic and spoke frequently during the consultation; sister in-law is a Engineer, maintenance and was inputting additional education and suggestions during consultation. Reviewed heart healthy eating from nutrition education and pulmonary MNT. Desjuan would like to first work on his lunch and would like to start bringing lunch to work.      Intervention Plan   Intervention Prescribe, educate and counsel regarding individualized specific dietary modifications aiming towards targeted core components such as weight, hypertension, lipid management,  diabetes, heart failure and other comorbidities.  Expected Outcomes Short Term Goal: Understand basic principles of dietary content, such as calories, fat, sodium, cholesterol and nutrients.;Short Term Goal: A plan has been developed with personal nutrition goals set during dietitian appointment.;Long Term Goal: Adherence to prescribed nutrition plan.             Nutrition Discharge:   Education Questionnaire Score:  Knowledge Questionnaire Score - 10/05/21 0926       Knowledge Questionnaire Score   Pre Score 13/18: ADL, O2, PLB    Post Score 17/18             Goals reviewed with patient; copy given to patient.

## 2021-10-12 NOTE — Progress Notes (Signed)
Pt requested to redo walk test without oxygen.  He did better then last post test and improved.  Sats maintained at or above 86% on room air for duration of test.    10/12/21 0930  6 Minute Walk  Phase Discharge (Pt requested walk on room air for MD)  Distance 1675 feet  Distance % Change 21.3 %  Distance Feet Change 295 ft  Walk Time 6 minutes  # of Rest Breaks 0  MPH 3.17  METS 4  RPE 12  Perceived Dyspnea  2  VO2 Peak 14.05  Symptoms Yes (comment)  Comments SOB  Resting HR 70 bpm  Resting BP 126/64  Resting Oxygen Saturation  91 %  Exercise Oxygen Saturation  during 6 min walk 86 %  Max Ex. HR 110 bpm  Max Ex. BP 148/70  2 Minute Post BP 132/74  Interval HR  Interval Heart Rate? Yes  1 Minute HR 94  2 Minute HR 106  3 Minute HR 110  4 Minute HR 109  5 Minute HR 108  6 Minute HR 108  2 Minute Post HR 78  Interval Oxygen  Interval Oxygen? Yes  Baseline Oxygen Saturation % 91 %  1 Minute Oxygen Saturation % 90 %  1 Minute Liters of Oxygen 0 L (Room Air)  2 Minute Oxygen Saturation % 87 %  2 Minute Liters of Oxygen 0 L  3 Minute Oxygen Saturation % 88 %  3 Minute Liters of Oxygen 0 L  4 Minute Oxygen Saturation % 86 %  4 Minute Liters of Oxygen 0 L  5 Minute Oxygen Saturation % 87 %  5 Minute Liters of Oxygen 0 L  6 Minute Oxygen Saturation % 86 %  6 Minute Liters of Oxygen 0 L  2 Minute Post Oxygen Saturation % 92 %  2 Minute Post Liters of Oxygen 0 L   Danny Pierce, MA, [place], CCRP 10/12/2021 9:33 AM

## 2021-10-13 ENCOUNTER — Encounter: Payer: Self-pay | Admitting: Pulmonary Disease

## 2021-10-13 ENCOUNTER — Telehealth: Payer: Self-pay | Admitting: Pulmonary Disease

## 2021-10-13 DIAGNOSIS — R0902 Hypoxemia: Secondary | ICD-10-CM

## 2021-10-13 NOTE — Telephone Encounter (Signed)
Please order patient a portable oxygen concentrator to be used at 2-3L pulsed with ambulation/exercise/exertion based on his recent from pulmonary rehab (results below). Let patient know that he has qualified for the portable oxygen concentrator.   I am also referring him to cardiology for further evaluation and consideration of right heart catheterization to evaluate him for pulmonary hypertension given his heart valve issue.   Thanks, JD  ______________________________________________________________________________ Pt requested to redo walk test without oxygen.  He did better then last post test and improved.  Sats maintained at or above 86% on room air for duration of test.      10/12/21 0930  6 Minute Walk  Phase Discharge (Pt requested walk on room air for MD)  Distance 1675 feet  Distance % Change 21.3 %  Distance Feet Change 295 ft  Walk Time 6 minutes  # of Rest Breaks 0  MPH 3.17  METS 4  RPE 12  Perceived Dyspnea  2  VO2 Peak 14.05  Symptoms Yes (comment)  Comments SOB  Resting HR 70 bpm  Resting BP 126/64  Resting Oxygen Saturation  91 %  Exercise Oxygen Saturation  during 6 min walk 86 %  Max Ex. HR 110 bpm  Max Ex. BP 148/70  2 Minute Post BP 132/74  Interval HR  Interval Heart Rate? Yes  1 Minute HR 94  2 Minute HR 106  3 Minute HR 110  4 Minute HR 109  5 Minute HR 108  6 Minute HR 108  2 Minute Post HR 78  Interval Oxygen  Interval Oxygen? Yes  Baseline Oxygen Saturation % 91 %  1 Minute Oxygen Saturation % 90 %  1 Minute Liters of Oxygen 0 L (Room Air)  2 Minute Oxygen Saturation % 87 %  2 Minute Liters of Oxygen 0 L  3 Minute Oxygen Saturation % 88 %  3 Minute Liters of Oxygen 0 L  4 Minute Oxygen Saturation % 86 %  4 Minute Liters of Oxygen 0 L  5 Minute Oxygen Saturation % 87 %  5 Minute Liters of Oxygen 0 L  6 Minute Oxygen Saturation % 86 %  6 Minute Liters of Oxygen 0 L  2 Minute Post Oxygen Saturation % 92 %  2 Minute Post Liters  of Oxygen 0 L

## 2021-10-13 NOTE — Addendum Note (Signed)
Addended by: Melody Comas on: 10/13/2021 08:32 PM   Modules accepted: Orders

## 2021-10-14 NOTE — Telephone Encounter (Signed)
Called and spoke with patient. He verbalized understanding. When asked if he was already established with an O2 company, he stated no. He was with [employer] last year but he had the O2 discontinued back in September 2022. Advised him that I could place an order and our PCCs could find a company for him. He verbalized understanding.   He is aware of the cardiology referral.   Nothing further needed at time of call.

## 2021-10-15 DIAGNOSIS — G4733 Obstructive sleep apnea (adult) (pediatric): Secondary | ICD-10-CM | POA: Diagnosis not present

## 2021-10-19 DIAGNOSIS — J9691 Respiratory failure, unspecified with hypoxia: Secondary | ICD-10-CM | POA: Diagnosis not present

## 2021-10-19 DIAGNOSIS — G4733 Obstructive sleep apnea (adult) (pediatric): Secondary | ICD-10-CM | POA: Diagnosis not present

## 2021-11-02 DIAGNOSIS — I10 Essential (primary) hypertension: Secondary | ICD-10-CM | POA: Diagnosis not present

## 2021-11-02 DIAGNOSIS — E785 Hyperlipidemia, unspecified: Secondary | ICD-10-CM | POA: Diagnosis not present

## 2021-11-02 DIAGNOSIS — R7303 Prediabetes: Secondary | ICD-10-CM | POA: Diagnosis not present

## 2021-11-08 DIAGNOSIS — R7303 Prediabetes: Secondary | ICD-10-CM | POA: Diagnosis not present

## 2021-11-08 DIAGNOSIS — I10 Essential (primary) hypertension: Secondary | ICD-10-CM | POA: Diagnosis not present

## 2021-11-08 DIAGNOSIS — E785 Hyperlipidemia, unspecified: Secondary | ICD-10-CM | POA: Diagnosis not present

## 2021-11-12 ENCOUNTER — Encounter (HOSPITAL_COMMUNITY): Payer: BC Managed Care – PPO | Admitting: Cardiology

## 2021-11-16 ENCOUNTER — Encounter (HOSPITAL_COMMUNITY): Payer: BC Managed Care – PPO | Admitting: Internal Medicine

## 2021-11-19 DIAGNOSIS — J9691 Respiratory failure, unspecified with hypoxia: Secondary | ICD-10-CM | POA: Diagnosis not present

## 2021-11-26 ENCOUNTER — Other Ambulatory Visit: Payer: BC Managed Care – PPO

## 2021-11-30 ENCOUNTER — Other Ambulatory Visit: Payer: BC Managed Care – PPO

## 2021-12-08 ENCOUNTER — Other Ambulatory Visit: Payer: BC Managed Care – PPO

## 2021-12-13 ENCOUNTER — Other Ambulatory Visit (HOSPITAL_COMMUNITY): Payer: Self-pay | Admitting: *Deleted

## 2021-12-13 ENCOUNTER — Ambulatory Visit (HOSPITAL_COMMUNITY)
Admission: RE | Admit: 2021-12-13 | Discharge: 2021-12-13 | Disposition: A | Discharge status: Home / Self Care | Payer: BC Managed Care – PPO | Source: Ambulatory Visit | Attending: Internal Medicine | Admitting: Internal Medicine

## 2021-12-13 ENCOUNTER — Other Ambulatory Visit: Payer: Self-pay

## 2021-12-13 ENCOUNTER — Encounter (HOSPITAL_COMMUNITY): Payer: Self-pay | Admitting: Internal Medicine

## 2021-12-13 VITALS — BP 160/70 | HR 67 | Wt 251.0 lb

## 2021-12-13 DIAGNOSIS — I2721 Secondary pulmonary arterial hypertension: Secondary | ICD-10-CM | POA: Diagnosis not present

## 2021-12-13 DIAGNOSIS — R0600 Dyspnea, unspecified: Secondary | ICD-10-CM

## 2021-12-13 DIAGNOSIS — R9431 Abnormal electrocardiogram [ECG] [EKG]: Secondary | ICD-10-CM | POA: Insufficient documentation

## 2021-12-13 DIAGNOSIS — J9611 Chronic respiratory failure with hypoxia: Secondary | ICD-10-CM

## 2021-12-13 LAB — CBC
HCT: 52.4 % — ABNORMAL HIGH (ref 39.0–52.0)
Hemoglobin: 17.5 g/dL — ABNORMAL HIGH (ref 13.0–17.0)
MCH: 31.5 pg (ref 26.0–34.0)
MCHC: 33.4 g/dL (ref 30.0–36.0)
MCV: 94.2 fL (ref 80.0–100.0)
Platelets: 171 10*3/uL (ref 150–400)
RBC: 5.56 MIL/uL (ref 4.22–5.81)
RDW: 13.8 % (ref 11.5–15.5)
WBC: 6 10*3/uL (ref 4.0–10.5)
nRBC: 0 % (ref 0.0–0.2)

## 2021-12-13 LAB — BASIC METABOLIC PANEL
Anion gap: 8 (ref 5–15)
BUN: 8 mg/dL (ref 6–20)
CO2: 30 mmol/L (ref 22–32)
Calcium: 10.5 mg/dL — ABNORMAL HIGH (ref 8.9–10.3)
Chloride: 100 mmol/L (ref 98–111)
Creatinine, Ser: 0.8 mg/dL (ref 0.61–1.24)
GFR, Estimated: >60 mL/min (ref >60)
Glucose, Bld: 132 mg/dL — ABNORMAL HIGH (ref 70–99)
Potassium: 3.4 mmol/L — ABNORMAL LOW (ref 3.5–5.1)
Sodium: 138 mmol/L (ref 135–145)

## 2021-12-13 NOTE — Patient Instructions (Signed)
Medication Changes: ? ?None ? ?Lab Work: ? ?Done today, we will call you for abnormal results ? ?Testing/Procedures: ? ?Heart Catheterization, see instructions below ? ?VQ Scan and Chest X-ray, once approved by your insurance company we will call you to schedule this ? ?Referrals: ? ?None ? ?Special Instructions // Education: ? ?None ? ?Follow-Up in: 2-3 months ? ?At the [facility], you and your health needs are our priority. We have a designated team specialized in the treatment of Heart Failure. This Care Team includes your primary Heart Failure Specialized Cardiologist (physician), Advanced Practice Providers (APPs- Physician Assistants and Nurse Practitioners), and Pharmacist who all work together to provide you with the care you need, when you need it.  ? ?You may see any of the following providers on your designated Care Team at your next follow up: ? ?Dr Arvilla Meres ?Dr Marca Ancona ?Tonye Becket, NP ?Robbie Lis, PA ?Jessica Milford,NP ?Anna Genre, PA ?Karle Plumber, PharmD ? ? ?Please be sure to bring in all your medications bottles to every appointment.  ? ?Need to Contact us: ? ?If you have any questions or concerns before your next appointment please send [place] a message through [place] or call our office at [phone].   ? ?TO LEAVE A MESSAGE FOR THE NURSE SELECT OPTION 2, PLEASE LEAVE A MESSAGE INCLUDING: ?YOUR NAME ?DATE OF BIRTH ?CALL BACK NUMBER ?REASON FOR CALL**this is important as we prioritize the call backs ? ?YOU WILL RECEIVE A CALL BACK THE SAME DAY AS LONG AS YOU CALL BEFORE 4:00 PM ? ? ? ?CATHETERIZATION INSTRUCTIONS: ? ?You are scheduled for a Cardiac Catheterization on Monday, March 20 with Dr. Arvilla Meres. ? ?1. Please arrive at the Main Entrance A at [facility]: [address] [place], [place] [address] at 11:30 AM (This time is two hours before your procedure to ensure your preparation). Free valet parking service is available.  ? ?Special  note: Every effort is made to have your procedure done on time. Please understand that emergencies sometimes delay scheduled procedures. ? ?2. Diet: Do not eat solid foods after midnight.  You may have clear liquids until 5 AM upon the day of the procedure. ? ?3. Labs: DONE TODAY ? ?4. Medication instructions in preparation for your procedure: ? ? Sunday 3/19 PM DO NOT TAKE ELIQUIS ? Monday 3/20 AM DO NOT TAKE ELIQUIS OR HCTZ ? ?On the morning of your procedure, take any morning medicines NOT listed above.  You may use sips of water. ? ?5. Plan to go home the same day, you will only stay overnight if medically necessary. ?6. You MUST have a responsible adult to drive you home. ?7. An adult MUST be with you the first 24 hours after you arrive home. ?8. Bring a current list of your medications, and the last time and date medication taken. ?9. Bring ID and current insurance cards. ?10.Please wear clothes that are easy to get on and off and wear slip-on shoes. ? ?Thank you for allowing [place] to care for you! ?  -- [facility] ? ? ?

## 2021-12-13 NOTE — Progress Notes (Incomplete)
[facility] CONSULT NOTE  Referring Physician: Dr. Erin Fulling  Primary Care: Donald Prose, MD Primary Cardiologist:  DrClaiborne Billings    HPI:  Danny Norris is a 61 year old male, never smoker with OSA, chronic hypoxemic respiratory failure (uses only with exercise),  HTN, mitral valve regurgitation due to MV prolapse, pulmonary nodule, elevated right hemidiaphragm and previous bilateral DVT (5/22)  who is referred by Dr. 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   Cath 11/12: No CAD PAP 28/12 PCWP 15  TEE 8/12 Normal EF significant MVP especially involving the P2 segment without evidence for ruptured chordae and had eccentric MR anteriorly directed.  TTE 03/2015: EF 65-70% with posterior MV leaflet thickening and prolapse with mild MR  ECHO 5/22 LV EF 60-65%. Mod LVH. RV size and systolic function are normal. RVSP 25.48mmHg. Prolapse of the posterior MV leaflet with eccentric anterior MR difficult to quantify. Mild to moderate mitral valve regurgitation. LA and RA normal size.   Says problems really started in 5/22. Admitted with acute respiratory failure. Intubated. Found to have bilateral DVT. Did not undergo CT with contrast due to contrast allergy. No VQ scan performed.   Here with his wife. Very difficult historian with evasive answers. Not very active. Only wearing CPAP occasionally. Mild DOE with everyday activities. Mild LE edema. + cough. Occasional orthopnea. Occasional CP.   Review of Systems: [y] = yes, [ ]  = no   General: Weight gain [ ] ; Weight loss [ ] ; Anorexia [ ] ; Fatigue [ y]; Fever [ ] ; Chills [ ] ; Weakness [ ]   Cardiac: Chest pain/pressure [ ] ; Resting SOB [ ] ; Exertional SOB [ y]; Orthopnea [ y]; Pedal Edema Blue.Reese ]; Palpitations [ ] ; Syncope [ ] ; Presyncope [ ] ; Paroxysmal nocturnal dyspnea[ ]   Pulmonary: Cough [ ] ; Wheezing[ ] ; Hemoptysis[ ] ; Sputum [ ] ; Snoring Blue.Reese ]  GI: Vomiting[ ] ; Dysphagia[ ] ; Melena[ ] ; Hematochezia [ ] ; Heartburn[ ] ;  Abdominal pain [ ] ; Constipation [ ] ; Diarrhea [ ] ; BRBPR [ ]   GU: Hematuria[ ] ; Dysuria [ ] ; Nocturia[ ]   Vascular: Pain in legs with walking [ ] ; Pain in feet with lying flat [ ] ; Non-healing sores [ ] ; Stroke [ ] ; TIA [ ] ; Slurred speech [ ] ;  Neuro: Headaches[ ] ; Vertigo[ ] ; Seizures[ ] ; Paresthesias[ ] ;Blurred vision [ ] ; Diplopia [ ] ; Vision changes [ ]   Ortho/Skin: Arthritis [ y]; Joint pain [ y]; Muscle pain [ ] ; Joint swelling [ ] ; Back Pain [ ] ; Rash [ ]   Psych: Depression[ ] ; Anxiety[ ]   Heme: Bleeding problems [ ] ; Clotting disorders [ ] ; Anemia [ ]   Endocrine: Diabetes [ ] ; Thyroid dysfunction[ ]    Past Medical History:  Diagnosis Date   Dyslipidemia    HTN (hypertension)    Mitral valve regurgitation    a. 05/2011: TEE showing significant MVP especially involving the P2 segment without evidence for ruptured chordae and had eccentric MR anteriorly directed. b. 03/2015: echo showing EF 65-70% with posterior MV leaflet thickening and prolapse with mild MR.   Normal coronary arteries    a. normal cors by cath in 08/2011    Current Outpatient Medications  Medication Sig Dispense Refill   amLODipine (NORVASC) 5 MG tablet Take 5 mg by mouth daily.     apixaban (ELIQUIS) 5 MG TABS tablet Take 5 mg by mouth 2 (two) times daily.     atorvastatin (LIPITOR) 20 MG tablet Take 1 tablet (20 mg total) by  mouth daily at 6 PM. 30 tablet 6   docusate sodium (COLACE) 100 MG capsule Take 1 capsule (100 mg total) by mouth 2 (two) times daily. 10 capsule 0   hydrochlorothiazide (HYDRODIURIL) 25 MG tablet TAKE 1 TABLET BY MOUTH EVERY DAY 90 tablet 1   metoprolol succinate (TOPROL-XL) 50 MG 24 hr tablet TAKE 1 TABLET BY MOUTH DAILY WITH OR IMMEDIATELY FOLLOWING A MEAL 90 tablet 1   No current facility-administered medications for this encounter.    Allergies  Allergen Reactions   Contrast Media [Iodinated Contrast Media] Rash      Social History   Socioeconomic History   Marital status:  Single    Spouse name: Not on file   Number of children: Not on file   Years of education: Not on file   Highest education level: Not on file  Occupational History   Not on file  Tobacco Use   Smoking status: Never   Smokeless tobacco: Never  Substance and Sexual Activity   Alcohol use: No    Alcohol/week: 0.0 standard drinks   Drug use: Not on file   Sexual activity: Not on file  Other Topics Concern   Not on file  Social History Narrative   Not on file   Social Determinants of Health   Financial Resource Strain: Not on file  Food Insecurity: Not on file  Transportation Needs: Not on file  Physical Activity: Not on file  Stress: Not on file  Social Connections: Not on file  Intimate Partner Violence: Not on file      Family History  Problem Relation Age of Onset   Sleep apnea Brother    Congestive Heart Failure Mother     Vitals:   12/13/21 1217  BP: (!) 160/70  Pulse: 67  SpO2: 92%  Weight: 113.9 kg (251 lb)    PHYSICAL EXAM: General:  Well appearing. No respiratory difficulty HEENT: normal Neck: supple. JVp 7. Carotids 2+ bilat; no bruits. No lymphadenopathy or thryomegaly appreciated. Cor: PMI nondisplaced. Regular rate & rhythm. 2/6 MR Lungs: clear Abdomen: orthopnea soft, nontender, nondistended. No hepatosplenomegaly. No bruits or masses. Good bowel sounds. Extremities: no cyanosis, clubbing, rash, tr edema Neuro: alert & oriented x 3, cranial nerves grossly intact. moves all 4 extremities w/o difficulty. Affect pleasant.  ECG: NSR 72 No ST-T wave abnormalities. Personally reviewed    ASSESSMENT & PLAN:   1. PAH  - suspect multifactorial  - will need VQ, PFTs and RHC (+/- pulmonary angios) as next steps.   2. Mitral regurgitation due to MVP - this is mild - doubt it has much effect on pulmonary pressures  3. Chronic hypoxemic respiratory failure - follows with Dr. Erin Fulling - continue O2 and CPAP - PAH work-up as above  4. OSA - continue  CPAP  5. Morbid obesity - needs weight loss Glori Bickers, MD  12:33 PM

## 2021-12-13 NOTE — Progress Notes (Signed)
Pt ambulated in hall initial oxygen saturation 92% on room air.Saturation decreased to 88% transiently after walking back and forth a few times.( 300 feet) Pt came up to 90% . ?

## 2021-12-13 NOTE — H&P (View-Only) (Signed)
? ?[facility] CONSULT NOTE ? ?Referring Physician: Dr. Francine Graven  ?Primary Care: Deatra James, MD ?Primary Cardiologist:  Dr. Tresa Endo  ? ? ?HPI: ? ?Danny Norris is a 61 year old male, never smoker with OSA, chronic hypoxemic respiratory failure (uses O2 only with exercise),  HTN, mitral valve regurgitation due to MV prolapse, pulmonary nodule, elevated right hemidiaphragm and previous bilateral DVT (5/22)  who is referred by Dr. 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  ? ?Cath 11/12: No CAD PAP 28/12 PCWP 15 ? ?TEE 8/12 Normal EF significant MVP especially involving the P2 segment without evidence for ruptured chordae and had eccentric MR anteriorly directed. ? ?TTE 03/2015: EF 65-70% with posterior MV leaflet thickening and prolapse with mild MR ? ?ECHO 5/22 LV EF 60-65%. Mod LVH. RV size and systolic function are normal. RVSP 25.50mmHg. Prolapse of the posterior MV leaflet with eccentric anterior MR difficult to quantify. Mild to moderate mitral valve regurgitation. LA and RA normal size.  ? ?Says problems really started in 5/22. Admitted with acute respiratory failure. Intubated. Found to have bilateral DVT. Did not undergo CT with contrast due to contrast allergy. No VQ scan performed.  ? ?Here with his wife. Very difficult historian with evasive answers. Not very active. Only wearing CPAP occasionally. Mild DOE with everyday activities. Mild LE edema. + cough. Occasional orthopnea. Occasional CP. No known h/o connective disease.  ? ?Review of Systems: [y] = yes, [ ]  = no  ? ?General: Weight gain [ ] ; Weight loss [ ] ; Anorexia [ ] ; Fatigue [ y]; Fever [ ] ; Chills [ ] ; Weakness [ ]   ?Cardiac: Chest pain/pressure [ ] ; Resting SOB [ ] ; Exertional SOB [ y]; Orthopnea [ y]; Pedal Edema ]; Palpitations [ ] ; Syncope [ ] ; Presyncope [ ] ; Paroxysmal nocturnal dyspnea[ ]   ?Pulmonary: Cough [ ] ; Wheezing[ ] ; Hemoptysis[ ] ; Sputum [ ] ; Snoring ]  ?GI: Vomiting[ ] ; Dysphagia[ ] ; Melena[  ]; Hematochezia [ ] ; Heartburn[ ] ; Abdominal pain [ ] ; Constipation [ ] ; Diarrhea [ ] ; BRBPR [ ]   ?GU: Hematuria[ ] ; Dysuria [ ] ; Nocturia[ ]   ?Vascular: Pain in legs with walking [ ] ; Pain in feet with lying flat [ ] ; Non-healing sores [ ] ; Stroke [ ] ; TIA [ ] ; Slurred speech [ ] ;  ?Neuro: Headaches[ ] ; Vertigo[ ] ; Seizures[ ] ; Paresthesias[ ] ;Blurred vision [ ] ; Diplopia [ ] ; Vision changes [ ]   ?Ortho/Skin: Arthritis [ y]; Joint pain [ y]; Muscle pain [ ] ; Joint swelling [ ] ; Back Pain [ ] ; Rash [ ]   ?Psych: Depression[ ] ; Anxiety[ ]   ?Heme: Bleeding problems [ ] ; Clotting disorders [ ] ; Anemia [ ]   ?Endocrine: Diabetes [ ] ; Thyroid dysfunction[ ]  ? ? ?Past Medical History:  ?Diagnosis Date  ? Dyslipidemia   ? HTN (hypertension)   ? Mitral valve regurgitation   ? a. 05/2011: TEE showing significant MVP especially involving the P2 segment without evidence for ruptured chordae and had eccentric MR anteriorly directed. b. 03/2015: echo showing EF 65-70% with posterior MV leaflet thickening and prolapse with mild MR.  ? Normal coronary arteries   ? a. normal cors by cath in 08/2011  ? ? ?Current Outpatient Medications  ?Medication Sig Dispense Refill  ? amLODipine (NORVASC) 5 MG tablet Take 5 mg by mouth daily.    ? apixaban (ELIQUIS) 5 MG TABS tablet Take 5 mg by mouth 2 (two) times daily.    ? atorvastatin (LIPITOR) 20 MG tablet Take  1 tablet (20 mg total) by mouth daily at 6 PM. 30 tablet 6  ? docusate sodium (COLACE) 100 MG capsule Take 1 capsule (100 mg total) by mouth 2 (two) times daily. 10 capsule 0  ? hydrochlorothiazide (HYDRODIURIL) 25 MG tablet TAKE 1 TABLET BY MOUTH EVERY DAY 90 tablet 1  ? metoprolol succinate (TOPROL-XL) 50 MG 24 hr tablet TAKE 1 TABLET BY MOUTH DAILY WITH OR IMMEDIATELY FOLLOWING A MEAL 90 tablet 1  ? ?No current facility-administered medications for this encounter.  ? ? ?Allergies  ?Allergen Reactions  ? Contrast Media [Iodinated Contrast Media] Rash  ? ? ?  ?Social History   ? ?Socioeconomic History  ? Marital status: Single  ?  Spouse name: Not on file  ? Number of children: Not on file  ? Years of education: Not on file  ? Highest education level: Not on file  ?Occupational History  ? Not on file  ?Tobacco Use  ? Smoking status: Never  ? Smokeless tobacco: Never  ?Substance and Sexual Activity  ? Alcohol use: No  ?  Alcohol/week: 0.0 standard drinks  ? Drug use: Not on file  ? Sexual activity: Not on file  ?Other Topics Concern  ? Not on file  ?Social History Narrative  ? Not on file  ? ?Social Determinants of Health  ? ?Financial Resource Strain: Not on file  ?Food Insecurity: Not on file  ?Transportation Needs: Not on file  ?Physical Activity: Not on file  ?Stress: Not on file  ?Social Connections: Not on file  ?Intimate Partner Violence: Not on file  ? ? ?  ?Family History  ?Problem Relation Age of Onset  ? Sleep apnea Brother   ? Congestive Heart Failure Mother   ? ? ?Vitals:  ? 12/13/21 1217  ?BP: (!) 160/70  ?Pulse: 67  ?SpO2: 92%  ?Weight: 113.9 kg (251 lb)  ? ? ?PHYSICAL EXAM: ?General:  Well appearing. No respiratory difficulty ?HEENT: normal ?Neck: supple. JVP 7. Carotids 2+ bilat; no bruits. No lymphadenopathy or thryomegaly appreciated. ?Cor: PMI nondisplaced. Regular rate & rhythm. 2/6 MR ?Lungs: clear ?Abdomen: orthopnea soft, nontender, nondistended. No hepatosplenomegaly. No bruits or masses. Good bowel sounds. ?Extremities: no cyanosis, clubbing, rash, tr edema ?Neuro: alert & oriented x 3, cranial nerves grossly intact. moves all 4 extremities w/o difficulty. Affect pleasant. ? ?ECG: NSR 72 No ST-T wave abnormalities. Personally reviewed ? ?Hall walk in clinic today: Pt ambulated in hall initial oxygen saturation 92% on room air.Saturation decreased to 88% transiently after walking back and forth a few times.( 300 feet) Pt came up to 90% . ? ?ASSESSMENT & PLAN:  ? ?1. PAH  ?- appears mild by echo ?- suspect multifactorial  ?- will proceed VQ, PFTs and RHC (+/-  pulmonary angios) as next steps.  ?- suspect deconditioning and elevated R hemidiaphragm may be play a role in his dyspnea ? ?2. Mitral regurgitation due to MVP ?- this is mild ?- doubt it has much effect on pulmonary pressures ? ?3. 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 ?4. Chronic hypoxemic respiratory failure ?- follows with Dr. Francine Graven ?- continue O2 and CPAP ?- PAH work-up as above ? ?5. OSA ?- continue CPAP ? ?6. Morbid obesity ?- needs weight loss ? ?Arvilla Meres, MD  ?12:33 PM ? ? ?

## 2021-12-14 ENCOUNTER — Ambulatory Visit
Admission: RE | Admit: 2021-12-14 | Discharge: 2021-12-14 | Disposition: A | Discharge status: Home / Self Care | Payer: BC Managed Care – PPO | Source: Ambulatory Visit | Attending: Pulmonary Disease | Admitting: Pulmonary Disease

## 2021-12-14 DIAGNOSIS — J9811 Atelectasis: Secondary | ICD-10-CM | POA: Diagnosis not present

## 2021-12-14 DIAGNOSIS — R0902 Hypoxemia: Secondary | ICD-10-CM

## 2021-12-14 DIAGNOSIS — J9600 Acute respiratory failure, unspecified whether with hypoxia or hypercapnia: Secondary | ICD-10-CM

## 2021-12-14 DIAGNOSIS — J9691 Respiratory failure, unspecified with hypoxia: Secondary | ICD-10-CM | POA: Diagnosis not present

## 2021-12-17 DIAGNOSIS — J9691 Respiratory failure, unspecified with hypoxia: Secondary | ICD-10-CM | POA: Diagnosis not present

## 2021-12-20 ENCOUNTER — Telehealth (HOSPITAL_COMMUNITY): Payer: Self-pay

## 2021-12-20 ENCOUNTER — Ambulatory Visit (HOSPITAL_COMMUNITY)
Admission: RE | Admit: 2021-12-20 | Discharge: 2021-12-20 | Disposition: A | Discharge status: Home / Self Care | Payer: BC Managed Care – PPO | Attending: Internal Medicine | Admitting: Internal Medicine

## 2021-12-20 ENCOUNTER — Encounter (HOSPITAL_COMMUNITY)
Admission: RE | Disposition: A | Discharge status: Home / Self Care | Payer: Self-pay | Source: Home / Self Care | Attending: Internal Medicine

## 2021-12-20 DIAGNOSIS — Z79899 Other long term (current) drug therapy: Secondary | ICD-10-CM | POA: Insufficient documentation

## 2021-12-20 DIAGNOSIS — Z7901 Long term (current) use of anticoagulants: Secondary | ICD-10-CM | POA: Diagnosis not present

## 2021-12-20 DIAGNOSIS — Z6820 Body mass index (BMI) 20.0-20.9, adult: Secondary | ICD-10-CM | POA: Diagnosis not present

## 2021-12-20 DIAGNOSIS — J9611 Chronic respiratory failure with hypoxia: Secondary | ICD-10-CM | POA: Insufficient documentation

## 2021-12-20 DIAGNOSIS — I272 Pulmonary hypertension, unspecified: Secondary | ICD-10-CM | POA: Diagnosis not present

## 2021-12-20 DIAGNOSIS — G4733 Obstructive sleep apnea (adult) (pediatric): Secondary | ICD-10-CM | POA: Insufficient documentation

## 2021-12-20 DIAGNOSIS — I10 Essential (primary) hypertension: Secondary | ICD-10-CM | POA: Insufficient documentation

## 2021-12-20 DIAGNOSIS — I340 Nonrheumatic mitral (valve) insufficiency: Secondary | ICD-10-CM | POA: Diagnosis not present

## 2021-12-20 DIAGNOSIS — Z8639 Personal history of other endocrine, nutritional and metabolic disease: Secondary | ICD-10-CM

## 2021-12-20 DIAGNOSIS — R0600 Dyspnea, unspecified: Secondary | ICD-10-CM

## 2021-12-20 HISTORY — PX: RIGHT HEART CATH: CATH118263

## 2021-12-20 LAB — POCT I-STAT EG7
Acid-Base Excess: 4 mmol/L — ABNORMAL HIGH (ref 0.0–2.0)
Acid-Base Excess: 5 mmol/L — ABNORMAL HIGH (ref 0.0–2.0)
Acid-base deficit: 1 mmol/L (ref 0.0–2.0)
Bicarbonate: 24.2 mmol/L (ref 20.0–28.0)
Bicarbonate: 31 mmol/L — ABNORMAL HIGH (ref 20.0–28.0)
Bicarbonate: 31.3 mmol/L — ABNORMAL HIGH (ref 20.0–28.0)
Calcium, Ion: 0.79 mmol/L — CL (ref 1.15–1.40)
Calcium, Ion: 1.28 mmol/L (ref 1.15–1.40)
Calcium, Ion: 1.29 mmol/L (ref 1.15–1.40)
HCT: 40 % (ref 39.0–52.0)
HCT: 49 % (ref 39.0–52.0)
HCT: 50 % (ref 39.0–52.0)
Hemoglobin: 13.6 g/dL (ref 13.0–17.0)
Hemoglobin: 16.7 g/dL (ref 13.0–17.0)
Hemoglobin: 17 g/dL (ref 13.0–17.0)
O2 Saturation: 64 %
O2 Saturation: 71 %
O2 Saturation: 76 %
Potassium: 2.3 mmol/L — CL (ref 3.5–5.1)
Potassium: 3.4 mmol/L — ABNORMAL LOW (ref 3.5–5.1)
Potassium: 3.5 mmol/L (ref 3.5–5.1)
Sodium: 140 mmol/L (ref 135–145)
Sodium: 141 mmol/L (ref 135–145)
Sodium: 150 mmol/L — ABNORMAL HIGH (ref 135–145)
TCO2: 25 mmol/L (ref 22–32)
TCO2: 33 mmol/L — ABNORMAL HIGH (ref 22–32)
TCO2: 33 mmol/L — ABNORMAL HIGH (ref 22–32)
pCO2, Ven: 39.8 mmHg — ABNORMAL LOW (ref 44–60)
pCO2, Ven: 50.3 mmHg (ref 44–60)
pCO2, Ven: 52.8 mmHg (ref 44–60)
pH, Ven: 7.377 (ref 7.25–7.43)
pH, Ven: 7.391 (ref 7.25–7.43)
pH, Ven: 7.402 (ref 7.25–7.43)
pO2, Ven: 35 mmHg (ref 32–45)
pO2, Ven: 38 mmHg (ref 32–45)
pO2, Ven: 41 mmHg (ref 32–45)

## 2021-12-20 LAB — BASIC METABOLIC PANEL
Anion gap: 8 (ref 5–15)
BUN: 7 mg/dL (ref 6–20)
CO2: 29 mmol/L (ref 22–32)
Calcium: 10.1 mg/dL (ref 8.9–10.3)
Chloride: 103 mmol/L (ref 98–111)
Creatinine, Ser: 0.8 mg/dL (ref 0.61–1.24)
GFR, Estimated: >60 mL/min (ref >60)
Glucose, Bld: 141 mg/dL — ABNORMAL HIGH (ref 70–99)
Potassium: 3.6 mmol/L (ref 3.5–5.1)
Sodium: 140 mmol/L (ref 135–145)

## 2021-12-20 SURGERY — RIGHT HEART CATH
Anesthesia: LOCAL

## 2021-12-20 MED ORDER — SODIUM CHLORIDE 0.9% FLUSH: 3.0000 mL | INTRAVENOUS | Status: DC | PRN

## 2021-12-20 MED ORDER — POTASSIUM CHLORIDE CRYS ER 20 MEQ PO TBCR: 20.0000 meq | EXTENDED_RELEASE_TABLET | Freq: Every day | ORAL | 3 refills | Status: DC

## 2021-12-20 MED ORDER — SODIUM CHLORIDE 0.9% FLUSH: 3.0000 mL | Freq: Two times a day (BID) | INTRAVENOUS | Status: DC

## 2021-12-20 MED ORDER — LIDOCAINE HCL (PF) 1 % IJ SOLN
INTRAMUSCULAR | Status: DC | PRN
  Administered 2021-12-20: 2 mL

## 2021-12-20 MED ORDER — LIDOCAINE HCL (PF) 1 % IJ SOLN
INTRAMUSCULAR | Status: AC
  Filled 2021-12-20: qty 30

## 2021-12-20 MED ORDER — ASPIRIN 81 MG PO CHEW
81.0000 mg | CHEWABLE_TABLET | ORAL | Status: AC
  Administered 2021-12-20: 81 mg via ORAL
  Filled 2021-12-20: qty 1

## 2021-12-20 MED ORDER — ONDANSETRON HCL 4 MG/2ML IJ SOLN: 4.0000 mg | Freq: Four times a day (QID) | INTRAMUSCULAR | Status: DC | PRN

## 2021-12-20 MED ORDER — ACETAMINOPHEN 325 MG PO TABS: 650.0000 mg | ORAL_TABLET | ORAL | Status: DC | PRN

## 2021-12-20 MED ORDER — SODIUM CHLORIDE 0.9% FLUSH
3.0000 mL | INTRAVENOUS | Status: DC | PRN
Start: 2021-12-20 — End: 2021-12-20

## 2021-12-20 MED ORDER — LABETALOL HCL 5 MG/ML IV SOLN: 10.0000 mg | INTRAVENOUS | Status: DC | PRN

## 2021-12-20 MED ORDER — SODIUM CHLORIDE 0.9 % IV SOLN: INTRAVENOUS | Status: DC

## 2021-12-20 MED ORDER — HEPARIN (PORCINE) IN NACL 1000-0.9 UT/500ML-% IV SOLN
INTRAVENOUS | Status: DC | PRN
  Administered 2021-12-20: 500 mL

## 2021-12-20 MED ORDER — HEPARIN (PORCINE) IN NACL 1000-0.9 UT/500ML-% IV SOLN
INTRAVENOUS | Status: AC
  Filled 2021-12-20: qty 500

## 2021-12-20 MED ORDER — SODIUM CHLORIDE 0.9 % IV SOLN: 250.0000 mL | INTRAVENOUS | Status: DC | PRN

## 2021-12-20 MED ORDER — HYDRALAZINE HCL 20 MG/ML IJ SOLN: 10.0000 mg | INTRAMUSCULAR | Status: DC | PRN

## 2021-12-20 SURGICAL SUPPLY — 8 items
CATH BALLN WEDGE 5F 110CM (CATHETERS) ×1 IMPLANT
KIT MICROPUNCTURE NIT STIFF (SHEATH) ×1 IMPLANT
PACK CARDIAC CATHETERIZATION (CUSTOM PROCEDURE TRAY) ×3 IMPLANT
PROTECTION STATION PRESSURIZED (MISCELLANEOUS) ×2
SHEATH GLIDE SLENDER 4/5FR (SHEATH) ×1 IMPLANT
SHEATH PROBE COVER 6X72 (BAG) ×1 IMPLANT
STATION PROTECTION PRESSURIZED (MISCELLANEOUS) IMPLANT
TRANSDUCER W/STOPCOCK (MISCELLANEOUS) ×3 IMPLANT

## 2021-12-20 NOTE — Telephone Encounter (Signed)
-----   Message from Jolaine Artist, MD sent at 12/17/2021  4:03 PM EDT ----- ?Increase K by 81meq daily (take 2 on first day only) recheck 2 weeks ?

## 2021-12-20 NOTE — Telephone Encounter (Signed)
Patient advised and verbalized understanding,lab appointment scheduled,lab orders entered, new Rx sent into patients pharmacy.  ? ?Meds ordered this encounter  ?Medications  ? potassium chloride SA (KLOR-CON M20) 20 MEQ tablet  ?  Sig: Take 1 tablet (20 mEq total) by mouth daily.  ?  Dispense:  90 tablet  ?  Refill:  3  ? ?Orders Placed This Encounter  ?Procedures  ? Basic metabolic panel  ?  Standing Status:   Future  ?  Standing Expiration Date:   12/21/2022  ?  Order Specific Question:   Release to patient  ?  Answer:   Immediate  ? ? ?

## 2021-12-20 NOTE — Interval H&P Note (Signed)
History and Physical Interval Note: ? ?12/20/2021 ?11:40 AM ? ?[name]  has presented today for surgery, with the diagnosis of PAH.  The various methods of treatment have been discussed with the patient and family. After consideration of risks, benefits and other options for treatment, the patient has consented to  Procedure(s): ?RIGHT HEART CATH (N/A) as a surgical intervention.  The patient's history has been reviewed, patient examined, no change in status, stable for surgery.  I have reviewed the patient's chart and labs.  Questions were answered to the patient's satisfaction.   ? ? ?Danny Norris ? ? ?

## 2021-12-21 ENCOUNTER — Encounter (HOSPITAL_COMMUNITY): Payer: Self-pay | Admitting: Internal Medicine

## 2021-12-28 ENCOUNTER — Ambulatory Visit (HOSPITAL_COMMUNITY)
Admit: 2021-12-28 | Discharge: 2021-12-28 | Disposition: A | Discharge status: Home / Self Care | Payer: BC Managed Care – PPO | Attending: Internal Medicine | Admitting: Internal Medicine

## 2021-12-28 ENCOUNTER — Other Ambulatory Visit: Payer: Self-pay

## 2021-12-28 ENCOUNTER — Ambulatory Visit (HOSPITAL_COMMUNITY)
Admission: RE | Admit: 2021-12-28 | Discharge: 2021-12-28 | Disposition: A | Discharge status: Home / Self Care | Payer: BC Managed Care – PPO | Source: Ambulatory Visit | Attending: Internal Medicine | Admitting: Internal Medicine

## 2021-12-28 DIAGNOSIS — R0600 Dyspnea, unspecified: Secondary | ICD-10-CM | POA: Insufficient documentation

## 2021-12-28 DIAGNOSIS — J9811 Atelectasis: Secondary | ICD-10-CM | POA: Diagnosis not present

## 2021-12-28 DIAGNOSIS — I270 Primary pulmonary hypertension: Secondary | ICD-10-CM | POA: Diagnosis not present

## 2021-12-28 MED ORDER — TECHNETIUM TO 99M ALBUMIN AGGREGATED: 4.2000 | Freq: Once | INTRAVENOUS | Status: DC | PRN

## 2022-01-03 ENCOUNTER — Ambulatory Visit (INDEPENDENT_AMBULATORY_CARE_PROVIDER_SITE_OTHER): Payer: BC Managed Care – PPO | Admitting: Pulmonary Disease

## 2022-01-03 ENCOUNTER — Encounter: Payer: Self-pay | Admitting: Pulmonary Disease

## 2022-01-03 VITALS — BP 118/70 | HR 65 | Ht 72.0 in | Wt 254.0 lb

## 2022-01-03 DIAGNOSIS — J4520 Mild intermittent asthma, uncomplicated: Secondary | ICD-10-CM | POA: Diagnosis not present

## 2022-01-03 DIAGNOSIS — J9600 Acute respiratory failure, unspecified whether with hypoxia or hypercapnia: Secondary | ICD-10-CM | POA: Diagnosis not present

## 2022-01-03 DIAGNOSIS — I272 Pulmonary hypertension, unspecified: Secondary | ICD-10-CM | POA: Diagnosis not present

## 2022-01-03 DIAGNOSIS — J986 Disorders of diaphragm: Secondary | ICD-10-CM

## 2022-01-03 LAB — PULMONARY FUNCTION TEST
DL/VA % pred: 148 %
DL/VA: 6.23 ml/min/mmHg/L
DLCO cor % pred: 82 %
DLCO cor: 24.19 ml/min/mmHg
DLCO unc % pred: 88 %
DLCO unc: 25.96 ml/min/mmHg
FEF 25-75 Post: 3.13 L/sec
FEF 25-75 Pre: 2.02 L/sec
FEF2575-%Change-Post: 54 %
FEF2575-%Pred-Post: 98 %
FEF2575-%Pred-Pre: 63 %
FEV1-%Change-Post: 12 %
FEV1-%Pred-Post: 53 %
FEV1-%Pred-Pre: 46 %
FEV1-Post: 2.05 L
FEV1-Pre: 1.82 L
FEV1FVC-%Change-Post: 0 %
FEV1FVC-%Pred-Pre: 112 %
FEV6-%Change-Post: 12 %
FEV6-%Pred-Post: 49 %
FEV6-%Pred-Pre: 43 %
FEV6-Post: 2.4 L
FEV6-Pre: 2.13 L
FEV6FVC-%Pred-Post: 104 %
FEV6FVC-%Pred-Pre: 104 %
FVC-%Change-Post: 12 %
FVC-%Pred-Post: 46 %
FVC-%Pred-Pre: 41 %
FVC-Post: 2.4 L
FVC-Pre: 2.13 L
Post FEV1/FVC ratio: 86 %
Post FEV6/FVC ratio: 100 %
Pre FEV1/FVC ratio: 85 %
Pre FEV6/FVC Ratio: 100 %
RV % pred: 127 %
RV: 3.01 L
TLC % pred: 74 %
TLC: 5.5 L

## 2022-01-03 MED ORDER — ADVAIR HFA 115-21 MCG/ACT IN AERO: 2.0000 | INHALATION_SPRAY | Freq: Two times a day (BID) | RESPIRATORY_TRACT | 12 refills | Status: DC

## 2022-01-03 NOTE — Progress Notes (Signed)
Full PFT performed today. °

## 2022-01-03 NOTE — Progress Notes (Signed)
? ?Synopsis: Referred in January 2023 for respiratory failure ? ?Subjective:  ? ?PATIENT ID: Danny Norris GENDER: male DOB: 04-15-1961, MRN: [id number] ? ? ?HPI ? ?Chief Complaint  ?Patient presents with  ? Follow-up  ?  2 mo f/u after PFT. States he has been doing well since last visit.   ? ?Danny Norris is a 61 year old male, never smoker with history of hypertension, mitral valve regurgitation due to MV prolapse, pulmonary nodule, elevated right hemidiaphragm and DVT who returns to pulmonary clinic for chronic hypoxemic respiratory failure.  ? ?He had RHC on 12/20/21 which showed mean PA pressure 24, PCW 11, PVR 2.1 WU noting mild pulmonary hypertension and slight step up in right heart pressures. It was recommended to obtain an echo with bubble study to evaluate for shunt based on step up in pressures. ? ?He reports increased wheezing and cough along with nasal congestion due to spring allergies.  ? ?PFTs today show mild restrictive defect and significant bronchodilator response. ? ?OV 10/11/21 ?He will be completing pulmonary rehab tomorrow and reports he continues to use supplemental oxygen 2L with exercise while at rehab. Otherwise he is not using supplemental oxygen. He is using CPAP therapy at night.  ? ?NM PET Scan for further workup of the left lower lobe lobe subpleural nodule noted on 03/08/21 CT Chest scan had resolved. No other concerning findings on the scan that was suspicious of hypermetabolic activity.  ? ?He remains on eliquis for DVT treatment. ? ?He has been doing well since last visits with Kandice Robinsons, NP and Dr. Tonia Brooms. ? ?OV 03/17/21 with Dr. Tonia Brooms ?This is a 61 year old gentleman, history of dyslipidemia, hypertension, mitral valve regurgitation with a recent hospitalization.Patient was admitted to the hospital by critical care services.  Found to have acute hypoxemic respiratory failure, viral prodrome type symptoms, pulmonary opacities negative for COVID.  Was treated for community-acquired  pneumonia RVP was negative.  Unable to get CTA due to AKI.  Patient had bilateral lower extremity duplex completed on 02/20/2021 which revealed acute DVTs in the right and left peroneal veins and left posterior tibial deep veins.  Patient was started on anticoagulation and discharged on Eliquis.  Discharge summary from Dr. Dartha Lodge was reviewed.  Also during hospitalization had noticed a right elevated hemidiaphragm felt to be chronic.  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 ? ?Past Medical History:  ?Diagnosis Date  ? Dyslipidemia   ? HTN (hypertension)   ? Mitral valve regurgitation   ? a. 05/2011: TEE showing significant MVP especially involving the P2 segment without evidence for ruptured chordae and had eccentric MR anteriorly directed. b. 03/2015: echo showing EF 65-70% with posterior MV leaflet thickening and prolapse with mild MR.  ? Normal coronary arteries   ? a. normal cors by cath in 08/2011  ?  ? ?Family History  ?Problem Relation Age of Onset  ? Sleep apnea Brother   ? Congestive Heart Failure Mother   ?  ? ?Social History  ? ?Socioeconomic History  ? Marital status: Single  ?  Spouse name: Not on file  ? Number of children: Not on file  ? Years of education: Not on file  ? Highest education level: Not on file  ?Occupational History  ? Not on file  ?Tobacco Use  ? Smoking status: Never  ? Smokeless tobacco: Never  ?Substance and Sexual Activity  ? Alcohol use: No  ?  Alcohol/week: 0.0 standard drinks  ? Drug  use: Not on file  ? Sexual activity: Not on file  ?Other Topics Concern  ? Not on file  ?Social History Narrative  ? Not on file  ? ?Social Determinants of Health  ? ?Financial Resource Strain: Not on file  ?Food Insecurity: Not on file  ?Transportation Needs: Not on file  ?Physical Activity: Not on file  ?Stress: Not on file  ?Social Connections: Not on file  ?Intimate Partner Violence: Not on file  ?  ? ?Allergies  ?Allergen Reactions  ? Contrast Media  [Iodinated Contrast Media] Rash  ?  ? ?Outpatient Medications Prior to Visit  ?Medication Sig Dispense Refill  ? apixaban (ELIQUIS) 5 MG TABS tablet Take 5 mg by mouth 2 (two) times daily.    ? atorvastatin (LIPITOR) 20 MG tablet Take 1 tablet (20 mg total) by mouth daily at 6 PM. 30 tablet 6  ? hydrochlorothiazide (HYDRODIURIL) 25 MG tablet TAKE 1 TABLET BY MOUTH EVERY DAY 90 tablet 1  ? melatonin 5 MG TABS Take 5 mg by mouth at bedtime as needed (sleep).    ? metoprolol succinate (TOPROL-XL) 50 MG 24 hr tablet TAKE 1 TABLET BY MOUTH DAILY WITH OR IMMEDIATELY FOLLOWING A MEAL 90 tablet 1  ? potassium chloride SA (KLOR-CON M) 20 MEQ tablet Take 1 tablet (20 mEq total) by mouth daily. 90 tablet 3  ? amLODipine (NORVASC) 5 MG tablet Take 5 mg by mouth daily.    ? potassium chloride SA (KLOR-CON M20) 20 MEQ tablet Take 1 tablet (20 mEq total) by mouth daily. 90 tablet 3  ? ?No facility-administered medications prior to visit.  ? ? ?Review of Systems  ?Constitutional:  Negative for chills, fever, malaise/fatigue and weight loss.  ?HENT:  Positive for congestion. Negative for sinus pain and sore throat.   ?Eyes: Negative.   ?Respiratory:  Positive for cough and wheezing. Negative for hemoptysis, sputum production and shortness of breath.   ?Cardiovascular:  Negative for chest pain, palpitations, orthopnea, claudication and leg swelling.  ?Gastrointestinal:  Negative for abdominal pain, heartburn, nausea and vomiting.  ?Genitourinary: Negative.   ?Musculoskeletal:  Negative for joint pain and myalgias.  ?Skin:  Negative for rash.  ?Neurological:  Negative for weakness.  ?Endo/Heme/Allergies: Negative.   ?Psychiatric/Behavioral: Negative.    ? ? ? ?Objective:  ? ?Vitals:  ? 01/03/22 1104  ?BP: 118/70  ?Pulse: 65  ?SpO2: 93%  ?Weight: 254 lb (115.2 kg)  ?Height: 6' (1.829 m)  ? ?Physical Exam ?Constitutional:   ?   General: He is not in acute distress. ?HENT:  ?   Head: Normocephalic and atraumatic.  ?Eyes:  ?    Conjunctiva/sclera: Conjunctivae normal.  ?Cardiovascular:  ?   Rate and Rhythm: Normal rate and regular rhythm.  ?   Pulses: Normal pulses.  ?   Heart sounds: Normal heart sounds. No murmur heard. ?Pulmonary:  ?   Effort: Pulmonary effort is normal.  ?   Breath sounds: Normal breath sounds.  ?Musculoskeletal:  ?   Right lower leg: No edema.  ?   Left lower leg: No edema.  ?Skin: ?   General: Skin is warm and dry.  ?Neurological:  ?   General: No focal deficit present.  ?   Mental Status: He is alert.  ?Psychiatric:     ?   Mood and Affect: Mood normal.     ?   Behavior: Behavior normal.     ?   Thought Content: Thought content normal.     ?  Judgment: Judgment normal.  ? ? ?CBC ?   ?Component Value Date/Time  ? WBC 6.0 12/13/2021 1309  ? RBC 5.56 12/13/2021 1309  ? HGB 16.7 12/20/2021 1434  ? HGB 15.5 08/08/2019 0813  ? HCT 49.0 12/20/2021 1434  ? HCT 47.1 08/08/2019 0813  ? PLT 171 12/13/2021 1309  ? PLT 205 08/08/2019 0813  ? MCV 94.2 12/13/2021 1309  ? MCV 91 08/08/2019 0813  ? MCH 31.5 12/13/2021 1309  ? MCHC 33.4 12/13/2021 1309  ? RDW 13.8 12/13/2021 1309  ? RDW 15.9 (H) 08/08/2019 0813  ? LYMPHSABS 1.4 02/19/2021 1031  ? MONOABS 0.6 02/19/2021 1031  ? EOSABS 0.0 02/19/2021 1031  ? BASOSABS 0.0 02/19/2021 1031  ? ? ?  Latest Ref Rng & Units 12/20/2021  ?  2:34 PM 12/20/2021  ?  2:30 PM 12/20/2021  ? 11:21 AM  ?BMP  ?Glucose 70 - 99 mg/dL   893    ?BUN 6 - 20 mg/dL   7    ?Creatinine 0.61 - 1.24 mg/dL   8.10    ?Sodium 135 - 145 mmol/L 141   140    ? 150   140    ?Potassium 3.5 - 5.1 mmol/L 3.4   3.5    ? 2.3   3.6    ?Chloride 98 - 111 mmol/L   103    ?CO2 22 - 32 mmol/L   29    ?Calcium 8.9 - 10.3 mg/dL   17.5    ? ?Chest imaging: ?NM PET Scan 04/22/21 ?1. The previously demonstrated subpleural density posteromedially in ?the left lower lobe has resolved, consistent with resolved ?atelectasis or inflammation. No evidence of thoracic malignancy. ?2. No suspicious hypermetabolic activity within the neck,  chest, ?abdomen or pelvis. ?3. Chronic right hemidiaphragm elevation and right basilar ?atelectasis. Cholelithiasis. Aortic Atherosclerosis ? ?CT Chest 03/08/21 ?1. There is a subpleural density within the posterior medial l

## 2022-01-03 NOTE — Patient Instructions (Signed)
Full PFT performed today. °

## 2022-01-03 NOTE — Patient Instructions (Signed)
Your breathing tests show mild restriction and significant response to albuterol inhaler. ? ?Start advair inhaler 2 puffs twice daily ?- rinse mouth out after each use ? ?Follow up in 2 months to see how inhaler therapy is going either in person or via video visit ?

## 2022-01-04 ENCOUNTER — Ambulatory Visit (HOSPITAL_COMMUNITY)
Admission: RE | Admit: 2022-01-04 | Discharge: 2022-01-04 | Disposition: A | Discharge status: Home / Self Care | Payer: BC Managed Care – PPO | Source: Ambulatory Visit | Attending: Cardiology | Admitting: Cardiology

## 2022-01-04 DIAGNOSIS — Z8639 Personal history of other endocrine, nutritional and metabolic disease: Secondary | ICD-10-CM | POA: Insufficient documentation

## 2022-01-04 LAB — BASIC METABOLIC PANEL
Anion gap: 5 (ref 5–15)
BUN: 9 mg/dL (ref 6–20)
CO2: 35 mmol/L — ABNORMAL HIGH (ref 22–32)
Calcium: 10.2 mg/dL (ref 8.9–10.3)
Chloride: 103 mmol/L (ref 98–111)
Creatinine, Ser: 1.01 mg/dL (ref 0.61–1.24)
GFR, Estimated: >60 mL/min (ref >60)
Glucose, Bld: 119 mg/dL — ABNORMAL HIGH (ref 70–99)
Potassium: 4.1 mmol/L (ref 3.5–5.1)
Sodium: 143 mmol/L (ref 135–145)

## 2022-01-13 ENCOUNTER — Telehealth: Payer: Self-pay | Admitting: Pulmonary Disease

## 2022-01-13 NOTE — Telephone Encounter (Signed)
Called and left voicemail for patient to call office back in regards to Advair inhaler.  ?

## 2022-01-17 DIAGNOSIS — J9691 Respiratory failure, unspecified with hypoxia: Secondary | ICD-10-CM | POA: Diagnosis not present

## 2022-02-16 DIAGNOSIS — J9691 Respiratory failure, unspecified with hypoxia: Secondary | ICD-10-CM | POA: Diagnosis not present

## 2022-02-17 NOTE — Telephone Encounter (Signed)
Called pt and spoke with spouse Marylene Land about pt's Advair inhaler. Marylene Land said that the cost of the Advair HFA was $108 and she said if they had given pt the generic, it was going to be even more than that.  Marylene Land wanted to know if there might be something else that would be cheaper than what they are paying for the Advair HFA that would be equivalent to it that pt might be able to try instead.  Routing to both Dr. Francine Graven and prior [facility] team. Please advise.

## 2022-02-18 ENCOUNTER — Other Ambulatory Visit (HOSPITAL_COMMUNITY): Payer: Self-pay

## 2022-02-18 NOTE — Telephone Encounter (Signed)
Unfortunately, I'm unable to test bill for this patient as their insurance only covers prescriptions filled with [facility].

## 2022-02-21 NOTE — Telephone Encounter (Signed)
Danny Sinner, MD  [facility] Triage Pool 2 days ago   Please let patient know the following and if they would like to switch pharmacies. Advair diskus would probably be the only cheaper alternative.   JD     Called and spoke with pt letting him know the info per JD and he stated he would stick with the Advair HFA for now. Stated to him if the cost did get to be too much and he decided that he wanted to switch to call us and let us know and he verbalized understanding. Nothing further needed.

## 2022-02-22 DIAGNOSIS — G4733 Obstructive sleep apnea (adult) (pediatric): Secondary | ICD-10-CM | POA: Diagnosis not present

## 2022-03-07 ENCOUNTER — Encounter: Payer: Self-pay | Admitting: Pulmonary Disease

## 2022-03-07 ENCOUNTER — Ambulatory Visit (INDEPENDENT_AMBULATORY_CARE_PROVIDER_SITE_OTHER): Payer: BC Managed Care – PPO | Admitting: Pulmonary Disease

## 2022-03-07 VITALS — BP 128/82 | HR 87 | Ht 72.0 in | Wt 255.8 lb

## 2022-03-07 DIAGNOSIS — J4520 Mild intermittent asthma, uncomplicated: Secondary | ICD-10-CM

## 2022-03-07 DIAGNOSIS — I272 Pulmonary hypertension, unspecified: Secondary | ICD-10-CM | POA: Diagnosis not present

## 2022-03-07 NOTE — Patient Instructions (Addendum)
Continue advair inhaler 2 puffs twice daily - rinse mouth out after each use   You can try weaning off advair in 1-2 months. Reduce to 1 puff twice daily and then as needed after 2-4 weeks if no increase in symptoms.  Follow up in 1 year

## 2022-03-07 NOTE — Progress Notes (Signed)
Synopsis: Referred in January 2023 for respiratory failure  Subjective:   PATIENT ID: Danny Norris GENDER: male DOB: [birth year], MRN: [id number]   HPI  Chief Complaint  Patient presents with   Follow-up    2 mo f/u after starting Advair.    Danny Norris is a 61 year old male, never smoker with history of hypertension, mitral valve regurgitation due to MV prolapse, pulmonary nodule, elevated right hemidiaphragm and DVT who returns to pulmonary clinic for chronic hypoxemic respiratory failure.   He was started on advair HFA 115-34mcg 2 puffs twice daily after last visit with improvement of his cough and wheezing.   He has on going seasonal allergy symptoms with sneezing.   OV 01/03/22 He had RHC on 12/20/21 which showed mean PA pressure 24, PCW 11, PVR 2.1 WU noting mild pulmonary hypertension and slight step up in right heart pressures. It was recommended to obtain an echo with bubble study to evaluate for shunt based on step up in pressures.  He reports increased wheezing and cough along with nasal congestion due to spring allergies.   PFTs today show mild restrictive defect and significant bronchodilator response.  OV 10/11/21 He will be completing pulmonary rehab tomorrow and reports he continues to use supplemental oxygen 2L with exercise while at rehab. Otherwise he is not using supplemental oxygen. He is using CPAP therapy at night.   NM PET Scan for further workup of the left lower lobe lobe subpleural nodule noted on 03/08/21 CT Chest scan had resolved. No other concerning findings on the scan that was suspicious of hypermetabolic activity.   He remains on eliquis for DVT treatment.  He has been doing well since last visits with Eric Form, NP and Dr. Valeta Harms.  OV 03/17/21 with Dr. Valeta Harms This is a 61 year old gentleman, history of dyslipidemia, hypertension, mitral valve regurgitation with a recent hospitalization.Patient was admitted to the hospital by critical care services.   Found to have acute hypoxemic respiratory failure, viral prodrome type symptoms, pulmonary opacities negative for COVID.  Was treated for community-acquired pneumonia RVP was negative.  Unable to get CTA due to AKI.  Patient had bilateral lower extremity duplex completed on 02/20/2021 which revealed acute DVTs in the right and left peroneal veins and left posterior tibial deep veins.  Patient was started on anticoagulation and discharged on Eliquis.  Discharge summary from Dr. Marthenia Rolling was reviewed.  Also during hospitalization had noticed a right elevated hemidiaphragm felt to be chronic.  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  Past Medical History:  Diagnosis Date   Dyslipidemia    HTN (hypertension)    Mitral valve regurgitation    a. 05/2011: TEE showing significant MVP especially involving the P2 segment without evidence for ruptured chordae and had eccentric MR anteriorly directed. b. 03/2015: echo showing EF 65-70% with posterior MV leaflet thickening and prolapse with mild MR.   Normal coronary arteries    a. normal cors by cath in 08/2011     Family History  Problem Relation Age of Onset   Sleep apnea Brother    Congestive Heart Failure Mother      Social History   Socioeconomic History   Marital status: Single    Spouse name: Not on file   Number of children: Not on file   Years of education: Not on file   Highest education level: Not on file  Occupational History   Not on file  Tobacco Use  Smoking status: Never   Smokeless tobacco: Never  Substance and Sexual Activity   Alcohol use: No    Alcohol/week: 0.0 standard drinks   Drug use: Not on file   Sexual activity: Not on file  Other Topics Concern   Not on file  Social History Narrative   Not on file   Social Determinants of Health   Financial Resource Strain: Not on file  Food Insecurity: Not on file  Transportation Needs: Not on file  Physical Activity: Not on  file  Stress: Not on file  Social Connections: Not on file  Intimate Partner Violence: Not on file     Allergies  Allergen Reactions   Contrast Media [Iodinated Contrast Media] Rash     Outpatient Medications Prior to Visit  Medication Sig Dispense Refill   amLODipine (NORVASC) 5 MG tablet Take 5 mg by mouth daily.     apixaban (ELIQUIS) 5 MG TABS tablet Take 5 mg by mouth 2 (two) times daily.     atorvastatin (LIPITOR) 20 MG tablet Take 1 tablet (20 mg total) by mouth daily at 6 PM. 30 tablet 6   fluticasone-salmeterol (ADVAIR HFA) 115-21 MCG/ACT inhaler Inhale 2 puffs into the lungs 2 (two) times daily. 1 each 12   hydrochlorothiazide (HYDRODIURIL) 25 MG tablet TAKE 1 TABLET BY MOUTH EVERY DAY 90 tablet 1   melatonin 5 MG TABS Take 5 mg by mouth at bedtime as needed (sleep).     metoprolol succinate (TOPROL-XL) 50 MG 24 hr tablet TAKE 1 TABLET BY MOUTH DAILY WITH OR IMMEDIATELY FOLLOWING A MEAL 90 tablet 1   potassium chloride SA (KLOR-CON M) 20 MEQ tablet Take 1 tablet (20 mEq total) by mouth daily. 90 tablet 3   No facility-administered medications prior to visit.    Review of Systems  Constitutional:  Negative for chills, fever, malaise/fatigue and weight loss.  HENT:  Negative for congestion, sinus pain and sore throat.   Eyes: Negative.   Respiratory:  Negative for cough, hemoptysis, sputum production, shortness of breath and wheezing.   Cardiovascular:  Negative for chest pain, palpitations, orthopnea, claudication and leg swelling.  Gastrointestinal:  Negative for abdominal pain, heartburn, nausea and vomiting.  Genitourinary: Negative.   Musculoskeletal:  Negative for joint pain and myalgias.  Skin:  Negative for rash.  Neurological:  Negative for weakness.  Endo/Heme/Allergies:  Positive for environmental allergies.  Psychiatric/Behavioral: Negative.       Objective:   Vitals:   03/07/22 1055  BP: 128/82  Pulse: 87  SpO2: 94%  Weight: 255 lb 12.8 oz (116 kg)   Height: 6' (1.829 m)   Physical Exam Constitutional:      General: He is not in acute distress. HENT:     Head: Normocephalic and atraumatic.  Eyes:     Conjunctiva/sclera: Conjunctivae normal.  Cardiovascular:     Rate and Rhythm: Normal rate and regular rhythm.     Pulses: Normal pulses.     Heart sounds: Normal heart sounds. No murmur heard. Pulmonary:     Effort: Pulmonary effort is normal.     Breath sounds: Normal breath sounds.  Musculoskeletal:     Right lower leg: No edema.     Left lower leg: No edema.  Skin:    General: Skin is warm and dry.  Neurological:     General: No focal deficit present.     Mental Status: He is alert.  Psychiatric:        Mood and Affect: Mood  normal.        Behavior: Behavior normal.        Thought Content: Thought content normal.        Judgment: Judgment normal.    CBC    Component Value Date/Time   WBC 6.0 12/13/2021 1309   RBC 5.56 12/13/2021 1309   HGB 16.7 12/20/2021 1434   HGB 15.5 08/08/2019 0813   HCT 49.0 12/20/2021 1434   HCT 47.1 08/08/2019 0813   PLT 171 12/13/2021 1309   PLT 205 08/08/2019 0813   MCV 94.2 12/13/2021 1309   MCV 91 08/08/2019 0813   MCH 31.5 12/13/2021 1309   MCHC 33.4 12/13/2021 1309   RDW 13.8 12/13/2021 1309   RDW 15.9 (H) 08/08/2019 0813   LYMPHSABS 1.4 02/19/2021 1031   MONOABS 0.6 02/19/2021 1031   EOSABS 0.0 02/19/2021 1031   BASOSABS 0.0 02/19/2021 1031      Latest Ref Rng & Units 01/04/2022    9:03 AM 12/20/2021    2:34 PM 12/20/2021    2:30 PM  BMP  Glucose 70 - 99 mg/dL 119      BUN 6 - 20 mg/dL 9      Creatinine 0.61 - 1.24 mg/dL 1.01      Sodium 135 - 145 mmol/L 143   141   140     150    Potassium 3.5 - 5.1 mmol/L 4.1   3.4   3.5     2.3    Chloride 98 - 111 mmol/L 103      CO2 22 - 32 mmol/L 35      Calcium 8.9 - 10.3 mg/dL 10.2       Chest imaging: NM PET Scan 04/22/21 1. The previously demonstrated subpleural density posteromedially in the left lower lobe has  resolved, consistent with resolved atelectasis or inflammation. No evidence of thoracic malignancy. 2. No suspicious hypermetabolic activity within the neck, chest, abdomen or pelvis. 3. Chronic right hemidiaphragm elevation and right basilar atelectasis. Cholelithiasis. Aortic Atherosclerosis  CT Chest 03/08/21 1. There is a subpleural density within the posterior medial left lower lobe measuring up to 2.9 cm. Differential considerations include consolidation due to pneumonia versus underlying malignancy. Consider one of the following in 3 months for both low-risk and high-risk individuals: (a) repeat chest CT, (b) follow-up PET-CT, or (c) tissue sampling. This recommendation follows the consensus statement: Guidelines for Management of Incidental Pulmonary Nodules Detected on CT Images: From the Fleischner Society 2017; Radiology 2017; 284:228-243. 2. Marked asymmetric elevation of the right hemidiaphragm with partial atelectasis of the right middle lobe and right lower lobe. 3. Aortic atherosclerosis. 4. Gallstones.   PFT:    Latest Ref Rng & Units 01/03/2022    9:48 AM  PFT Results  FVC-Pre L 2.13    FVC-Predicted Pre % 41    FVC-Post L 2.40    FVC-Predicted Post % 46    Pre FEV1/FVC % % 85    Post FEV1/FCV % % 86    FEV1-Pre L 1.82    FEV1-Predicted Pre % 46    FEV1-Post L 2.05    DLCO uncorrected ml/min/mmHg 25.96    DLCO UNC% % 88    DLCO corrected ml/min/mmHg 24.19    DLCO COR %Predicted % 82    DLVA Predicted % 148    TLC L 5.50    TLC % Predicted % 74    RV % Predicted % 127    01/2022: Mild restriction and significant bronchodilator response.  Labs:  Path:  Echo 02/20/21: LV EF 60-65%. RV size and systolic function are normal. RVSP 25.110mmHg. Prolapse of the posterior MV leaflet with eccentric anterior MR difficult to quantify. Mild to moderate mitral valve regurgitation. LA and RA normal size.  Heart Catheterization:  Vas [place] Lower Extremity 02/20/21 Acute  DVT of right and left peroneal veins     Assessment & Plan:   Mild intermittent reactive airway disease without complication  Mild pulmonary hypertension (HCC)  Discussion: Amier Diedrich is a 61 year old male, never smoker with history of hypertension, mitral valve regurgitation due to MV prolapse, pulmonary nodule, elevated right hemidiaphragm and DVT who returns to pulmonary clinic for chronic hypoxemic respiratory failure.   Patient continues to experience exertional SpO2 desaturations with ambulation as evidenced on 6 MWT at pulmonary rehab on room air. He has received a portable oxygen concentrator.   RHC shows evidence of mild pulmonary hypertension in setting of valvular disease vs known OSA.  His PFTs show mild restriction which could be attribted to the elevated right hemidiaphragm. There is significant bronchodilator response. He has benefited from Advair hfa 115-91mcg 2 puffs twice daily. He is to continue this inhaler over the next 1-2 months and try to self wean and monitor for any return of symptoms. If symptoms he return, he is to continue inhaler. We will give him a spacer today.  Continue CPAP therapy for OSA.   Follow up in 1 year.   Freda Jackson, MD [facility] Office: [phone]   Current Outpatient Medications:    amLODipine (NORVASC) 5 MG tablet, Take 5 mg by mouth daily., Disp: , Rfl:    apixaban (ELIQUIS) 5 MG TABS tablet, Take 5 mg by mouth 2 (two) times daily., Disp: , Rfl:    atorvastatin (LIPITOR) 20 MG tablet, Take 1 tablet (20 mg total) by mouth daily at 6 PM., Disp: 30 tablet, Rfl: 6   fluticasone-salmeterol (ADVAIR HFA) 115-21 MCG/ACT inhaler, Inhale 2 puffs into the lungs 2 (two) times daily., Disp: 1 each, Rfl: 12   hydrochlorothiazide (HYDRODIURIL) 25 MG tablet, TAKE 1 TABLET BY MOUTH EVERY DAY, Disp: 90 tablet, Rfl: 1   melatonin 5 MG TABS, Take 5 mg by mouth at bedtime as needed (sleep)., Disp: , Rfl:    metoprolol  succinate (TOPROL-XL) 50 MG 24 hr tablet, TAKE 1 TABLET BY MOUTH DAILY WITH OR IMMEDIATELY FOLLOWING A MEAL, Disp: 90 tablet, Rfl: 1   potassium chloride SA (KLOR-CON M) 20 MEQ tablet, Take 1 tablet (20 mEq total) by mouth daily., Disp: 90 tablet, Rfl: 3

## 2022-03-19 DIAGNOSIS — J9691 Respiratory failure, unspecified with hypoxia: Secondary | ICD-10-CM | POA: Diagnosis not present

## 2022-03-21 ENCOUNTER — Encounter (HOSPITAL_COMMUNITY): Payer: Self-pay | Admitting: Internal Medicine

## 2022-03-21 ENCOUNTER — Ambulatory Visit (HOSPITAL_COMMUNITY)
Admission: RE | Admit: 2022-03-21 | Discharge: 2022-03-21 | Disposition: A | Discharge status: Home / Self Care | Payer: BC Managed Care – PPO | Source: Ambulatory Visit | Attending: Internal Medicine | Admitting: Internal Medicine

## 2022-03-21 VITALS — BP 140/80 | HR 75 | Wt 254.8 lb

## 2022-03-21 DIAGNOSIS — I340 Nonrheumatic mitral (valve) insufficiency: Secondary | ICD-10-CM | POA: Insufficient documentation

## 2022-03-21 DIAGNOSIS — Z8249 Family history of ischemic heart disease and other diseases of the circulatory system: Secondary | ICD-10-CM | POA: Diagnosis not present

## 2022-03-21 DIAGNOSIS — I2721 Secondary pulmonary arterial hypertension: Secondary | ICD-10-CM

## 2022-03-21 DIAGNOSIS — I351 Nonrheumatic aortic (valve) insufficiency: Secondary | ICD-10-CM | POA: Diagnosis not present

## 2022-03-21 DIAGNOSIS — Z86718 Personal history of other venous thrombosis and embolism: Secondary | ICD-10-CM | POA: Diagnosis not present

## 2022-03-21 DIAGNOSIS — I110 Hypertensive heart disease with heart failure: Secondary | ICD-10-CM | POA: Diagnosis not present

## 2022-03-21 DIAGNOSIS — G4733 Obstructive sleep apnea (adult) (pediatric): Secondary | ICD-10-CM | POA: Diagnosis not present

## 2022-03-21 DIAGNOSIS — I272 Pulmonary hypertension, unspecified: Secondary | ICD-10-CM | POA: Insufficient documentation

## 2022-03-21 DIAGNOSIS — I341 Nonrheumatic mitral (valve) prolapse: Secondary | ICD-10-CM | POA: Diagnosis not present

## 2022-03-21 DIAGNOSIS — I10 Essential (primary) hypertension: Secondary | ICD-10-CM

## 2022-03-21 DIAGNOSIS — J9611 Chronic respiratory failure with hypoxia: Secondary | ICD-10-CM | POA: Insufficient documentation

## 2022-03-21 DIAGNOSIS — I509 Heart failure, unspecified: Secondary | ICD-10-CM | POA: Diagnosis not present

## 2022-03-21 NOTE — Patient Instructions (Signed)
There has been no changes to your medications.  You have been referred back to Dr. Tresa Endo. His office should call you with an appointment.  Your physician recommends that you schedule a follow-up appointment in: as needed.  If you have any questions or concerns before your next appointment please send [place] a message through [facility] or call our office at [phone].    TO LEAVE A MESSAGE FOR THE NURSE SELECT OPTION 2, PLEASE LEAVE A MESSAGE INCLUDING: YOUR NAME DATE OF BIRTH CALL BACK NUMBER REASON FOR CALL**this is important as we prioritize the call backs  YOU WILL RECEIVE A CALL BACK THE SAME DAY AS LONG AS YOU CALL BEFORE 4:00 PM  At the [facility], you and your health needs are our priority. As part of our continuing mission to provide you with exceptional heart care, we have created designated Provider Care Teams. These Care Teams include your primary Cardiologist (physician) and Advanced Practice Providers (APPs- Physician Assistants and Nurse Practitioners) who all work together to provide you with the care you need, when you need it.   You may see any of the following providers on your designated Care Team at your next follow up: Dr Arvilla Meres Dr Carron Curie, NP Robbie Lis, [place] [facility] [place], [place] Karle Plumber, PharmD   Please be sure to bring in all your medications bottles to every appointment.

## 2022-03-21 NOTE — Progress Notes (Signed)
[facility]  Referring Physician: Dr. Francine Graven  Primary Care: Deatra James, MD Primary Cardiologist:  Dr. Tresa Endo    HPI: Danny Norris is a 61 year old male, never smoker with OSA, chronic hypoxemic respiratory failure (uses O2 only with exercise),  HTN, mitral valve regurgitation due to MV prolapse, pulmonary nodule, elevated right hemidiaphragm and previous bilateral DVT (5/22)  who is referred by Dr. 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   Cath 11/12: No CAD PAP 28/12 PCWP 15  TEE 8/12 Normal EF significant MVP especially involving the P2 segment without evidence for ruptured chordae and had eccentric MR anteriorly directed.  TTE 03/2015: EF 65-70% with posterior MV leaflet thickening and prolapse with mild MR  ECHO 5/22 LV EF 60-65%. Mod LVH. RV size and systolic function are normal. RVSP 25.56mmHg. Prolapse of the posterior MV leaflet with eccentric anterior MR difficult to quantify. Mild to moderate mitral valve regurgitation. LA and RA normal size.   Says problems really started in 5/22. Admitted with acute respiratory failure. Intubated. Found to have bilateral DVT. Did not undergo CT with contrast due to contrast allergy. No VQ scan performed.   Today he returns for HF follow up with his wife. Overall feeling fine. Mild SOB with inclines. Denies PND/Orthopnea. Able to walk slowly around [facility]. Trying to get used to wearing CPAP at night. Appetite ok. No fever or chills. Weight at home 248-250 pounds. Taking all medications  VQ - low probability PE  RHC 12/20/21  RA = 2 RV = 33/7 PA = 36/19 (24) PCW = 11 Fick cardiac output/index = 5.9/3.1 PVR = 2.1 WU Ao sat = 93% PA sat = 71%, 76% SVC sat = 64%  Assessment: 1. Very mild PAH with normal LV filling pressures 2. Very slight step-up in right heart pressures.    Past Medical History:  Diagnosis Date   Dyslipidemia    HTN (hypertension)    Mitral valve regurgitation    a.  05/2011: TEE showing significant MVP especially involving the P2 segment without evidence for ruptured chordae and had eccentric MR anteriorly directed. b. 03/2015: echo showing EF 65-70% with posterior MV leaflet thickening and prolapse with mild MR.   Normal coronary arteries    a. normal cors by cath in 08/2011    Current Outpatient Medications  Medication Sig Dispense Refill   amLODipine (NORVASC) 5 MG tablet Take 5 mg by mouth daily.     apixaban (ELIQUIS) 5 MG TABS tablet Take 5 mg by mouth 2 (two) times daily.     atorvastatin (LIPITOR) 20 MG tablet Take 1 tablet (20 mg total) by mouth daily at 6 PM. 30 tablet 6   fluticasone-salmeterol (ADVAIR HFA) 115-21 MCG/ACT inhaler Inhale 2 puffs into the lungs 2 (two) times daily. 1 each 12   hydrochlorothiazide (HYDRODIURIL) 25 MG tablet TAKE 1 TABLET BY MOUTH EVERY DAY 90 tablet 1   melatonin 5 MG TABS Take 5 mg by mouth at bedtime as needed (sleep).     metoprolol succinate (TOPROL-XL) 50 MG 24 hr tablet TAKE 1 TABLET BY MOUTH DAILY WITH OR IMMEDIATELY FOLLOWING A MEAL 90 tablet 1   potassium chloride SA (KLOR-CON M) 20 MEQ tablet Take 1 tablet (20 mEq total) by mouth daily. 90 tablet 3   No current facility-administered medications for this encounter.    Allergies  Allergen Reactions   Contrast Media [Iodinated Contrast Media] Rash      Social History  Socioeconomic History   Marital status: Single    Spouse name: Not on file   Number of children: Not on file   Years of education: Not on file   Highest education level: Not on file  Occupational History   Not on file  Tobacco Use   Smoking status: Never   Smokeless tobacco: Never  Substance and Sexual Activity   Alcohol use: No    Alcohol/week: 0.0 standard drinks of alcohol   Drug use: Not on file   Sexual activity: Not on file  Other Topics Concern   Not on file  Social History Narrative   Not on file   Social Determinants of Health   Financial Resource Strain:  Not on file  Food Insecurity: Not on file  Transportation Needs: Not on file  Physical Activity: Not on file  Stress: Not on file  Social Connections: Not on file  Intimate Partner Violence: Not on file      Family History  Problem Relation Age of Onset   Sleep apnea Brother    Congestive Heart Failure Mother     Vitals:   03/21/22 1147  BP: 140/80  Pulse: 75  SpO2: 93%  Weight: 115.6 kg (254 lb 12.8 oz)   Wt Readings from Last 3 Encounters:  03/21/22 115.6 kg (254 lb 12.8 oz)  03/07/22 116 kg (255 lb 12.8 oz)  01/03/22 115.2 kg (254 lb)     PHYSICAL EXAM: General:  Well appearing. No resp difficulty HEENT: normal Neck: supple. no JVD. Carotids 2+ bilat; no bruits. No lymphadenopathy or thryomegaly appreciated. Cor: PMI nondisplaced. Regular rate & rhythm. No rubs, gallops. RUSB 1/6. Lungs: clear Abdomen: soft, nontender, nondistended. No hepatosplenomegaly. No bruits or masses. Good bowel sounds. Extremities: no cyanosis, clubbing, rash, edema Neuro: alert & orientedx3, cranial nerves grossly intact. moves all 4 extremities w/o difficulty. Affect pleasant rossly intact. moves all 4 extremities w/o difficulty. Affect pleasant.  ASSESSMENT & PLAN:   1. PAH  - appears mild by echo - suspect multifactorial  - VQ san negative for PE.  - RHC 12/2021 PVR 2.1  - suspect deconditioning and elevated R hemidiaphragm may be play a role in his dyspnea  2. Mitral regurgitation due to MVP - this is mild - doubt it has much effect on pulmonary pressures  3. 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 Chronic hypoxemic respiratory failure - follows with Dr. Erin Fulling - continue CPAP - Uses oxygen with exercise otherwise on room air.   5. OSA - continue CPAP  6. Morbid obesity - needs weight loss  Follow up as needed.   Darrick Grinder, NP  12:14 PM  Patient seen and examined with the above-signed Advanced Practice Provider and/or Housestaff. I personally reviewed  laboratory data, imaging studies and relevant notes. I independently examined the patient and formulated the important aspects of the plan. I have edited the note to reflect any of my changes or salient points. I have personally discussed the plan with the patient and/or family.  Doing well. Results of [place] reviewed with him. Very mild PAH.   General:  Well appearing. No resp difficulty HEENT: normal Neck: supple. no JVD. Carotids 2+ bilat; no bruits. No lymphadenopathy or thryomegaly appreciated. Cor: PMI nondisplaced. Regular rate & rhythm. 2/6 AS/AI Lungs: clear Abdomen: obese soft, nontender, nondistended. No hepatosplenomegaly. No bruits or masses. Good bowel sounds. Extremities: no cyanosis, clubbing, rash, edema Neuro: alert & orientedx3, cranial nerves grossly intact. moves all 4 extremities  w/o difficulty. Affect pleasant  PAH is mild by RHC. VQ negative.  No need for selective pulmonary vasodilators. Needs f/u of AoV with Dr. Tresa Endo. Continue CPAP.   Can f/u with [place] PRN.   Arvilla Meres, MD  12:49 PM

## 2022-04-13 IMAGING — DX DG CHEST 1V PORT
1 series · 1 of 1 positions shown · non-contrast
Comparison: 02/19/2021

CLINICAL DATA: Post intubation

EXAM:
PORTABLE CHEST 1 VIEW

[chest ap]
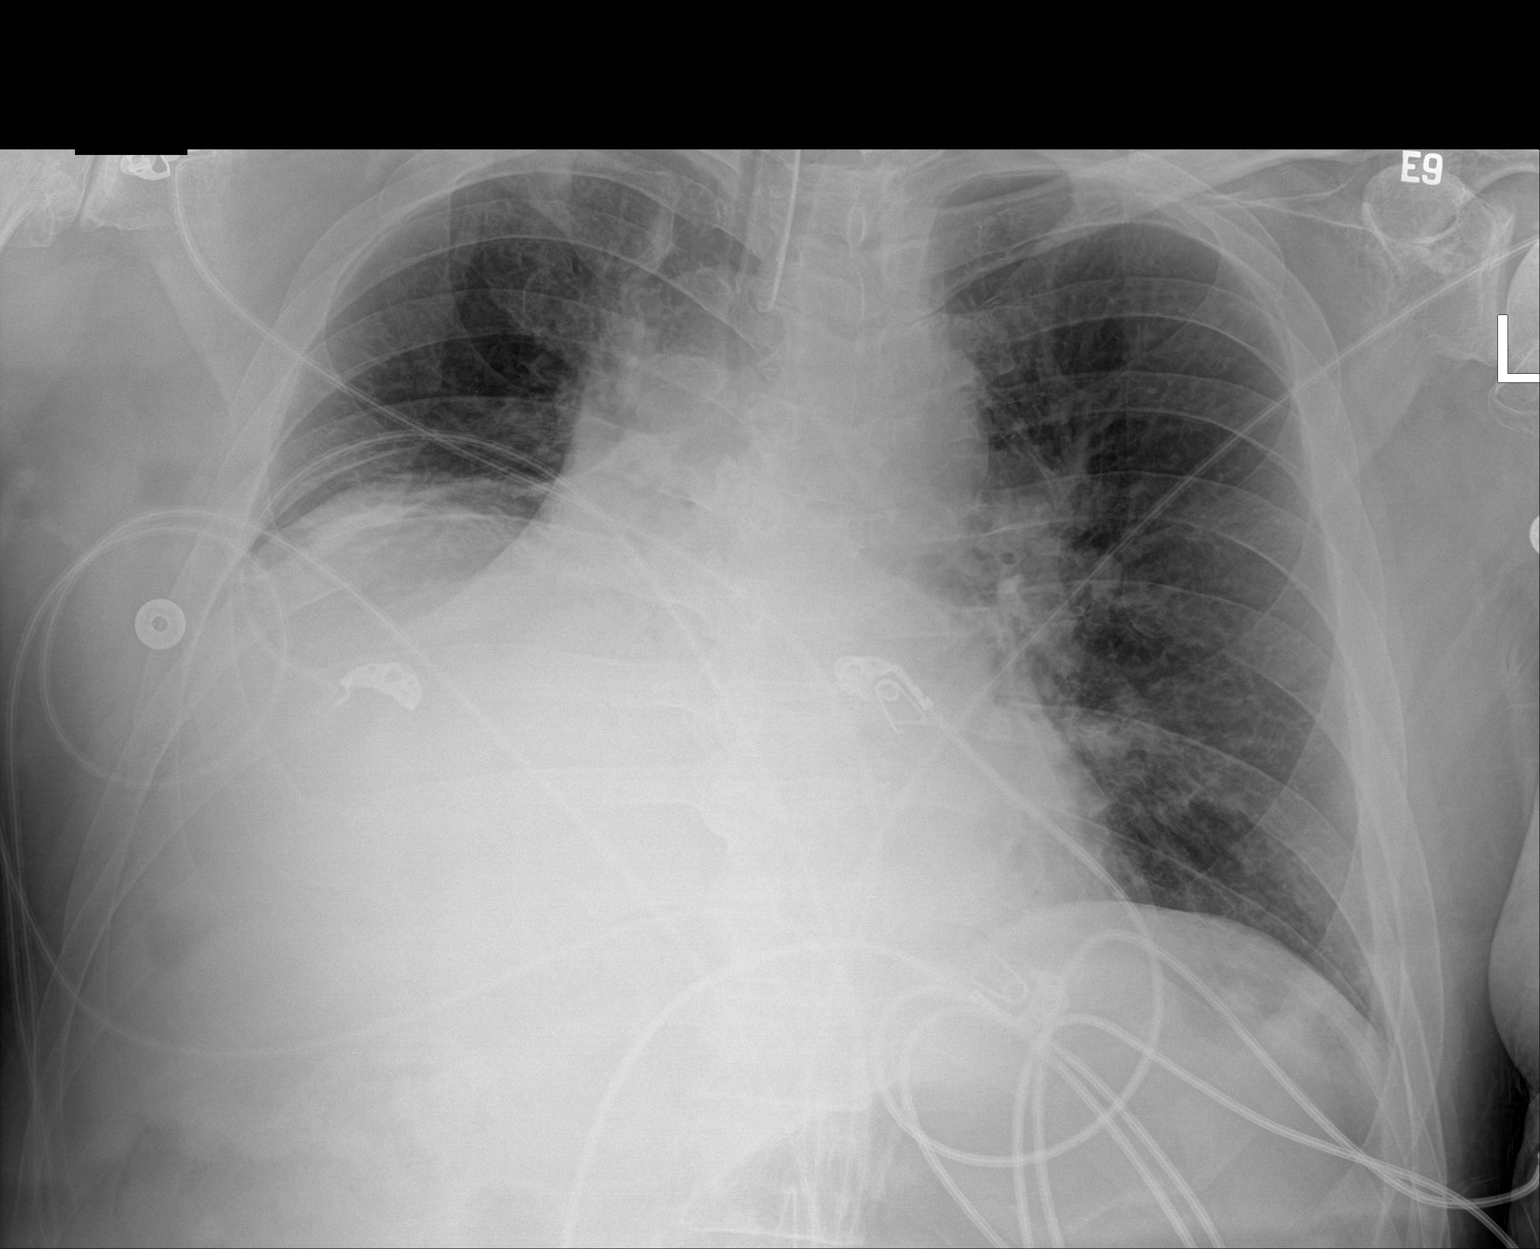

[1 of 1 positions shown; findings below may reference images not displayed]

FINDINGS: Endotracheal tube 3.4 cm above the carina. Elevated right
hemidiaphragm with right base atelectasis. Mild cardiomegaly.
Vascular congestion. Improving aeration on the left with decreasing
perihilar and lower lobe opacities.
IMPRESSION: Interval intubation. Improving left perihilar and lower lobe
airspace opacity.

Elevated right hemidiaphragm with right base atelectasis.

Cardiomegaly, vascular congestion.

## 2022-04-18 DIAGNOSIS — J9691 Respiratory failure, unspecified with hypoxia: Secondary | ICD-10-CM | POA: Diagnosis not present

## 2022-04-18 IMAGING — DX DG CHEST 1V PORT
1 series · 2 of 2 positions shown · non-contrast
Comparison: 02/20/2021

CLINICAL DATA: Hypoxia

EXAM:
PORTABLE CHEST 1 VIEW

[Series 1: chest ap · 0.14mm/px · 2 of 2 slices shown]
[im 1/2]
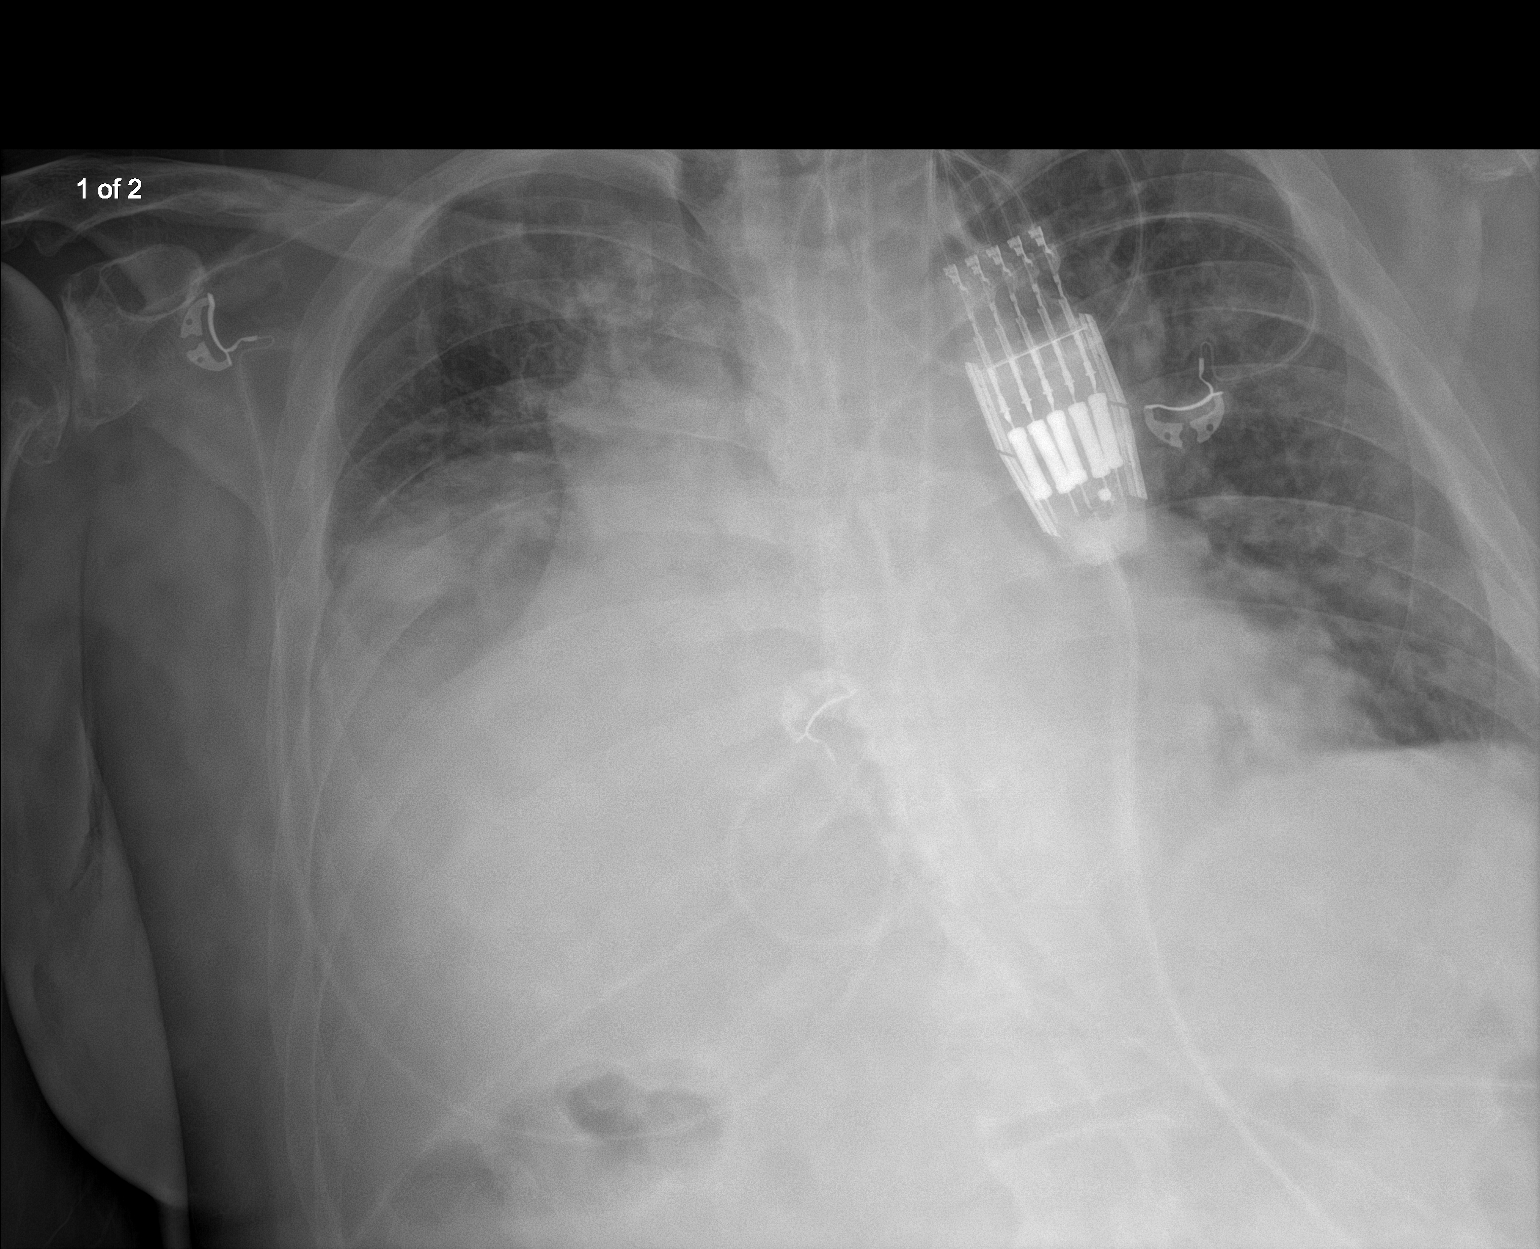
[im 2/2]
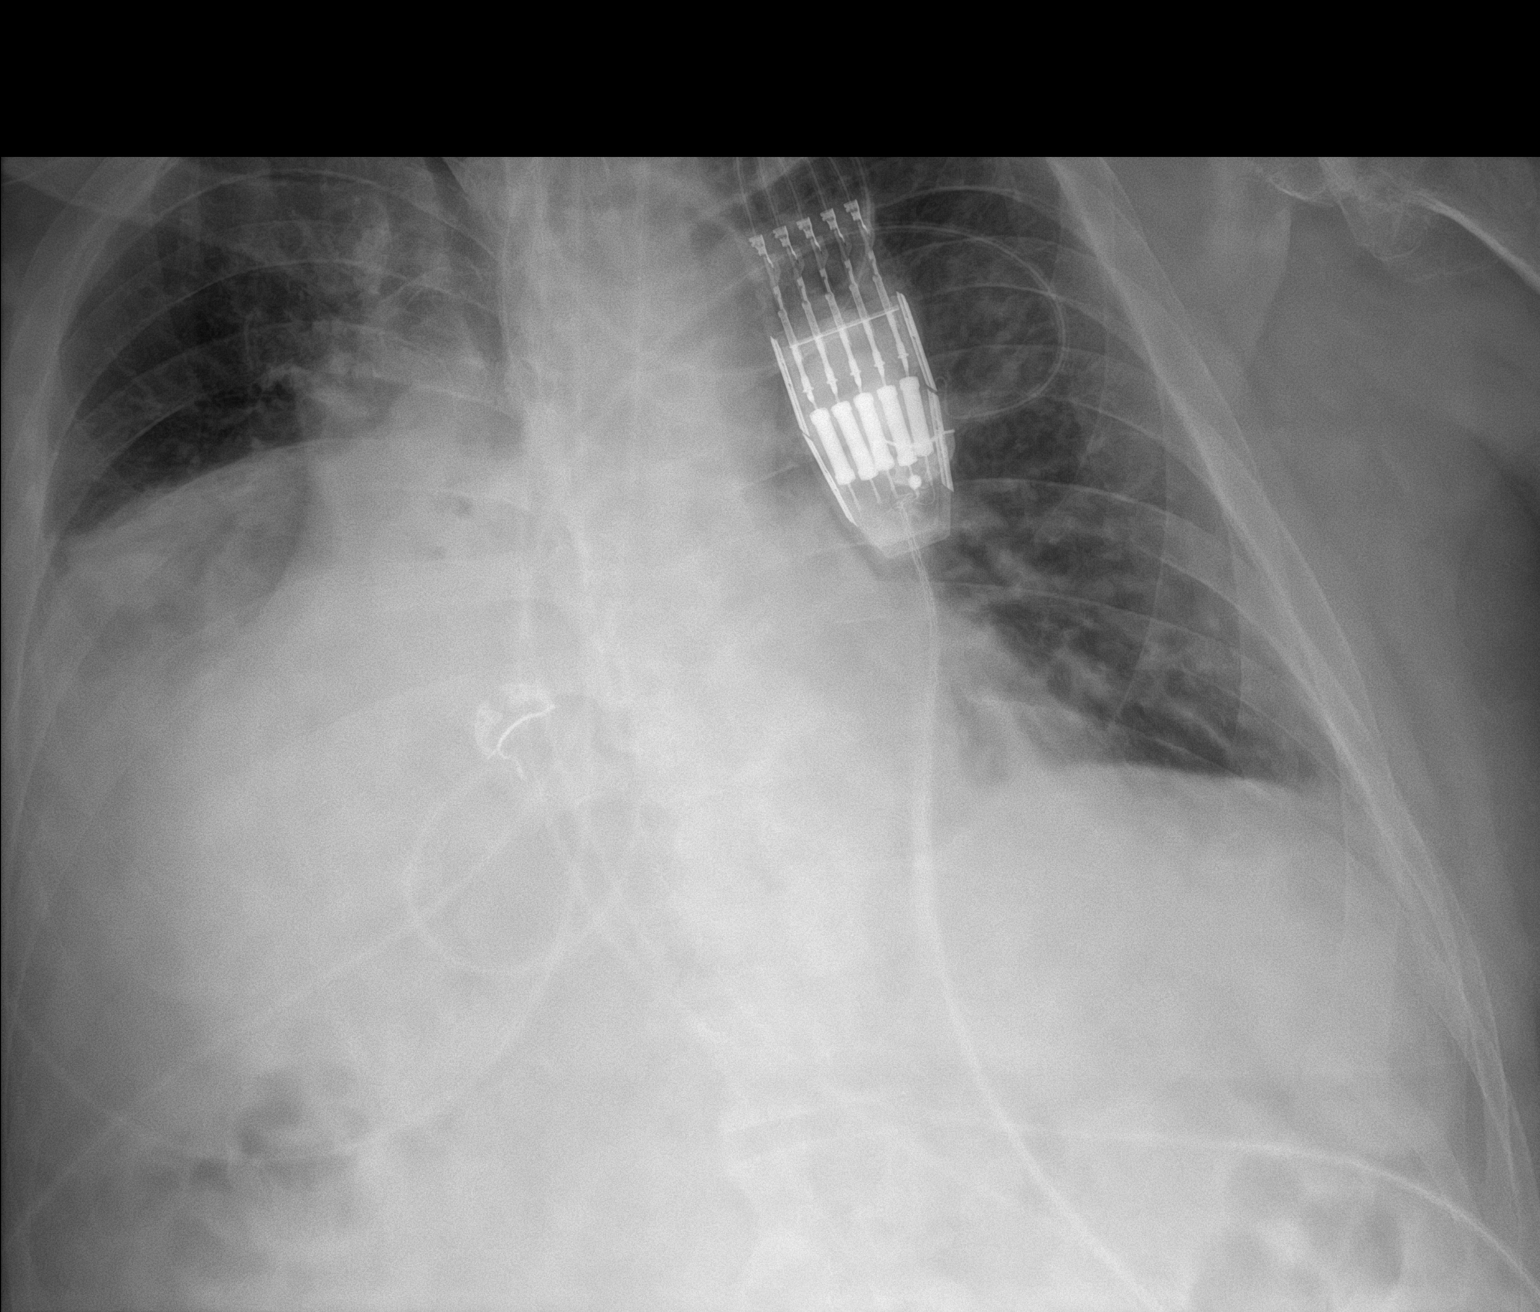

[2 of 2 positions shown; findings below may reference images not displayed]

FINDINGS: Interval extubation. Interval development of a right middle and
lower lobe collapse and probable discoid atelectasis or infiltrate
within the right upper lobe. Reticulonodular infiltrate has
developed throughout the left lung, new since prior examination,
suspicious for atypical infection. No pneumothorax or pleural
effusion. Cardiac size is mildly enlarged, unchanged. No acute bone
abnormality.
IMPRESSION: Interval extubation.  Persistent pulmonary hypoinflation.

Interval development of right middle and lower lobe collapse.
Probable discoid atelectasis within the right upper lobe.

Interval development of reticulonodular infiltrate throughout the
left lung suspicious for atypical infection in the appropriate
clinical setting.

Stable cardiomegaly.

## 2022-04-30 IMAGING — CT CT CHEST W/O CM
2 of 4 series · 11 of 36 positions shown, 13 images · non-contrast
Comparison: Chest radiograph 02/26/2021
COMPARISON: Chest radiograph 02/26/2021

Addendum:
CLINICAL DATA: Evaluate adenopathy.

EXAM:
CT CHEST WITHOUT CONTRAST
TECHNIQUE: Multidetector CT imaging of the chest was performed following the
standard protocol without IV contrast.

[Series 2: chest 2.00 br40 s3 · axial · 0.76mm/px · z∈[-996,-744]mm · 8 of 150 slices shown, 10 images (1 of 2)]
[im 12/150  mediastinal]
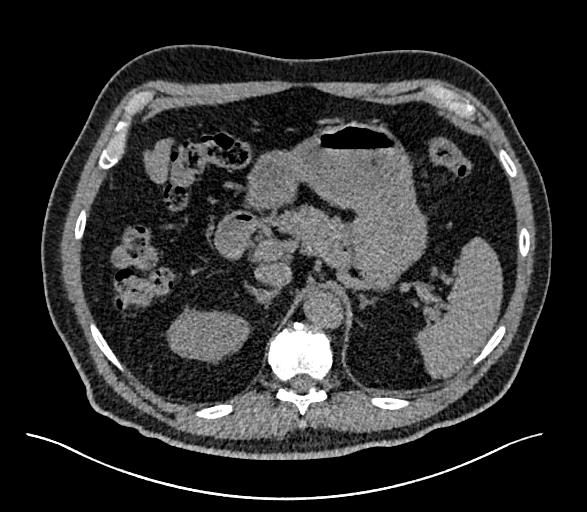
[im 12/150  lung]
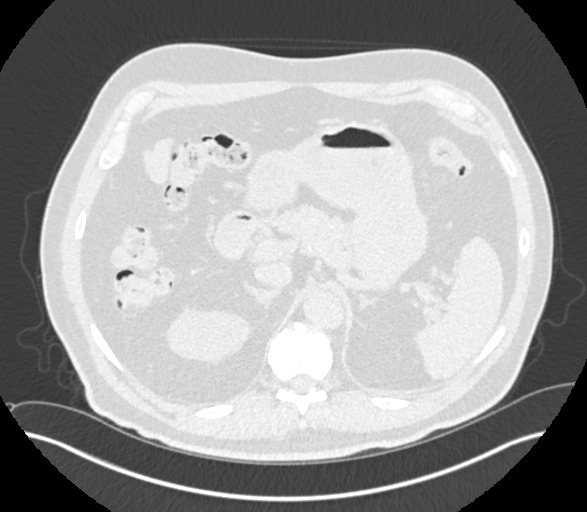
[im 35/150  lung]
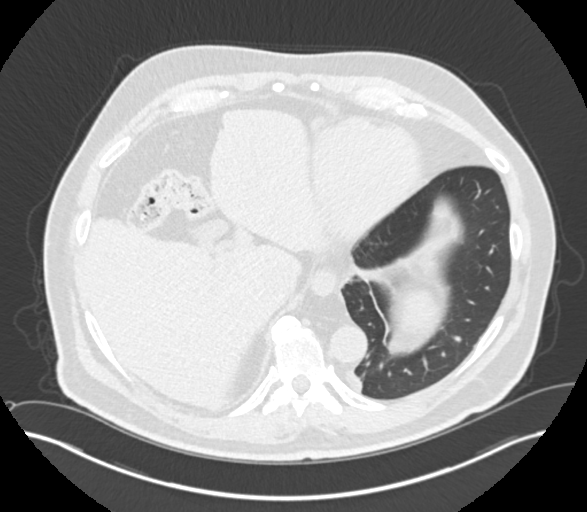
[im 46/150  lung]
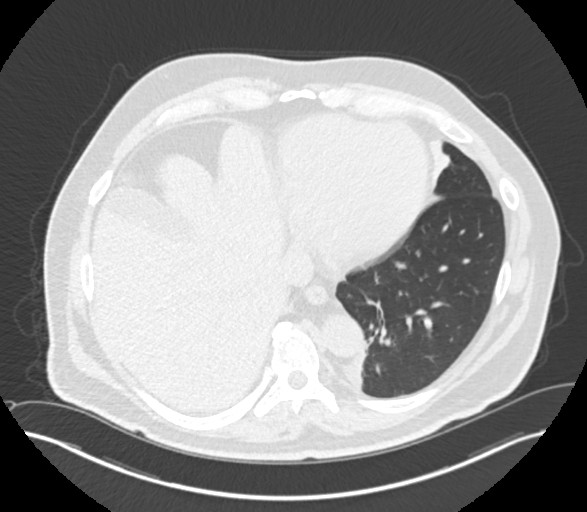
[im 69/150  lung]
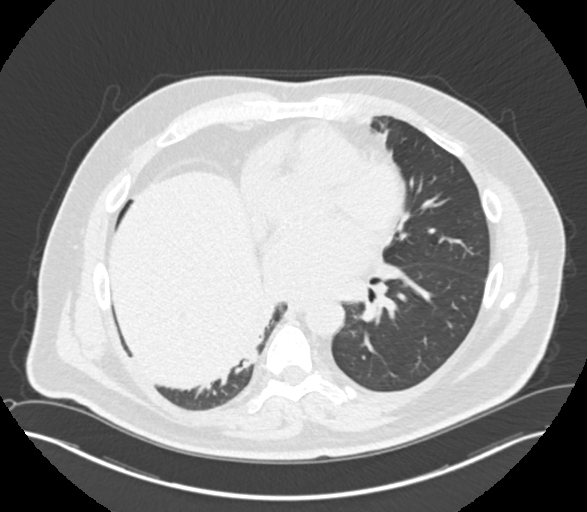
[im 81/150  mediastinal]
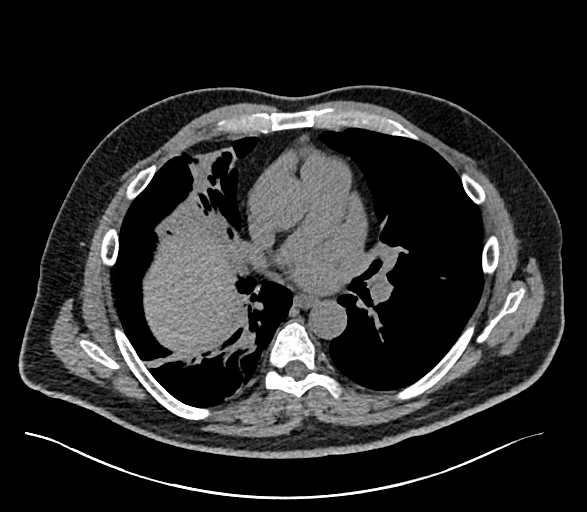
[im 81/150  lung]
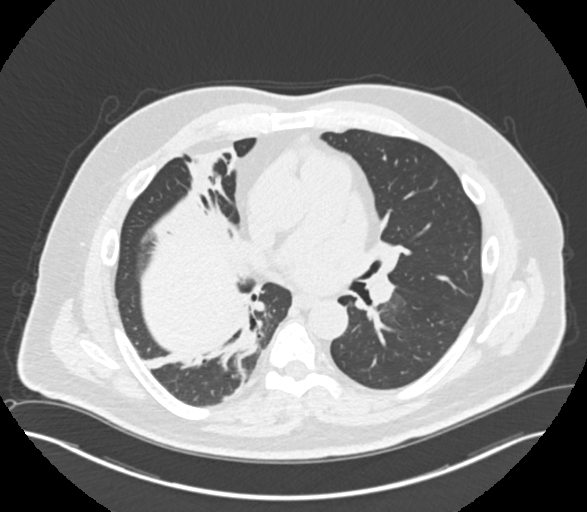
[im 104/150  lung]
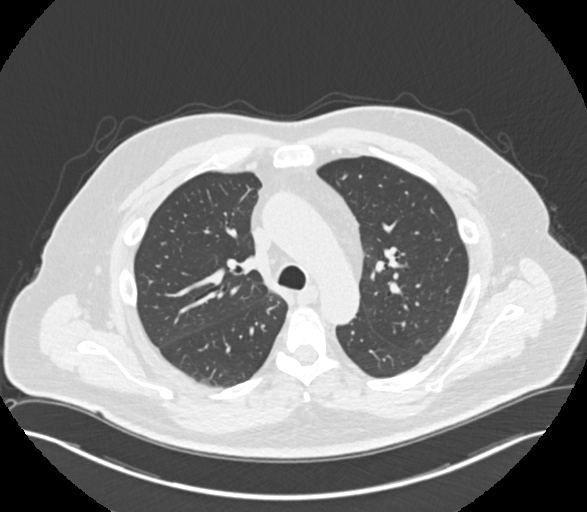
[im 115/150  lung]
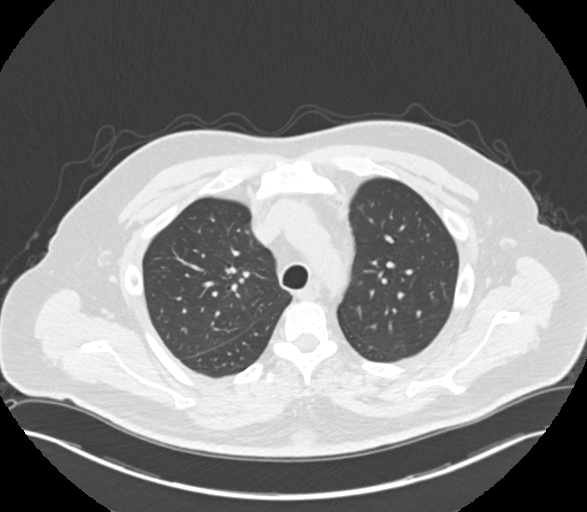
[im 138/150  lung]
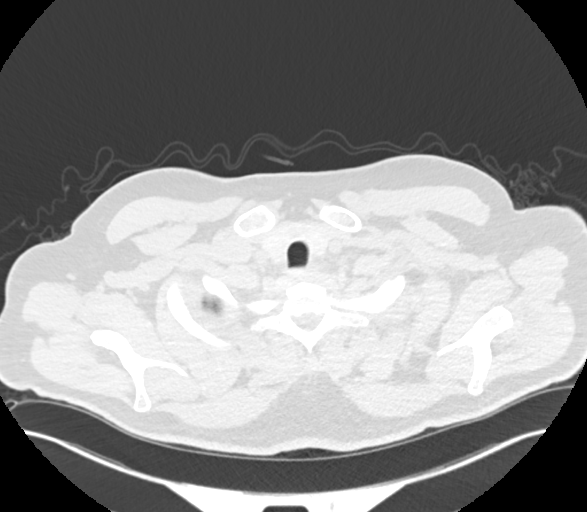

[Series 4: chest 2.00 br40 s3 · coronal · 0.59mm/px · 3 of 195 slices shown (2 of 2)]
[im 39/195  lung]
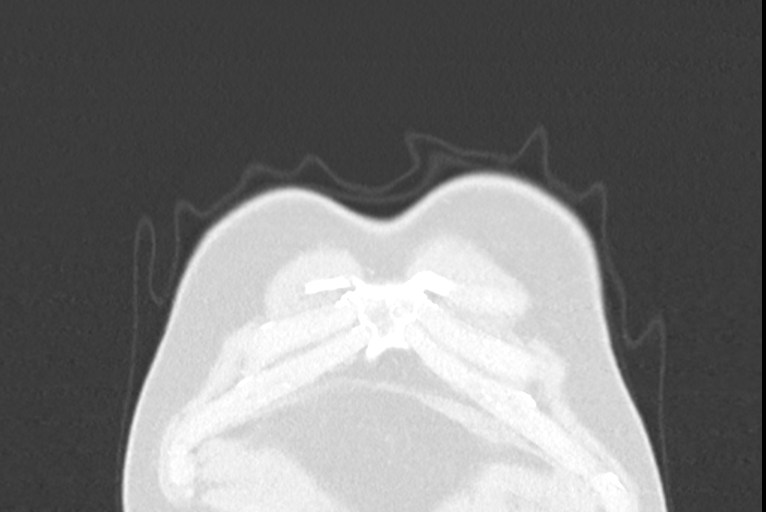
[im 78/195  lung]
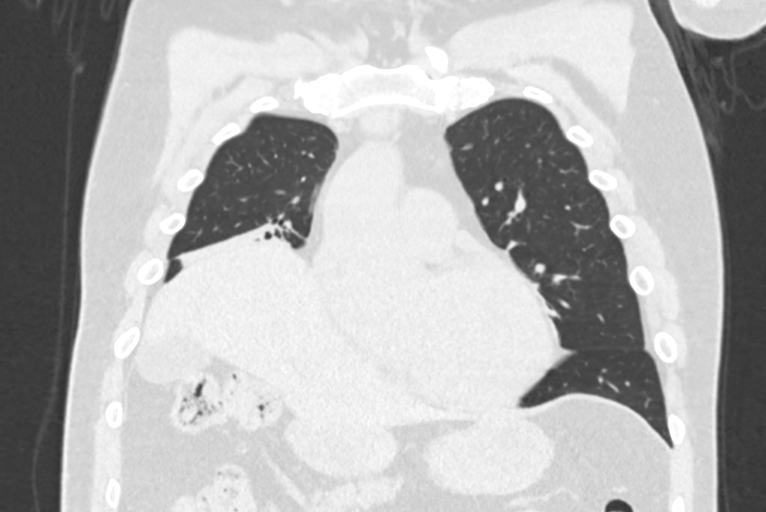
[im 117/195  lung]
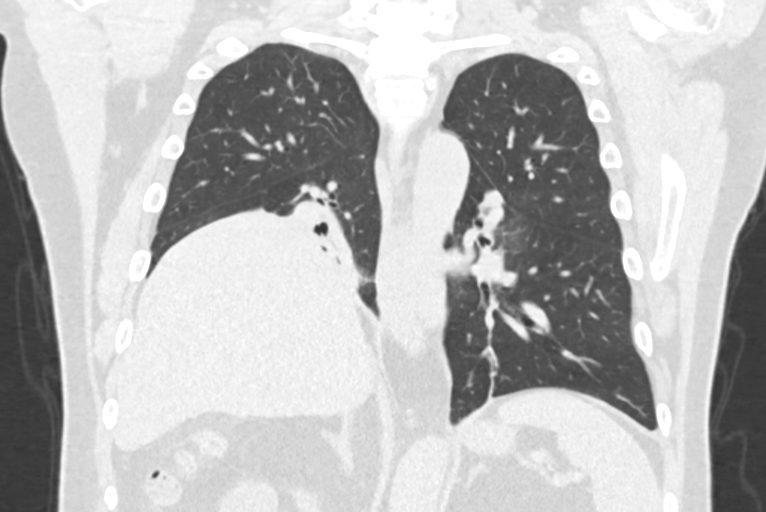

[11 of 36 positions shown; findings below may reference images not displayed]

FINDINGS: Cardiovascular: Heart size appears within normal limits. No
pericardial effusion. Aortic atherosclerosis.

Mediastinum/Nodes: Normal appearance of the thyroid gland. The
trachea appears patent and is midline. Normal appearance of the
esophagus.

No enlarged axillary, supraclavicular, or mediastinal lymph nodes.
Hilar lymph nodes are suboptimally evaluated due to lack of IV
contrast material.

Lungs/Pleura: Marked asymmetric elevation of the right hemidiaphragm
is again noted. There is partial atelectasis of the right lower lobe
and right middle lobe. No pleural effusion or interstitial edema.
Within the posterior medial left lower lobe there is a subpleural
density measuring 1.7 x 2.9 by 2.5 cm, image 98/2 and image 139/4.

Upper Abdomen: No acute abnormality. Small stones noted within the
gallbladder.

Musculoskeletal: Degenerative disc disease noted within the thoracic
spine. No acute or suspicious osseous findings. There is a
well-circumscribed fluid density structure within the subcutaneous
soft tissues of the posterior chest wall along the midline. This
measures 3.1 x 2.4 cm and is favored to represent a benign sebaceous
cyst.
IMPRESSION: 1. There is a subpleural density within the posterior medial left
lower lobe measuring up to 2.9 cm. Differential considerations
include consolidation due to pneumonia versus underlying malignancy.
Consider one of the following in 3 months for both low-risk and
high-risk individuals: (a) repeat chest CT, (b) follow-up PET-CT, or
(c) tissue sampling. This recommendation follows the consensus
statement: Guidelines for Management of Incidental Pulmonary Nodules
Detected on CT Images: From the [HOSPITAL] 6467; Radiology
6467; [DATE].
2. Marked asymmetric elevation of the right hemidiaphragm with
partial atelectasis of the right middle lobe and right lower lobe.
3. Aortic atherosclerosis.
4. Gallstones.

Aortic Atherosclerosis (W4EDD-23V.V).

ADDENDUM:
Case reviewed for insurance purposes and I am in agreement with the
published report.

*** End of Addendum ***
FINDINGS: Cardiovascular: Heart size appears within normal limits. No
pericardial effusion. Aortic atherosclerosis.

Mediastinum/Nodes: Normal appearance of the thyroid gland. The
trachea appears patent and is midline. Normal appearance of the
esophagus.

No enlarged axillary, supraclavicular, or mediastinal lymph nodes.
Hilar lymph nodes are suboptimally evaluated due to lack of IV
contrast material.

Lungs/Pleura: Marked asymmetric elevation of the right hemidiaphragm
is again noted. There is partial atelectasis of the right lower lobe
and right middle lobe. No pleural effusion or interstitial edema.
Within the posterior medial left lower lobe there is a subpleural
density measuring 1.7 x 2.9 by 2.5 cm, image 98/2 and image 139/4.

Upper Abdomen: No acute abnormality. Small stones noted within the
gallbladder.

Musculoskeletal: Degenerative disc disease noted within the thoracic
spine. No acute or suspicious osseous findings. There is a
well-circumscribed fluid density structure within the subcutaneous
soft tissues of the posterior chest wall along the midline. This
measures 3.1 x 2.4 cm and is favored to represent a benign sebaceous
cyst.
IMPRESSION: 1. There is a subpleural density within the posterior medial left
lower lobe measuring up to 2.9 cm. Differential considerations
include consolidation due to pneumonia versus underlying malignancy.
Consider one of the following in 3 months for both low-risk and
high-risk individuals: (a) repeat chest CT, (b) follow-up PET-CT, or
(c) tissue sampling. This recommendation follows the consensus
statement: Guidelines for Management of Incidental Pulmonary Nodules
Detected on CT Images: From the [HOSPITAL] 6467; Radiology
6467; [DATE].
2. Marked asymmetric elevation of the right hemidiaphragm with
partial atelectasis of the right middle lobe and right lower lobe.
3. Aortic atherosclerosis.
4. Gallstones.

Aortic Atherosclerosis (W4EDD-23V.V).

## 2022-05-06 ENCOUNTER — Other Ambulatory Visit (HOSPITAL_COMMUNITY): Payer: Self-pay

## 2022-05-06 ENCOUNTER — Other Ambulatory Visit: Payer: Self-pay | Admitting: *Deleted

## 2022-05-06 DIAGNOSIS — J4520 Mild intermittent asthma, uncomplicated: Secondary | ICD-10-CM

## 2022-05-06 NOTE — Progress Notes (Signed)
Fax received from pt's pharmacy stating that pt's Advair HFA 115/21 was needing a prior authorization. Please advise on this.

## 2022-05-13 ENCOUNTER — Emergency Department (HOSPITAL_COMMUNITY): Payer: BC Managed Care – PPO

## 2022-05-13 ENCOUNTER — Other Ambulatory Visit: Payer: Self-pay

## 2022-05-13 ENCOUNTER — Encounter (HOSPITAL_COMMUNITY): Payer: Self-pay

## 2022-05-13 ENCOUNTER — Telehealth: Payer: Self-pay | Admitting: Pulmonary Disease

## 2022-05-13 ENCOUNTER — Inpatient Hospital Stay (HOSPITAL_COMMUNITY)
Admission: EM | Admit: 2022-05-13 | Discharge: 2022-05-17 | DRG: 193 | Disposition: A | Discharge status: Home / Self Care | Payer: BC Managed Care – PPO | Attending: Family Medicine | Admitting: Family Medicine

## 2022-05-13 DIAGNOSIS — I341 Nonrheumatic mitral (valve) prolapse: Secondary | ICD-10-CM | POA: Diagnosis present

## 2022-05-13 DIAGNOSIS — Z86718 Personal history of other venous thrombosis and embolism: Secondary | ICD-10-CM | POA: Diagnosis not present

## 2022-05-13 DIAGNOSIS — J9621 Acute and chronic respiratory failure with hypoxia: Secondary | ICD-10-CM | POA: Diagnosis not present

## 2022-05-13 DIAGNOSIS — Z20822 Contact with and (suspected) exposure to covid-19: Secondary | ICD-10-CM | POA: Diagnosis present

## 2022-05-13 DIAGNOSIS — Z79890 Hormone replacement therapy (postmenopausal): Secondary | ICD-10-CM

## 2022-05-13 DIAGNOSIS — E8729 Other acidosis: Secondary | ICD-10-CM | POA: Diagnosis present

## 2022-05-13 DIAGNOSIS — E785 Hyperlipidemia, unspecified: Secondary | ICD-10-CM | POA: Diagnosis not present

## 2022-05-13 DIAGNOSIS — J9811 Atelectasis: Secondary | ICD-10-CM | POA: Diagnosis not present

## 2022-05-13 DIAGNOSIS — I10 Essential (primary) hypertension: Secondary | ICD-10-CM | POA: Diagnosis not present

## 2022-05-13 DIAGNOSIS — J189 Pneumonia, unspecified organism: Secondary | ICD-10-CM | POA: Diagnosis not present

## 2022-05-13 DIAGNOSIS — J4520 Mild intermittent asthma, uncomplicated: Secondary | ICD-10-CM

## 2022-05-13 DIAGNOSIS — Z8249 Family history of ischemic heart disease and other diseases of the circulatory system: Secondary | ICD-10-CM

## 2022-05-13 DIAGNOSIS — E669 Obesity, unspecified: Secondary | ICD-10-CM | POA: Diagnosis not present

## 2022-05-13 DIAGNOSIS — J9622 Acute and chronic respiratory failure with hypercapnia: Secondary | ICD-10-CM | POA: Diagnosis present

## 2022-05-13 DIAGNOSIS — J9601 Acute respiratory failure with hypoxia: Principal | ICD-10-CM

## 2022-05-13 DIAGNOSIS — Z7901 Long term (current) use of anticoagulants: Secondary | ICD-10-CM

## 2022-05-13 DIAGNOSIS — E876 Hypokalemia: Secondary | ICD-10-CM | POA: Diagnosis present

## 2022-05-13 DIAGNOSIS — Z7951 Long term (current) use of inhaled steroids: Secondary | ICD-10-CM

## 2022-05-13 DIAGNOSIS — Z79899 Other long term (current) drug therapy: Secondary | ICD-10-CM

## 2022-05-13 DIAGNOSIS — J9600 Acute respiratory failure, unspecified whether with hypoxia or hypercapnia: Secondary | ICD-10-CM

## 2022-05-13 DIAGNOSIS — R0902 Hypoxemia: Secondary | ICD-10-CM

## 2022-05-13 DIAGNOSIS — I2721 Secondary pulmonary arterial hypertension: Secondary | ICD-10-CM | POA: Diagnosis present

## 2022-05-13 DIAGNOSIS — I77810 Thoracic aortic ectasia: Secondary | ICD-10-CM | POA: Diagnosis present

## 2022-05-13 DIAGNOSIS — I340 Nonrheumatic mitral (valve) insufficiency: Secondary | ICD-10-CM | POA: Diagnosis not present

## 2022-05-13 DIAGNOSIS — G9341 Metabolic encephalopathy: Secondary | ICD-10-CM | POA: Diagnosis not present

## 2022-05-13 DIAGNOSIS — R0981 Nasal congestion: Secondary | ICD-10-CM | POA: Diagnosis present

## 2022-05-13 DIAGNOSIS — Z91041 Radiographic dye allergy status: Secondary | ICD-10-CM

## 2022-05-13 DIAGNOSIS — Z6834 Body mass index (BMI) 34.0-34.9, adult: Secondary | ICD-10-CM | POA: Diagnosis not present

## 2022-05-13 DIAGNOSIS — G4733 Obstructive sleep apnea (adult) (pediatric): Secondary | ICD-10-CM | POA: Diagnosis present

## 2022-05-13 DIAGNOSIS — R911 Solitary pulmonary nodule: Secondary | ICD-10-CM | POA: Diagnosis present

## 2022-05-13 LAB — I-STAT VENOUS BLOOD GAS, ED
Acid-Base Excess: 13 mmol/L — ABNORMAL HIGH (ref 0.0–2.0)
Bicarbonate: 42.5 mmol/L — ABNORMAL HIGH (ref 20.0–28.0)
Calcium, Ion: 1.34 mmol/L (ref 1.15–1.40)
HCT: 51 % (ref 39.0–52.0)
Hemoglobin: 17.3 g/dL — ABNORMAL HIGH (ref 13.0–17.0)
O2 Saturation: 86 %
Potassium: 3.9 mmol/L (ref 3.5–5.1)
Sodium: 141 mmol/L (ref 135–145)
TCO2: 45 mmol/L — ABNORMAL HIGH (ref 22–32)
pCO2, Ven: 71.3 mmHg (ref 44–60)
pH, Ven: 7.384 (ref 7.25–7.43)
pO2, Ven: 56 mmHg — ABNORMAL HIGH (ref 32–45)

## 2022-05-13 LAB — CBC
HCT: 52.9 % — ABNORMAL HIGH (ref 39.0–52.0)
Hemoglobin: 17.1 g/dL — ABNORMAL HIGH (ref 13.0–17.0)
MCH: 31.9 pg (ref 26.0–34.0)
MCHC: 32.3 g/dL (ref 30.0–36.0)
MCV: 98.7 fL (ref 80.0–100.0)
Platelets: 191 10*3/uL (ref 150–400)
RBC: 5.36 MIL/uL (ref 4.22–5.81)
RDW: 14.6 % (ref 11.5–15.5)
WBC: 6.3 10*3/uL (ref 4.0–10.5)
nRBC: 0 % (ref 0.0–0.2)

## 2022-05-13 LAB — BASIC METABOLIC PANEL
Anion gap: 8 (ref 5–15)
BUN: 12 mg/dL (ref 6–20)
CO2: 36 mmol/L — ABNORMAL HIGH (ref 22–32)
Calcium: 10.4 mg/dL — ABNORMAL HIGH (ref 8.9–10.3)
Chloride: 98 mmol/L (ref 98–111)
Creatinine, Ser: 1.05 mg/dL (ref 0.61–1.24)
GFR, Estimated: >60 mL/min (ref >60)
Glucose, Bld: 173 mg/dL — ABNORMAL HIGH (ref 70–99)
Potassium: 3.7 mmol/L (ref 3.5–5.1)
Sodium: 142 mmol/L (ref 135–145)

## 2022-05-13 LAB — DIFFERENTIAL
Abs Immature Granulocytes: 0.02 10*3/uL (ref 0.00–0.07)
Basophils Absolute: 0 10*3/uL (ref 0.0–0.1)
Basophils Relative: 1 %
Eosinophils Absolute: 0.1 10*3/uL (ref 0.0–0.5)
Eosinophils Relative: 2 %
Immature Granulocytes: 0 %
Lymphocytes Relative: 13 %
Lymphs Abs: 0.8 10*3/uL (ref 0.7–4.0)
Monocytes Absolute: 0.5 10*3/uL (ref 0.1–1.0)
Monocytes Relative: 9 %
Neutro Abs: 4.5 10*3/uL (ref 1.7–7.7)
Neutrophils Relative %: 75 %

## 2022-05-13 LAB — RESP PANEL BY RT-PCR (FLU A&B, COVID) ARPGX2
Influenza A by PCR: NEGATIVE
Influenza B by PCR: NEGATIVE
SARS Coronavirus 2 by RT PCR: NEGATIVE

## 2022-05-13 LAB — LACTIC ACID, PLASMA: Lactic Acid, Venous: 1 mmol/L (ref 0.5–1.9)

## 2022-05-13 LAB — TROPONIN I (HIGH SENSITIVITY)
Troponin I (High Sensitivity): 7 ng/L (ref <18)
Troponin I (High Sensitivity): 6 ng/L (ref <18)

## 2022-05-13 LAB — PROTIME-INR
INR: 1.7 — ABNORMAL HIGH (ref 0.8–1.2)
Prothrombin Time: 19.9 seconds — ABNORMAL HIGH (ref 11.4–15.2)

## 2022-05-13 LAB — BRAIN NATRIURETIC PEPTIDE: B Natriuretic Peptide: 98.2 pg/mL (ref 0.0–100.0)

## 2022-05-13 MED ORDER — SENNOSIDES-DOCUSATE SODIUM 8.6-50 MG PO TABS: 1.0000 | ORAL_TABLET | Freq: Every evening | ORAL | Status: DC | PRN

## 2022-05-13 MED ORDER — ONDANSETRON HCL 4 MG/2ML IJ SOLN: 4.0000 mg | Freq: Four times a day (QID) | INTRAMUSCULAR | Status: DC | PRN

## 2022-05-13 MED ORDER — SODIUM CHLORIDE 0.9% FLUSH
3.0000 mL | Freq: Two times a day (BID) | INTRAVENOUS | Status: DC
Start: 2022-05-13 — End: 2022-05-17
  Administered 2022-05-13 – 2022-05-17 (×8): 3 mL via INTRAVENOUS

## 2022-05-13 MED ORDER — RIVAROXABAN 10 MG PO TABS
10.0000 mg | ORAL_TABLET | Freq: Every day | ORAL | Status: DC
  Administered 2022-05-14 – 2022-05-16 (×3): 10 mg via ORAL
  Filled 2022-05-13 (×4): qty 1

## 2022-05-13 MED ORDER — SODIUM CHLORIDE 0.9 % IV SOLN
500.0000 mg | INTRAVENOUS | Status: DC
  Administered 2022-05-13 – 2022-05-16 (×4): 500 mg via INTRAVENOUS
  Filled 2022-05-13 (×5): qty 5

## 2022-05-13 MED ORDER — ONDANSETRON HCL 4 MG PO TABS: 4.0000 mg | ORAL_TABLET | Freq: Four times a day (QID) | ORAL | Status: DC | PRN

## 2022-05-13 MED ORDER — ATORVASTATIN CALCIUM 10 MG PO TABS
20.0000 mg | ORAL_TABLET | Freq: Every day | ORAL | Status: DC
  Administered 2022-05-14 – 2022-05-16 (×3): 20 mg via ORAL
  Filled 2022-05-13 (×3): qty 2

## 2022-05-13 MED ORDER — SODIUM CHLORIDE 0.9 % IV SOLN
2.0000 g | INTRAVENOUS | Status: DC
  Administered 2022-05-13 – 2022-05-16 (×4): 2 g via INTRAVENOUS
  Filled 2022-05-13 (×4): qty 20

## 2022-05-13 MED ORDER — ACETAMINOPHEN 325 MG PO TABS: 650.0000 mg | ORAL_TABLET | Freq: Four times a day (QID) | ORAL | Status: DC | PRN

## 2022-05-13 MED ORDER — LACTATED RINGERS IV SOLN: INTRAVENOUS | Status: DC

## 2022-05-13 MED ORDER — MOMETASONE FURO-FORMOTEROL FUM 200-5 MCG/ACT IN AERO: 2.0000 | INHALATION_SPRAY | Freq: Two times a day (BID) | RESPIRATORY_TRACT | Status: DC

## 2022-05-13 MED ORDER — MOMETASONE FURO-FORMOTEROL FUM 200-5 MCG/ACT IN AERO
2.0000 | INHALATION_SPRAY | Freq: Two times a day (BID) | RESPIRATORY_TRACT | Status: DC
  Filled 2022-05-13: qty 8.8

## 2022-05-13 MED ORDER — ACETAMINOPHEN 650 MG RE SUPP: 650.0000 mg | Freq: Four times a day (QID) | RECTAL | Status: DC | PRN

## 2022-05-13 MED ORDER — IPRATROPIUM-ALBUTEROL 0.5-2.5 (3) MG/3ML IN SOLN
3.0000 mL | Freq: Four times a day (QID) | RESPIRATORY_TRACT | Status: DC | PRN
Start: 2022-05-13 — End: 2022-05-14
  Administered 2022-05-14: 3 mL via RESPIRATORY_TRACT
  Filled 2022-05-13: qty 3

## 2022-05-13 NOTE — Telephone Encounter (Signed)
Called and spoke with patient's wife Marylene Land. She stated that she checked the patient's O2 sats this morning and he was at 77% on room air. He had complained of a sinus headache for the past 2 days. She believes this episode came from him trying to wean himself off of the Advair. She took him to the ED at [facility] this morning.   While on  the phone she mentioned that he has not been able to find the charger for his POC device. The battery is completely dead. She wasn't sure if he received the device from [employer]. I advised her that I would call to see and if so, we can place an order for him to get a replacement charger. She verbalized understanding.   Called [employer] and spoke with the rep. She confirmed that the patient received the POC from them. She also stated that it might be best to send the order to the local [employer] in order for it to get processed quicker.   Dr. Judeth Horn, are you ok with placing an order for a replacement POC charger for this patient since Dr. Francine Graven is not available today? Thanks!

## 2022-05-13 NOTE — ED Notes (Signed)
Pt back from CT. Pt on trial off of BiPap. Pt placed on 6L [place], O2 maintained 87%-90%. RN switched pt to [place] O2 on 11L. Pt O2 maintaining 91-94%.

## 2022-05-13 NOTE — Hospital Course (Signed)
Danny Norris is a 61 y.o. male with medical history significant for chronic respiratory failure with hypoxia (using supplemental O2 only with exertion), HTN, history of DVT on Xarelto, mild PAH, OSA on CPAP who is admitted with acute on chronic respiratory failure with hypoxia due to multifocal pneumonia.

## 2022-05-13 NOTE — Progress Notes (Signed)
Elink following sepsis bundle. °

## 2022-05-13 NOTE — Assessment & Plan Note (Signed)
Previous on Eliquis, patient states now taking Xarelto.  Continue Xarelto.

## 2022-05-13 NOTE — H&P (Signed)
History and Physical    Danny Norris IZT:[id number] DOB: 04-Nov-1960 DOA: 05/13/2022  PCP: Deatra James, MD  Patient coming from: Home  I have personally briefly reviewed patient's old medical records in [facility]  Chief Complaint: Shortness of breath, hypoxia  HPI: Danny Norris is a 61 y.o. male with medical history significant for chronic respiratory failure with hypoxia (using supplemental O2 only with exertion), HTN, history of DVT on Xarelto, mild PAH, OSA on CPAP who presented to the ED for evaluation of hypoxia.  Patient reports progressive shortness of breath over the last 4 days.  Danny Norris has had headache, sinus congestion, clear rhinorrhea.  Danny Norris denies any significant cough.  Danny Norris normally uses supplemental oxygen only with exertion but has been hypoxic at home with SPO2 down into the 70s.  Danny Norris denies any nausea, vomiting, aspiration event.  Danny Norris is generally fatigued.  Danny Norris reports similar symptoms in pneumonia a year ago.  ED Course  Labs/Imaging on admission: I have personally reviewed following labs and imaging studies.  Initial vital showed BP 129/73, pulse 80, RR 16, temp 98.0 F, SPO2 79% on room air.  Patient was placed on 6 L O2 via [place] and subsequently placed on BiPAP.  Labs show WBC 6.3, hemoglobin 17.1, platelets 191,000, sodium 142, potassium 3.7, bicarb 36, BUN 12, creatinine 1.05, serum glucose 173, troponin 7, BNP 98.2.  SARS-CoV-2 and influenza PCR negative.  Portable chest x-ray shows enlarged cardiac silhouette with stable elevated right hemidiaphragm and minor atelectasis in the lungs.  CT chest without contrast shows consolidative opacity with air bronchograms at right medial lung base with peribronchial thickness and stranding at the bilateral perihilar regions.  Mid ascending thoracic aorta measures 4.4 cm in greatest dimension.  Main pulmonary artery measures 3.4 cm.  Patient started on IV ceftriaxone and azithromycin.  The hospitalist service was consulted to  admit for further evaluation and management.  Review of Systems: All systems reviewed and are negative except as documented in history of present illness above.   Past Medical History:  Diagnosis Date   Dyslipidemia    HTN (hypertension)    Mitral valve regurgitation    a. 05/2011: TEE showing significant MVP especially involving the P2 segment without evidence for ruptured chordae and had eccentric MR anteriorly directed. b. 03/2015: echo showing EF 65-70% with posterior MV leaflet thickening and prolapse with mild MR.   Normal coronary arteries    a. normal cors by cath in 08/2011    Past Surgical History:  Procedure Laterality Date   EYE SURGERY  1975   HERNIA REPAIR Right 2009   Rt inguinal repair   LEFT AND RIGHT HEART CATHETERIZATION WITH CORONARY ANGIOGRAM N/A 09/01/2011   Procedure: LEFT AND RIGHT HEART CATHETERIZATION WITH CORONARY ANGIOGRAM;  Surgeon: Lennette Bihari, MD;  Location: [facility] CATH LAB;  Service: Cardiovascular;  Laterality: N/A;   RIGHT HEART CATH N/A 12/20/2021   Procedure: RIGHT HEART CATH;  Surgeon: Dolores Patty, MD;  Location: [facility] INVASIVE CV LAB;  Service: Cardiovascular;  Laterality: N/A;    Social History:  reports that Danny Norris has never smoked. Danny Norris has never used smokeless tobacco. Danny Norris reports that Danny Norris does not drink alcohol. No history on file for drug use.  Allergies  Allergen Reactions   Contrast Media [Iodinated Contrast Media] Rash    Family History  Problem Relation Age of Onset   Sleep apnea Brother    Congestive Heart Failure Mother      Prior to Admission  medications   Medication Sig Start Date End Date Taking? Authorizing Provider  amLODipine (NORVASC) 5 MG tablet Take 5 mg by mouth daily.    [provider]  apixaban (ELIQUIS) 5 MG TABS tablet Take 5 mg by mouth 2 (two) times daily.    [provider]  atorvastatin (LIPITOR) 20 MG tablet Take 1 tablet (20 mg total) by mouth daily at 6 PM. 09/17/19   Lennette Bihari, MD   fluticasone-salmeterol (ADVAIR HFA) 115-21 MCG/ACT inhaler Inhale 2 puffs into the lungs 2 (two) times daily. 01/03/22   Martina Sinner, MD  hydrochlorothiazide (HYDRODIURIL) 25 MG tablet TAKE 1 TABLET BY MOUTH EVERY DAY 03/25/20   Lennette Bihari, MD  melatonin 5 MG TABS Take 5 mg by mouth at bedtime as needed (sleep).    [provider]  metoprolol succinate (TOPROL-XL) 50 MG 24 hr tablet TAKE 1 TABLET BY MOUTH DAILY WITH OR IMMEDIATELY FOLLOWING A MEAL 03/25/20   Lennette Bihari, MD  potassium chloride SA (KLOR-CON M) 20 MEQ tablet Take 1 tablet (20 mEq total) by mouth daily. 12/20/21   Bensimhon, Bevelyn Buckles, MD    Physical Exam: Vitals:   05/13/22 1502 05/13/22 1615 05/13/22 1700 05/13/22 1715  BP: 108/70 131/78 134/79 125/83  Pulse: (!) 56 62 63 65  Resp: (!) 22 18 18  (!) 22  Temp:   98.2 F (36.8 C)   TempSrc:   Oral   SpO2: 94% 92% 92% 92%  Weight:      Height:       Constitutional: Resting in bed with head elevated, appears fatigued but in NAD, calm, comfortable Eyes: EOMI, lids and conjunctivae normal ENMT: Mucous membranes are moist. Posterior pharynx clear of any exudate or lesions.Normal dentition.  Neck: normal, supple, no masses. Respiratory: Rales right lung field, faint end expiratory wheezing bilaterally.  Normal respiratory effort while on 10 L O2 via [place]. No accessory muscle use.  Cardiovascular: Regular rate and rhythm, no murmurs / rubs / gallops. No extremity edema.  Abdomen: no tenderness, no masses palpated.  Musculoskeletal: no clubbing / cyanosis. No joint deformity upper and lower extremities. Good ROM, no contractures. Normal muscle tone.  Skin: no rashes, lesions, ulcers. No induration Neurologic:  Sensation intact. Strength 5/5 in all 4.  Psychiatric: Normal judgment and insight. Alert and oriented x 3. Normal mood.   EKG: Personally reviewed. Normal sinus rhythm without acute ischemic changes.  Similar to prior.  Assessment/Plan Principal  Problem:   Acute on chronic respiratory failure with hypoxia (HCC) Active Problems:   OSA (obstructive sleep apnea)   Multifocal pneumonia   History of DVT (deep vein thrombosis)   HTN (hypertension)   Ascending aorta dilatation (HCC)   SEBRON MCMAHILL is a 61 y.o. male with medical history significant for chronic respiratory failure with hypoxia (using supplemental O2 only with exertion), HTN, history of DVT on Xarelto, mild PAH, OSA on CPAP who is admitted with acute on chronic respiratory failure with hypoxia due to multifocal pneumonia.  Assessment and Plan: * Acute on chronic respiratory failure with hypoxia (HCC) Multifocal pneumonia New resting hypoxia with SPO2 79% while on room air.  Currently requiring 10-11 L O2 via [place].  CT chest showed multilobar right-sided pneumonia.  COVID/flu negative. -Continue IV ceftriaxone and azithromycin -Continue supplemental oxygen as needed, wean as able -BiPAP if needed -Follow blood cultures, strep pneumonia urinary antigen  OSA (obstructive sleep apnea) Mild PAH Continue CPAP nightly.  History of DVT (deep vein  thrombosis) Previous on Eliquis, patient states now taking Xarelto.  Continue Xarelto.  HTN (hypertension) Currently normotensive, hold home antihypertensives for now.  Ascending aorta dilatation (HCC) CT shows mid ascending thoracic aorta measuring 4.4 cm in greatest dimension.  Annual follow-up CTA or MRA is recommended.  DVT prophylaxis: rivaroxaban (XARELTO) tablet 10 mg Start: 05/13/22 1815 rivaroxaban (XARELTO) tablet 10 mg   Code Status: Full code, confirmed with patient on admission Family Communication: Discussed with sister at bedside Disposition Plan: From home, dispo pending clinical progress Consults called: None Severity of Illness: The appropriate patient status for this patient is INPATIENT. Inpatient status is judged to be reasonable and necessary in order to provide the required intensity of service to ensure  the patient's safety. The patient's presenting symptoms, physical exam findings, and initial radiographic and laboratory data in the context of their chronic comorbidities is felt to place them at high risk for further clinical deterioration. Furthermore, it is not anticipated that the patient will be medically stable for discharge from the hospital within 2 midnights of admission.   * I certify that at the point of admission it is my clinical judgment that the patient will require inpatient hospital care spanning beyond 2 midnights from the point of admission due to high intensity of service, high risk for further deterioration and high frequency of surveillance required.Darreld Mclean MD [facility]  If 7PM-7AM, please contact night-coverage [email or web address]  05/13/2022, 6:14 PM

## 2022-05-13 NOTE — ED Provider Triage Note (Signed)
Emergency Medicine Provider Triage Evaluation Note  AMAURI KEEFE , a 61 y.o. male  was evaluated in triage.  Pt complains of low oxygen saturation.  Patient oxygen saturation 79% in triage.  Workup initiated.  Patient taken to room 17.  Review of Systems  Positive:  Negative:   Physical Exam  BP (!) 155/91   Pulse 78   Temp 98 F (36.7 C) (Oral)   Resp (!) 34   Ht 6' (1.829 m)   Wt 117.5 kg   SpO2 94%   BMI 35.13 kg/m  Gen:   Awake, no distress   Resp:  Normal effort  MSK:   Moves extremities without difficulty  Other:  Diminished lung sounds  Medical Decision Making  Medically screening exam initiated at 12:25 PM.  Appropriate orders placed.  THEDORE PICKEL was informed that the remainder of the evaluation will be completed by another provider, this initial triage assessment does not replace that evaluation, and the importance of remaining in the ED until their evaluation is complete.     Al Decant, PA-C 05/13/22 1225

## 2022-05-13 NOTE — ED Provider Notes (Signed)
[facility] EMERGENCY DEPARTMENT Provider Note   CSN: [id number] Arrival date & time: 05/13/22  1159     History  Chief Complaint  Patient presents with   Shortness of Breath    SCOT Danny Norris is a 61 y.o. male.  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 Pt has required intubation in the past.  Patient states that he started getting URI-like symptoms for 5 days ago with some congestion and headache.  He also started noticing increasing shortness of breath.  Shortness of breath is worse than baseline, preventing him to complete ADL tasks and is normally able to.  He denies any cough, fevers, chills.     Home Medications Prior to Admission medications   Medication Sig Start Date End Date Taking? Authorizing Provider  amLODipine (NORVASC) 5 MG tablet Take 5 mg by mouth daily.    [provider]  apixaban (ELIQUIS) 5 MG TABS tablet Take 5 mg by mouth 2 (two) times daily.    [provider]  atorvastatin (LIPITOR) 20 MG tablet Take 1 tablet (20 mg total) by mouth daily at 6 PM. 09/17/19   Lennette Bihari, MD  fluticasone-salmeterol (ADVAIR HFA) 115-21 MCG/ACT inhaler Inhale 2 puffs into the lungs 2 (two) times daily. 01/03/22   Martina Sinner, MD  hydrochlorothiazide (HYDRODIURIL) 25 MG tablet TAKE 1 TABLET BY MOUTH EVERY DAY 03/25/20   Lennette Bihari, MD  melatonin 5 MG TABS Take 5 mg by mouth at bedtime as needed (sleep).    [provider]  metoprolol succinate (TOPROL-XL) 50 MG 24 hr tablet TAKE 1 TABLET BY MOUTH DAILY WITH OR IMMEDIATELY FOLLOWING A MEAL 03/25/20   Lennette Bihari, MD  potassium chloride SA (KLOR-CON M) 20 MEQ tablet Take 1 tablet (20 mEq total) by mouth daily. 12/20/21   Bensimhon, Bevelyn Buckles, MD      Allergies     Contrast media [iodinated contrast media]    Review of Systems   Review of Systems  All other systems reviewed and are negative.   Physical Exam Updated Vital Signs BP 108/70   Pulse (!) 56   Temp 98 F (36.7 C) (Oral)   Resp (!) 22   Ht 6' (1.829 m)   Wt 117.5 kg   SpO2 94%   BMI 35.13 kg/m  Physical Exam Vitals and nursing note reviewed.  Constitutional:      Appearance: He is well-developed.  HENT:     Head: Atraumatic.  Neck:     Vascular: No JVD.  Cardiovascular:     Rate and Rhythm: Normal rate.  Pulmonary:     Effort: Pulmonary effort is normal.     Breath sounds: No decreased breath sounds, wheezing, rhonchi or rales.  Musculoskeletal:     Cervical back: Neck supple.     Right lower leg: Edema present.     Left lower leg: Edema present.     Comments: 1+ pitting edema bilateral  Skin:    General: Skin is warm.  Neurological:     Mental Status: He is alert and oriented to person, place, and time.     ED Results / Procedures / Treatments   Labs (all labs ordered are listed, but only abnormal results are displayed) Labs Reviewed  BASIC METABOLIC  PANEL - Abnormal; Notable for the following components:      Result Value   CO2 36 (*)    Glucose, Bld 173 (*)    Calcium 10.4 (*)    All other components within normal limits  CBC - Abnormal; Notable for the following components:   Hemoglobin 17.1 (*)    HCT 52.9 (*)    All other components within normal limits  PROTIME-INR - Abnormal; Notable for the following components:   Prothrombin Time 19.9 (*)    INR 1.7 (*)    All other components within normal limits  I-STAT VENOUS BLOOD GAS, ED - Abnormal; Notable for the following components:   pCO2, Ven 71.3 (*)    pO2, Ven 56 (*)    Bicarbonate 42.5 (*)    TCO2 45 (*)    Acid-Base Excess 13.0 (*)    Hemoglobin 17.3 (*)    All other components within normal limits  RESP PANEL BY RT-PCR (FLU A&B, COVID) [id number]  BRAIN NATRIURETIC PEPTIDE  TROPONIN I  (HIGH SENSITIVITY)  TROPONIN I (HIGH SENSITIVITY)    EKG EKG Interpretation  Date/Time:  Friday May 13 2022 12:16:53 EDT Ventricular Rate:  84 PR Interval:  148 QRS Duration: 100 QT Interval:  360 QTC Calculation: 425 R Axis:   -48 Text Interpretation: Normal sinus rhythm Left axis deviation Abnormal ECG When compared with ECG of 13-Dec-2021 12:26, PREVIOUS ECG IS PRESENT No acute changes Confirmed by Derwood Kaplan ([id number]) on 05/13/2022 2:05:52 PM  Radiology DG Chest Portable 1 View  Result Date: 05/13/2022 CLINICAL DATA:  sob EXAM: PORTABLE CHEST 1 VIEW COMPARISON:  December 28, 2021 FINDINGS: Again seen is the gross elevation of the right hemidiaphragm up to the upper-mid thoracic region, stable. Cardiomediastinal silhouette is enlarged, stable. Thoracic aorta is ectatic. Minor atelectasis at the left lung base and subjacent to the elevated right diaphragm. The visualized skeletal structures are unremarkable. IMPRESSION: Cardiomegaly and grossly elevated right hemidiaphragm are stable. Minor atelectasis in the lungs. Electronically Signed   By: Marjo Bicker M.D.   On: 05/13/2022 12:40    Procedures .Critical Care  Performed by: Derwood Kaplan, MD Authorized by: Derwood Kaplan, MD   Critical care provider statement:    Critical care time (minutes):  48   Critical care was necessary to treat or prevent imminent or life-threatening deterioration of the following conditions:  Respiratory failure   Critical care was time spent personally by me on the following activities:  Development of treatment plan with patient or surrogate, discussions with consultants, evaluation of patient's response to treatment, examination of patient, ordering and review of laboratory studies, ordering and review of radiographic studies, ordering and performing treatments and interventions, pulse oximetry, re-evaluation of patient's condition and review of old charts     Medications Ordered in  ED Medications - No data to display  ED Course/ Medical Decision Making/ A&P Clinical Course as of 05/13/22 1506  Fri May 13, 2022  1504 I-Stat venous blood gas, [facility] ED only)(!!) Mild hypercapnia with pH of 7.39.  Likely chronic hypercapnia.  BNP is normal.  Initial high-sensitivity troponin is also normal.  We will add CT chest without contrast at this time. [AN]    Clinical Course User Index [AN] Derwood Kaplan, MD                           Medical Decision Making Amount and/or Complexity of Data Reviewed Labs: ordered. Decision-making details documented in  ED Course. Radiology: ordered.  Risk Decision regarding hospitalization.   This patient presents to the ED with chief complaint(s) of shortness of breath with pertinent past medical history of mild pulmonary hypertension, mitral valve pathology, DVt/PE on Xarelto which further complicates the presenting complaint. The complaint involves an extensive differential diagnosis and also carries with it a high risk of complications and morbidity.    The differential diagnosis includes CVA 10 acute CHF with pulmonary edema, pleural effusion, pneumonia, COVID-19, severe anemia.  Patient noted to be hypoxic, O2 sats in the 70s on room air.  Patient states that he uses oxygen only with exercise normally.  The initial plan is to put patient on oxygen first, pulm on CPAP for work of the leg and get basic labs, chest x-ray.   Additional history obtained: Additional history obtained from family -sister-in-law Records reviewed previous admission documents and reviewed patient's cardiology note from earlier this year and right-sided coronary cath.  Independent labs interpretation:  The following labs were independently interpreted: Patient has elevated hemoglobin, likely recent constipation.  Also currently reactive secondary to chronic respiratory failure.  Independent visualization of imaging: - I independently visualized the  following imaging with scope of interpretation limited to determining acute life threatening conditions related to emergency care: X-ray of the chest, which revealed no evidence of pleural effusions and pulmonary edema.  No clear evidence of pneumonia.  Treatment and Reassessment: Patient placed on CPAP.  He is more comfortable appearing.   Consultation: - Consulted or discussed management/test interpretation w/ external professional: Discussed case with Dr. Shari Prows, cardiology.  It is unclear to [place] why patient is hypoxic.  He has mild pulmonary hypertension and moderate mitral valve disease.  She does not think cardiac etiology is most likely the cause, and shunting is less likely.  However, if patient's CT scan without contrast and COVID-19 test are negative, she recommends the medicine team consider consulting cardiology service.  Final Clinical Impression(s) / ED Diagnoses Final diagnoses:  Acute hypoxemic respiratory failure [facility])    Rx / DC Orders ED Discharge Orders     None         Derwood Kaplan, MD 05/13/22 1506

## 2022-05-13 NOTE — ED Notes (Signed)
Pt taken off of BiPap per RT for CT scan and placed on 6L [place]. Pt O2 maintaining 90-91%.

## 2022-05-13 NOTE — Telephone Encounter (Signed)
Called and spoke with Marylene Land letting her know that we were going to place order for pt to receive  replacement POC charger and she verbalized understanding. Order has been placed. Nothing further needed.

## 2022-05-13 NOTE — Assessment & Plan Note (Signed)
Currently normotensive, hold home antihypertensives for now.

## 2022-05-13 NOTE — Assessment & Plan Note (Signed)
Mild PAH Continue CPAP nightly.

## 2022-05-13 NOTE — Progress Notes (Signed)
Requested order for Lactate.

## 2022-05-13 NOTE — Assessment & Plan Note (Signed)
Multifocal pneumonia New resting hypoxia with SPO2 79% while on room air.  Currently requiring 10-11 L O2 via [place].  CT chest showed multilobar right-sided pneumonia.  COVID/flu negative. -Continue IV ceftriaxone and azithromycin -Continue supplemental oxygen as needed, wean as able -BiPAP if needed -Follow blood cultures, strep pneumonia urinary antigen

## 2022-05-13 NOTE — ED Notes (Signed)
Lab results was reported to Nurse. 

## 2022-05-13 NOTE — Telephone Encounter (Signed)
Ok for [employer] order

## 2022-05-13 NOTE — ED Notes (Signed)
Inititally 79% RA up to 94% on 6L [place]

## 2022-05-13 NOTE — Assessment & Plan Note (Addendum)
CT shows mid ascending thoracic aorta measuring 4.4 cm in greatest dimension.  Annual follow-up CTA or MRA is recommended.

## 2022-05-13 NOTE — ED Triage Notes (Signed)
Presents with sob, sinus headache and low oxygen level x 2 days.  Does not wear oxygen at home but suppose to use a CPAP.  Needing 3-4L [place] in triage due to sat at 79%

## 2022-05-14 DIAGNOSIS — J189 Pneumonia, unspecified organism: Secondary | ICD-10-CM | POA: Diagnosis not present

## 2022-05-14 DIAGNOSIS — Z86718 Personal history of other venous thrombosis and embolism: Secondary | ICD-10-CM | POA: Diagnosis not present

## 2022-05-14 DIAGNOSIS — J9621 Acute and chronic respiratory failure with hypoxia: Secondary | ICD-10-CM | POA: Diagnosis not present

## 2022-05-14 LAB — I-STAT ARTERIAL BLOOD GAS, ED
Acid-Base Excess: 10 mmol/L — ABNORMAL HIGH (ref 0.0–2.0)
Bicarbonate: 41.2 mmol/L — ABNORMAL HIGH (ref 20.0–28.0)
Calcium, Ion: 1.36 mmol/L (ref 1.15–1.40)
HCT: 49 % (ref 39.0–52.0)
Hemoglobin: 16.7 g/dL (ref 13.0–17.0)
O2 Saturation: 86 %
Patient temperature: 98.5
Potassium: 3.7 mmol/L (ref 3.5–5.1)
Sodium: 140 mmol/L (ref 135–145)
TCO2: 44 mmol/L — ABNORMAL HIGH (ref 22–32)
pCO2 arterial: 86.5 mmHg (ref 32–48)
pH, Arterial: 7.285 — ABNORMAL LOW (ref 7.35–7.45)
pO2, Arterial: 61 mmHg — ABNORMAL LOW (ref 83–108)

## 2022-05-14 LAB — POCT I-STAT 7, (LYTES, BLD GAS, ICA,H+H)
Acid-Base Excess: 11 mmol/L — ABNORMAL HIGH (ref 0.0–2.0)
Bicarbonate: 42.8 mmol/L — ABNORMAL HIGH (ref 20.0–28.0)
Calcium, Ion: 1.35 mmol/L (ref 1.15–1.40)
HCT: 49 % (ref 39.0–52.0)
Hemoglobin: 16.7 g/dL (ref 13.0–17.0)
O2 Saturation: 95 %
Patient temperature: 98.6
Potassium: 3.6 mmol/L (ref 3.5–5.1)
Sodium: 139 mmol/L (ref 135–145)
TCO2: 45 mmol/L — ABNORMAL HIGH (ref 22–32)
pCO2 arterial: 88.9 mmHg (ref 32–48)
pH, Arterial: 7.291 — ABNORMAL LOW (ref 7.35–7.45)
pO2, Arterial: 91 mmHg (ref 83–108)

## 2022-05-14 LAB — CBC
HCT: 51.4 % (ref 39.0–52.0)
Hemoglobin: 16.7 g/dL (ref 13.0–17.0)
MCH: 32.6 pg (ref 26.0–34.0)
MCHC: 32.5 g/dL (ref 30.0–36.0)
MCV: 100.4 fL — ABNORMAL HIGH (ref 80.0–100.0)
Platelets: 167 10*3/uL (ref 150–400)
RBC: 5.12 MIL/uL (ref 4.22–5.81)
RDW: 14.6 % (ref 11.5–15.5)
WBC: 7 10*3/uL (ref 4.0–10.5)
nRBC: 0 % (ref 0.0–0.2)

## 2022-05-14 LAB — GLUCOSE, CAPILLARY
Glucose-Capillary: 124 mg/dL — ABNORMAL HIGH (ref 70–99)
Glucose-Capillary: 101 mg/dL — ABNORMAL HIGH (ref 70–99)
Glucose-Capillary: 117 mg/dL — ABNORMAL HIGH (ref 70–99)

## 2022-05-14 LAB — BASIC METABOLIC PANEL
Anion gap: 7 (ref 5–15)
BUN: 10 mg/dL (ref 6–20)
CO2: 39 mmol/L — ABNORMAL HIGH (ref 22–32)
Calcium: 9.8 mg/dL (ref 8.9–10.3)
Chloride: 96 mmol/L — ABNORMAL LOW (ref 98–111)
Creatinine, Ser: 0.76 mg/dL (ref 0.61–1.24)
GFR, Estimated: >60 mL/min (ref >60)
Glucose, Bld: 123 mg/dL — ABNORMAL HIGH (ref 70–99)
Potassium: 3.8 mmol/L (ref 3.5–5.1)
Sodium: 142 mmol/L (ref 135–145)

## 2022-05-14 LAB — HIV ANTIBODY (ROUTINE TESTING W REFLEX): HIV Screen 4th Generation wRfx: NONREACTIVE

## 2022-05-14 LAB — MRSA NEXT GEN BY PCR, NASAL: MRSA by PCR Next Gen: NOT DETECTED

## 2022-05-14 LAB — STREP PNEUMONIAE URINARY ANTIGEN: Strep Pneumo Urinary Antigen: NEGATIVE

## 2022-05-14 LAB — PROCALCITONIN: Procalcitonin: <0.1 ng/mL

## 2022-05-14 MED ORDER — BUDESONIDE 0.25 MG/2ML IN SUSP
0.2500 mg | Freq: Two times a day (BID) | RESPIRATORY_TRACT | Status: DC
  Administered 2022-05-14 – 2022-05-17 (×7): 0.25 mg via RESPIRATORY_TRACT
  Filled 2022-05-14 (×7): qty 2

## 2022-05-14 MED ORDER — LIP MEDEX EX OINT
TOPICAL_OINTMENT | CUTANEOUS | Status: DC | PRN
  Filled 2022-05-14: qty 7

## 2022-05-14 MED ORDER — IPRATROPIUM-ALBUTEROL 0.5-2.5 (3) MG/3ML IN SOLN
3.0000 mL | RESPIRATORY_TRACT | Status: DC
  Administered 2022-05-14 (×3): 3 mL via RESPIRATORY_TRACT
  Filled 2022-05-14 (×3): qty 3

## 2022-05-14 MED ORDER — IPRATROPIUM-ALBUTEROL 0.5-2.5 (3) MG/3ML IN SOLN
3.0000 mL | RESPIRATORY_TRACT | Status: DC | PRN
  Administered 2022-05-17: 3 mL via RESPIRATORY_TRACT
  Filled 2022-05-14: qty 3

## 2022-05-14 MED ORDER — CHLORHEXIDINE GLUCONATE CLOTH 2 % EX PADS
6.0000 | MEDICATED_PAD | Freq: Every day | CUTANEOUS | Status: DC
  Administered 2022-05-14 – 2022-05-16 (×3): 6 via TOPICAL

## 2022-05-14 MED ORDER — SODIUM CHLORIDE 0.9 % IV SOLN: INTRAVENOUS | Status: DC | PRN

## 2022-05-14 MED ORDER — ARFORMOTEROL TARTRATE 15 MCG/2ML IN NEBU
15.0000 ug | INHALATION_SOLUTION | Freq: Two times a day (BID) | RESPIRATORY_TRACT | Status: DC
  Administered 2022-05-14 – 2022-05-17 (×7): 15 ug via RESPIRATORY_TRACT
  Filled 2022-05-14 (×7): qty 2

## 2022-05-14 NOTE — Progress Notes (Addendum)
RT increased RR on BiPAP to 22 and increased Ipap and Epap post ABG. RT to continue to monitor as needed

## 2022-05-14 NOTE — ED Notes (Signed)
Pt pale, diaphoretic. RT at bedside. MD Elgergawy notified.

## 2022-05-14 NOTE — ED Notes (Signed)
Patient c/o increased SOB. RR is labored, tachypneic, with accessory muscle use. Patient repositioned to sit up right in bed and provided with nebulizer treatment.pt states relief after interventions

## 2022-05-14 NOTE — Progress Notes (Signed)
ABG obtained while patient is on bipap of 16/7 with 10L bleed in.  Results given to MD.   Latest Reference Range & Units 05/14/22 09:16  Sample type  ARTERIAL  pH, Arterial 7.35 - 7.45  7.285 (L)  pCO2 arterial 32 - 48 mmHg 86.5 (HH)  pO2, Arterial 83 - 108 mmHg 61 (L)  TCO2 22 - 32 mmol/L 44 (H)  Acid-Base Excess 0.0 - 2.0 mmol/L 10.0 (H)  Bicarbonate 20.0 - 28.0 mmol/L 41.2 (H)  O2 Saturation % 86  Patient temperature  98.5 F  Collection site  RADIAL, ALLEN'S TEST ACCEPTABLE

## 2022-05-14 NOTE — ED Notes (Signed)
Breakfast order placed ?

## 2022-05-14 NOTE — ED Notes (Signed)
PT having multiple rounds of bigeminy and PVCs. MD [place]gerway paged.

## 2022-05-14 NOTE — Progress Notes (Signed)
PROGRESS NOTE    ESCHER Norris  AFB:[id number] DOB: 13-Jul-1961 DOA: 05/13/2022 PCP: Deatra James, MD    Chief Complaint  Patient presents with   Shortness of Breath    Brief Narrative:   Danny Norris is a 61 y.o. male with medical history significant for chronic respiratory failure with hypoxia (using supplemental O2 only with exertion), HTN, history of DVT on Xarelto, mild PAH, OSA on CPAP who presented to the ED for evaluation of hypoxia.   Patient reports progressive shortness of breath over the last 4 days.  He has had headache, sinus congestion, clear rhinorrhea.  He denies any significant cough.  He normally uses supplemental oxygen only with exertion but has been hypoxic at home with SPO2 down into the 70s.  He denies any nausea, vomiting, aspiration event.  He is generally fatigued.  He reports similar symptoms in pneumonia a year ago.     Initial vital showed BP 129/73, pulse 80, RR 16, temp 98.0 F, SPO2 79% on room air.  Patient was placed on 6 L O2 via [place] and subsequently placed on BiPAP.   Labs show WBC 6.3, hemoglobin 17.1, platelets 191,000, sodium 142, potassium 3.7, bicarb 36, BUN 12, creatinine 1.05, serum glucose 173, troponin 7, BNP 98.2.   SARS-CoV-2 and influenza PCR negative.   Portable chest x-ray shows enlarged cardiac silhouette with stable elevated right hemidiaphragm and minor atelectasis in the lungs.   CT chest without contrast shows consolidative opacity with air bronchograms at right medial lung base with peribronchial thickness and stranding at the bilateral perihilar regions.  Mid ascending thoracic aorta measures 4.4 cm in greatest dimension.  Main pulmonary artery measures 3.4 cm.   Patient started on IV ceftriaxone and azithromycin.  The hospitalist service was consulted to admit for further evaluation and management   Assessment & Plan:   Principal Problem:   Acute on chronic respiratory failure with hypoxia (HCC) Active Problems:   OSA  (obstructive sleep apnea)   Multifocal pneumonia   History of DVT (deep vein thrombosis)   HTN (hypertension)   Ascending aorta dilatation (HCC)   PNA (pneumonia)  Acute  respiratory failure with toxemia and hypercapnia (HCC) Multifocal pneumonia -With significant dyspnea, evidence of CO2 retention, started on BiPAP, desaturates intermittently. -CCM input greatly appreciated -Continue with IV Rocephin and azithromycin -As well significantly elevated right hemidiaphragm indicating poor reserve and creased risk for intubation   OSA (obstructive sleep apnea) Mild PAH He remains on BiPAP currently  Acute metabolic encephalopathy -Somnolent, due to CO2 retention   History of DVT (deep vein thrombosis) Previous on Eliquis, patient states now taking Xarelto.  Continue Xarelto.   HTN (hypertension) Currently normotensive, hold home antihypertensives for now.   Ascending aorta dilatation (HCC) CT shows mid ascending thoracic aorta measuring 4.4 cm in greatest dimension.  Annual follow-up CTA or MRA is recommended.    DVT prophylaxis: Xarelto Code Status: Full Family Communication: sister in law at bedside Disposition:   Status is: Inpatient    Consultants:  PCCM   Subjective:  Somnolent, but denies any complaints, go back to sleep right away, he had couple episodes of desaturation earlier today on BiPAP  Objective: Vitals:   05/14/22 1100 05/14/22 1101 05/14/22 1104 05/14/22 1115  BP:   (!) 145/80   Pulse:  77 78 76  Resp:  20 17 (!) 31  Temp:      TempSrc:      SpO2:  98% (!) 76% 98%  Weight: 117.9  kg     Height: 6' (1.829 m)       Intake/Output Summary (Last 24 hours) at 05/14/2022 1125 Last data filed at 05/14/2022 0932 Gross per 24 hour  Intake 348.63 ml  Output 400 ml  Net -51.37 ml   Filed Weights   05/13/22 1214 05/14/22 1100  Weight: 117.5 kg 117.9 kg    Examination:  Juliane, but wakes up and open eyes to loud verbal stimuli Diminished air  entry in the right lung, on BiPAP RRR,No Gallops,Rubs or new Murmurs, No Parasternal Heave +ve B.Sounds, Abd Soft, No tenderness, No rebound - guarding or rigidity. No Cyanosis, Clubbing or edema, No new Rash or bruise      Data Reviewed: I have personally reviewed following labs and imaging studies  CBC: Recent Labs  Lab 05/13/22 1222 05/13/22 1330 05/13/22 1654 05/14/22 0310 05/14/22 0916  WBC 6.3  --   --  7.0  --   NEUTROABS  --   --  4.5  --   --   HGB 17.1* 17.3*  --  16.7 16.7  HCT 52.9* 51.0  --  51.4 49.0  MCV 98.7  --   --  100.4*  --   PLT 191  --   --  167  --     Basic Metabolic Panel: Recent Labs  Lab 05/13/22 1222 05/13/22 1330 05/14/22 0310 05/14/22 0916  NA 142 141 142 140  K 3.7 3.9 3.8 3.7  CL 98  --  96*  --   CO2 36*  --  39*  --   GLUCOSE 173*  --  123*  --   BUN 12  --  10  --   CREATININE 1.05  --  0.76  --   CALCIUM 10.4*  --  9.8  --     GFR: Estimated Creatinine Clearance: 130.1 mL/min (by C-G formula based on SCr of 0.76 mg/dL).  Liver Function Tests: No results for input(s): "AST", "ALT", "ALKPHOS", "BILITOT", "PROT", "ALBUMIN" in the last 168 hours.  CBG: No results for input(s): "GLUCAP" in the last 168 hours.   Recent Results (from the past 240 hour(s))  Resp Panel by RT-PCR (Flu A&B, Covid) Anterior Nasal Swab     Status: None   Collection Time: 05/13/22 12:55 PM   Specimen: Anterior Nasal Swab  Result Value Ref Range Status   SARS Coronavirus 2 by RT PCR NEGATIVE NEGATIVE Final    Comment: (NOTE) SARS-CoV-2 target nucleic acids are NOT DETECTED.  The SARS-CoV-2 RNA is generally detectable in upper respiratory specimens during the acute phase of infection. The lowest concentration of SARS-CoV-2 viral copies this assay can detect is 138 copies/mL. A negative result does not preclude SARS-Cov-2 infection and should not be used as the sole basis for treatment or other patient management decisions. A negative result may  occur with  improper specimen collection/handling, submission of specimen other than nasopharyngeal swab, presence of viral mutation(s) within the areas targeted by this assay, and inadequate number of viral copies(<138 copies/mL). A negative result must be combined with clinical observations, patient history, and epidemiological information. The expected result is Negative.  Fact Sheet for Patients:  BloggerCourse.com  Fact Sheet for Healthcare Providers:  SeriousBroker.it  This test is no t yet approved or cleared by the [place] FDA and  has been authorized for detection and/or diagnosis of SARS-CoV-2 by FDA under an Emergency Use Authorization (EUA). This EUA will remain  in effect (meaning this test can be used)  for the duration of the COVID-19 declaration under Section 564(b)(1) of the Act, 21 U.S.C.section 360bbb-3(b)(1), unless the authorization is terminated  or revoked sooner.       Influenza A by PCR NEGATIVE NEGATIVE Final   Influenza B by PCR NEGATIVE NEGATIVE Final    Comment: (NOTE) The Xpert Xpress SARS-CoV-2/FLU/RSV plus assay is intended as an aid in the diagnosis of influenza from Nasopharyngeal swab specimens and should not be used as a sole basis for treatment. Nasal washings and aspirates are unacceptable for Xpert Xpress SARS-CoV-2/FLU/RSV testing.  Fact Sheet for Patients: BloggerCourse.com  Fact Sheet for Healthcare Providers: SeriousBroker.it  This test is not yet approved or cleared by the [place] FDA and has been authorized for detection and/or diagnosis of SARS-CoV-2 by FDA under an Emergency Use Authorization (EUA). This EUA will remain in effect (meaning this test can be used) for the duration of the COVID-19 declaration under Section 564(b)(1) of the Act, 21 U.S.C. section 360bbb-3(b)(1), unless the authorization is terminated  or revoked.  Performed at [facility], [address] [place], [place] [address]   Culture, blood (single)     Status: None (Preliminary result)   Collection Time: 05/13/22  4:54 PM   Specimen: BLOOD RIGHT HAND  Result Value Ref Range Status   Specimen Description BLOOD RIGHT HAND  Final   Special Requests   Final    BOTTLES DRAWN AEROBIC AND ANAEROBIC Blood Culture results may not be optimal due to an excessive volume of blood received in culture bottles   Culture   Final    NO GROWTH < 12 HOURS Performed at [facility], [address], [place], [place] [address]    Report Status PENDING  Incomplete         Radiology Studies: CT Chest Wo Contrast  Result Date: 05/13/2022 CLINICAL DATA:  Respiratory illness, nondiagnostic xray EXAM: CT CHEST WITHOUT CONTRAST TECHNIQUE: Multidetector CT imaging of the chest was performed following the standard protocol without IV contrast. RADIATION DOSE REDUCTION: This exam was performed according to the departmental dose-optimization program which includes automated exposure control, adjustment of the mA and/or kV according to patient size and/or use of iterative reconstruction technique. COMPARISON:  CT chest dated December 14, 2021 FINDINGS: Cardiovascular: Mid ascending thoracic aorta measures 4.4 cm in greatest dimension. No dissection or intramural hematoma seen. Main pulmonary artery measures 3.4 cm in greatest diameter. Heart is borderline. No pericardial effusion. Mediastinum/Nodes: No enlarged mediastinal or axillary lymph nodes. Thyroid gland, trachea, and esophagus demonstrate no significant findings. Lungs/Pleura: Again seen is the gross elevation of the right hemidiaphragm extending up to the mid thorax. There is consolidative opacity seen at the posterior right lung base with some air bronchograms and is more prominent in the present study. In addition, there is some peribronchial thickness and stranding seen at the right upper  lobe. There is peribronchial thickness and mild consolidative opacity, narrowing of the left upper and lower lobe bronchi seen. Left basilar atelectasis. Upper Abdomen: Cholelithiasis without cholecystitis. Musculoskeletal: 3.5 x 2.2 cm cystic structure in the superficial subcutaneous upper back in the midline. IMPRESSION: Gross elevation of the right hemidiaphragm stable. There is consolidative opacity with air bronchograms seen at the right medial lung base and is more prominent in the present study. In addition, there is peribronchial thickness and some stranding seen at the bilateral perihilar regions more prominent in the present study. These findings may represent multifocal pneumonia. No pleural effusion. Mid ascending thoracic aorta  measures 4.4 cm in greatest dimension. Recommend annual imaging followup by CTA or MRA. This recommendation follows 2010 ACCF/AHA/AATS/ACR/ASA/SCA/SCAI/SIR/STS/SVM Guidelines for the Diagnosis and Management of Patients with Thoracic Aortic Disease. Circulation. 2010; 121: D149-F026. Aortic aneurysm NOS (ICD10-I71.9) Main pulmonary artery measures 3.4 cm in greatest diameter and may reflect pulmonary hypertension. Electronically Signed   By: Marjo Bicker M.D.   On: 05/13/2022 15:17   DG Chest Portable 1 View  Result Date: 05/13/2022 CLINICAL DATA:  sob EXAM: PORTABLE CHEST 1 VIEW COMPARISON:  December 28, 2021 FINDINGS: Again seen is the gross elevation of the right hemidiaphragm up to the upper-mid thoracic region, stable. Cardiomediastinal silhouette is enlarged, stable. Thoracic aorta is ectatic. Minor atelectasis at the left lung base and subjacent to the elevated right diaphragm. The visualized skeletal structures are unremarkable. IMPRESSION: Cardiomegaly and grossly elevated right hemidiaphragm are stable. Minor atelectasis in the lungs. Electronically Signed   By: Marjo Bicker M.D.   On: 05/13/2022 12:40        Scheduled Meds:  atorvastatin  20 mg Oral q1800    Chlorhexidine Gluconate Cloth  6 each Topical Daily   ipratropium-albuterol  3 mL Nebulization Q4H   mometasone-formoterol  2 puff Inhalation BID   rivaroxaban  10 mg Oral Q supper   sodium chloride flush  3 mL Intravenous Q12H   Continuous Infusions:  azithromycin Stopped (05/13/22 1803)   cefTRIAXone (ROCEPHIN)  IV Stopped (05/13/22 1727)     LOS: 1 day       Huey Bienenstock, MD [facility]   To contact the attending provider between 7A-7P or the covering provider during after hours 7P-7A, please log into the web site [email or web address] and access using universal [place] password for that web site. If you do not have the password, please call the hospital operator.  05/14/2022, 11:25 AM

## 2022-05-14 NOTE — ED Notes (Signed)
Patient repositioned in bed and pulse ox changed to different site

## 2022-05-14 NOTE — Consult Note (Signed)
NAME:  Danny Norris, MRN:  [id number], DOB:  1961-04-10, LOS: 1 ADMISSION DATE:  05/13/2022, CONSULTATION DATE:  05/14/2022 REFERRING MD:  Dr. Randol Kern - [facility], CHIEF COMPLAINT:  Respiratory distress   History of Present Illness:  Danny Norris is a 61 y.o.a male with a PMH significant for dyslipidemia, hypertension, mitral valve regurgitation due to mitral valve prolapse, pulmonary nodule, elevated right hemidiaphragm and DVT who presented to the ED with shortness of breath, sinus headache, and hypoxia x2 days but began having URI symptoms 5 days prior to admission..  Patient does not utilize home oxygen at baseline.  On ED arrival patient was hemodynamically stable with slight tachypnea requiring supplemental oxygen with eventual up titration to BiPAP therapy.  Lab work significant for CO2 36, glucose 173, calcium 10.4 INR 1.7.  And WBC 6.3 ABG 7.285/86.5/61/41.2.  COVID and influenza swabs negative.  CT chest continues to reveal gross elevation of the right hemidiaphragm and questionable signs of multifocal pneumonia.  Was admitted per [facility] service for management of multifocal pneumonia given BiPAP requirements PCCM was also consulted.  Pertinent  Medical History  Dyslipidemia, hypertension, mitral valve regurgitation due to mitral valve prolapse, pulmonary nodule, elevated right hemidiaphragm and DVT  Significant Hospital Events: Including procedures, antibiotic start and stop dates in addition to other pertinent events   8/12 admitted for URI-like symptoms with sinus headache, shortness of breath and hypoxia.  Admitted for multifocal pneumonia with PCCM consulted due to BiPAP requirement   Interim History / Subjective:  Seen lying in bed on BIPAP in mild respiratory distress  Objective   Blood pressure (!) 103/59, pulse 72, temperature 98.5 F (36.9 C), temperature source Axillary, resp. rate (!) 26, height 6' (1.829 m), weight 117.5 kg, SpO2 92 %.    Vent Mode: PSV;BIPAP FiO2 (%):  [60  %] 60 % PEEP:  [8 cmH20] 8 cmH20 Pressure Support:  [10 cmH20] 10 cmH20   Intake/Output Summary (Last 24 hours) at 05/14/2022 1448 Last data filed at 05/14/2022 0932 Gross per 24 hour  Intake 348.63 ml  Output 400 ml  Net -51.37 ml   Filed Weights   05/13/22 1214  Weight: 117.5 kg    Examination: General: Acute on chronically ill appearing middle aged male lying n bed on BIPAP, in NAD HEENT: BIPAP mask in place, MM pink/moist, PERRL,  Neuro: Alert and interactive on BIPAP but mildly sleepy as well  CV: s1s2 regular rate and rhythm, no murmur, rubs, or gallops,  PULM:  Diminished breath sounds bilaterally, tolerating BIPAP, severe hypoxia without NIV GI: soft, bowel sounds active in all 4 quadrants, non-tender, non-distended Extremities: warm/dry, no edema  Skin: no rashes or lesions  Resolved Hospital Problem list     Assessment & Plan:  Acute hypoxic and hypercapnic respiratory failure with history of elevated hemidiaphragm  -Patient only utilizes supplemental oxygen during exertion like exercise  -Unsure cause or duration of elevated hemidiaphragm, he had a CXR in May 2009 and July 2012 that showed no elevation. First appearance of elevation was May of 2022 -ABG 7.285/86.5/61/41.2 Multifocal pneumonia - CT chest continues to reveal gross elevation of the right hemidiaphragm and questionable signs of multifocal pneumonia. Obstructive sleep apnea -Some issues with equipment at home  Mild pulmonary artery hypertension -Right heart cath performed March 2023 which revealed mean PA pressure 24, PC W11, PVR 2.1, with mild pulmonary hypertension in the setting of valvular disease and known OSA  P: Continue BIPAP with high suspicion for need of intubation  given low pulmoanry reserve  Head of bed elevated 30 degrees Follow intermittent chest x-ray and ABG Ensure adequate pulmonary hygiene  Follow cultures  Continue empiric antibiotics  Scheduled BDs   History of prior DVT   -Anticoagulated at baseline with Xarelto  P: Hold anticoagulation for now, if unable to take PO by tomorrow consider heparin drip   Hx of HTN/HLD  -Home medications include, Norvasc, HCTZ, Toprol-XL, and Lipitor  Ascending aortic dilation  -CT showing mid ascending thoracic aorta measuring 4.4cm  P: Resume home medications when able  PRN IV antihypertensives  Annual CT follow up   Best Practice (right click and "Reselect all SmartList Selections" daily)   Diet/type: NPO DVT prophylaxis: SCD GI prophylaxis: PPI Lines: N/A Foley:  N/A Code Status:  full code Last date of multidisciplinary goals of care discussion: Patient and Wife updated at bedside   Labs   CBC: Recent Labs  Lab 05/13/22 1222 05/13/22 1330 05/13/22 1654 05/14/22 0310 05/14/22 0916  WBC 6.3  --   --  7.0  --   NEUTROABS  --   --  4.5  --   --   HGB 17.1* 17.3*  --  16.7 16.7  HCT 52.9* 51.0  --  51.4 49.0  MCV 98.7  --   --  100.4*  --   PLT 191  --   --  167  --     Basic Metabolic Panel: Recent Labs  Lab 05/13/22 1222 05/13/22 1330 05/14/22 0310 05/14/22 0916  NA 142 141 142 140  K 3.7 3.9 3.8 3.7  CL 98  --  96*  --   CO2 36*  --  39*  --   GLUCOSE 173*  --  123*  --   BUN 12  --  10  --   CREATININE 1.05  --  0.76  --   CALCIUM 10.4*  --  9.8  --    GFR: Estimated Creatinine Clearance: 130 mL/min (by C-G formula based on SCr of 0.76 mg/dL). Recent Labs  Lab 05/13/22 1222 05/13/22 1926 05/14/22 0310  WBC 6.3  --  7.0  LATICACIDVEN  --  1.0  --     Liver Function Tests: No results for input(s): "AST", "ALT", "ALKPHOS", "BILITOT", "PROT", "ALBUMIN" in the last 168 hours. No results for input(s): "LIPASE", "AMYLASE" in the last 168 hours. No results for input(s): "AMMONIA" in the last 168 hours.  ABG    Component Value Date/Time   PHART 7.285 (L) 05/14/2022 0916   PCO2ART 86.5 (HH) 05/14/2022 0916   PO2ART 61 (L) 05/14/2022 0916   HCO3 41.2 (H) 05/14/2022 0916   TCO2 44  (H) 05/14/2022 0916   ACIDBASEDEF 1.0 12/20/2021 1430   O2SAT 86 05/14/2022 0916     Coagulation Profile: Recent Labs  Lab 05/13/22 1222  INR 1.7*    Cardiac Enzymes: No results for input(s): "CKTOTAL", "CKMB", "CKMBINDEX", "TROPONINI" in the last 168 hours.  HbA1C: Hgb A1c MFr Bld  Date/Time Value Ref Range Status  02/19/2021 03:23 PM 6.3 (H) 4.8 - 5.6 % Final    Comment:    (NOTE) Pre diabetes:          5.7%-6.4%  Diabetes:              >6.4%  Glycemic control for   <7.0% adults with diabetes     CBG: No results for input(s): "GLUCAP" in the last 168 hours.  Review of Systems:   Please see the history of  present illness. All other systems reviewed and are negative   Past Medical History:  He,  has a past medical history of Dyslipidemia, HTN (hypertension), Mitral valve regurgitation, and Normal coronary arteries.   Surgical History:   Past Surgical History:  Procedure Laterality Date   EYE SURGERY  1975   HERNIA REPAIR Right 2009   Rt inguinal repair   LEFT AND RIGHT HEART CATHETERIZATION WITH CORONARY ANGIOGRAM N/A 09/01/2011   Procedure: LEFT AND RIGHT HEART CATHETERIZATION WITH CORONARY ANGIOGRAM;  Surgeon: Lennette Bihari, MD;  Location: [facility] CATH LAB;  Service: Cardiovascular;  Laterality: N/A;   RIGHT HEART CATH N/A 12/20/2021   Procedure: RIGHT HEART CATH;  Surgeon: Dolores Patty, MD;  Location: [facility] INVASIVE CV LAB;  Service: Cardiovascular;  Laterality: N/A;     Social History:   reports that he has never smoked. He has never used smokeless tobacco. He reports that he does not drink alcohol.   Family History:  His family history includes Congestive Heart Failure in his mother; Sleep apnea in his brother.   Allergies Allergies  Allergen Reactions   Contrast Media [Iodinated Contrast Media] Rash     Home Medications  Prior to Admission medications   Medication Sig Start Date End Date Taking? Authorizing Provider  amLODipine (NORVASC) 5 MG  tablet Take 5 mg by mouth daily.   Yes [provider]  atorvastatin (LIPITOR) 20 MG tablet Take 1 tablet (20 mg total) by mouth daily at 6 PM. 09/17/19  Yes Lennette Bihari, MD  docusate sodium (COLACE) 100 MG capsule Take 100 mg by mouth daily as needed for mild constipation.   Yes [provider]  hydrochlorothiazide (HYDRODIURIL) 25 MG tablet TAKE 1 TABLET BY MOUTH EVERY DAY Patient taking differently: Take 25 mg by mouth daily. 03/25/20  Yes Lennette Bihari, MD  melatonin 5 MG TABS Take 5 mg by mouth at bedtime as needed (sleep).   Yes [provider]  potassium chloride SA (KLOR-CON M) 20 MEQ tablet Take 1 tablet (20 mEq total) by mouth daily. 12/20/21  Yes Bensimhon, Bevelyn Buckles, MD  XARELTO 10 MG TABS tablet Take 10 mg by mouth daily. 05/10/22  Yes [provider]  fluticasone-salmeterol (ADVAIR HFA) 115-21 MCG/ACT inhaler Inhale 2 puffs into the lungs 2 (two) times daily. Patient not taking: Reported on 05/13/2022 01/03/22   Martina Sinner, MD  metoprolol succinate (TOPROL-XL) 50 MG 24 hr tablet TAKE 1 TABLET BY MOUTH DAILY WITH OR IMMEDIATELY FOLLOWING A MEAL Patient taking differently: 50 mg daily. 03/25/20   Lennette Bihari, MD     Critical care time: 42 mins  Vander Kueker D. Tiburcio Pea, NP-[facility] Personal contact information can be found on Amion  05/14/2022, 10:28 AM

## 2022-05-14 NOTE — Progress Notes (Signed)
Pt would not tolerate CPAP, placed back on 10L salter. Sats 96%.

## 2022-05-14 NOTE — Progress Notes (Signed)
RT came to assess patient.  Upon arrival patient was noted to be on 11L [place] high flow and had increased work of breathing with prolonged expiratory phase.  Patient was placed on CPAP machine, but with bipap settings of IPAP: 16 and EPAP: 7 with a 10L bleed in.  Work of breathing has improved however patient's sats only 93%.  Patient also noted to be diaphoretic, BP is now 92/53, and having PVCs and bigeminy.  RN aware and has paged MD.  Will continue to monitor.

## 2022-05-15 DIAGNOSIS — J9621 Acute and chronic respiratory failure with hypoxia: Secondary | ICD-10-CM | POA: Diagnosis not present

## 2022-05-15 LAB — RESPIRATORY PANEL BY PCR

## 2022-05-15 LAB — CBC
HCT: 47.7 % (ref 39.0–52.0)
Hemoglobin: 15.7 g/dL (ref 13.0–17.0)
MCH: 32.4 pg (ref 26.0–34.0)
MCHC: 32.9 g/dL (ref 30.0–36.0)
MCV: 98.4 fL (ref 80.0–100.0)
Platelets: 149 10*3/uL — ABNORMAL LOW (ref 150–400)
RBC: 4.85 MIL/uL (ref 4.22–5.81)
RDW: 14.6 % (ref 11.5–15.5)
WBC: 6.4 10*3/uL (ref 4.0–10.5)
nRBC: 0 % (ref 0.0–0.2)

## 2022-05-15 LAB — BASIC METABOLIC PANEL
Anion gap: 8 (ref 5–15)
BUN: 10 mg/dL (ref 6–20)
CO2: 37 mmol/L — ABNORMAL HIGH (ref 22–32)
Calcium: 9.6 mg/dL (ref 8.9–10.3)
Chloride: 98 mmol/L (ref 98–111)
Creatinine, Ser: 0.81 mg/dL (ref 0.61–1.24)
GFR, Estimated: >60 mL/min (ref >60)
Glucose, Bld: 99 mg/dL (ref 70–99)
Potassium: 3.7 mmol/L (ref 3.5–5.1)
Sodium: 143 mmol/L (ref 135–145)

## 2022-05-15 LAB — PROCALCITONIN: Procalcitonin: <0.1 ng/mL

## 2022-05-15 NOTE — Plan of Care (Signed)

## 2022-05-15 NOTE — Progress Notes (Signed)
NAME:  Danny Norris, MRN:  [id number], DOB:  [birth year], LOS: 2 ADMISSION DATE:  05/13/2022, CONSULTATION DATE:  05/14/2022 REFERRING MD:  Dr. Randol Kern - [facility], CHIEF COMPLAINT:  Respiratory distress   History of Present Illness:  Danny Norris is a 61 y.o.a male with a PMH significant for dyslipidemia, hypertension, mitral valve regurgitation due to mitral valve prolapse, pulmonary nodule, elevated right hemidiaphragm and DVT who presented to the ED with shortness of breath, sinus headache, and hypoxia x2 days but began having URI symptoms 5 days prior to admission..  Patient does not utilize home oxygen at baseline.  On ED arrival patient was hemodynamically stable with slight tachypnea requiring supplemental oxygen with eventual up titration to BiPAP therapy.  Lab work significant for CO2 36, glucose 173, calcium 10.4 INR 1.7.  And WBC 6.3 ABG 7.285/86.5/61/41.2.  COVID and influenza swabs negative.  CT chest continues to reveal gross elevation of the right hemidiaphragm and questionable signs of multifocal pneumonia.  Was admitted per [facility] service for management of multifocal pneumonia given BiPAP requirements PCCM was also consulted.  Pertinent  Medical History  Dyslipidemia, hypertension, mitral valve regurgitation due to mitral valve prolapse, pulmonary nodule, elevated right hemidiaphragm and DVT  Significant Hospital Events: Including procedures, antibiotic start and stop dates in addition to other pertinent events   8/12 admitted for URI-like symptoms with sinus headache, shortness of breath and hypoxia.  Admitted for multifocal pneumonia with PCCM consulted due to BiPAP requirement  8/13 Feeling improved,awake and alert on Bipap, denies dyspnea, trial HFNC, on Ceftriaxone/Azithromycin  Interim History / Subjective:  Awake on Bipap, no distress, respiratory distress improved  Objective   Blood pressure 120/73, pulse (!) 50, temperature 98.3 F (36.8 C), temperature source Axillary,  resp. rate 19, height 6' (1.829 m), weight 117.9 kg, SpO2 97 %.    Vent Mode: BIPAP;PCV FiO2 (%):  [40 %-70 %] 40 % Set Rate:  [20 bmp-22 bmp] 20 bmp PEEP:  [6 cmH20-8 cmH20] 8 cmH20 Pressure Support:  [8 cmH20] 8 cmH20   Intake/Output Summary (Last 24 hours) at 05/15/2022 0731 Last data filed at 05/15/2022 0400 Gross per 24 hour  Intake 443.54 ml  Output 575 ml  Net -131.46 ml    Filed Weights   05/13/22 1214 05/14/22 1100  Weight: 117.5 kg 117.9 kg      General:  chronically ill-appearing M, resting in bed in no distress HEENT: MM pink/moist, bipap mask in place Neuro: awake, alert, oriented x3 CV: s1s2 rrr, no m/r/g PULM:  good air movement without significant rhonchi or wheezing GI: soft, non-tender Extremities: warm/dry, trace pre-tibial edema  Skin: no rashes or lesions    Significant Labs Platelets 149 Bicarb 37 Procal remains <0.10  Resolved Hospital Problem list     Assessment & Plan:    Acute hypoxic and hypercapnic respiratory failure secondary to acute likely viral respiratory infection with history of elevated hemidiaphragm  -Patient only utilizes supplemental oxygen during exertion like exercise  -Unsure cause or duration of elevated hemidiaphragm, he had a CXR in May 2009 and July 2012 that showed no elevation. First appearance of elevation was May of 2022 -initial ABG 7.285/86.5/61/41.2 -if continues to improve can likely transfer out of critical care today  Multifocal pneumonia Obstructive sleep apnea Mild pulmonary artery hypertension - CT chest continues to reveal gross elevation of the right hemidiaphragm and questionable signs of multifocal pneumonia. -Some issues with equipment at home  -Right heart cath performed March 2023 which revealed mean  PA pressure 24, PC W11, PVR 2.1, with mild pulmonary hypertension in the setting of valvular disease and known OSA  P: Admitted to ICU with concern for possible intubation, however has improved  overnight with Bipap, trial HFNC Head of bed elevated 30 degrees Follow intermittent chest x-ray and ABG Ensure adequate pulmonary hygiene  Cultures negative thus far Continue empiric antibiotics with Ceftriaxone and Azithromycin Scheduled BDs   History of prior DVT  -Anticoagulated at baseline with Xarelto  P: Resume Xarelto  Hx of HTN/HLD  -Home medications include, Norvasc, HCTZ, Toprol-XL, and Lipitor  Ascending aortic dilation  -CT showing mid ascending thoracic aorta measuring 4.4cm  P: Resume home medications when able  PRN IV antihypertensives  Annual CT follow up   Best Practice (right click and "Reselect all SmartList Selections" daily)   Diet/type: Regular consistency (see orders) DVT prophylaxis: DOAC GI prophylaxis: PPI Lines: N/A Foley:  N/A Code Status:  full code Last date of multidisciplinary goals of care discussion: Patient and Wife updated at bedside 8/13  Labs   CBC: Recent Labs  Lab 05/13/22 1222 05/13/22 1330 05/13/22 1654 05/14/22 0310 05/14/22 0916 05/14/22 1212 05/15/22 0044  WBC 6.3  --   --  7.0  --   --  6.4  NEUTROABS  --   --  4.5  --   --   --   --   HGB 17.1* 17.3*  --  16.7 16.7 16.7 15.7  HCT 52.9* 51.0  --  51.4 49.0 49.0 47.7  MCV 98.7  --   --  100.4*  --   --  98.4  PLT 191  --   --  167  --   --  149*     Basic Metabolic Panel: Recent Labs  Lab 05/13/22 1222 05/13/22 1330 05/14/22 0310 05/14/22 0916 05/14/22 1212 05/15/22 0614  NA 142 141 142 140 139 143  K 3.7 3.9 3.8 3.7 3.6 3.7  CL 98  --  96*  --   --  98  CO2 36*  --  39*  --   --  37*  GLUCOSE 173*  --  123*  --   --  99  BUN 12  --  10  --   --  10  CREATININE 1.05  --  0.76  --   --  0.81  CALCIUM 10.4*  --  9.8  --   --  9.6    GFR: Estimated Creatinine Clearance: 128.5 mL/min (by C-G formula based on SCr of 0.81 mg/dL). Recent Labs  Lab 05/13/22 1222 05/13/22 1926 05/14/22 0310 05/15/22 0044  PROCALCITON  --   --  <0.10  --   WBC 6.3  --   7.0 6.4  LATICACIDVEN  --  1.0  --   --      Liver Function Tests: No results for input(s): "AST", "ALT", "ALKPHOS", "BILITOT", "PROT", "ALBUMIN" in the last 168 hours. No results for input(s): "LIPASE", "AMYLASE" in the last 168 hours. No results for input(s): "AMMONIA" in the last 168 hours.  ABG    Component Value Date/Time   PHART 7.291 (L) 05/14/2022 1212   PCO2ART 88.9 (HH) 05/14/2022 1212   PO2ART 91 05/14/2022 1212   HCO3 42.8 (H) 05/14/2022 1212   TCO2 45 (H) 05/14/2022 1212   ACIDBASEDEF 1.0 12/20/2021 1430   O2SAT 95 05/14/2022 1212     Coagulation Profile: Recent Labs  Lab 05/13/22 1222  INR 1.7*     Cardiac Enzymes: No results  for input(s): "CKTOTAL", "CKMB", "CKMBINDEX", "TROPONINI" in the last 168 hours.  HbA1C: Hgb A1c MFr Bld  Date/Time Value Ref Range Status  02/19/2021 03:23 PM 6.3 (H) 4.8 - 5.6 % Final    Comment:    (NOTE) Pre diabetes:          5.7%-6.4%  Diabetes:              >6.4%  Glycemic control for   <7.0% adults with diabetes     CBG: Recent Labs  Lab 05/14/22 1136 05/14/22 1558 05/14/22 1907  GLUCAP 124* 101* 117*    Review of Systems:   Please see the history of present illness. All other systems reviewed and are negative   Past Medical History:  He,  has a past medical history of Dyslipidemia, HTN (hypertension), Mitral valve regurgitation, and Normal coronary arteries.   Surgical History:   Past Surgical History:  Procedure Laterality Date   EYE SURGERY  1975   HERNIA REPAIR Right 2009   Rt inguinal repair   LEFT AND RIGHT HEART CATHETERIZATION WITH CORONARY ANGIOGRAM N/A 09/01/2011   Procedure: LEFT AND RIGHT HEART CATHETERIZATION WITH CORONARY ANGIOGRAM;  Surgeon: Lennette Bihari, MD;  Location: [facility] CATH LAB;  Service: Cardiovascular;  Laterality: N/A;   RIGHT HEART CATH N/A 12/20/2021   Procedure: RIGHT HEART CATH;  Surgeon: Dolores Patty, MD;  Location: [facility] INVASIVE CV LAB;  Service: Cardiovascular;   Laterality: N/A;     Social History:   reports that he has never smoked. He has never used smokeless tobacco. He reports that he does not drink alcohol.   Family History:  His family history includes Congestive Heart Failure in his mother; Sleep apnea in his brother.   Allergies Allergies  Allergen Reactions   Contrast Media [Iodinated Contrast Media] Rash     Home Medications  Prior to Admission medications   Medication Sig Start Date End Date Taking? Authorizing Provider  amLODipine (NORVASC) 5 MG tablet Take 5 mg by mouth daily.   Yes [provider]  atorvastatin (LIPITOR) 20 MG tablet Take 1 tablet (20 mg total) by mouth daily at 6 PM. 09/17/19  Yes Lennette Bihari, MD  docusate sodium (COLACE) 100 MG capsule Take 100 mg by mouth daily as needed for mild constipation.   Yes [provider]  hydrochlorothiazide (HYDRODIURIL) 25 MG tablet TAKE 1 TABLET BY MOUTH EVERY DAY Patient taking differently: Take 25 mg by mouth daily. 03/25/20  Yes Lennette Bihari, MD  melatonin 5 MG TABS Take 5 mg by mouth at bedtime as needed (sleep).   Yes [provider]  potassium chloride SA (KLOR-CON M) 20 MEQ tablet Take 1 tablet (20 mEq total) by mouth daily. 12/20/21  Yes Bensimhon, Bevelyn Buckles, MD  XARELTO 10 MG TABS tablet Take 10 mg by mouth daily. 05/10/22  Yes [provider]  fluticasone-salmeterol (ADVAIR HFA) 115-21 MCG/ACT inhaler Inhale 2 puffs into the lungs 2 (two) times daily. Patient not taking: Reported on 05/13/2022 01/03/22   Martina Sinner, MD  metoprolol succinate (TOPROL-XL) 50 MG 24 hr tablet TAKE 1 TABLET BY MOUTH DAILY WITH OR IMMEDIATELY FOLLOWING A MEAL Patient taking differently: 50 mg daily. 03/25/20   Lennette Bihari, MD     Critical care time: 42 mins  Whitney D. Tiburcio Pea, NP-[facility] Personal contact information can be found on Amion  05/15/2022, 7:31 AM

## 2022-05-16 DIAGNOSIS — J9621 Acute and chronic respiratory failure with hypoxia: Secondary | ICD-10-CM | POA: Diagnosis not present

## 2022-05-16 DIAGNOSIS — I10 Essential (primary) hypertension: Secondary | ICD-10-CM

## 2022-05-16 DIAGNOSIS — I77810 Thoracic aortic ectasia: Secondary | ICD-10-CM | POA: Diagnosis not present

## 2022-05-16 DIAGNOSIS — Z86718 Personal history of other venous thrombosis and embolism: Secondary | ICD-10-CM | POA: Diagnosis not present

## 2022-05-16 DIAGNOSIS — G4733 Obstructive sleep apnea (adult) (pediatric): Secondary | ICD-10-CM

## 2022-05-16 LAB — LEGIONELLA PNEUMOPHILA SEROGP 1 UR AG: L. pneumophila Serogp 1 Ur Ag: NEGATIVE

## 2022-05-16 LAB — CBC
HCT: 47.9 % (ref 39.0–52.0)
Hemoglobin: 15.6 g/dL (ref 13.0–17.0)
MCH: 32 pg (ref 26.0–34.0)
MCHC: 32.6 g/dL (ref 30.0–36.0)
MCV: 98.2 fL (ref 80.0–100.0)
Platelets: 158 10*3/uL (ref 150–400)
RBC: 4.88 MIL/uL (ref 4.22–5.81)
RDW: 14.7 % (ref 11.5–15.5)
WBC: 5 10*3/uL (ref 4.0–10.5)
nRBC: 0 % (ref 0.0–0.2)

## 2022-05-16 LAB — BASIC METABOLIC PANEL
Anion gap: 9 (ref 5–15)
BUN: 11 mg/dL (ref 6–20)
CO2: 36 mmol/L — ABNORMAL HIGH (ref 22–32)
Calcium: 9.8 mg/dL (ref 8.9–10.3)
Chloride: 98 mmol/L (ref 98–111)
Creatinine, Ser: 0.97 mg/dL (ref 0.61–1.24)
GFR, Estimated: >60 mL/min (ref >60)
Glucose, Bld: 115 mg/dL — ABNORMAL HIGH (ref 70–99)
Potassium: 3.4 mmol/L — ABNORMAL LOW (ref 3.5–5.1)
Sodium: 143 mmol/L (ref 135–145)

## 2022-05-16 LAB — PROCALCITONIN: Procalcitonin: <0.1 ng/mL

## 2022-05-16 MED ORDER — POTASSIUM CHLORIDE CRYS ER 20 MEQ PO TBCR
40.0000 meq | EXTENDED_RELEASE_TABLET | Freq: Once | ORAL | Status: AC
  Administered 2022-05-16: 40 meq via ORAL
  Filled 2022-05-16: qty 2

## 2022-05-16 MED ORDER — AMLODIPINE BESYLATE 5 MG PO TABS
5.0000 mg | ORAL_TABLET | Freq: Every day | ORAL | Status: DC
Start: 2022-05-16 — End: 2022-05-17
  Administered 2022-05-16 – 2022-05-17 (×2): 5 mg via ORAL
  Filled 2022-05-16 (×2): qty 1

## 2022-05-16 NOTE — Progress Notes (Addendum)
Progress Note  Patient: Danny Norris [id number] DOB: August 06, 1961  DOA: 05/13/2022  DOS: 05/16/2022    Brief hospital course: Danny Norris is a 61 y.o. 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 Initially requiring BiPAP, PCCM was consulted, antibiotics continued.   Assessment and Plan: Acute on chronic respiratory failure with hypoxia due to multifocal pneumonia: Covid, RVP negative. - Continue CAP abx coverage x5 days. PCT reassuring. MRSA PCR negative. - Wean oxygen as tolerated, currently well above his home baseline and hasn't gotten OOB yet. Will also get PT/OT consults.   -Follow blood cultures (NG3D), sputum culture (no result), strep pneumonia urinary antigen  OSA, PAH (WHO II, III) Continue CPAP nightly.  History of DVT: - Continue DOAC  HTN: BP rising - Restart home norvasc.  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  - Annual follow-up CTA or MRA is recommended.  Hypokalemia:  - Supplement and monitor in AM.  Obesity: Estimated body mass index is 34.42 kg/m as calculated from the following:   Height as of this encounter: 6' (1.829 m).   Weight as of this encounter: 115.1 kg.   Subjective: Feeling his breathing is improved. No chest pain, fever, or other new complaints.   Reports protracted recovery from pneumonia last year and still needing some oxygen intermittently since that time, took a long time to rehab.   Objective: Vitals:   05/16/22 0821 05/16/22 0926 05/16/22 0927 05/16/22 1522  BP:    (!) 153/94  Pulse:  86 83 88  Resp:      Temp:      TempSrc:      SpO2: 91% (!) 88% 92% 90%  Weight:      Height:       Gen: Chronically  ill-appearing, pleasant 61 y.o. male in no distress Pulm: Nonlabored breathing supplemental oxygen, no wheezes, diminished at right > left bases. CV: Regular rate and rhythm. No murmur, rub, or gallop. No JVD, no significant dependent edema. GI: Abdomen soft, non-tender, non-distended, with normoactive bowel sounds.  Ext: Warm, no deformities Skin: No rashes, lesions or ulcers on visualized skin. Nontender lipoma near midline upper back. Neuro: Alert and oriented. No focal neurological deficits. Psych: Judgement and insight appear fair. Mood euthymic & affect congruent. Behavior is appropriate.    Data Personally reviewed: CBC: Recent Labs  Lab 05/13/22 1222 05/13/22 1330 05/13/22 1654 05/14/22 0310 05/14/22 0916 05/14/22 1212 05/15/22 0044 05/16/22 0615  WBC 6.3  --   --  7.0  --   --  6.4 5.0  NEUTROABS  --   --  4.5  --   --   --   --   --   HGB 17.1*   < >  --  16.7 16.7 16.7 15.7 15.6  HCT 52.9*   < >  --  51.4 49.0 49.0 47.7 47.9  MCV 98.7  --   --  100.4*  --   --  98.4 98.2  PLT 191  --   --  167  --   --  149* 158   < > = values in this interval not displayed.   Basic Metabolic Panel: Recent Labs  Lab 05/13/22 1222 05/13/22 1330 05/14/22 0310 05/14/22 [id number]  05/14/22 1212 05/15/22 0614 05/16/22 0615  NA 142   < > 142 140 139 143 143  K 3.7   < > 3.8 3.7 3.6 3.7 3.4*  CL 98  --  96*  --   --  98 98  CO2 36*  --  39*  --   --  37* 36*  GLUCOSE 173*  --  123*  --   --  99 115*  BUN 12  --  10  --   --  10 11  CREATININE 1.05  --  0.76  --   --  0.81 0.97  CALCIUM 10.4*  --  9.8  --   --  9.6 9.8   < > = values in this interval not displayed.   GFR: Estimated Creatinine Clearance: 106.1 mL/min (by C-G formula based on SCr of 0.97 mg/dL). Liver Function Tests: No results for input(s): "AST", "ALT", "ALKPHOS", "BILITOT", "PROT", "ALBUMIN" in the last 168 hours. No results for input(s): "LIPASE", "AMYLASE" in the last 168 hours. No results for input(s): "AMMONIA"  in the last 168 hours. Coagulation Profile: Recent Labs  Lab 05/13/22 1222  INR 1.7*   Cardiac Enzymes: No results for input(s): "CKTOTAL", "CKMB", "CKMBINDEX", "TROPONINI" in the last 168 hours. BNP (last 3 results) No results for input(s): "PROBNP" in the last 8760 hours. HbA1C: No results for input(s): "HGBA1C" in the last 72 hours. CBG: Recent Labs  Lab 05/14/22 1136 05/14/22 1558 05/14/22 1907  GLUCAP 124* 101* 117*   Lipid Profile: No results for input(s): "CHOL", "HDL", "LDLCALC", "TRIG", "CHOLHDL", "LDLDIRECT" in the last 72 hours. Thyroid Function Tests: No results for input(s): "TSH", "T4TOTAL", "FREET4", "T3FREE", "THYROIDAB" in the last 72 hours. Anemia Panel: No results for input(s): "VITAMINB12", "FOLATE", "FERRITIN", "TIBC", "IRON", "RETICCTPCT" in the last 72 hours. Urine analysis:    Component Value Date/Time   COLORURINE YELLOW 02/26/2008 1015   APPEARANCEUR CLEAR 02/26/2008 1015   LABSPEC 1.013 02/26/2008 1015   PHURINE 7.0 02/26/2008 1015   GLUCOSEU NEGATIVE 02/26/2008 1015   HGBUR MODERATE (A) 02/26/2008 1015   BILIRUBINUR NEGATIVE 02/26/2008 1015   KETONESUR NEGATIVE 02/26/2008 1015   PROTEINUR NEGATIVE 02/26/2008 1015   UROBILINOGEN 0.2 02/26/2008 1015   NITRITE NEGATIVE 02/26/2008 1015   LEUKOCYTESUR NEGATIVE 02/26/2008 1015   Recent Results (from the past 240 hour(s))  Resp Panel by RT-PCR (Flu A&B, Covid) Anterior Nasal Swab     Status: None   Collection Time: 05/13/22 12:55 PM   Specimen: Anterior Nasal Swab  Result Value Ref Range Status   SARS Coronavirus 2 by RT PCR NEGATIVE NEGATIVE Final    Comment: (NOTE) SARS-CoV-2 target nucleic acids are NOT DETECTED.  The SARS-CoV-2 RNA is generally detectable in upper respiratory specimens during the acute phase of infection. The lowest concentration of SARS-CoV-2 viral copies this assay can detect is 138 copies/mL. A negative result does not preclude SARS-Cov-2 infection and should not be  used as the sole basis for treatment or other patient management decisions. A negative result may occur with  improper specimen collection/handling, submission of specimen other than nasopharyngeal swab, presence of viral mutation(s) within the areas targeted by this assay, and inadequate number of viral copies(<138 copies/mL). A negative result must be combined with clinical observations, patient history, and epidemiological information. The expected result is Negative.  Fact Sheet for Patients:  BloggerCourse.com  Fact Sheet for Healthcare Providers:  SeriousBroker.it  This test is no t yet approved or cleared by the [place] and  has been authorized for detection and/or diagnosis of SARS-CoV-2 by FDA under an Emergency Use Authorization (EUA). This EUA will remain  in effect (meaning this test can be used) for the duration of the COVID-19 declaration under Section 564(b)(1) of the Act, 21 U.S.C.section 360bbb-3(b)(1), unless the authorization is terminated  or revoked sooner.       Influenza A by PCR NEGATIVE NEGATIVE Final   Influenza B by PCR NEGATIVE NEGATIVE Final    Comment: (NOTE) The Xpert Xpress SARS-CoV-2/FLU/RSV plus assay is intended as an aid in the diagnosis of influenza from Nasopharyngeal swab specimens and should not be used as a sole basis for treatment. Nasal washings and aspirates are unacceptable for Xpert Xpress SARS-CoV-2/FLU/RSV testing.  Fact Sheet for Patients: EntrepreneurPulse.com.au  Fact Sheet for Healthcare Providers: IncredibleEmployment.be  This test is not yet approved or cleared by the [place] FDA and has been authorized for detection and/or diagnosis of SARS-CoV-2 by FDA under an Emergency Use Authorization (EUA). This EUA will remain in effect (meaning this test can be used) for the duration of the COVID-19 declaration under Section  564(b)(1) of the Act, 21 U.S.C. section 360bbb-3(b)(1), unless the authorization is terminated or revoked.  Performed at [facility], [place] [address], [place], [place] [address]   Culture, blood (single)     Status: None (Preliminary result)   Collection Time: 05/13/22  4:54 PM   Specimen: BLOOD RIGHT HAND  Result Value Ref Range Status   Specimen Description BLOOD RIGHT HAND  Final   Special Requests   Final    BOTTLES DRAWN AEROBIC AND ANAEROBIC Blood Culture results may not be optimal due to an excessive volume of blood received in culture bottles   Culture   Final    NO GROWTH 3 DAYS Performed at [facility], [place] [address], [place], [place] [address]    Report Status PENDING  Incomplete  MRSA Next Gen by PCR, Nasal     Status: None   Collection Time: 05/14/22 11:08 AM   Specimen: Nasal Mucosa; Nasal Swab  Result Value Ref Range Status   MRSA by PCR Next Gen NOT DETECTED NOT DETECTED Final    Comment: (NOTE) The GeneXpert MRSA Assay (FDA approved for NASAL specimens only), is one component of a comprehensive MRSA colonization surveillance program. It is not intended to diagnose MRSA infection nor to guide or monitor treatment for MRSA infections. Test performance is not FDA approved in patients less than 50 years old. Performed at [facility], [place] [address] [place], [place] [address]   Respiratory (~20 pathogens) panel by PCR     Status: None   Collection Time: 05/15/22  9:37 AM   Specimen: Nasopharyngeal Swab; Respiratory  Result Value Ref Range Status   Adenovirus NOT DETECTED NOT DETECTED Final   Coronavirus 229E NOT DETECTED NOT DETECTED Final    Comment: (NOTE) The Coronavirus on the Respiratory Panel, DOES NOT test for the novel  Coronavirus (2019 nCoV)    Coronavirus HKU1 NOT DETECTED NOT DETECTED Final   Coronavirus NL63 NOT DETECTED NOT DETECTED Final   Coronavirus OC43 NOT DETECTED NOT DETECTED Final   Metapneumovirus NOT DETECTED NOT  DETECTED Final   Rhinovirus / Enterovirus NOT DETECTED NOT DETECTED Final   Influenza A NOT DETECTED NOT DETECTED Final   Influenza B NOT DETECTED NOT DETECTED Final   Parainfluenza Virus 1 NOT DETECTED NOT DETECTED Final   Parainfluenza Virus 2 NOT DETECTED NOT DETECTED Final  Parainfluenza Virus 3 NOT DETECTED NOT DETECTED Final   Parainfluenza Virus 4 NOT DETECTED NOT DETECTED Final   Respiratory Syncytial Virus NOT DETECTED NOT DETECTED Final   Bordetella pertussis NOT DETECTED NOT DETECTED Final   Bordetella Parapertussis NOT DETECTED NOT DETECTED Final   Chlamydophila pneumoniae NOT DETECTED NOT DETECTED Final   Mycoplasma pneumoniae NOT DETECTED NOT DETECTED Final    Comment: Performed at [facility], [address] [place], [place] [address]     Family Communication: Spouse at bedside  Disposition: Status is: Inpatient Remains inpatient appropriate because: Continue treatment of acute hypoxic respiratory failure Planned Discharge Destination: Home      Tyrone Nine, MD 05/16/2022 6:29 PM Page by [email or web address]

## 2022-05-16 NOTE — Progress Notes (Signed)
Additional PT Note   SATURATION QUALIFICATIONS: (This note is used to comply with regulatory documentation for home oxygen)  Patient Saturations on Room Air at Rest = 85%  Patient Saturations on Room Air while Ambulating = did not perform since O2 sat <88% at rest. 86% on 2L O2 while ambulating.   Patient Saturations on 3 Liters of oxygen while Ambulating = 94%  Please briefly explain why patient needs home oxygen:Pt with desaturation below 88% when on RA and further desaturation when exerting himself.   Lyanne Co, PT  Acute Rehab Services Secure chat preferred Office [phone]

## 2022-05-16 NOTE — Evaluation (Signed)
Physical Therapy Evaluation Patient Details Name: Danny Norris MRN: [id number] DOB: [birth year] Today's Date: 05/16/2022  History of Present Illness  Patient is 61 yo male presenting to the ED with SOB, headache, and low O2 levels on 05/13/22. Admitted same day with aute on chronic respiratory failure with hypoxia and pneumonia.  PMHx significant for HTN, dyslipidemia, ascending aorta dilation, and DVT  Clinical Impression  Pt admitted with above diagnosis. Pt from home with wife and multiple other family members including elderly parents. Due to this they have not run their air condition through the summer. Discussed this situation in relation to his pulmonary function and that he will need to be in a cooler environment when he returns home. Pt with O2 desat to 85% on RA. Dropped to 88% with ambulation on 2L, remained low 90's when raised to 3 L. On 2 L pt able to recover into 90's in 1 min of standing rest. Currently recommending O2 for home but will follow to monitor.  Pt currently with functional limitations due to the deficits listed below (see PT Problem List). Pt will benefit from skilled PT to increase their independence and safety with mobility to allow discharge to the venue listed below.          Recommendations for follow up therapy are one component of a multi-disciplinary discharge planning process, led by the attending physician.  Recommendations may be updated based on patient status, additional functional criteria and insurance authorization.  Follow Up Recommendations No PT follow up      Assistance Recommended at Discharge Intermittent Supervision/Assistance  Patient can return home with the following  Help with stairs or ramp for entrance;Assistance with cooking/housework    Equipment Recommendations None recommended by PT  Recommendations for Other Services       Functional Status Assessment Patient has had a recent decline in their functional status and demonstrates  the ability to make significant improvements in function in a reasonable and predictable amount of time.     Precautions / Restrictions Precautions Precautions: Fall Precaution Comments: watch O2 Restrictions Weight Bearing Restrictions: No      Mobility  Bed Mobility               General bed mobility comments: pt received EOB    Transfers Overall transfer level: Needs assistance Equipment used: None Transfers: Sit to/from Stand Sit to Stand: Supervision           General transfer comment: pt steady with sit>stand    Ambulation/Gait Ambulation/Gait assistance: Min guard Gait Distance (Feet): 200 Feet Assistive device: None Gait Pattern/deviations: WFL(Within Functional Limits) Gait velocity: WFL Gait velocity interpretation: >2.62 ft/sec, indicative of community ambulatory   General Gait Details: SPO2 dropped 85% on RA. Returned to 96% on 2L O2 in 1 min. No LOB. 2/4 DOE with increased WOB  Stairs            Wheelchair Mobility    Modified Rankin (Stroke Patients Only)       Balance Overall balance assessment: Mild deficits observed, not formally tested                                           Pertinent Vitals/Pain Pain Assessment Pain Assessment: No/denies pain    Home Living Family/patient expects to be discharged to:: Private residence Living Arrangements: Spouse/significant other (Parents and brothers wife) Available Help at Discharge:  Family;Available 24 hours/day Type of Home: House Home Access: Stairs to enter Entrance Stairs-Rails: Can reach both Entrance Stairs-Number of Steps: 4 Alternate Level Stairs-Number of Steps: 10 steps, a landing then 5 more steps Home Layout: Two level;Laundry or work area in [employer] Equipment: Agricultural consultant (2 wheels);Cane - single point;BSC/3in1;Tub bench;Grab bars - toilet;Grab bars - tub/shower Additional Comments: of note, air conditioning in home has not been running  this summer because parents are always cold    Prior Function Prior Level of Function : Independent/Modified Independent;Working/employed;Driving             Mobility Comments: independent without device and driving. Works at [employer] x 30+ yrs ADLs Comments: independent     Higher education careers adviser Dominance   Dominant Hand: Right    Extremity/Trunk Assessment   Upper Extremity Assessment Upper Extremity Assessment: Defer to OT evaluation    Lower Extremity Assessment Lower Extremity Assessment: Overall WFL for tasks assessed    Cervical / Trunk Assessment Cervical / Trunk Assessment: Normal  Communication   Communication: No difficulties  Cognition Arousal/Alertness: Awake/alert Behavior During Therapy: WFL for tasks assessed/performed Overall Cognitive Status: Within Functional Limits for tasks assessed                                          General Comments General comments (skin integrity, edema, etc.): practiced IS use    Exercises     Assessment/Plan    PT Assessment Patient needs continued PT services  PT Problem List Decreased activity tolerance;Decreased mobility;Cardiopulmonary status limiting activity       PT Treatment Interventions Gait training;Stair training;Functional mobility training;Therapeutic activities;Therapeutic exercise;Balance training;Patient/family education    PT Goals (Current goals can be found in the Care Plan section)  Acute Rehab PT Goals Patient Stated Goal: return home PT Goal Formulation: With patient/family Time For Goal Achievement: 05/30/22 Potential to Achieve Goals: Good    Frequency Min 3X/week     Co-evaluation               AM-PAC PT "6 Clicks" Mobility  Outcome Measure Help needed turning from your back to your side while in a flat bed without using bedrails?: None Help needed moving from lying on your back to sitting on the side of a flat bed without using bedrails?: None Help needed moving to and  from a bed to a chair (including a wheelchair)?: None Help needed standing up from a chair using your arms (e.g., wheelchair or bedside chair)?: A Little Help needed to walk in hospital room?: A Little Help needed climbing 3-5 steps with a railing? : A Little 6 Click Score: 21    End of Session Equipment Utilized During Treatment: Gait belt Activity Tolerance: Patient tolerated treatment well Patient left: in bed;with call bell/phone within reach;with family/visitor present Nurse Communication: Mobility status PT Visit Diagnosis: Difficulty in walking, not elsewhere classified (R26.2)    Time: 1401-1430 PT Time Calculation (min) (ACUTE ONLY): 29 min   Charges:   PT Evaluation $PT Eval Moderate Complexity: 1 Mod PT Treatments $Gait Training: 8-22 mins        Lyanne Co, PT  Acute Rehab Services Secure chat preferred Office [phone]   Lawana Chambers Felicitas Sine 05/16/2022, 3:08 PM

## 2022-05-16 NOTE — Progress Notes (Signed)
Pt from home with medical history significant for chronic respiratory failure with hypoxia (using supplemental O2 only with exertion), HTN, history of DVT on Xarelto, mild PAH, OSA on CPAP who presented to the ED for evaluation of hypoxia. Acute on chronic respiratory failure with hypoxia and multifocal pneumonia. PT ordered. TOC will follow for potential needs pending PT rec.

## 2022-05-16 NOTE — Progress Notes (Signed)
Mobility Specialist Progress Note:   05/16/22 1100  Mobility  Activity Ambulated with assistance in hallway  Level of Assistance Modified independent, requires aide device or extra time  Assistive Device None  Distance Ambulated (ft) 800 ft  Activity Response Tolerated well  $Mobility charge 1 Mobility   Pt received in bed willing to participate in mobility. No complaints of pain. Left in bed with call bell in reach and all needs met.   [facility] Endi Lagman Mobility Specialist

## 2022-05-16 NOTE — Evaluation (Signed)
Occupational Therapy Evaluation Patient Details Name: Danny Norris MRN: [id number] DOB: [birth year] Today's Date: 05/16/2022   History of Present Illness Patient is 60 yo male presenting to the ED with SOB, headache, and low O2 levels on 05/13/22. Admitted same day with aute on chronic respiratory failure with hypoxia and pneumonia.  PMHx significant for HTN, dyslipidemia, ascending aorta dilation, and DVT   Clinical Impression   Prior to this admission, patient was living independently providing care to his parents, and still working and driving. Currently, patient is supervision for transfers, ad set up for ADLs. Patient is currently requiring 2L O2 in order to maintain appropriate saturations and education with regard to pursed lip breathing. Education provided to patient with regard to incentive spirometer with device provided at end of session. Good teach back and demonstrating noted at end of session. OT anticipating no need for follow up OT at discharge, but OT will continue to follow acutely.      Recommendations for follow up therapy are one component of a multi-disciplinary discharge planning process, led by the attending physician.  Recommendations may be updated based on patient status, additional functional criteria and insurance authorization.   Follow Up Recommendations  No OT follow up    Assistance Recommended at Discharge PRN  Patient can return home with the following A little help with walking and/or transfers;Assistance with cooking/housework;Assist for transportation;Help with stairs or ramp for entrance;Other (comment) (Assist for transportation intermittently)    Functional Status Assessment  Patient has had a recent decline in their functional status and demonstrates the ability to make significant improvements in function in a reasonable and predictable amount of time.  Equipment Recommendations  None recommended by OT    Recommendations for Other Services        Precautions / Restrictions Precautions Precautions: Fall Precaution Comments: watch O2 Restrictions Weight Bearing Restrictions: No      Mobility Bed Mobility Overal bed mobility: Modified Independent                  Transfers Overall transfer level: Needs assistance Equipment used: None Transfers: Sit to/from Stand Sit to Stand: Supervision                  Balance Overall balance assessment: Mild deficits observed, not formally tested                                         ADL either performed or assessed with clinical judgement   ADL Overall ADL's : Needs assistance/impaired Eating/Feeding: Set up;Sitting   Grooming: Set up;Sitting   Upper Body Bathing: Set up;Sitting   Lower Body Bathing: Set up;Sitting/lateral leans;Sit to/from stand   Upper Body Dressing : Set up;Sitting   Lower Body Dressing: Set up;Sit to/from stand;Sitting/lateral leans   Toilet Transfer: Set up;Ambulation   Toileting- Clothing Manipulation and Hygiene: Set up;Sitting/lateral lean;Sit to/from stand       Functional mobility during ADLs: Set up;Cueing for safety;Cueing for sequencing General ADL Comments: Patient presenting with decreased activity tolerance and need for cues for appropriate pacing and pursed lip breathing given need for increased oxygen     Vision Baseline Vision/History: 1 Wears glasses Ability to See in Adequate Light: 0 Adequate Patient Visual Report: No change from baseline       Perception     Praxis      Pertinent Vitals/Pain Pain Assessment Pain  Assessment: No/denies pain     Hand Dominance     Extremity/Trunk Assessment Upper Extremity Assessment Upper Extremity Assessment: Generalized weakness   Lower Extremity Assessment Lower Extremity Assessment: Defer to PT evaluation   Cervical / Trunk Assessment Cervical / Trunk Assessment: Normal   Communication Communication Communication: No difficulties    Cognition Arousal/Alertness: Awake/alert Behavior During Therapy: WFL for tasks assessed/performed Overall Cognitive Status: Within Functional Limits for tasks assessed                                       General Comments  Patient on 2L, O2 ranging from 88-96% HR ranging 100-110    Exercises     Shoulder Instructions      Home Living Family/patient expects to be discharged to:: Private residence Living Arrangements: Spouse/significant other (Parents and brothers wife) Available Help at Discharge: Family;Available 24 hours/day Type of Home: House Home Access: Stairs to enter [employer] of Steps: 4 Entrance Stairs-Rails: Can reach both Home Layout: Two level;Laundry or work area in Building surveyor of Steps: 10 steps, a landing then 5 more steps Alternate Level Stairs-Rails: Can reach both Bathroom Shower/Tub: Tub/shower unit;Curtain   Biochemist, clinical: Standard     Home Equipment: Conservation officer, nature (2 wheels);Cane - single point;BSC/3in1;Tub bench;Grab bars - toilet;Grab bars - tub/shower   Additional Comments: Patient does not need, but has all equipment for his mother      Prior Functioning/Environment Prior Level of Function : Independent/Modified Independent;Working/employed;Driving             Mobility Comments: independent without device and driving ADLs Comments: independent        OT Problem List: Decreased strength;Decreased activity tolerance;Impaired balance (sitting and/or standing);Cardiopulmonary status limiting activity;Decreased knowledge of precautions      OT Treatment/Interventions: Self-care/ADL training;Therapeutic exercise;Energy conservation;DME and/or AE instruction;Manual therapy;Therapeutic activities;Patient/family education;Balance training    OT Goals(Current goals can be found in the care plan section) Acute Rehab OT Goals Patient Stated Goal: to get better OT Goal Formulation: With  patient/family Time For Goal Achievement: 05/30/22 Potential to Achieve Goals: Good ADL Goals Pt Will Transfer to Toilet: Independently;ambulating Pt Will Perform Toileting - Clothing Manipulation and hygiene: Independently Additional ADL Goal #1: Patient will be able to verbalize 3 strategies for energy conservation and fall prevention in preparation for safe discharge home. Additional ADL Goal #2: Patient will be able to complete functional task in standing for 5-7 minutes without need for a rest break or without need for increased oxygen in preparation for return to work.  OT Frequency: Min 2X/week    Co-evaluation              AM-PAC OT "6 Clicks" Daily Activity     Outcome Measure Help from another person eating meals?: A Little Help from another person taking care of personal grooming?: A Little Help from another person toileting, which includes using toliet, bedpan, or urinal?: A Little Help from another person bathing (including washing, rinsing, drying)?: A Little Help from another person to put on and taking off regular upper body clothing?: A Little Help from another person to put on and taking off regular lower body clothing?: A Little 6 Click Score: 18   End of Session Nurse Communication: Mobility status  Activity Tolerance: Patient tolerated treatment well Patient left: in bed;with call bell/phone within reach;with family/visitor present  OT Visit Diagnosis: Unsteadiness on feet (R26.81);Muscle  weakness (generalized) (M62.81)                Time: 4656-8127 OT Time Calculation (min): 22 min Charges:  OT General Charges $OT Visit: 1 Visit OT Evaluation $OT Eval Moderate Complexity: 1 Mod  Pollyann Glen E. Lear Carstens, OTR/L Acute Rehabilitation Services [phone]   Cherlyn Cushing 05/16/2022, 3:01 PM

## 2022-05-16 NOTE — Progress Notes (Signed)
Mobility Specialist Progress Note:   05/16/22 1250  Mobility  Activity Ambulated with assistance in room;Ambulated with assistance to bathroom  Level of Assistance Modified independent, requires aide device or extra time  Assistive Device None  Distance Ambulated (ft) 40 ft  Activity Response Tolerated well  $Mobility charge 1 Mobility   Pt received in bed asking to go to bathroom. No complaints of pain. Left in bed with call bell in reach and all needs met.   [facility] Yousaf Sainato Mobility Specialist

## 2022-05-17 ENCOUNTER — Other Ambulatory Visit (HOSPITAL_COMMUNITY): Payer: Self-pay

## 2022-05-17 DIAGNOSIS — Z86718 Personal history of other venous thrombosis and embolism: Secondary | ICD-10-CM | POA: Diagnosis not present

## 2022-05-17 DIAGNOSIS — J9621 Acute and chronic respiratory failure with hypoxia: Secondary | ICD-10-CM | POA: Diagnosis not present

## 2022-05-17 DIAGNOSIS — I10 Essential (primary) hypertension: Secondary | ICD-10-CM | POA: Diagnosis not present

## 2022-05-17 DIAGNOSIS — I77810 Thoracic aortic ectasia: Secondary | ICD-10-CM | POA: Diagnosis not present

## 2022-05-17 LAB — BASIC METABOLIC PANEL
Anion gap: 5 (ref 5–15)
BUN: 8 mg/dL (ref 6–20)
CO2: 34 mmol/L — ABNORMAL HIGH (ref 22–32)
Calcium: 9.7 mg/dL (ref 8.9–10.3)
Chloride: 103 mmol/L (ref 98–111)
Creatinine, Ser: 0.79 mg/dL (ref 0.61–1.24)
GFR, Estimated: >60 mL/min (ref >60)
Glucose, Bld: 116 mg/dL — ABNORMAL HIGH (ref 70–99)
Potassium: 4 mmol/L (ref 3.5–5.1)
Sodium: 142 mmol/L (ref 135–145)

## 2022-05-17 MED ORDER — FLUTICASONE-SALMETEROL 115-21 MCG/ACT IN AERO
2.0000 | INHALATION_SPRAY | Freq: Two times a day (BID) | RESPIRATORY_TRACT | 0 refills | Status: DC
  Filled 2022-05-17: qty 8, 30d supply, fill #0

## 2022-05-17 MED ORDER — ALBUTEROL SULFATE HFA 108 (90 BASE) MCG/ACT IN AERS
2.0000 | INHALATION_SPRAY | Freq: Four times a day (QID) | RESPIRATORY_TRACT | 0 refills | Status: DC | PRN
  Filled 2022-05-17: qty 8.5, 15d supply, fill #0

## 2022-05-17 NOTE — TOC Transition Note (Signed)
Transition of Care [facility]) - CM/SW Discharge Note   Patient Details  Name: ANCHOR DWAN MRN: [id number] Date of Birth: 01-Aug-1961  [facility] CM/SW Contact:  Leone Haven, RN Phone Number: 05/17/2022, 11:51 AM   Clinical Narrative:    Patient is from home with family members, his SIL will be transporting him home, he will need home oxygen.  NCM asked him which company he wanted for DME , he states [employer].  NCM made referral to [facility] with [employer].  TOC to do meds also.    Final next level of care: Home/Self Care Barriers to Discharge: No Barriers Identified   Patient Goals and CMS Choice Patient states their goals for this hospitalization and ongoing recovery are:: return home      Discharge Placement                       Discharge Plan and Services                DME Arranged: Oxygen DME Agency: [employer] Date DME Agency Contacted: 05/17/22 Time DME Agency Contacted: [phone] Representative spoke with at DME Agency: Arnold Long            Social Determinants of Health (SDOH) Interventions     Readmission Risk Interventions     No data to display

## 2022-05-17 NOTE — Discharge Summary (Signed)
Physician Discharge Summary   Patient: Danny Norris MRN: [id number] DOB: 08/08/1961  Admit date:     05/13/2022  Discharge date: 05/17/22  Discharge Physician: Tyrone Nine   PCP: Danny James, MD   Recommendations at discharge:  Follow up with pulmonary, Dr. Francine Norris in 2-4 weeks. Consider repeat ambulatory pulse oximetry. Discharged on 2L O2 after admission for multifocal pneumonia.  Follow up as scheduled with cardiology 8/22.   Discharge Diagnoses: Principal Problem:   Acute on chronic respiratory failure with hypoxia (HCC) Active Problems:   OSA (obstructive sleep apnea)   Multifocal pneumonia   History of DVT (deep vein thrombosis)   HTN (hypertension)   Ascending aorta dilatation (HCC)   PNA (pneumonia)  Hospital Course: Danny Norris is a 61 y.o. male with medical history significant for chronic respiratory failure with hypoxia (using supplemental O2 only with exertion), HTN, history of DVT on Xarelto, mild PAH, OSA on CPAP who is admitted with acute on chronic respiratory failure with hypoxia due to multifocal pneumonia.  Plan-based hospital course: Acute on chronic respiratory failure with hypoxia due to multifocal pneumonia: Covid, RVP negative. - Completed CAP abx coverage x5 days. PCT reassuring. MRSA PCR negative. Symptomatically improved at time of discharge, ambulating with supplemental oxygen without significant dyspnea.   -Follow blood cultures (NG4D), sputum culture (no result), strep pneumonia urinary antigen - Refilled advair and provided prn albuterol, though pt not currently wheezing. Pulmonary f/u is needed.   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 - Has f/u with cardiology previously arranged on 8/22   History of DVT: - Continue DOAC   HTN: Continue home medications   Ascending aorta dilatation: 4.4 cm in greatest dimension by CT.  - Annual follow-up CTA or MRA is recommended.   Hypokalemia:  - Supplement and monitor in AM.   Obesity:  Estimated body mass index is 34.42 kg/m    Consultants: PCCM Procedures performed: None  Disposition: Home Diet recommendation:  Discharge Diet Orders (From admission, onward)     Start     Ordered   05/17/22 0000  Diet - low sodium heart healthy        05/17/22 1131          DISCHARGE MEDICATION: Allergies as of 05/17/2022       Reactions   Contrast Media [iodinated Contrast Media] Rash        Medication List     TAKE these medications    albuterol 108 (90 Base) MCG/ACT inhaler Commonly known as: VENTOLIN HFA Inhale 2 puffs into the lungs every 6 (six) hours as needed for wheezing or shortness of breath.   amLODipine 5 MG tablet Commonly known as: NORVASC Take 5 mg by mouth daily.   atorvastatin 20 MG tablet Commonly known as: LIPITOR Take 1 tablet (20 mg total) by mouth daily at 6 PM.   docusate sodium 100 MG capsule Commonly known as: COLACE Take 100 mg by mouth daily as needed for mild constipation.   fluticasone-salmeterol 115-21 MCG/ACT inhaler Commonly known as: Advair HFA Inhale 2 puffs into the lungs 2 (two) times daily.   hydrochlorothiazide 25 MG tablet Commonly known as: HYDRODIURIL TAKE 1 TABLET BY MOUTH EVERY DAY   melatonin 5 MG Tabs Take 5 mg by mouth at bedtime as needed (sleep).   metoprolol succinate 50 MG 24 hr tablet Commonly known as: TOPROL-XL TAKE 1 TABLET BY MOUTH DAILY WITH OR IMMEDIATELY FOLLOWING A MEAL What changed: See the new instructions.  potassium chloride SA 20 MEQ tablet Commonly known as: KLOR-CON M Take 1 tablet (20 mEq total) by mouth daily.   Xarelto 10 MG Tabs tablet Generic drug: rivaroxaban Take 10 mg by mouth daily.               Durable Medical Equipment  (From admission, onward)           Start     Ordered   05/17/22 1130  DME Oxygen  Once       Question Answer Comment  Length of Need 6 Months   Mode or (Route) Nasal cannula   Liters per Minute 2   Frequency Continuous  (stationary and portable oxygen unit needed)   Oxygen delivery system Gas      05/17/22 1131            Follow-up Information     Danny James, MD Follow up.   Specialty: Family Medicine Contact information: [phone] [address] [place] [address] [phone]         Martina Sinner, MD Follow up.   Specialty: Pulmonary Disease Contact information: [address] [place] [address] [phone]         Joylene Grapes, NP Follow up on 05/24/2022.   Specialties: Nurse Practitioner, Family Medicine Why: Follow up Contact information: [address] [place] [address] [phone]                Discharge Exam: Danny Norris Weights   05/15/22 1658 05/16/22 0546 05/17/22 0528  Weight: 117 kg 115.1 kg 115.6 kg  Nontoxic tall male in no distress Clear, diminished at right base with otherwise good air movement, no crackles, wheezes or rhonchi.  RRR, no MRG or pitting LE edema  Condition at discharge: stable  The results of significant diagnostics from this hospitalization (including imaging, microbiology, ancillary and laboratory) are listed below for reference.   Imaging Studies: CT Chest Wo Contrast  Result Date: 05/13/2022 CLINICAL DATA:  Respiratory illness, nondiagnostic xray EXAM: CT CHEST WITHOUT CONTRAST TECHNIQUE: Multidetector CT imaging of the chest was performed following the standard protocol without IV contrast. RADIATION DOSE REDUCTION: This exam was performed according to the departmental dose-optimization program which includes automated exposure control, adjustment of the mA and/or kV according to patient size and/or use of iterative reconstruction technique. COMPARISON:  CT chest dated December 14, 2021 FINDINGS: Cardiovascular: Mid ascending thoracic aorta measures 4.4 cm in greatest dimension. No dissection or intramural hematoma seen. Main pulmonary artery measures 3.4 cm in greatest diameter. Heart  is borderline. No pericardial effusion. Mediastinum/Nodes: No enlarged mediastinal or axillary lymph nodes. Thyroid gland, trachea, and esophagus demonstrate no significant findings. Lungs/Pleura: Again seen is the gross elevation of the right hemidiaphragm extending up to the mid thorax. There is consolidative opacity seen at the posterior right lung base with some air bronchograms and is more prominent in the present study. In addition, there is some peribronchial thickness and stranding seen at the right upper lobe. There is peribronchial thickness and mild consolidative opacity, narrowing of the left upper and lower lobe bronchi seen. Left basilar atelectasis. Upper Abdomen: Cholelithiasis without cholecystitis. Musculoskeletal: 3.5 x 2.2 cm cystic structure in the superficial subcutaneous upper back in the midline. IMPRESSION: Gross elevation of the right hemidiaphragm stable. There is consolidative opacity with air bronchograms seen at the right medial lung base and is more prominent in the present study. In addition, there is peribronchial thickness and some stranding  seen at the bilateral perihilar regions more prominent in the present study. These findings may represent multifocal pneumonia. No pleural effusion. Mid ascending thoracic aorta measures 4.4 cm in greatest dimension. Recommend annual imaging followup by CTA or MRA. This recommendation follows 2010 ACCF/AHA/AATS/ACR/ASA/SCA/SCAI/SIR/STS/SVM Guidelines for the Diagnosis and Management of Patients with Thoracic Aortic Disease. Circulation. 2010; 121: N829-F621: E266-e369. Aortic aneurysm NOS (ICD10-I71.9) Main pulmonary artery measures 3.4 cm in greatest diameter and may reflect pulmonary hypertension. Electronically Signed   By: Marjo BickerAmar  Amaresh M.D.   On: 05/13/2022 15:17   DG Chest Portable 1 View  Result Date: 05/13/2022 CLINICAL DATA:  sob EXAM: PORTABLE CHEST 1 VIEW COMPARISON:  December 28, 2021 FINDINGS: Again seen is the gross elevation of the right  hemidiaphragm up to the upper-mid thoracic region, stable. Cardiomediastinal silhouette is enlarged, stable. Thoracic aorta is ectatic. Minor atelectasis at the left lung base and subjacent to the elevated right diaphragm. The visualized skeletal structures are unremarkable. IMPRESSION: Cardiomegaly and grossly elevated right hemidiaphragm are stable. Minor atelectasis in the lungs. Electronically Signed   By: Marjo BickerAmar  Amaresh M.D.   On: 05/13/2022 12:40    Microbiology: Results for orders placed or performed during the hospital encounter of 05/13/22  Resp Panel by RT-PCR (Flu A&B, Covid) Anterior Nasal Swab     Status: None   Collection Time: 05/13/22 12:55 PM   Specimen: Anterior Nasal Swab  Result Value Ref Range Status   SARS Coronavirus 2 by RT PCR NEGATIVE NEGATIVE Final    Comment: (NOTE) SARS-CoV-2 target nucleic acids are NOT DETECTED.  The SARS-CoV-2 RNA is generally detectable in upper respiratory specimens during the acute phase of infection. The lowest concentration of SARS-CoV-2 viral copies this assay can detect is 138 copies/mL. A negative result does not preclude SARS-Cov-2 infection and should not be used as the sole basis for treatment or other patient management decisions. A negative result may occur with  improper specimen collection/handling, submission of specimen other than nasopharyngeal swab, presence of viral mutation(s) within the areas targeted by this assay, and inadequate number of viral copies(<138 copies/mL). A negative result must be combined with clinical observations, patient history, and epidemiological information. The expected result is Negative.  Fact Sheet for Patients:  BloggerCourse.comhttps://www.fda.gov/media/152166/download  Fact Sheet for Healthcare Providers:  SeriousBroker.ithttps://www.fda.gov/media/152162/download  This test is no t yet approved or cleared by the [place] FDA and  has been authorized for detection and/or diagnosis of SARS-CoV-2 by FDA under an  Emergency Use Authorization (EUA). This EUA will remain  in effect (meaning this test can be used) for the duration of the COVID-19 declaration under Section 564(b)(1) of the Act, 21 U.S.C.section 360bbb-3(b)(1), unless the authorization is terminated  or revoked sooner.       Influenza A by PCR NEGATIVE NEGATIVE Final   Influenza B by PCR NEGATIVE NEGATIVE Final    Comment: (NOTE) The Xpert Xpress SARS-CoV-2/FLU/RSV plus assay is intended as an aid in the diagnosis of influenza from Nasopharyngeal swab specimens and should not be used as a sole basis for treatment. Nasal washings and aspirates are unacceptable for Xpert Xpress SARS-CoV-2/FLU/RSV testing.  Fact Sheet for Patients: BloggerCourse.comhttps://www.fda.gov/media/152166/download  Fact Sheet for Healthcare Providers: SeriousBroker.ithttps://www.fda.gov/media/152162/download  This test is not yet approved or cleared by the [place] FDA and has been authorized for detection and/or diagnosis of SARS-CoV-2 by FDA under an Emergency Use Authorization (EUA). This EUA will remain in effect (meaning this test can be used) for the duration of the COVID-19 declaration under Section  564(b)(1) of the Act, 21 U.S.C. section 360bbb-3(b)(1), unless the authorization is terminated or revoked.  Performed at [facility], [address], [place], [place] [address]   Culture, blood (single)     Status: None (Preliminary result)   Collection Time: 05/13/22  4:54 PM   Specimen: BLOOD RIGHT HAND  Result Value Ref Range Status   Specimen Description BLOOD RIGHT HAND  Final   Special Requests   Final    BOTTLES DRAWN AEROBIC AND ANAEROBIC Blood Culture results may not be optimal due to an excessive volume of blood received in culture bottles   Culture   Final    NO GROWTH 4 DAYS Performed at [facility], [address] [place], [place] [address]    Report Status PENDING  Incomplete  MRSA Next Gen by PCR, Nasal     Status: None   Collection Time:  05/14/22 11:08 AM   Specimen: Nasal Mucosa; Nasal Swab  Result Value Ref Range Status   MRSA by PCR Next Gen NOT DETECTED NOT DETECTED Final    Comment: (NOTE) The GeneXpert MRSA Assay (FDA approved for NASAL specimens only), is one component of a comprehensive MRSA colonization surveillance program. It is not intended to diagnose MRSA infection nor to guide or monitor treatment for MRSA infections. Test performance is not FDA approved in patients less than 60 years old. Performed at [facility], [address], [place], [place] [address]   Respiratory (~20 pathogens) panel by PCR     Status: None   Collection Time: 05/15/22  9:37 AM   Specimen: Nasopharyngeal Swab; Respiratory  Result Value Ref Range Status   Adenovirus NOT DETECTED NOT DETECTED Final   Coronavirus 229E NOT DETECTED NOT DETECTED Final    Comment: (NOTE) The Coronavirus on the Respiratory Panel, DOES NOT test for the novel  Coronavirus (2019 nCoV)    Coronavirus HKU1 NOT DETECTED NOT DETECTED Final   Coronavirus NL63 NOT DETECTED NOT DETECTED Final   Coronavirus OC43 NOT DETECTED NOT DETECTED Final   Metapneumovirus NOT DETECTED NOT DETECTED Final   Rhinovirus / Enterovirus NOT DETECTED NOT DETECTED Final   Influenza A NOT DETECTED NOT DETECTED Final   Influenza B NOT DETECTED NOT DETECTED Final   Parainfluenza Virus 1 NOT DETECTED NOT DETECTED Final   Parainfluenza Virus 2 NOT DETECTED NOT DETECTED Final   Parainfluenza Virus 3 NOT DETECTED NOT DETECTED Final   Parainfluenza Virus 4 NOT DETECTED NOT DETECTED Final   Respiratory Syncytial Virus NOT DETECTED NOT DETECTED Final   Bordetella pertussis NOT DETECTED NOT DETECTED Final   Bordetella Parapertussis NOT DETECTED NOT DETECTED Final   Chlamydophila pneumoniae NOT DETECTED NOT DETECTED Final   Mycoplasma pneumoniae NOT DETECTED NOT DETECTED Final    Comment: Performed at [facility], [address], [place], [place] [address]     Labs: CBC: Recent Labs  Lab 05/13/22 1222 05/13/22 1330 05/13/22 1654 05/14/22 0310 05/14/22 0916 05/14/22 1212 05/15/22 0044 05/16/22 0615  WBC 6.3  --   --  7.0  --   --  6.4 5.0  NEUTROABS  --   --  4.5  --   --   --   --   --   HGB 17.1*   < >  --  16.7 16.7 16.7 15.7 15.6  HCT 52.9*   < >  --  51.4 49.0 49.0 47.7 47.9  MCV 98.7  --   --  100.4*  --   --  98.4 98.2  PLT 191  --   --  167  --   --  149* 158   < > = values in this interval not displayed.   Basic Metabolic Panel: Recent Labs  Lab 05/13/22 1222 05/13/22 1330 05/14/22 0310 05/14/22 0916 05/14/22 1212 05/15/22 0614 05/16/22 0615 05/17/22 0340  NA 142   < > 142 140 139 143 143 142  K 3.7   < > 3.8 3.7 3.6 3.7 3.4* 4.0  CL 98  --  96*  --   --  98 98 103  CO2 36*  --  39*  --   --  37* 36* 34*  GLUCOSE 173*  --  123*  --   --  99 115* 116*  BUN 12  --  10  --   --  10 11 8   CREATININE 1.05  --  0.76  --   --  0.81 0.97 0.79  CALCIUM 10.4*  --  9.8  --   --  9.6 9.8 9.7   < > = values in this interval not displayed.   Liver Function Tests: No results for input(s): "AST", "ALT", "ALKPHOS", "BILITOT", "PROT", "ALBUMIN" in the last 168 hours. CBG: Recent Labs  Lab 05/14/22 1136 05/14/22 1558 05/14/22 1907  GLUCAP 124* 101* 117*    Discharge time spent: greater than 30 minutes.  Signed: 07/14/22, MD [facility] 05/17/2022

## 2022-05-17 NOTE — Progress Notes (Signed)
Mobility Specialist Progress Note:   05/17/22 1149  Mobility  Activity Ambulated with assistance in hallway  Level of Assistance Independent after set-up  Assistive Device None  Distance Ambulated (ft) 1200 ft  Activity Response Tolerated well  $Mobility charge 1 Mobility   Pt received EOB willing to participate in mobility. No complaints of pain. Left EOB with call bell in reach and all needs met.   [facility] Theoren Palka Mobility Specialist

## 2022-05-18 LAB — CULTURE, BLOOD (SINGLE): Culture: NO GROWTH

## 2022-05-19 DIAGNOSIS — J9691 Respiratory failure, unspecified with hypoxia: Secondary | ICD-10-CM | POA: Diagnosis not present

## 2022-05-23 NOTE — Progress Notes (Unsigned)
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  Past Medical History    Past Medical History:  Diagnosis Date   Dyslipidemia    HTN (hypertension)    Mitral valve regurgitation    a. 05/2011: TEE showing significant MVP especially involving the P2 segment without evidence for ruptured chordae and had eccentric MR anteriorly directed. b. 03/2015: echo showing EF 65-70% with posterior MV leaflet thickening and prolapse with mild MR.   Normal coronary arteries    a. normal cors by cath in 08/2011   Past Surgical History:  Procedure Laterality Date   EYE SURGERY  1975   HERNIA REPAIR Right 2009   Rt inguinal repair   LEFT AND RIGHT HEART CATHETERIZATION WITH CORONARY ANGIOGRAM N/A 09/01/2011   Procedure: LEFT AND RIGHT HEART CATHETERIZATION WITH CORONARY ANGIOGRAM;  Surgeon: Lennette Bihari, MD;  Location: [facility] CATH LAB;  Service: Cardiovascular;  Laterality: N/A;   RIGHT HEART CATH N/A 12/20/2021   Procedure: RIGHT HEART CATH;  Surgeon: Dolores Patty, MD;  Location: [facility] INVASIVE CV LAB;  Service: Cardiovascular;  Laterality: N/A;    Allergies  Allergies  Allergen Reactions   Contrast Media [Iodinated Contrast Media] Rash    History of Present Illness    61 year old male with the above past medical history including pulmonary hypertension, mitral valve prolapse with mild mitral valve regurgitation, moderate aortic valve regurgitation, dilation of ascending aorta (4.4  cm), chronic hypoxemic respiratory failure, pulmonary nodule, previous bilateral DVT, hypertension, hyperlipidemia, OSA, and obesity.  Previously followed by Dr. Tresa Endo. Cardiac catheterization in 2012 showed normal coronary arteries.  He has a history of disease including mitral valve prolapse with mitral valve regurgitation, aortic valve regurgitation, monitored with serial echocardiograms.  He was hospitalized in May 2022 with acute respiratory failure.  He was intubated and found to have bilateral DVTs.  He is on chronic DOAC therapy.  He did not undergo CT with contrast at the time due to contrast allergy, no VQ scan was performed. Echo in May 2022 showed EF 65%, moderate LVH, normal RV systolic function, RVSP 25.5 mmHg, prolapse of the posterior MV leaflet with eccentric anterior MR difficult to quantify, mitral valve regurgitation, moderate aortic valve regurgitation.  He was referred to Dr. 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  RHC in March 2023 mild PAH with normal LV filling pressures.  VQ scan with low probability of PE. He was advised to follow-up with general cardiology. Unfortunately, he presented to the ED on 05/13/2022 with shortness of breath. He was hospitalized from 05/13/2022 to 05/17/2022 in the setting of acute on chronic respiratory failure with hypoxemia secondary to multifocal pneumonia.  He required supplemental oxygen.  He received IV antibiotics improvement in his symptoms.  He was advised to follow-up with cardiology and pulmonology as an outpatient.  He was discharged home in stable condition on 05/17/2022.  He presents today for follow-up accompanied by his sister-in-law.  Since his hospitalization he has done well from a cardiac standpoint. He is  exercising regularly, working on increasing his activity. He remains on home O2. He denies any worsening dyspnea. He notes occasional "twinges" in his chest that are  relieved with massage, denies any exertional symptoms concerning for angina. He has follow-up scheduled with his PCP today. Overall, he reports feeling better and denies any additional concerns today.   Home Medications    Current Outpatient Medications  Medication Sig Dispense Refill   albuterol (VENTOLIN HFA) 108 (90 Base) MCG/ACT inhaler Inhale 2 puffs into the lungs every 6 (six) hours as needed for wheezing or shortness of breath. 8.5 g 0   amLODipine (NORVASC) 5 MG tablet Take 5 mg by mouth daily.     atorvastatin (LIPITOR) 20 MG tablet Take 1 tablet (20 mg total) by mouth daily at 6 PM. 30 tablet 6   docusate sodium (COLACE) 100 MG capsule Take 100 mg by mouth daily as needed for mild constipation.     fluticasone-salmeterol (ADVAIR HFA) 115-21 MCG/ACT inhaler Inhale 2 puffs into the lungs 2 (two) times daily. 8 g 0   hydrochlorothiazide (HYDRODIURIL) 25 MG tablet TAKE 1 TABLET BY MOUTH EVERY DAY (Patient taking differently: Take 25 mg by mouth daily.) 90 tablet 1   melatonin 5 MG TABS Take 5 mg by mouth at bedtime as needed (sleep).     metoprolol succinate (TOPROL-XL) 50 MG 24 hr tablet TAKE 1 TABLET BY MOUTH DAILY WITH OR IMMEDIATELY FOLLOWING A MEAL (Patient taking differently: 50 mg daily.) 90 tablet 1   potassium chloride SA (KLOR-CON M) 20 MEQ tablet Take 1 tablet (20 mEq total) by mouth daily. 90 tablet 3   XARELTO 10 MG TABS tablet Take 10 mg by mouth daily.     No current facility-administered medications for this visit.     Review of Systems    He denies chest pain, palpitations, dyspnea, pnd, orthopnea, n, v, dizziness, syncope, edema, weight gain, or early satiety. All other systems reviewed and are otherwise negative except as noted above.   Physical Exam    VS:  BP 130/80   Pulse 71   Ht 6' (1.829 m)   Wt 251 lb 6.4 oz (114 kg)   SpO2 92%   BMI 34.10 kg/m   GEN: Well nourished, well developed, in no acute distress. HEENT: normal. Neck: Supple, no JVD, carotid  bruits, or masses. Cardiac: RRR, 3/6 murmur, no rubs, or gallops. No clubbing, cyanosis, edema.  Radials/DP/PT 2+ and equal bilaterally.  Respiratory:  Respirations regular and unlabored, clear to auscultation bilaterally. GI: Soft, nontender, nondistended, BS + x 4. MS: no deformity or atrophy. Skin: warm and dry, no rash. Neuro:  Strength and sensation are intact. Psych: Normal affect.  Accessory Clinical Findings    ECG personally reviewed by me today - No EKG in office today.   Lab Results  Component Value Date   WBC 5.0 05/16/2022   HGB 15.6 05/16/2022   HCT 47.9 05/16/2022   MCV 98.2 05/16/2022   PLT 158 05/16/2022   Lab Results  Component Value Date   CREATININE 0.79 05/17/2022   BUN 8 05/17/2022   NA 142 05/17/2022   K 4.0 05/17/2022   CL 103 05/17/2022   CO2 34 (H) 05/17/2022   Lab Results  Component Value Date   ALT 71 (H) 02/19/2021   AST 79 (H) 02/19/2021   ALKPHOS 64 02/19/2021   BILITOT 0.7 02/19/2021   Lab Results  Component Value Date   CHOL 132 12/24/2019   HDL 37 (L)  12/24/2019   LDLCALC 79 12/24/2019   TRIG 93 02/20/2021   CHOLHDL 3.6 12/24/2019    Lab Results  Component Value Date   HGBA1C 6.3 (H) 02/19/2021    Assessment & Plan    1. Pulmonary hypertension: RHC in March 2023 mild PAH with normal LV filling pressures.  VQ scan with low probability of PE.  Dr. Haroldine Laws, chronic dyspnea likely multifactorial in the setting of overall physical deconditioning and elevated right hemidiaphragm.  Continue to monitor symptoms.  2. Valvular heart disease:  Echo in May 2022 showed EF 65%, moderate LVH, normal RV systolic function, RVSP [id number] mmHg, prolapse of the posterior MV leaflet with eccentric anterior MR difficult to quantify, mitral valve regurgitation, moderate aortic valve regurgitation.  Consider repeat echocardiogram in 1 year, sooner if clinically indicated.  3. Dilation of ascending aorta (4.4 cm): Stable on most recent CT in 05/2022.   Recommend follow-up imaging in 1 year.   4. Chronic hypoxemic respiratory failure: Exacerbated in the setting of recent pneumonia.  Still requiring home O2, 2L.  Denies worsening dyspnea.  Following with pulmonology.   5. H/o bilateral DVT: Denies bleeding. Continue Xarelto.   6. Hypertension: BP well controlled. Continue current antihypertensive regimen.   7. Hyperlipidemia: LDL was 90 in 11/2021.  Continue atorvastatin.  8. OSA: Adherent to CPAP.    9. Disposition: Follow-up in 4-5 months with Dr. Claiborne Billings, sooner if needed.      Lenna Sciara, NP 05/24/2022, 12:28 PM

## 2022-05-24 ENCOUNTER — Ambulatory Visit (INDEPENDENT_AMBULATORY_CARE_PROVIDER_SITE_OTHER): Payer: BC Managed Care – PPO | Admitting: Nurse Practitioner

## 2022-05-24 ENCOUNTER — Encounter: Payer: Self-pay | Admitting: Nurse Practitioner

## 2022-05-24 VITALS — BP 130/80 | HR 71 | Ht 72.0 in | Wt 251.4 lb

## 2022-05-24 DIAGNOSIS — I10 Essential (primary) hypertension: Secondary | ICD-10-CM | POA: Diagnosis not present

## 2022-05-24 DIAGNOSIS — G4733 Obstructive sleep apnea (adult) (pediatric): Secondary | ICD-10-CM

## 2022-05-24 DIAGNOSIS — I351 Nonrheumatic aortic (valve) insufficiency: Secondary | ICD-10-CM | POA: Diagnosis not present

## 2022-05-24 DIAGNOSIS — R7303 Prediabetes: Secondary | ICD-10-CM | POA: Diagnosis not present

## 2022-05-24 DIAGNOSIS — Z86718 Personal history of other venous thrombosis and embolism: Secondary | ICD-10-CM

## 2022-05-24 DIAGNOSIS — I341 Nonrheumatic mitral (valve) prolapse: Secondary | ICD-10-CM

## 2022-05-24 DIAGNOSIS — E782 Mixed hyperlipidemia: Secondary | ICD-10-CM

## 2022-05-24 DIAGNOSIS — I2721 Secondary pulmonary arterial hypertension: Secondary | ICD-10-CM

## 2022-05-24 DIAGNOSIS — Z125 Encounter for screening for malignant neoplasm of prostate: Secondary | ICD-10-CM | POA: Diagnosis not present

## 2022-05-24 DIAGNOSIS — I77810 Thoracic aortic ectasia: Secondary | ICD-10-CM

## 2022-05-24 DIAGNOSIS — I340 Nonrheumatic mitral (valve) insufficiency: Secondary | ICD-10-CM | POA: Diagnosis not present

## 2022-05-24 DIAGNOSIS — Z0000 Encounter for general adult medical examination without abnormal findings: Secondary | ICD-10-CM | POA: Diagnosis not present

## 2022-05-24 DIAGNOSIS — J9611 Chronic respiratory failure with hypoxia: Secondary | ICD-10-CM

## 2022-05-24 DIAGNOSIS — E785 Hyperlipidemia, unspecified: Secondary | ICD-10-CM | POA: Diagnosis not present

## 2022-05-24 DIAGNOSIS — J189 Pneumonia, unspecified organism: Secondary | ICD-10-CM | POA: Diagnosis not present

## 2022-05-24 NOTE — Patient Instructions (Signed)
Medication Instructions:  Your physician recommends that you continue on your current medications as directed. Please refer to the Current Medication list given to you today.   *If you need a refill on your cardiac medications before your next appointment, please call your pharmacy*   Lab Work: NONE ordered at this time of appointment   If you have labs (blood work) drawn today and your tests are completely normal, you will receive your results only by: MyChart Message (if you have MyChart) OR A paper copy in the mail If you have any lab test that is abnormal or we need to change your treatment, we will call you to review the results.   Testing/Procedures: NONE ordered at this time of appointment     Follow-Up: At [facility], you and your health needs are our priority.  As part of our continuing mission to provide you with exceptional heart care, we have created designated Provider Care Teams.  These Care Teams include your primary Cardiologist (physician) and Advanced Practice Providers (APPs -  Physician Assistants and Nurse Practitioners) who all work together to provide you with the care you need, when you need it.  We recommend signing up for the patient portal called "MyChart".  Sign up information is provided on this After Visit Summary.  MyChart is used to connect with patients for Virtual Visits (Telemedicine).  Patients are able to view lab/test results, encounter notes, upcoming appointments, etc.  Non-urgent messages can be sent to your provider as well.   To learn more about what you can do with MyChart, go to [email or web address].    Your next appointment:   4-5 month(s)  The format for your next appointment:   In Person  Provider:  Dr. Nicki Guadalajara     Other Instructions   Important Information About Sugar

## 2022-05-30 DIAGNOSIS — G4733 Obstructive sleep apnea (adult) (pediatric): Secondary | ICD-10-CM | POA: Diagnosis not present

## 2022-06-09 ENCOUNTER — Other Ambulatory Visit (HOSPITAL_COMMUNITY): Payer: Self-pay

## 2022-06-09 ENCOUNTER — Ambulatory Visit (INDEPENDENT_AMBULATORY_CARE_PROVIDER_SITE_OTHER): Payer: BC Managed Care – PPO | Admitting: Pulmonary Disease

## 2022-06-09 ENCOUNTER — Telehealth: Payer: Self-pay

## 2022-06-09 ENCOUNTER — Encounter: Payer: Self-pay | Admitting: Pulmonary Disease

## 2022-06-09 VITALS — BP 120/68 | HR 63 | Temp 98.5°F | Ht 73.0 in | Wt 251.0 lb

## 2022-06-09 DIAGNOSIS — Z9989 Dependence on other enabling machines and devices: Secondary | ICD-10-CM

## 2022-06-09 DIAGNOSIS — J9611 Chronic respiratory failure with hypoxia: Secondary | ICD-10-CM

## 2022-06-09 DIAGNOSIS — J4530 Mild persistent asthma, uncomplicated: Secondary | ICD-10-CM

## 2022-06-09 DIAGNOSIS — G4733 Obstructive sleep apnea (adult) (pediatric): Secondary | ICD-10-CM | POA: Diagnosis not present

## 2022-06-09 DIAGNOSIS — I272 Pulmonary hypertension, unspecified: Secondary | ICD-10-CM | POA: Diagnosis not present

## 2022-06-09 DIAGNOSIS — J986 Disorders of diaphragm: Secondary | ICD-10-CM | POA: Diagnosis not present

## 2022-06-09 DIAGNOSIS — J4520 Mild intermittent asthma, uncomplicated: Secondary | ICD-10-CM

## 2022-06-09 MED ORDER — FLUTICASONE-SALMETEROL 115-21 MCG/ACT IN AERO: 2.0000 | INHALATION_SPRAY | Freq: Two times a day (BID) | RESPIRATORY_TRACT | 6 refills | Status: DC

## 2022-06-09 NOTE — Progress Notes (Signed)
Synopsis: Referred in January 2023 for respiratory failure  Subjective:   PATIENT ID: Danny Norris GENDER: male DOB: Mar 07, 1961, MRN: [id number]   HPI  Chief Complaint  Patient presents with   Hospitalization [facility] follow up from August. Pt was seen for acute resp failure. Pt states that he is feeling better since getting out of hospital. Pt ran out of Advair and is needing refills. Ran out yesterday. No new medication changes while in hospital.    Danny Norris is a 61 year old male, never smoker with history of hypertension, mitral valve regurgitation due to MV prolapse, pulmonary nodule, elevated right hemidiaphragm and DVT who returns to pulmonary clinic for chronic hypoxemic respiratory failure.   He was admitted 8/11 to 8/15 for acute on chronic respiratory failure due to pneumonia. He was seen by cardiology 8/22 for [facility]. He remains on 3L oxygen via POC. He desaturated on room air to 84% and placed back on 3L pulsed with saturations above 88%.   They have brought in paper work for disability today due to him being back on oxygen therapy.   OV 03/07/22 He was started on advair HFA 115-73mcg 2 puffs twice daily after last visit with improvement of his cough and wheezing.   He has on going seasonal allergy symptoms with sneezing.   OV 01/03/22 He had RHC on 12/20/21 which showed mean PA pressure 24, PCW 11, PVR 2.1 WU noting mild pulmonary hypertension and slight step up in right heart pressures. It was recommended to obtain an echo with bubble study to evaluate for shunt based on step up in pressures.  He reports increased wheezing and cough along with nasal congestion due to spring allergies.   PFTs today show mild restrictive defect and significant bronchodilator response.  OV 10/11/21 He will be completing pulmonary rehab tomorrow and reports he continues to use supplemental oxygen 2L with exercise while at rehab. Otherwise he is not using supplemental oxygen. He is  using CPAP therapy at night.   NM PET Scan for further workup of the left lower lobe lobe subpleural nodule noted on 03/08/21 CT Chest scan had resolved. No other concerning findings on the scan that was suspicious of hypermetabolic activity.   He remains on eliquis for DVT treatment.  He has been doing well since last visits with Eric Form, NP and Dr. Valeta Harms.  OV 03/17/21 with Dr. Valeta Harms This is a 61 year old gentleman, history of dyslipidemia, hypertension, mitral valve regurgitation with a recent hospitalization.Patient was admitted to the hospital by critical care services.  Found to have acute hypoxemic respiratory failure, viral prodrome type symptoms, pulmonary opacities negative for COVID.  Was treated for community-acquired pneumonia RVP was negative.  Unable to get CTA due to AKI.  Patient had bilateral lower extremity duplex completed on 02/20/2021 which revealed acute DVTs in the right and left peroneal veins and left posterior tibial deep veins.  Patient was started on anticoagulation and discharged on Eliquis.  Discharge summary from Dr. Marthenia Rolling was reviewed.  Also during hospitalization had noticed a right elevated hemidiaphragm felt to be chronic.  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  Past Medical History:  Diagnosis Date   Dyslipidemia    HTN (hypertension)    Mitral valve regurgitation    a. 05/2011: TEE showing significant MVP especially involving the P2 segment without evidence for ruptured chordae and had eccentric MR anteriorly directed. b. 03/2015: echo showing EF 65-70% with posterior  MV leaflet thickening and prolapse with mild MR.   Normal coronary arteries    a. normal cors by cath in 08/2011     Family History  Problem Relation Age of Onset   Sleep apnea Brother    Congestive Heart Failure Mother      Social History   Socioeconomic History   Marital status: Single    Spouse name: Not on file   Number of children:  Not on file   Years of education: Not on file   Highest education level: Not on file  Occupational History   Not on file  Tobacco Use   Smoking status: Never   Smokeless tobacco: Never  Vaping Use   Vaping Use: Never used  Substance and Sexual Activity   Alcohol use: No    Alcohol/week: 0.0 standard drinks of alcohol   Drug use: Not on file   Sexual activity: Not on file  Other Topics Concern   Not on file  Social History Narrative   Not on file   Social Determinants of Health   Financial Resource Strain: Not on file  Food Insecurity: Not on file  Transportation Needs: Not on file  Physical Activity: Not on file  Stress: Not on file  Social Connections: Not on file  Intimate Partner Violence: Not on file     Allergies  Allergen Reactions   Contrast Media [Iodinated Contrast Media] Rash     Outpatient Medications Prior to Visit  Medication Sig Dispense Refill   albuterol (VENTOLIN HFA) 108 (90 Base) MCG/ACT inhaler Inhale 2 puffs into the lungs every 6 (six) hours as needed for wheezing or shortness of breath. 8.5 g 0   amLODipine (NORVASC) 5 MG tablet Take 5 mg by mouth daily.     atorvastatin (LIPITOR) 20 MG tablet Take 1 tablet (20 mg total) by mouth daily at 6 PM. 30 tablet 6   docusate sodium (COLACE) 100 MG capsule Take 100 mg by mouth daily as needed for mild constipation.     hydrochlorothiazide (HYDRODIURIL) 25 MG tablet TAKE 1 TABLET BY MOUTH EVERY DAY (Patient taking differently: Take 25 mg by mouth daily.) 90 tablet 1   melatonin 5 MG TABS Take 5 mg by mouth at bedtime as needed (sleep).     metoprolol succinate (TOPROL-XL) 50 MG 24 hr tablet TAKE 1 TABLET BY MOUTH DAILY WITH OR IMMEDIATELY FOLLOWING A MEAL (Patient taking differently: 50 mg daily.) 90 tablet 1   potassium chloride SA (KLOR-CON M) 20 MEQ tablet Take 1 tablet (20 mEq total) by mouth daily. 90 tablet 3   XARELTO 10 MG TABS tablet Take 10 mg by mouth daily.     fluticasone-salmeterol (ADVAIR  HFA) 115-21 MCG/ACT inhaler Inhale 2 puffs into the lungs 2 (two) times daily. 8 g 0   No facility-administered medications prior to visit.   Review of Systems  Constitutional:  Negative for chills, fever, malaise/fatigue and weight loss.  HENT:  Negative for congestion, sinus pain and sore throat.   Eyes: Negative.   Respiratory:  Positive for shortness of breath. Negative for cough, hemoptysis, sputum production and wheezing.   Cardiovascular:  Negative for chest pain, palpitations, orthopnea, claudication and leg swelling.  Gastrointestinal:  Negative for abdominal pain, heartburn, nausea and vomiting.  Genitourinary: Negative.   Musculoskeletal:  Negative for joint pain and myalgias.  Skin:  Negative for rash.  Neurological:  Negative for weakness.  Endo/Heme/Allergies:  Positive for environmental allergies.  Psychiatric/Behavioral: Negative.  Objective:   Vitals:   06/09/22 1447  BP: 120/68  Pulse: 63  Temp: 98.5 F (36.9 C)  TempSrc: Oral  SpO2: 91%  Weight: 251 lb (113.9 kg)  Height: 6\' 1"  (1.854 m)   Physical Exam Constitutional:      General: He is not in acute distress. HENT:     Head: Normocephalic and atraumatic.  Eyes:     Conjunctiva/sclera: Conjunctivae normal.  Cardiovascular:     Rate and Rhythm: Normal rate and regular rhythm.     Pulses: Normal pulses.     Heart sounds: Normal heart sounds. No murmur heard. Pulmonary:     Effort: Pulmonary effort is normal.     Breath sounds: Normal breath sounds.  Musculoskeletal:     Right lower leg: No edema.     Left lower leg: No edema.  Skin:    General: Skin is warm and dry.  Neurological:     General: No focal deficit present.     Mental Status: He is alert.  Psychiatric:        Mood and Affect: Mood normal.        Behavior: Behavior normal.        Thought Content: Thought content normal.        Judgment: Judgment normal.    CBC    Component Value Date/Time   WBC 5.0 05/16/2022 0615   RBC  4.88 05/16/2022 0615   HGB 15.6 05/16/2022 0615   HGB 15.5 08/08/2019 0813   HCT 47.9 05/16/2022 0615   HCT 47.1 08/08/2019 0813   PLT 158 05/16/2022 0615   PLT 205 08/08/2019 0813   MCV 98.2 05/16/2022 0615   MCV 91 08/08/2019 0813   MCH 32.0 05/16/2022 0615   MCHC 32.6 05/16/2022 0615   RDW 14.7 05/16/2022 0615   RDW 15.9 (H) 08/08/2019 0813   LYMPHSABS 0.8 05/13/2022 1654   MONOABS 0.5 05/13/2022 1654   EOSABS 0.1 05/13/2022 1654   BASOSABS 0.0 05/13/2022 1654      Latest Ref Rng & Units 05/17/2022    3:40 AM 05/16/2022    6:15 AM 05/15/2022    6:14 AM  BMP  Glucose 70 - 99 mg/dL 05/17/2022  664  99   BUN 6 - 20 mg/dL 8  11  10    Creatinine 0.61 - 1.24 mg/dL 403   4.74   Sodium 135 - 145 mmol/L 142  143  143   Potassium 3.5 - 5.1 mmol/L 4.0  3.4  3.7   Chloride 98 - 111 mmol/L 103  98  98   CO2 22 - 32 mmol/L 34  36  37   Calcium 8.9 - 10.3 mg/dL 9.7  9.8  9.6    Chest imaging: CT Chest 05/13/22 Mediastinum/Nodes: No enlarged mediastinal or axillary lymph nodes. Thyroid gland, trachea, and esophagus demonstrate no significant findings.   Lungs/Pleura: Again seen is the gross elevation of the right hemidiaphragm extending up to the mid thorax. There is consolidative opacity seen at the posterior right lung base with some air bronchograms and is more prominent in the present study. In addition, there is some peribronchial thickness and stranding seen at the right upper lobe. There is peribronchial thickness and mild consolidative opacity, narrowing of the left upper and lower lobe bronchi seen. Left basilar atelectasis.  NM PET Scan 04/22/21 1. The previously demonstrated subpleural density posteromedially in the left lower lobe has resolved, consistent with resolved atelectasis or inflammation. No evidence of thoracic malignancy.  2. No suspicious hypermetabolic activity within the neck, chest, abdomen or pelvis. 3. Chronic right hemidiaphragm elevation and right  basilar atelectasis. Cholelithiasis. Aortic Atherosclerosis  CT Chest 03/08/21 1. There is a subpleural density within the posterior medial left lower lobe measuring up to 2.9 cm. Differential considerations include consolidation due to pneumonia versus underlying malignancy. Consider one of the following in 3 months for both low-risk and high-risk individuals: (a) repeat chest CT, (b) follow-up PET-CT, or (c) tissue sampling. This recommendation follows the consensus statement: Guidelines for Management of Incidental Pulmonary Nodules Detected on CT Images: From the Fleischner Society 2017; Radiology 2017; 284:228-243. 2. Marked asymmetric elevation of the right hemidiaphragm with partial atelectasis of the right middle lobe and right lower lobe. 3. Aortic atherosclerosis. 4. Gallstones.   PFT:    Latest Ref Rng & Units 01/03/2022    9:48 AM  PFT Results  FVC-Pre L 2.13   FVC-Predicted Pre % 41   FVC-Post L 2.40   FVC-Predicted Post % 46   Pre FEV1/FVC % % 85   Post FEV1/FCV % % 86   FEV1-Pre L 1.82   FEV1-Predicted Pre % 46   FEV1-Post L 2.05   DLCO uncorrected ml/min/mmHg 25.96   DLCO UNC% % 88   DLCO corrected ml/min/mmHg 24.19   DLCO COR %Predicted % 82   DLVA Predicted % 148   TLC L 5.50   TLC % Predicted % 74   RV % Predicted % 127   01/2022: Mild restriction and significant bronchodilator response.  Labs:  Path:  Echo 02/20/21: LV EF 60-65%. RV size and systolic function are normal. RVSP 25.9mmHg. Prolapse of the posterior MV leaflet with eccentric anterior MR difficult to quantify. Mild to moderate mitral valve regurgitation. LA and RA normal size.  Heart Catheterization:  Vas [place] Lower Extremity 02/20/21 Acute DVT of right and left peroneal veins     Assessment & Plan:   Chronic respiratory failure with hypoxia (HCC)  OSA on CPAP  Elevated diaphragm  Mild pulmonary hypertension (HCC)  Mild persistent asthma without complication  Mild intermittent  reactive airway disease without complication - Plan: fluticasone-salmeterol (ADVAIR HFA) 115-21 MCG/ACT inhaler  Discussion: Danny Norris is a 61 year old male, never smoker with history of hypertension, mitral valve regurgitation due to MV prolapse, pulmonary nodule, elevated right hemidiaphragm and DVT who returns to pulmonary clinic for chronic hypoxemic respiratory failure.   He has recovered well from his recent hospitalization from pneumonia.   He is to continue on 3L supplemental oxygen and CPAP therapy at night for OSA.   RHC shows evidence of mild pulmonary hypertension in setting of valvular disease vs known OSA.  His PFTs show mild restriction which could be attribted to the elevated right hemidiaphragm. There is significant bronchodilator response.   He has benefited from Advair hfa 115-21mcg 2 puffs twice daily and is to continue this therapy.  Disability paperwork completed in clinic today. Will reassess at follow up in 3 months.  Follow up in 3 months.  Freda Jackson, MD [facility] Office: [phone]   Current Outpatient Medications:    albuterol (VENTOLIN HFA) 108 (90 Base) MCG/ACT inhaler, Inhale 2 puffs into the lungs every 6 (six) hours as needed for wheezing or shortness of breath., Disp: 8.5 g, Rfl: 0   amLODipine (NORVASC) 5 MG tablet, Take 5 mg by mouth daily., Disp: , Rfl:    atorvastatin (LIPITOR) 20 MG tablet, Take 1 tablet (20 mg total) by mouth  daily at 6 PM., Disp: 30 tablet, Rfl: 6   docusate sodium (COLACE) 100 MG capsule, Take 100 mg by mouth daily as needed for mild constipation., Disp: , Rfl:    hydrochlorothiazide (HYDRODIURIL) 25 MG tablet, TAKE 1 TABLET BY MOUTH EVERY DAY (Patient taking differently: Take 25 mg by mouth daily.), Disp: 90 tablet, Rfl: 1   melatonin 5 MG TABS, Take 5 mg by mouth at bedtime as needed (sleep)., Disp: , Rfl:    metoprolol succinate (TOPROL-XL) 50 MG 24 hr tablet, TAKE 1 TABLET BY MOUTH DAILY  WITH OR IMMEDIATELY FOLLOWING A MEAL (Patient taking differently: 50 mg daily.), Disp: 90 tablet, Rfl: 1   potassium chloride SA (KLOR-CON M) 20 MEQ tablet, Take 1 tablet (20 mEq total) by mouth daily., Disp: 90 tablet, Rfl: 3   XARELTO 10 MG TABS tablet, Take 10 mg by mouth daily., Disp: , Rfl:    fluticasone-salmeterol (ADVAIR HFA) 115-21 MCG/ACT inhaler, Inhale 2 puffs into the lungs 2 (two) times daily., Disp: 8 g, Rfl: 6

## 2022-06-09 NOTE — Patient Instructions (Addendum)
Continue advair inhaler 2 puffs twice daily - rinse mouth out after each use   Continue 3L of oxygen when abulating   Continue CPAP at night for obstructive sleep apnea   I have completed disability paperwork to place you on leave until our follow up visit in 3 months  Follow up in 3 months.

## 2022-06-09 NOTE — Telephone Encounter (Signed)
Patient was discharged from hospital in August and was placed on Advair HFA and he ran out yesterday. And he is unsure if the Advair HFA is covered under his insurance.   Can we run a ticket to see what is covered for this patient  Thank you

## 2022-06-13 ENCOUNTER — Telehealth: Payer: Self-pay | Admitting: Pulmonary Disease

## 2022-06-13 MED ORDER — FLUTICASONE-SALMETEROL 250-50 MCG/ACT IN AEPB: 1.0000 | INHALATION_SPRAY | Freq: Two times a day (BID) | RESPIRATORY_TRACT | 11 refills | Status: DC

## 2022-06-13 NOTE — Telephone Encounter (Signed)
Rx for generic advair has been sent to pharmacy for pt. Attempted to call pt's sister Marylene Land to let her know this had been done but line went directly to VM. Left a detailed message for her letting her know that the Rx was sent. Nothing further needed.

## 2022-06-13 NOTE — Telephone Encounter (Signed)
Yes, I am fine with that in placed of Advair Hfa

## 2022-06-13 NOTE — Telephone Encounter (Signed)
Patient's sister states that they have spoken with the pharmacy and insurance and found that insurance will cover generic advair, fluticasone-salmeterol 250/50 15 day supply sent to [facility] on [address]. Patient has been out of medication for over a week so routing as high priority.   Please advise, call Marylene Land back at [phone]

## 2022-06-13 NOTE — Telephone Encounter (Signed)
With Dr. Francine Graven being nightfloat, routing to provider of the day to make sure they are okay with [place] switching pt's med. Beth, please advise.

## 2022-06-19 DIAGNOSIS — J9691 Respiratory failure, unspecified with hypoxia: Secondary | ICD-10-CM | POA: Diagnosis not present

## 2022-07-15 ENCOUNTER — Other Ambulatory Visit (HOSPITAL_COMMUNITY): Payer: Self-pay

## 2022-07-19 DIAGNOSIS — J9691 Respiratory failure, unspecified with hypoxia: Secondary | ICD-10-CM | POA: Diagnosis not present

## 2022-08-19 DIAGNOSIS — J9691 Respiratory failure, unspecified with hypoxia: Secondary | ICD-10-CM | POA: Diagnosis not present

## 2022-09-07 ENCOUNTER — Ambulatory Visit (INDEPENDENT_AMBULATORY_CARE_PROVIDER_SITE_OTHER): Payer: BC Managed Care – PPO | Admitting: Pulmonary Disease

## 2022-09-07 ENCOUNTER — Encounter: Payer: Self-pay | Admitting: Pulmonary Disease

## 2022-09-07 VITALS — BP 140/86 | HR 72 | Ht 73.0 in | Wt 257.2 lb

## 2022-09-07 DIAGNOSIS — J4530 Mild persistent asthma, uncomplicated: Secondary | ICD-10-CM | POA: Diagnosis not present

## 2022-09-07 DIAGNOSIS — J986 Disorders of diaphragm: Secondary | ICD-10-CM

## 2022-09-07 NOTE — Patient Instructions (Addendum)
We will schedule you with one of our sleep specialists for your sleep apnea.   Your walk test today does not show a significant drop in your oxygen with walking.  You are safe to return to work and we will write you a work note that you can return with being able to sit in a chair during your shift.   Follow up in 6 months

## 2022-09-07 NOTE — Progress Notes (Signed)
Synopsis: Referred in January 2023 for respiratory failure  Subjective:   PATIENT ID: Danny Norris GENDER: male DOB: [birth year], MRN: [id number]   HPI  Chief Complaint  Patient presents with   Follow-up   Danny Norris is a 61 year old male, never smoker with history of hypertension, mitral valve regurgitation due to MV prolapse, pulmonary nodule, elevated right hemidiaphragm and DVT who returns to pulmonary clinic for chronic hypoxemic respiratory failure.   He was walked today with no drop below 88% in his oxygen level. He is overall feeling ok. He still has exertional dyspnea. He has not been very active over recent months.   OV 06/09/22 He was admitted 8/11 to 8/15 for acute on chronic respiratory failure due to pneumonia. He was seen by cardiology 8/22 for [facility] He remains on 3L oxygen via POC. He desaturated on room air to 84% and placed back on 3L pulsed with saturations above 88%.   They have brought in paper work for disability today due to him being back on oxygen therapy.   OV 03/07/22 He was started on advair HFA 115-82mcg 2 puffs twice daily after last visit with improvement of his cough and wheezing.   He has on going seasonal allergy symptoms with sneezing.   OV 01/03/22 He had RHC on 12/20/21 which showed mean PA pressure 24, PCW 11, PVR 2.1 WU noting mild pulmonary hypertension and slight step up in right heart pressures. It was recommended to obtain an echo with bubble study to evaluate for shunt based on step up in pressures.  He reports increased wheezing and cough along with nasal congestion due to spring allergies.   PFTs today show mild restrictive defect and significant bronchodilator response.  OV 10/11/21 He will be completing pulmonary rehab tomorrow and reports he continues to use supplemental oxygen 2L with exercise while at rehab. Otherwise he is not using supplemental oxygen. He is using CPAP therapy at night.   NM PET Scan for further workup of the left  lower lobe lobe subpleural nodule noted on 03/08/21 CT Chest scan had resolved. No other concerning findings on the scan that was suspicious of hypermetabolic activity.   He remains on eliquis for DVT treatment.  He has been doing well since last visits with Kandice Robinsons, NP and Dr. Tonia Brooms.  OV 03/17/21 with Dr. Tonia Brooms This is a 61 year old gentleman, history of dyslipidemia, hypertension, mitral valve regurgitation with a recent hospitalization.Patient was admitted to the hospital by critical care services.  Found to have acute hypoxemic respiratory failure, viral prodrome type symptoms, pulmonary opacities negative for COVID.  Was treated for community-acquired pneumonia RVP was negative.  Unable to get CTA due to AKI.  Patient had bilateral lower extremity duplex completed on 02/20/2021 which revealed acute DVTs in the right and left peroneal veins and left posterior tibial deep veins.  Patient was started on anticoagulation and discharged on Eliquis.  Discharge summary from Dr. Dartha Lodge was reviewed.  Also during hospitalization had noticed a right elevated hemidiaphragm felt to be chronic.  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  Past Medical History:  Diagnosis Date   Dyslipidemia    HTN (hypertension)    Mitral valve regurgitation    a. 05/2011: TEE showing significant MVP especially involving the P2 segment without evidence for ruptured chordae and had eccentric MR anteriorly directed. b. 03/2015: echo showing EF 65-70% with posterior MV leaflet thickening and prolapse with mild MR.   Normal coronary  arteries    a. normal cors by cath in 08/2011     Family History  Problem Relation Age of Onset   Sleep apnea Brother    Congestive Heart Failure Mother      Social History   Socioeconomic History   Marital status: Single    Spouse name: Not on file   Number of children: Not on file   Years of education: Not on file   Highest education level:  Not on file  Occupational History   Not on file  Tobacco Use   Smoking status: Never   Smokeless tobacco: Never  Vaping Use   Vaping Use: Never used  Substance and Sexual Activity   Alcohol use: No    Alcohol/week: 0.0 standard drinks of alcohol   Drug use: Not on file   Sexual activity: Not on file  Other Topics Concern   Not on file  Social History Narrative   Not on file   Social Determinants of Health   Financial Resource Strain: Not on file  Food Insecurity: Not on file  Transportation Needs: Not on file  Physical Activity: Not on file  Stress: Not on file  Social Connections: Not on file  Intimate Partner Violence: Not on file     Allergies  Allergen Reactions   Contrast Media [Iodinated Contrast Media] Rash     Outpatient Medications Prior to Visit  Medication Sig Dispense Refill   amLODipine (NORVASC) 5 MG tablet Take 5 mg by mouth daily.     atorvastatin (LIPITOR) 20 MG tablet Take 1 tablet (20 mg total) by mouth daily at 6 PM. 30 tablet 6   fluticasone-salmeterol (ADVAIR) 250-50 MCG/ACT AEPB Inhale 1 puff into the lungs every 12 (twelve) hours. 60 each 11   hydrochlorothiazide (HYDRODIURIL) 25 MG tablet TAKE 1 TABLET BY MOUTH EVERY DAY (Patient taking differently: Take 25 mg by mouth daily.) 90 tablet 1   melatonin 5 MG TABS Take 5 mg by mouth at bedtime as needed (sleep).     metoprolol succinate (TOPROL-XL) 50 MG 24 hr tablet TAKE 1 TABLET BY MOUTH DAILY WITH OR IMMEDIATELY FOLLOWING A MEAL (Patient taking differently: 50 mg daily.) 90 tablet 1   potassium chloride SA (KLOR-CON M) 20 MEQ tablet Take 1 tablet (20 mEq total) by mouth daily. 90 tablet 3   XARELTO 10 MG TABS tablet Take 10 mg by mouth daily.     albuterol (VENTOLIN HFA) 108 (90 Base) MCG/ACT inhaler Inhale 2 puffs into the lungs every 6 (six) hours as needed for wheezing or shortness of breath. (Patient not taking: Reported on 09/07/2022) 8.5 g 0   docusate sodium (COLACE) 100 MG capsule Take 100  mg by mouth daily as needed for mild constipation. (Patient not taking: Reported on 09/07/2022)     No facility-administered medications prior to visit.   Review of Systems  Constitutional:  Negative for chills, fever, malaise/fatigue and weight loss.  HENT:  Negative for congestion, sinus pain and sore throat.   Eyes: Negative.   Respiratory:  Positive for shortness of breath. Negative for cough, hemoptysis, sputum production and wheezing.   Cardiovascular:  Negative for chest pain, palpitations, orthopnea, claudication and leg swelling.  Gastrointestinal:  Negative for abdominal pain, heartburn, nausea and vomiting.  Genitourinary: Negative.   Musculoskeletal:  Negative for joint pain and myalgias.  Skin:  Negative for rash.  Neurological:  Negative for weakness.  Endo/Heme/Allergies:  Positive for environmental allergies.  Psychiatric/Behavioral: Negative.     Objective:  Vitals:   09/07/22 1416  BP: (!) 140/86  Pulse: 72  SpO2: 92%  Weight: 257 lb 3.2 oz (116.7 kg)  Height: 6\' 1"  (1.854 m)   Physical Exam Constitutional:      General: He is not in acute distress. HENT:     Head: Normocephalic and atraumatic.  Eyes:     Conjunctiva/sclera: Conjunctivae normal.  Cardiovascular:     Rate and Rhythm: Normal rate and regular rhythm.     Pulses: Normal pulses.     Heart sounds: Normal heart sounds. No murmur heard. Pulmonary:     Effort: Pulmonary effort is normal.     Breath sounds: Normal breath sounds.  Musculoskeletal:     Right lower leg: No edema.     Left lower leg: No edema.  Skin:    General: Skin is warm and dry.  Neurological:     General: No focal deficit present.     Mental Status: He is alert.  Psychiatric:        Mood and Affect: Mood normal.        Behavior: Behavior normal.        Thought Content: Thought content normal.        Judgment: Judgment normal.    CBC    Component Value Date/Time   WBC 5.0 05/16/2022 0615   RBC 4.88 05/16/2022 0615    HGB 15.6 05/16/2022 0615   HGB 15.5 08/08/2019 0813   HCT 47.9 05/16/2022 0615   HCT 47.1 08/08/2019 0813   PLT 158 05/16/2022 0615   PLT 205 08/08/2019 0813   MCV 98.2 05/16/2022 0615   MCV 91 08/08/2019 0813   MCH 32.0 05/16/2022 0615   MCHC 32.6 05/16/2022 0615   RDW 14.7 05/16/2022 0615   RDW 15.9 (H) 08/08/2019 0813   LYMPHSABS 0.8 05/13/2022 1654   MONOABS 0.5 05/13/2022 1654   EOSABS 0.1 05/13/2022 1654   BASOSABS 0.0 05/13/2022 1654      Latest Ref Rng & Units 05/17/2022    3:40 AM 05/16/2022    6:15 AM 05/15/2022    6:14 AM  BMP  Glucose 70 - 99 mg/dL 116  115  99   BUN 6 - 20 mg/dL 8  11  10    Creatinine 0.61 - 1.24 mg/dL 0.79  0.97  0.81   Sodium 135 - 145 mmol/L 142  143  143   Potassium 3.5 - 5.1 mmol/L 4.0  3.4  3.7   Chloride 98 - 111 mmol/L 103  98  98   CO2 22 - 32 mmol/L 34  36  37   Calcium 8.9 - 10.3 mg/dL 9.7  9.8  9.6    Chest imaging: CT Chest 05/13/22 Mediastinum/Nodes: No enlarged mediastinal or axillary lymph nodes. Thyroid gland, trachea, and esophagus demonstrate no significant findings.   Lungs/Pleura: Again seen is the gross elevation of the right hemidiaphragm extending up to the mid thorax. There is consolidative opacity seen at the posterior right lung base with some air bronchograms and is more prominent in the present study. In addition, there is some peribronchial thickness and stranding seen at the right upper lobe. There is peribronchial thickness and mild consolidative opacity, narrowing of the left upper and lower lobe bronchi seen. Left basilar atelectasis.  NM PET Scan 04/22/21 1. The previously demonstrated subpleural density posteromedially in the left lower lobe has resolved, consistent with resolved atelectasis or inflammation. No evidence of thoracic malignancy. 2. No suspicious hypermetabolic activity within the neck, chest,  abdomen or pelvis. 3. Chronic right hemidiaphragm elevation and right basilar atelectasis.  Cholelithiasis. Aortic Atherosclerosis  CT Chest 03/08/21 1. There is a subpleural density within the posterior medial left lower lobe measuring up to 2.9 cm. Differential considerations include consolidation due to pneumonia versus underlying malignancy. Consider one of the following in 3 months for both low-risk and high-risk individuals: (a) repeat chest CT, (b) follow-up PET-CT, or (c) tissue sampling. This recommendation follows the consensus statement: Guidelines for Management of Incidental Pulmonary Nodules Detected on CT Images: From the Fleischner Society 2017; Radiology 2017; 284:228-243. 2. Marked asymmetric elevation of the right hemidiaphragm with partial atelectasis of the right middle lobe and right lower lobe. 3. Aortic atherosclerosis. 4. Gallstones.   PFT:    Latest Ref Rng & Units 01/03/2022    9:48 AM  PFT Results  FVC-Pre L 2.13   FVC-Predicted Pre % 41   FVC-Post L 2.40   FVC-Predicted Post % 46   Pre FEV1/FVC % % 85   Post FEV1/FCV % % 86   FEV1-Pre L 1.82   FEV1-Predicted Pre % 46   FEV1-Post L 2.05   DLCO uncorrected ml/min/mmHg 25.96   DLCO UNC% % 88   DLCO corrected ml/min/mmHg 24.19   DLCO COR %Predicted % 82   DLVA Predicted % 148   TLC L 5.50   TLC % Predicted % 74   RV % Predicted % 127   01/2022: Mild restriction and significant bronchodilator response.  Labs:  Path:  Echo 02/20/21: LV EF 60-65%. RV size and systolic function are normal. RVSP 25.56mmHg. Prolapse of the posterior MV leaflet with eccentric anterior MR difficult to quantify. Mild to moderate mitral valve regurgitation. LA and RA normal size.  Heart Catheterization:  Vas [place] Lower Extremity 02/20/21 Acute DVT of right and left peroneal veins     Assessment & Plan:   Mild persistent asthma without complication  Elevated diaphragm  Discussion: Danny Norris is a 61 year old male, never smoker with history of hypertension, mitral valve regurgitation due to MV prolapse,  pulmonary nodule, elevated right hemidiaphragm and DVT who returns to pulmonary clinic for chronic hypoxemic respiratory failure.   Based on simple walk test today he does not require oxygen at rest or with ambulation.   He is to continue on 3L supplemental oxygen and CPAP therapy at night for OSA.   He is to continue Advair HFA 115-48mcg 2 puffs twice daily.   RHC shows evidence of mild pulmonary hypertension in setting of valvular disease vs known OSA.  His PFTs show mild restriction which could be attribted to the elevated right hemidiaphragm. There is significant bronchodilator response.   He is safe to return to work.  Follow up in 6 months.  Melody Comas, MD [facility] Office: [phone]   Current Outpatient Medications:    amLODipine (NORVASC) 5 MG tablet, Take 5 mg by mouth daily., Disp: , Rfl:    atorvastatin (LIPITOR) 20 MG tablet, Take 1 tablet (20 mg total) by mouth daily at 6 PM., Disp: 30 tablet, Rfl: 6   fluticasone-salmeterol (ADVAIR) 250-50 MCG/ACT AEPB, Inhale 1 puff into the lungs every 12 (twelve) hours., Disp: 60 each, Rfl: 11   hydrochlorothiazide (HYDRODIURIL) 25 MG tablet, TAKE 1 TABLET BY MOUTH EVERY DAY (Patient taking differently: Take 25 mg by mouth daily.), Disp: 90 tablet, Rfl: 1   melatonin 5 MG TABS, Take 5 mg by mouth at bedtime as needed (sleep)., Disp: , Rfl:  metoprolol succinate (TOPROL-XL) 50 MG 24 hr tablet, TAKE 1 TABLET BY MOUTH DAILY WITH OR IMMEDIATELY FOLLOWING A MEAL (Patient taking differently: 50 mg daily.), Disp: 90 tablet, Rfl: 1   potassium chloride SA (KLOR-CON M) 20 MEQ tablet, Take 1 tablet (20 mEq total) by mouth daily., Disp: 90 tablet, Rfl: 3   XARELTO 10 MG TABS tablet, Take 10 mg by mouth daily., Disp: , Rfl:    albuterol (VENTOLIN HFA) 108 (90 Base) MCG/ACT inhaler, Inhale 2 puffs into the lungs every 6 (six) hours as needed for wheezing or shortness of breath. (Patient not taking: Reported on  09/07/2022), Disp: 8.5 g, Rfl: 0   docusate sodium (COLACE) 100 MG capsule, Take 100 mg by mouth daily as needed for mild constipation. (Patient not taking: Reported on 09/07/2022), Disp: , Rfl:

## 2022-09-12 ENCOUNTER — Ambulatory Visit: Payer: BC Managed Care – PPO | Admitting: Pulmonary Disease

## 2022-09-14 DIAGNOSIS — G4733 Obstructive sleep apnea (adult) (pediatric): Secondary | ICD-10-CM | POA: Diagnosis not present

## 2022-09-15 ENCOUNTER — Telehealth: Payer: Self-pay | Admitting: Pulmonary Disease

## 2022-09-15 NOTE — Telephone Encounter (Signed)
Received Accomodation at Work form from [employer].  Patient was seen on 12/6 and notes say disability paperwork was completed.  There is no copy in the patient's file, so I called [facility] and they do not have a copy either.  Dr. Francine Graven will not be back in office until Jan 2, so Cyndia Bent at [facility] made a note in the patient's account.  Per her request, I faxed a copy of the letter of accomodation written by Dr. Francine Graven on 12/6 and office notes from 9/7 and 09/07/22.  Also called patient and left a voice message telling him what I've faxed and that the form will be completed by Dr. Francine Graven when he returns to the office.   [facility] fax# [phone].  Disability Claim# [id number]

## 2022-09-18 DIAGNOSIS — J9691 Respiratory failure, unspecified with hypoxia: Secondary | ICD-10-CM | POA: Diagnosis not present

## 2022-09-21 DIAGNOSIS — R059 Cough, unspecified: Secondary | ICD-10-CM | POA: Diagnosis not present

## 2022-09-21 DIAGNOSIS — J189 Pneumonia, unspecified organism: Secondary | ICD-10-CM | POA: Diagnosis not present

## 2022-09-22 ENCOUNTER — Telehealth: Payer: Self-pay | Admitting: Pulmonary Disease

## 2022-09-22 NOTE — Telephone Encounter (Signed)
Thanks for update. They can always call to be seen by an APP if needed.  JD

## 2022-09-23 NOTE — Telephone Encounter (Signed)
Noted by clinical staff. Nothing further needed  

## 2022-09-25 DIAGNOSIS — J189 Pneumonia, unspecified organism: Secondary | ICD-10-CM | POA: Diagnosis not present

## 2022-09-29 DIAGNOSIS — J189 Pneumonia, unspecified organism: Secondary | ICD-10-CM | POA: Diagnosis not present

## 2022-10-05 NOTE — Telephone Encounter (Signed)
Patient's guardian is call back to make sure that the doctor has signed the additional disability paperwork that is needed today.  Please call [facility] to make sure that this paperwork has been filled out asap.  CB# [phone]

## 2022-10-06 ENCOUNTER — Telehealth: Payer: Self-pay | Admitting: Pulmonary Disease

## 2022-10-06 NOTE — Telephone Encounter (Signed)
Went to [employer] to get from from [employer]. Filled out fax cover sheet to fax form. Called sister to update her that I was filling out cover sheet to get form faxed out. She verbalized understanding. Nothing further needed

## 2022-10-06 NOTE — Telephone Encounter (Signed)
Form has been completed and sitting on desk in [employer] by Cherina.  He is safe to return to work with accomodation of being about to sit as needed during his first 2 weeks back to work.  Thanks, JD

## 2022-10-06 NOTE — Telephone Encounter (Signed)
Called sister back and she states that her brother is wanting to return to work. She is needing the letter to be faxed today.  Sir are you ok if I write the letter releasing patient to go back to work, and if so do you have a certain date he is able to return? Any restrictions?

## 2022-10-11 ENCOUNTER — Encounter: Payer: Self-pay | Admitting: Pulmonary Disease

## 2022-10-19 DIAGNOSIS — J9691 Respiratory failure, unspecified with hypoxia: Secondary | ICD-10-CM | POA: Diagnosis not present

## 2022-10-25 ENCOUNTER — Ambulatory Visit: Payer: BC Managed Care – PPO | Attending: Cardiovascular Disease | Admitting: Cardiovascular Disease

## 2022-10-25 VITALS — BP 130/80 | HR 61 | Ht 72.0 in | Wt 259.0 lb

## 2022-10-25 DIAGNOSIS — I340 Nonrheumatic mitral (valve) insufficiency: Secondary | ICD-10-CM

## 2022-10-25 DIAGNOSIS — R0600 Dyspnea, unspecified: Secondary | ICD-10-CM | POA: Diagnosis not present

## 2022-10-25 DIAGNOSIS — E782 Mixed hyperlipidemia: Secondary | ICD-10-CM

## 2022-10-25 DIAGNOSIS — G4733 Obstructive sleep apnea (adult) (pediatric): Secondary | ICD-10-CM | POA: Diagnosis not present

## 2022-10-25 DIAGNOSIS — I2721 Secondary pulmonary arterial hypertension: Secondary | ICD-10-CM

## 2022-10-25 DIAGNOSIS — I10 Essential (primary) hypertension: Secondary | ICD-10-CM | POA: Diagnosis not present

## 2022-10-25 DIAGNOSIS — I341 Nonrheumatic mitral (valve) prolapse: Secondary | ICD-10-CM

## 2022-10-25 DIAGNOSIS — E7800 Pure hypercholesterolemia, unspecified: Secondary | ICD-10-CM

## 2022-10-25 NOTE — Progress Notes (Signed)
Cardiology Office Note    Date:  10/30/2022   ID:  Danny, Norris [birth year], MRN [id number]  PCP:  Danny James, MD  Cardiologist:  Danny Guadalajara, MD   Reestablishment of cardiology care with me, last seen by me December 2020.   History of Present Illness:  Danny Norris is a 62 y.o. male who is followed by Danny Norris for primary care.  He has a history of previously documented significant mitral valve prolapse with moderately severe to severe mitral regurgitation by TEE in August 2012.  He has a history of obstructive sleep apnea diagnosed in 2009 by Danny Norris.  Cardiac catheterization in November 2012 did not reveal CAD, PA pressure was 28/12, PCWP 15 mmHg.  An echo Doppler study November 2020 showed normal systolic function with EF 60 to 65% with moderate LVH, and moderate mitral valve prolapse with mild to moderate MR and mild to moderate AR.  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  I have not seen him since December 2020.  He underwent an echo Doppler study in May 2022 that showed an EF of 60 to 65%.  There was prolapse of his posterior mitral valve leaflet and he was felt to have mild to moderate MR.  His aortic root dimensions had improved and were 41 and 42 mm.  He has been evaluated by Danny Norris and advanced heart failure clinic for pulmonary hypertension.  He underwent right heart cardiac catheterization on December 20, 2021.  He was felt to have very mild PAH with normal LV filling pressures.  PA pressure 36/19 with a mean of 24.  He was felt a very slight step up and right heart pressures.  Ao sat was 93%, PA sat 71 and 76%, and SVC sat 64%.  In May 2022, he was admitted with acute respiratory failure, required intubation, and was found to have bilateral DVT.  He did not undergo CT with contrast due to contrast allergy.  No VQ scan was performed.  He has been on anticoagulation therapy.   He was hospitalized from August 11  through May 17, 2022 with acute on chronic respiratory failure with hypoxia due to multifocal pneumonia and was treated with antibiotics.  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   Presently, he denies any chest pain.  He has been using CPAP intermittently but not routinely.  He often goes to bed between 1 and 1:30 AM and wakes up between 8 and 9 AM.  He has a ResMed F30i fullface mask.  Presently he is on a regimen of amlodipine 5 mg, HCTZ 25 mg, metoprolol succinate 50 mg daily for hypertension.  He is anticoagulated on Xarelto at reduced dose to 10 mg and is on atorvastatin 20 mg for hyperlipidemia.  He has not had recent laboratory.  Lipid studies in August 2023 showed total cholesterol 169 LDL 109 triglycerides 116 HDL 39.  Creatinine was 1.02.  Transaminases were normal.  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  He now presents for reestablishment of care.   Past Medical History:  Diagnosis Date   Dyslipidemia    HTN (hypertension)    Mitral valve regurgitation    a. 05/2011: TEE showing significant MVP especially involving the P2 segment without evidence for ruptured chordae and had eccentric MR anteriorly directed. b. 03/2015: echo showing EF 65-70%  with posterior MV leaflet thickening and prolapse with mild MR.   Normal coronary arteries    a. normal cors by cath in 08/2011    Past Surgical History:  Procedure Laterality Date   EYE SURGERY  1975   HERNIA REPAIR Right 2009   Rt inguinal repair   LEFT AND RIGHT HEART CATHETERIZATION WITH CORONARY ANGIOGRAM N/A 09/01/2011   Procedure: LEFT AND RIGHT HEART CATHETERIZATION WITH CORONARY ANGIOGRAM;  Surgeon: Danny Bihari, MD;  Location: [facility] CATH LAB;  Service: Cardiovascular;  Laterality: N/A;   RIGHT HEART CATH N/A 12/20/2021   Procedure: RIGHT HEART CATH;  Surgeon: Danny Patty, MD;  Location: [facility] INVASIVE CV LAB;  Service: Cardiovascular;  Laterality: N/A;    Current  Medications: Outpatient Medications Prior to Visit  Medication Sig Dispense Refill   amLODipine (NORVASC) 5 MG tablet Take 5 mg by mouth daily.     atorvastatin (LIPITOR) 20 MG tablet Take 1 tablet (20 mg total) by mouth daily at 6 PM. 30 tablet 6   fluticasone-salmeterol (ADVAIR) 250-50 MCG/ACT AEPB Inhale 1 puff into the lungs every 12 (twelve) hours. 60 each 11   hydrochlorothiazide (HYDRODIURIL) 25 MG tablet TAKE 1 TABLET BY MOUTH EVERY DAY (Patient taking differently: Take 25 mg by mouth daily.) 90 tablet 1   melatonin 5 MG TABS Take 5 mg by mouth at bedtime as needed (sleep).     metoprolol succinate (TOPROL-XL) 50 MG 24 hr tablet TAKE 1 TABLET BY MOUTH DAILY WITH OR IMMEDIATELY FOLLOWING A MEAL (Patient taking differently: 50 mg daily.) 90 tablet 1   potassium chloride SA (KLOR-CON M) 20 MEQ tablet Take 1 tablet (20 mEq total) by mouth daily. 90 tablet 3   XARELTO 10 MG TABS tablet Take 10 mg by mouth daily.     albuterol (VENTOLIN HFA) 108 (90 Base) MCG/ACT inhaler Inhale 2 puffs into the lungs every 6 (six) hours as needed for wheezing or shortness of breath. (Patient not taking: Reported on 09/07/2022) 8.5 g 0   docusate sodium (COLACE) 100 MG capsule Take 100 mg by mouth daily as needed for mild constipation. (Patient not taking: Reported on 09/07/2022)     No facility-administered medications prior to visit.     Allergies:   Contrast media [iodinated contrast media]   Social History   Socioeconomic History   Marital status: Single    Spouse name: Not on file   Number of children: Not on file   Years of education: Not on file   Highest education level: Not on file  Occupational History   Not on file  Tobacco Use   Smoking status: Never   Smokeless tobacco: Never  Vaping Use   Vaping Use: Never used  Substance and Sexual Activity   Alcohol use: No    Alcohol/week: 0.0 standard drinks of alcohol   Drug use: Not on file   Sexual activity: Not on file  Other Topics Concern    Not on file  Social History Narrative   Not on file   Social Determinants of Health   Financial Resource Strain: Not on file  Food Insecurity: Not on file  Transportation Needs: Not on file  Physical Activity: Not on file  Stress: Not on file  Social Connections: Not on file    Socially he is single.  No children.  He does minimal exercise approximately 2 days/week.  There is no tobacco or alcohol use.  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  Family History:  The patient's family history includes Congestive Heart Failure in his mother; Sleep apnea in his brother.  His brother died of an aneurysm in 2017-04-07.  ROS General: Negative; No fevers, chills, or night sweats;  HEENT: Negative; No changes in vision or hearing, sinus congestion, difficulty swallowing Pulmonary: Negative; No cough, wheezing, shortness of breath, hemoptysis Cardiovascular: See HPI GI: Negative; No nausea, vomiting, diarrhea, or abdominal pain GU: Negative; No dysuria, hematuria, or difficulty voiding Musculoskeletal: Negative; no myalgias, joint pain, or weakness Hematologic/Oncology: Negative; no easy bruising, bleeding Endocrine: Negative; no heat/cold intolerance; no diabetes Neuro: Negative; no changes in balance, headaches Skin: Negative; No rashes or skin lesions Psychiatric: Negative; No behavioral problems, depression Sleep: OSA, on CPAP with intermittent use  Other comprehensive 14 point system review is negative.   PHYSICAL EXAM:   VS:  BP 130/80   Pulse 61   Ht 6' (1.829 m)   Wt 259 lb (117.5 kg)   SpO2 98%   BMI 35.13 kg/m     Repeat blood pressure by me was 120/72  Wt Readings from Last 3 Encounters:  10/25/22 259 lb (117.5 kg)  09/07/22 257 lb 3.2 oz (116.7 kg)  06/09/22 251 lb (113.9 kg)    General: Alert, oriented, no distress.  Skin: normal turgor, no rashes, warm and dry HEENT: Normocephalic, atraumatic. Pupils equal round and  reactive to light; sclera anicteric; extraocular muscles intact; Fundi ** Nose without nasal septal hypertrophy Mouth/Parynx benign; Mallinpatti scale Neck: No JVD, no carotid bruits; normal carotid upstroke Lungs: clear to ausculatation and percussion; no wheezing or rales Chest wall: without tenderness to palpitation Heart: PMI not displaced, RRR, s1 s2 normal, 1/6 systolic murmur, no diastolic murmur, no rubs, gallops, thrills, or heaves Abdomen: soft, nontender; no hepatosplenomehaly, BS+; abdominal aorta nontender and not dilated by palpation. Back: no CVA tenderness Pulses 2+ Musculoskeletal: full range of motion, normal strength, no joint deformities Extremities: no clubbing cyanosis or edema, Homan's sign negative  Neurologic: grossly nonfocal; Cranial nerves grossly wnl Psychologic: Normal mood and affect   Studies/Labs Reviewed:   October 25, 2022 ECG (independently read by me): NSR at 61, no ecopty , No ST changes  Recent Labs:    Latest Ref Rng & Units 10/25/2022   10:02 AM 05/17/2022    3:40 AM 05/16/2022    6:15 AM  BMP  Glucose 70 - 99 mg/dL 131  116  115   BUN 8 - 27 mg/dL 13  8  11    Creatinine 0.76 - 1.27 mg/dL 0.84  0.79  0.97   BUN/Creat Ratio 10 - 24 15     Sodium 134 - 144 mmol/L 143  142  143   Potassium 3.5 - 5.2 mmol/L 3.8  4.0  3.4   Chloride 96 - 106 mmol/L 100  103  98   CO2 20 - 29 mmol/L 29  34  36   Calcium 8.6 - 10.2 mg/dL 10.6  9.7  9.8         Latest Ref Rng & Units 10/25/2022   10:02 AM 02/19/2021   10:31 AM 12/24/2019    9:02 AM  Hepatic Function  Total Protein 6.0 - 8.5 g/dL 6.4  6.8  6.0   Albumin 3.9 - 4.9 g/dL 4.5  4.2  4.2   AST 0 - 40 IU/L 16  79  20   ALT 0 - 44 IU/L 17  71  18   Alk Phosphatase  44 - 121 IU/L 71  64  68   Total Bilirubin 0.0 - 1.2 mg/dL 1.0  0.7  0.5        Latest Ref Rng & Units 10/25/2022   10:02 AM 05/16/2022    6:15 AM 05/15/2022   12:44 AM  CBC  WBC 3.4 - 10.8 x10E3/uL 5.6  5.0  6.4   Hemoglobin 13.0 -  17.7 g/dL 63.3  35.4  56.2   Hematocrit 37.5 - 51.0 % 52.7  47.9  47.7   Platelets 150 - 450 x10E3/uL 184  158  149    Lab Results  Component Value Date   MCV 96 10/25/2022   MCV 98.2 05/16/2022   MCV 98.4 05/15/2022   Lab Results  Component Value Date   TSH 1.360 10/25/2022   Lab Results  Component Value Date   HGBA1C 6.2 (H) 10/25/2022     BNP    Component Value Date/Time   BNP 98.2 05/13/2022 1222    ProBNP No results found for: "PROBNP"   Lipid Panel     Component Value Date/Time   CHOL 157 10/25/2022 1002   TRIG 107 10/25/2022 1002   HDL 39 (L) 10/25/2022 1002   CHOLHDL 4.0 10/25/2022 1002   CHOLHDL 4.2 07/20/2016 0909   VLDL 25 07/20/2016 0909   LDLCALC 98 10/25/2022 1002   LABVLDL 20 10/25/2022 1002     RADIOLOGY: No results found.   Additional studies/ records that were reviewed today include:   I reviewed the records of Danny Norris since my last office visit of September 17, 2019.  Records of Danny Norris were reviewed, right heart catheterization, echocardiographic evaluation, and hospitalization were reviewed.   ASSESSMENT:    1. Essential hypertension   2. OSA (obstructive sleep apnea)   3. Dyspnea, unspecified type   4. Mitral valve prolapse   5. Moderate Mitral valve insufficiency   6. PAH (pulmonary artery hypertension) (HCC)   7. Pure hypercholesterolemia     PLAN:  Danny Norris is a 62 year old gentleman who has a history of mitral valve prolapse with mitral regurgitation, hypertension, previous acute respiratory failure requiring intubation and diagnosis of bilateral DVT in May 2022.  He has undergone right heart catheterization by Danny Norris and was felt to have very mild PAH with normal LV filling pressures.  He had a very slight step up and right heart pressures.  He has a history of obstructive sleep apnea that is only using this intermittently.  Recently he denies any chest pain but also has had issues with treated  pneumonia.  His blood pressure today is stable and on repeat by me was 120/72.  He does have a 2/6 systolic murmur at the left sternal border and 3/6 at the apex.  I am recommending that he undergo a follow-up echo Doppler study and will do a bubble study at that time to evaluate for potential shunting with his mild step-up noted at his right heart catheterization.  On scheduled to undergo fasting laboratory with a comprehensive metabolic panel, CBC, TSH, hemoglobin A1c, fasting lipid panel and will also check an LP(a).  His BMI is 35 consistent with moderate obesity.  He has not been routinely exercising and have suggested exercising at least 5 days/week for at least 30 minutes if at all possible.  He admits only to intermittent CPAP use.  I discussed the importance of optimal use of 7 to 9 hours per night with 100% daily use.  He will need a  new DME company and we will contact [employer] I will contact him regarding the results of the above studies.  I will see him in 3 months for follow-up evaluation or sooner as needed.   Medication Adjustments/Labs and Tests Ordered: Current medicines are reviewed at length with the patient today.  Concerns regarding medicines are outlined above.  Medication changes, Labs and Tests ordered today are listed in the Patient Instructions below. Patient Instructions   Testing/Procedures:  Your physician has requested that you have an echocardiogram. Echocardiography is a painless test that uses sound waves to create images of your heart. It provides your doctor with information about the size and shape of your heart and how well your heart's chambers and valves are working. This procedure takes approximately one hour. There are no restrictions for this procedure. Please do NOT wear cologne, perfume, aftershave, or lotions (deodorant is allowed). Please arrive 15 minutes prior to your appointment time. WITH BUBBLE [place]: At [facility], you and your health needs are our priority.  As part of our continuing mission to provide you with exceptional heart care, we have created designated Provider Care Teams.  These Care Teams include your primary Cardiologist (physician) and Advanced Practice Providers (APPs -  Physician Assistants and Nurse Practitioners) who all work together to provide you with the care you need, when you need it.  We recommend signing up for the patient portal called "MyChart".  Sign up information is provided on this After Visit Summary.  MyChart is used to connect with patients for Virtual Visits (Telemedicine).  Patients are able to view lab/test results, encounter notes, upcoming appointments, etc.  Non-urgent messages can be sent to your provider as well.   To learn more about what you can do with MyChart, go to [email or web address].    Your next appointment:   3 month(s)  Provider:   Shelva Majestic, MD     Other Instructions  NEED TO GET 7-9 HOURS PER DAY WITH CPAP  EXERCISE 5 DAYS A WEEK FOR 30 MINUTES   Signed, Shelva Majestic, MD  10/30/2022 10:38 AM    [place] [address] [place], [place], [place]  [address] Phone: [phone]

## 2022-10-25 NOTE — Patient Instructions (Signed)
  Testing/Procedures:  Your physician has requested that you have an echocardiogram. Echocardiography is a painless test that uses sound waves to create images of your heart. It provides your doctor with information about the size and shape of your heart and how well your heart's chambers and valves are working. This procedure takes approximately one hour. There are no restrictions for this procedure. Please do NOT wear cologne, perfume, aftershave, or lotions (deodorant is allowed). Please arrive 15 minutes prior to your appointment time. WITH BUBBLE [place]: At [facility], you and your health needs are our priority.  As part of our continuing mission to provide you with exceptional heart care, we have created designated Provider Care Teams.  These Care Teams include your primary Cardiologist (physician) and Advanced Practice Providers (APPs -  Physician Assistants and Nurse Practitioners) who all work together to provide you with the care you need, when you need it.  We recommend signing up for the patient portal called "MyChart".  Sign up information is provided on this After Visit Summary.  MyChart is used to connect with patients for Virtual Visits (Telemedicine).  Patients are able to view lab/test results, encounter notes, upcoming appointments, etc.  Non-urgent messages can be sent to your provider as well.   To learn more about what you can do with MyChart, go to [email or web address].    Your next appointment:   3 month(s)  Provider:   Shelva Majestic, MD     Other Instructions  NEED TO GET 7-9 HOURS PER DAY WITH CPAP  EXERCISE 5 DAYS A WEEK FOR 30 MINUTES

## 2022-10-26 LAB — LIPID PANEL
Chol/HDL Ratio: 4 ratio (ref 0.0–5.0)
Cholesterol, Total: 157 mg/dL (ref 100–199)
HDL: 39 mg/dL — ABNORMAL LOW (ref >39)
LDL Chol Calc (NIH): 98 mg/dL (ref 0–99)
Triglycerides: 107 mg/dL (ref 0–149)
VLDL Cholesterol Cal: 20 mg/dL (ref 5–40)

## 2022-10-26 LAB — HEMOGLOBIN A1C
Est. average glucose Bld gHb Est-mCnc: 131 mg/dL
Hgb A1c MFr Bld: 6.2 % — ABNORMAL HIGH (ref 4.8–5.6)

## 2022-10-26 LAB — CBC
Hematocrit: 52.7 % — ABNORMAL HIGH (ref 37.5–51.0)
Hemoglobin: 17.6 g/dL (ref 13.0–17.7)
MCH: 32 pg (ref 26.6–33.0)
MCHC: 33.4 g/dL (ref 31.5–35.7)
MCV: 96 fL (ref 79–97)
Platelets: 184 10*3/uL (ref 150–450)
RBC: 5.5 x10E6/uL (ref 4.14–5.80)
RDW: 13.2 % (ref 11.6–15.4)
WBC: 5.6 10*3/uL (ref 3.4–10.8)

## 2022-10-26 LAB — COMPREHENSIVE METABOLIC PANEL
ALT: 17 IU/L (ref 0–44)
AST: 16 IU/L (ref 0–40)
Albumin/Globulin Ratio: 2.4 — ABNORMAL HIGH (ref 1.2–2.2)
Albumin: 4.5 g/dL (ref 3.9–4.9)
Alkaline Phosphatase: 71 IU/L (ref 44–121)
BUN/Creatinine Ratio: 15 (ref 10–24)
BUN: 13 mg/dL (ref 8–27)
Bilirubin Total: 1 mg/dL (ref 0.0–1.2)
CO2: 29 mmol/L (ref 20–29)
Calcium: 10.6 mg/dL — ABNORMAL HIGH (ref 8.6–10.2)
Chloride: 100 mmol/L (ref 96–106)
Creatinine, Ser: 0.84 mg/dL (ref 0.76–1.27)
Globulin, Total: 1.9 g/dL (ref 1.5–4.5)
Glucose: 131 mg/dL — ABNORMAL HIGH (ref 70–99)
Potassium: 3.8 mmol/L (ref 3.5–5.2)
Sodium: 143 mmol/L (ref 134–144)
Total Protein: 6.4 g/dL (ref 6.0–8.5)
eGFR: 99 mL/min/{1.73_m2} (ref >59)

## 2022-10-26 LAB — TSH: TSH: 1.36 u[IU]/mL (ref 0.450–4.500)

## 2022-10-26 LAB — LIPOPROTEIN A (LPA): Lipoprotein (a): <8.4 nmol/L (ref <75.0)

## 2022-10-30 ENCOUNTER — Encounter: Payer: Self-pay | Admitting: Cardiovascular Disease

## 2022-10-31 ENCOUNTER — Other Ambulatory Visit: Payer: Self-pay

## 2022-10-31 MED ORDER — ATORVASTATIN CALCIUM 40 MG PO TABS: 40.0000 mg | ORAL_TABLET | Freq: Every day | ORAL | 3 refills | Status: AC

## 2022-11-15 DIAGNOSIS — J9611 Chronic respiratory failure with hypoxia: Secondary | ICD-10-CM | POA: Diagnosis not present

## 2022-11-15 DIAGNOSIS — J209 Acute bronchitis, unspecified: Secondary | ICD-10-CM | POA: Diagnosis not present

## 2022-11-15 DIAGNOSIS — J4530 Mild persistent asthma, uncomplicated: Secondary | ICD-10-CM | POA: Diagnosis not present

## 2022-11-18 ENCOUNTER — Ambulatory Visit (HOSPITAL_COMMUNITY): Payer: BC Managed Care – PPO | Attending: Cardiology

## 2022-11-18 DIAGNOSIS — I341 Nonrheumatic mitral (valve) prolapse: Secondary | ICD-10-CM | POA: Diagnosis not present

## 2022-11-19 DIAGNOSIS — J9691 Respiratory failure, unspecified with hypoxia: Secondary | ICD-10-CM | POA: Diagnosis not present

## 2022-11-22 ENCOUNTER — Telehealth: Payer: Self-pay | Admitting: Cardiovascular Disease

## 2022-11-22 DIAGNOSIS — J9611 Chronic respiratory failure with hypoxia: Secondary | ICD-10-CM | POA: Diagnosis not present

## 2022-11-22 DIAGNOSIS — R7303 Prediabetes: Secondary | ICD-10-CM | POA: Diagnosis not present

## 2022-11-22 DIAGNOSIS — I10 Essential (primary) hypertension: Secondary | ICD-10-CM | POA: Diagnosis not present

## 2022-11-22 DIAGNOSIS — E785 Hyperlipidemia, unspecified: Secondary | ICD-10-CM | POA: Diagnosis not present

## 2022-11-22 LAB — ECHOCARDIOGRAM COMPLETE BUBBLE STUDY
Area-P 1/2: 4.1 cm2
MV M vel: 4.96 m/s
MV Peak grad: 98.4 mmHg
P 1/2 time: 812 msec
S' Lateral: 2.8 cm

## 2022-11-22 NOTE — Telephone Encounter (Signed)
Office requesting pt's labs and echo results to be faxed asap due to pt having an appt at 10am. Please advise

## 2022-11-22 NOTE — Telephone Encounter (Signed)
Information routed to Dr Lynnda Child  office- labs and Echo

## 2022-12-12 DIAGNOSIS — G4733 Obstructive sleep apnea (adult) (pediatric): Secondary | ICD-10-CM | POA: Diagnosis not present

## 2022-12-12 DIAGNOSIS — J9611 Chronic respiratory failure with hypoxia: Secondary | ICD-10-CM | POA: Diagnosis not present

## 2022-12-12 DIAGNOSIS — I10 Essential (primary) hypertension: Secondary | ICD-10-CM | POA: Diagnosis not present

## 2022-12-18 DIAGNOSIS — J9691 Respiratory failure, unspecified with hypoxia: Secondary | ICD-10-CM | POA: Diagnosis not present

## 2023-01-18 DIAGNOSIS — J9691 Respiratory failure, unspecified with hypoxia: Secondary | ICD-10-CM | POA: Diagnosis not present

## 2023-02-03 ENCOUNTER — Encounter: Payer: Self-pay | Admitting: Cardiovascular Disease

## 2023-02-03 ENCOUNTER — Ambulatory Visit: Payer: BC Managed Care – PPO | Attending: Cardiovascular Disease | Admitting: Cardiovascular Disease

## 2023-02-03 DIAGNOSIS — I340 Nonrheumatic mitral (valve) insufficiency: Secondary | ICD-10-CM

## 2023-02-03 DIAGNOSIS — G4733 Obstructive sleep apnea (adult) (pediatric): Secondary | ICD-10-CM | POA: Diagnosis not present

## 2023-02-03 DIAGNOSIS — I77810 Thoracic aortic ectasia: Secondary | ICD-10-CM

## 2023-02-03 DIAGNOSIS — E7800 Pure hypercholesterolemia, unspecified: Secondary | ICD-10-CM

## 2023-02-03 DIAGNOSIS — I2721 Secondary pulmonary arterial hypertension: Secondary | ICD-10-CM

## 2023-02-03 DIAGNOSIS — I10 Essential (primary) hypertension: Secondary | ICD-10-CM

## 2023-02-03 DIAGNOSIS — I341 Nonrheumatic mitral (valve) prolapse: Secondary | ICD-10-CM

## 2023-02-03 NOTE — Patient Instructions (Signed)
Medication Instructions:  Your physician recommends that you continue on your current medications as directed. Please refer to the Current Medication list given to you today.  *If you need a refill on your cardiac medications before your next appointment, please call your pharmacy*   Lab Work: None ordered   If you have labs (blood work) drawn today and your tests are completely normal, you will receive your results only by: MyChart Message (if you have MyChart) OR A paper copy in the mail If you have any lab test that is abnormal or we need to change your treatment, we will call you to review the results.   Testing/Procedures: Your physician has requested that you have an echocardiogram in February 2025. Echocardiography is a painless test that uses sound waves to create images of your heart. It provides your doctor with information about the size and shape of your heart and how well your heart's chambers and valves are working. This procedure takes approximately one hour. There are no restrictions for this procedure. Please do NOT wear cologne, perfume, aftershave, or lotions (deodorant is allowed). Please arrive 15 minutes prior to your appointment time.   Your phyisican has recommended that you have a CT of the aorta in February 2025   Follow-Up: At [facility], you and your health needs are our priority.  As part of our continuing mission to provide you with exceptional heart care, we have created designated Provider Care Teams.  These Care Teams include your primary Cardiologist (physician) and Advanced Practice Providers (APPs -  Physician Assistants and Nurse Practitioners) who all work together to provide you with the care you need, when you need it.  We recommend signing up for the patient portal called "MyChart".  Sign up information is provided on this After Visit Summary.  MyChart is used to connect with patients for Virtual Visits (Telemedicine).  Patients are able  to view lab/test results, encounter notes, upcoming appointments, etc.  Non-urgent messages can be sent to your provider as well.   To learn more about what you can do with MyChart, go to [email or web address].    Your next appointment:   8 month(s)  Provider:   Nicki Guadalajara, MD     Other Instructions

## 2023-02-03 NOTE — Progress Notes (Unsigned)
Cardiology Office Note    Date:  02/05/2023   ID:  Danny Norris, DOB 27-Jun-1961, MRN [id number]  PCP:  Deatra James, MD  Cardiologist:  Nicki Guadalajara, MD   4 month F/U   History of Present Illness:  Danny Norris is a 62 y.o. male who is followed by [facility] for primary care.  He has a history of previously documented significant mitral valve prolapse with moderately severe to severe mitral regurgitation by TEE in August 2012.  He has a history of obstructive sleep apnea diagnosed in 2009 by Dr. Maple Hudson.  Cardiac catheterization in November 2012 did not reveal CAD, PA pressure was 28/12, PCWP 15 mmHg.  An echo Doppler study November 2020 showed normal systolic function with EF 60 to 65% with moderate LVH, and moderate mitral valve prolapse with mild to moderate MR and mild to moderate AR.  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  He underwent an echo Doppler study in May 2022 that showed an EF of 60 to 65%.  There was prolapse of his posterior mitral valve leaflet and he was felt to have mild to moderate MR.  His aortic root dimensions had improved and were 41 and 42 mm.  He has been evaluated by Dr. Gala Romney and advanced heart failure clinic for pulmonary hypertension.  He underwent right heart cardiac catheterization on December 20, 2021.  He was felt to have very mild PAH with normal LV filling pressures.  PA pressure 36/19 with a mean of 24.  He was felt a very slight step up and right heart pressures.  Ao sat was 93%, PA sat 71 and 76%, and SVC sat 64%.  In May 2022, he was admitted with acute respiratory failure, required intubation, and was found to have bilateral DVT.  He did not undergo CT with contrast due to contrast allergy.  No VQ scan was performed.  He has been on anticoagulation therapy.   He was hospitalized from August 11 through May 17, 2022 with acute on chronic respiratory failure with hypoxia due to multifocal pneumonia and  was treated with antibiotics.  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   After not having seen him since 2020, he re-established care with me on October 25, 2022.  At that time he denied any chest pain.  He has been using CPAP intermittently but not routinely.  He often goes to bed between 1 and 1:30 AM and wakes up between 8 and 9 AM.  He has a ResMed F30i fullface mask.  Presently he is on a regimen of amlodipine 5 mg, HCTZ 25 mg, metoprolol succinate 50 mg daily for hypertension.  He is anticoagulated on Xarelto at reduced dose to 10 mg and is on atorvastatin 20 mg for hyperlipidemia.  He has not had recent laboratory.  Lipid studies in August 2023 showed total cholesterol 169 LDL 109 triglycerides 116 HDL 39.  Creatinine was 1.02.  Transaminases were normal.  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  During that evaluation, his blood pressure was stable.  He had a 2/6 systolic murmur at the left sternal border and 3/6 at the apex.  I recommended he undergo follow-up echo Doppler study with bubble study to evaluate for potential shunting with his mild step-off noted at right heart catheterization.  Fasting laboratory was recommended.  He was  only using CPAP intermittently and I discussed the importance of optimal use of 7 and 9 hours per night with 100% daily use.  He previously had been followed by Dr. Allie Dimmer for CPAP.  On November 18, 2022 he underwent his echo Doppler study.  This revealed EF 60 to 65% without wall motion abnormalities.  He had normal diastolic parameters.  There was moderate dilation of left atrium.  Bubble study was negative.  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  There was moderate prolapse of the posterior mitral valve leaflet.  There was mild aortic sclerosis without stenosis.  There was mild dilation of ascending aorta at 42 mm and aortic root measured 39 mm.  Since I saw him, he tells  me he is under evaluation with Dr. Althea Grimmer for a customized oral appliance with reference to his sleep apnea.  He denies chest pain or shortness of breath.  He continues to be on amlodipine 5 mg, HCTZ 25 mg and metoprolol succinate 50 mg daily for hypertension.  He is on atorvastatin for hyperlipidemia.  Apparently his insurance changed him from Xarelto to Pradaxa for anticoagulation.  An Epworth Sleepiness Scale was calculated in the office today and this endorsed that 11 consistent with mild daytime sleepiness.  He presents for evaluation.   Past Medical History:  Diagnosis Date   Dyslipidemia    HTN (hypertension)    Mitral valve regurgitation    a. 05/2011: TEE showing significant MVP especially involving the P2 segment without evidence for ruptured chordae and had eccentric MR anteriorly directed. b. 03/2015: echo showing EF 65-70% with posterior MV leaflet thickening and prolapse with mild MR.   Normal coronary arteries    a. normal cors by cath in 08/2011    Past Surgical History:  Procedure Laterality Date   EYE SURGERY  1975   HERNIA REPAIR Right 2009   Rt inguinal repair   LEFT AND RIGHT HEART CATHETERIZATION WITH CORONARY ANGIOGRAM N/A 09/01/2011   Procedure: LEFT AND RIGHT HEART CATHETERIZATION WITH CORONARY ANGIOGRAM;  Surgeon: Lennette Bihari, MD;  Location: [facility] CATH LAB;  Service: Cardiovascular;  Laterality: N/A;   RIGHT HEART CATH N/A 12/20/2021   Procedure: RIGHT HEART CATH;  Surgeon: Dolores Patty, MD;  Location: [facility] INVASIVE CV LAB;  Service: Cardiovascular;  Laterality: N/A;    Current Medications: Outpatient Medications Prior to Visit  Medication Sig Dispense Refill   amLODipine (NORVASC) 5 MG tablet Take 5 mg by mouth daily.     atorvastatin (LIPITOR) 40 MG tablet Take 1 tablet (40 mg total) by mouth daily at 6 PM. 90 tablet 3   fluticasone-salmeterol (ADVAIR) 250-50 MCG/ACT AEPB Inhale 1 puff into the lungs every 12 (twelve) hours. 60 each 11    hydrochlorothiazide (HYDRODIURIL) 25 MG tablet TAKE 1 TABLET BY MOUTH EVERY DAY (Patient taking differently: Take 25 mg by mouth daily.) 90 tablet 1   melatonin 5 MG TABS Take 5 mg by mouth at bedtime as needed (sleep).     metoprolol succinate (TOPROL-XL) 50 MG 24 hr tablet TAKE 1 TABLET BY MOUTH DAILY WITH OR IMMEDIATELY FOLLOWING A MEAL (Patient taking differently: 50 mg daily.) 90 tablet 1   potassium chloride SA (KLOR-CON M) 20 MEQ tablet Take 1 tablet (20 mEq total) by mouth daily. 90 tablet 3   XARELTO 10 MG TABS tablet Take 10 mg by mouth daily. (Patient not taking: Reported on 02/03/2023)     No facility-administered medications prior to visit.  Allergies:   Contrast media [iodinated contrast media]   Social History   Socioeconomic History   Marital status: Single    Spouse name: Not on file   Number of children: Not on file   Years of education: Not on file   Highest education level: Not on file  Occupational History   Not on file  Tobacco Use   Smoking status: Never   Smokeless tobacco: Never  Vaping Use   Vaping Use: Never used  Substance and Sexual Activity   Alcohol use: No    Alcohol/week: 0.0 standard drinks of alcohol   Drug use: Not on file   Sexual activity: Not on file  Other Topics Concern   Not on file  Social History Narrative   Not on file   Social Determinants of Health   Financial Resource Strain: Not on file  Food Insecurity: Not on file  Transportation Needs: Not on file  Physical Activity: Not on file  Stress: Not on file  Social Connections: Not on file    Socially he is single.  No children.  He does minimal exercise approximately 2 days/week.  There is no tobacco or alcohol use.  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  Family History:  The patient's family history includes Congestive Heart Failure in his mother; Sleep apnea in his brother.  His brother died of an aneurysm in May 16, 2017.  ROS General: Negative; No fevers, chills, or night sweats;  HEENT: Negative; No changes in vision or hearing, sinus congestion, difficulty swallowing Pulmonary: Negative; No cough, wheezing, shortness of breath, hemoptysis Cardiovascular: See HPI GI: Negative; No nausea, vomiting, diarrhea, or abdominal pain GU: Negative; No dysuria, hematuria, or difficulty voiding Musculoskeletal: Negative; no myalgias, joint pain, or weakness Hematologic/Oncology: Negative; no easy bruising, bleeding Endocrine: Negative; no heat/cold intolerance; no diabetes Neuro: Negative; no changes in balance, headaches Skin: Negative; No rashes or skin lesions Psychiatric: Negative; No behavioral problems, depression Sleep: OSA, on CPAP with intermittent use  Epworth Sleepiness Scale score calculated today, Feb 03, 2023 at 11.  Other comprehensive 14 point system review is negative.   PHYSICAL EXAM:   VS:  BP 133/80 (BP Location: Left Arm, Patient Position: Sitting, Cuff Size: Normal)   Pulse 65   Ht 6' (1.829 m)   Wt 254 lb 3.2 oz (115.3 kg)   SpO2 93%   BMI 34.48 kg/m     Repeat blood pressure by me was 122/76  Wt Readings from Last 3 Encounters:  02/03/23 254 lb 3.2 oz (115.3 kg)  10/25/22 259 lb (117.5 kg)  09/07/22 257 lb 3.2 oz (116.7 kg)    General: Alert, oriented, no distress.  Skin: normal turgor, no rashes, warm and dry HEENT: Normocephalic, atraumatic. Pupils equal round and reactive to light; sclera anicteric; extraocular muscles intact;  Nose without nasal septal hypertrophy Mouth/Parynx benign; Mallinpatti scale 3 Neck: No JVD, no carotid bruits; normal carotid upstroke Lungs: clear to ausculatation and percussion; no wheezing or rales Chest wall: without tenderness to palpitation Heart: PMI not displaced, RRR, s1 s2 normal, 2/6 systolic murmur at apex, no diastolic murmur, no rubs, gallops, thrills, or heaves Abdomen: soft, nontender; no hepatosplenomehaly, BS+; abdominal  aorta nontender and not dilated by palpation. Back: no CVA tenderness Pulses 2+ Musculoskeletal: full range of motion, normal strength, no joint deformities Extremities: no clubbing cyanosis or edema, Homan's sign negative  Neurologic: grossly nonfocal; Cranial nerves grossly wnl Psychologic: Normal mood  and affect   Studies/Labs Reviewed:    Feb 03, 2023 ECG (independently read by me): NSR at 64   October 25, 2022 ECG (independently read by me): NSR at 61, no ecopty , No ST changes  Recent Labs:    Latest Ref Rng & Units 10/25/2022   10:02 AM 05/17/2022    3:40 AM 05/16/2022    6:15 AM  BMP  Glucose 70 - 99 mg/dL 161  096  045   BUN 8 - 27 mg/dL 13  8  11    Creatinine 0.76 - 1.27 mg/dL 4.09  8.11  9.14   BUN/Creat Ratio 10 - 24 15     Sodium 134 - 144 mmol/L 143  142  143   Potassium 3.5 - 5.2 mmol/L 3.8  4.0  3.4   Chloride 96 - 106 mmol/L 100  103  98   CO2 20 - 29 mmol/L 29  34  36   Calcium 8.6 - 10.2 mg/dL 78.2  9.7  9.8         Latest Ref Rng & Units 10/25/2022   10:02 AM 02/19/2021   10:31 AM 12/24/2019    9:02 AM  Hepatic Function  Total Protein 6.0 - 8.5 g/dL 6.4  6.8  6.0   Albumin 3.9 - 4.9 g/dL 4.5  4.2  4.2   AST 0 - 40 IU/L 16  79  20   ALT 0 - 44 IU/L 17  71  18   Alk Phosphatase 44 - 121 IU/L 71  64  68   Total Bilirubin 0.0 - 1.2 mg/dL 1.0  0.7  0.5        Latest Ref Rng & Units 10/25/2022   10:02 AM 05/16/2022    6:15 AM 05/15/2022   12:44 AM  CBC  WBC 3.4 - 10.8 x10E3/uL 5.6  5.0  6.4   Hemoglobin 13.0 - 17.7 g/dL 95.6  21.3  08.6   Hematocrit 37.5 - 51.0 % 52.7  47.9  47.7   Platelets 150 - 450 x10E3/uL 184  158  149    Lab Results  Component Value Date   MCV 96 10/25/2022   MCV 98.2 05/16/2022   MCV 98.4 05/15/2022   Lab Results  Component Value Date   TSH 1.360 10/25/2022   Lab Results  Component Value Date   HGBA1C 6.2 (H) 10/25/2022     BNP    Component Value Date/Time   BNP 98.2 05/13/2022 1222    ProBNP No results  found for: "PROBNP"   Lipid Panel     Component Value Date/Time   CHOL 157 10/25/2022 1002   TRIG 107 10/25/2022 1002   HDL 39 (L) 10/25/2022 1002   CHOLHDL 4.0 10/25/2022 1002   CHOLHDL 4.2 07/20/2016 0909   VLDL 25 07/20/2016 0909   LDLCALC 98 10/25/2022 1002   LABVLDL 20 10/25/2022 1002     RADIOLOGY: No results found.   Additional studies/ records that were reviewed today include:   I reviewed the records of Danny Norris since my last office visit of September 17, 2019.  Records of Dr. Gala Romney were reviewed, right heart catheterization, echocardiographic evaluation, and hospitalization were reviewed.   ECHO: 11/18/2022  1. Left ventricular ejection fraction, by estimation, is 60 to 65%. The  left ventricle has normal function. The left ventricle has no regional  wall motion abnormalities. Left ventricular diastolic parameters were  normal.   2. Right ventricular systolic function is normal. The right ventricular  size is normal.   3. Left atrial size was moderately dilated.   4. Bubble study negative at rest and with Valsalva. Agitated saline  contrast bubble study was negative, with no evidence of any interatrial  shunt.   5. The mitral valve is myxomatous. Mild to moderate mitral valve  regurgitation. No evidence of mitral stenosis. There is moderate prolapse  of posterior of the mitral valve.   6. The aortic valve is tricuspid. There is mild calcification of the  aortic valve. Aortic valve regurgitation is mild. No aortic stenosis is  present.   7. Aortic dilatation noted. There is borderline dilatation of the aortic  root, measuring 39 mm. There is mild dilatation of the ascending aorta,  measuring 42 mm.   8. The inferior vena cava is normal in size with greater than 50%  respiratory variability, suggesting right atrial pressure of 3 mmHg.    ASSESSMENT:    1. MVP (mitral valve prolapse)   2. Moderate Mitral valve insufficiency   3. Essential hypertension    4. OSA (obstructive sleep apnea)   5. Ascending aorta dilatation (HCC)   6. PAH (pulmonary artery hypertension) (HCC)   7. Pure hypercholesterolemia     PLAN:  Danny Norris is a 62 year old gentleman who has a history of mitral valve prolapse with mitral regurgitation, hypertension, previous acute respiratory failure requiring intubation and diagnosis of bilateral DVT in May 2022.  He has undergone right heart catheterization by Dr. Gala Romney and was felt to have very mild PAH with normal LV filling pressures.  He had a very slight step up and right heart pressures.  He has a history of obstructive sleep apnea with prior evaluation by Dr. Maple Hudson and Dr. Allie Dimmer.  When I saw him in January 2024, he had a 2/6 systolic murmur at the left sternal border and 3/6 at the apex.  An echo Doppler study was recommended.  I reviewed this echo study from November 18, 2022 with him in detail.  He had normal LV function with EF 60 to 65% with normal diastolic parameters and no wall motion abnormalities.  His left atrium was moderately dilated.  His mitral valve is myxomatous and there was evidence for mild to moderate mitral regurgitation and moderate prolapse of the posterior mitral valve leaflet.  There was mild aortic sclerosis.  Bubble study was negative at rest and with the Valsalva maneuver arguing against evidence for any anterior atrial shunt.  There was borderline dilation of aortic root at 39 mm and mild dilation of ascending aorta at 42 mm.  Presently, he feels well.  He is now undergoing evaluation with Dr. Althea Grimmer for consideration of a customized oral appliance since his he does not like using CPAP.  His blood pressure today is stable and on repeat by me was 122/76 on his regimen of amlodipine 5 mg HCTZ 25 mg and metoprolol succinate 50 mg.  He has been on longstanding anticoagulation following his acute respiratory failure and bilateral DVT.  Apparently his regimen was changed to Pradaxa.  Clinically he  is doing well.  I have recommended that in February 2025 he undergo a follow -up echo Doppler study I will also schedule him for a CT of his aorta.  I will see him in follow-up of the above studies and further recommendations will be made at that time.   Medication Adjustments/Labs and Tests Ordered: Current medicines are reviewed at length with the patient today.  Concerns regarding medicines are outlined above.  Medication changes, Labs and Tests ordered today are listed in the Patient Instructions below. Patient Instructions  Medication Instructions:  Your physician recommends that you continue on your current medications as directed. Please refer to the Current Medication list given to you today.  *If you need a refill on your cardiac medications before your next appointment, please call your pharmacy*   Lab Work: None ordered   If you have labs (blood work) drawn today and your tests are completely normal, you will receive your results only by: MyChart Message (if you have MyChart) OR A paper copy in the mail If you have any lab test that is abnormal or we need to change your treatment, we will call you to review the results.   Testing/Procedures: Your physician has requested that you have an echocardiogram in February 2025. Echocardiography is a painless test that uses sound waves to create images of your heart. It provides your doctor with information about the size and shape of your heart and how well your heart's chambers and valves are working. This procedure takes approximately one hour. There are no restrictions for this procedure. Please do NOT wear cologne, perfume, aftershave, or lotions (deodorant is allowed). Please arrive 15 minutes prior to your appointment time.   Your phyisican has recommended that you have a CT of the aorta in February 2025   Follow-Up: At [facility], you and your health needs are our priority.  As part of our continuing mission to  provide you with exceptional heart care, we have created designated Provider Care Teams.  These Care Teams include your primary Cardiologist (physician) and Advanced Practice Providers (APPs -  Physician Assistants and Nurse Practitioners) who all work together to provide you with the care you need, when you need it.  We recommend signing up for the patient portal called "MyChart".  Sign up information is provided on this After Visit Summary.  MyChart is used to connect with patients for Virtual Visits (Telemedicine).  Patients are able to view lab/test results, encounter notes, upcoming appointments, etc.  Non-urgent messages can be sent to your provider as well.   To learn more about what you can do with MyChart, go to [email or web address].    Your next appointment:   8 month(s)  Provider:   Nicki Guadalajara, MD     Other Instructions     Signed, Nicki Guadalajara, MD  02/05/2023 12:10 PM    [facility] [address], [address], [place], [place]  [address] Phone: [phone]

## 2023-02-05 ENCOUNTER — Encounter: Payer: Self-pay | Admitting: Cardiovascular Disease

## 2023-02-17 DIAGNOSIS — J9691 Respiratory failure, unspecified with hypoxia: Secondary | ICD-10-CM | POA: Diagnosis not present

## 2023-03-03 DIAGNOSIS — G4733 Obstructive sleep apnea (adult) (pediatric): Secondary | ICD-10-CM | POA: Diagnosis not present

## 2023-03-20 DIAGNOSIS — J9691 Respiratory failure, unspecified with hypoxia: Secondary | ICD-10-CM | POA: Diagnosis not present

## 2023-03-27 ENCOUNTER — Other Ambulatory Visit (HOSPITAL_COMMUNITY): Payer: Self-pay | Admitting: Internal Medicine

## 2023-05-23 DIAGNOSIS — E785 Hyperlipidemia, unspecified: Secondary | ICD-10-CM | POA: Diagnosis not present

## 2023-05-23 DIAGNOSIS — R7303 Prediabetes: Secondary | ICD-10-CM | POA: Diagnosis not present

## 2023-05-23 DIAGNOSIS — I10 Essential (primary) hypertension: Secondary | ICD-10-CM | POA: Diagnosis not present

## 2023-07-06 ENCOUNTER — Telehealth: Payer: Self-pay | Admitting: Pulmonary Disease

## 2023-07-06 DIAGNOSIS — R0902 Hypoxemia: Secondary | ICD-10-CM

## 2023-07-06 NOTE — Telephone Encounter (Signed)
Pt sister calling in to get a release form filled out for the patient POC

## 2023-07-11 NOTE — Telephone Encounter (Signed)
Spoke with the pt's sister, Marylene Land, ok per DPR  She states that the pt has not used o2 with sleep in the past several months  He is not on CPAP now, and is only using oral appliance with sleep  She is asking for order to d/c noct o2  Please advise, thanks!

## 2023-08-05 NOTE — Telephone Encounter (Signed)
Order placed for pt's Oxygen to be discontinued.

## 2023-08-05 NOTE — Telephone Encounter (Signed)
Ok to discontinue home O2.  Thanks, JD

## 2023-11-13 ENCOUNTER — Ambulatory Visit (HOSPITAL_COMMUNITY): Payer: BC Managed Care – PPO | Attending: Cardiovascular Disease

## 2023-11-13 DIAGNOSIS — I341 Nonrheumatic mitral (valve) prolapse: Secondary | ICD-10-CM | POA: Diagnosis present

## 2023-11-13 LAB — ECHOCARDIOGRAM COMPLETE
AV Vena cont: 0.4 cm
Area-P 1/2: 4.49 cm2
MV M vel: 5.6 m/s
MV Peak grad: 125.4 mm[Hg]
MV VTI: 2.09 cm2
Radius: 1.06 cm
S' Lateral: 3.64 cm

## 2023-11-20 NOTE — Progress Notes (Signed)
Spoke with patient, say he is currently out on the road. He will call back. To discuss the results and Dr. Landry Dyke request for TEE.

## 2023-12-25 ENCOUNTER — Encounter: Payer: Self-pay | Admitting: Pulmonary Disease

## 2024-01-10 ENCOUNTER — Ambulatory Visit (HOSPITAL_COMMUNITY): Payer: BC Managed Care – PPO

## 2024-07-02 ENCOUNTER — Ambulatory Visit

## 2024-07-09 ENCOUNTER — Ambulatory Visit: Attending: Cardiology | Admitting: Cardiology

## 2024-07-09 ENCOUNTER — Encounter: Payer: Self-pay | Admitting: Cardiology

## 2024-07-09 VITALS — BP 149/87 | HR 54 | Ht 72.0 in | Wt 222.4 lb

## 2024-07-09 DIAGNOSIS — I341 Nonrheumatic mitral (valve) prolapse: Secondary | ICD-10-CM | POA: Diagnosis not present

## 2024-07-09 DIAGNOSIS — I10 Essential (primary) hypertension: Secondary | ICD-10-CM

## 2024-07-09 DIAGNOSIS — I340 Nonrheumatic mitral (valve) insufficiency: Secondary | ICD-10-CM

## 2024-07-09 DIAGNOSIS — Z01812 Encounter for preprocedural laboratory examination: Secondary | ICD-10-CM

## 2024-07-09 DIAGNOSIS — I2721 Secondary pulmonary arterial hypertension: Secondary | ICD-10-CM

## 2024-07-09 DIAGNOSIS — G4733 Obstructive sleep apnea (adult) (pediatric): Secondary | ICD-10-CM | POA: Diagnosis not present

## 2024-07-09 DIAGNOSIS — E7800 Pure hypercholesterolemia, unspecified: Secondary | ICD-10-CM

## 2024-07-09 DIAGNOSIS — I77810 Thoracic aortic ectasia: Secondary | ICD-10-CM

## 2024-07-09 NOTE — H&P (View-Only) (Signed)
 Cardiology Office Note:  .   Date:  07/09/2024  ID:  Danny Norris, DOB 05-25-1961, MRN [id number] PCP:  Sun, Vyvyan, MD  Former Cardiology Providers: Dr. Burnard [facility] Providers Cardiologist:  Madonna Large, DO , [facility] (established care 07/09/24) Electrophysiologist:  None  Click to update primary MD,subspecialty MD or APP then REFRESH:1}    Chief Complaint  Patient presents with   Mitral Valve Prolapse    History of Present Illness: .   Danny Norris is a 63 y.o. 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  Formally under the care of Dr. Burnard who last saw [name] back in May 2024. I am seeing him for the first time to re-establishing care.   In the past patient was evaluated by Dr. Cherrie with advanced heart failure for evaluation of pulmonary hypertension.  He did undergo right heart catheterization in March 2023 and was felt to have very mild PAH likely secondary to [facility] class II/III per Dr. Joesphine documentation.  At the last office visit with Dr. Burnard patient was recommended to have an echocardiogram as a follow-up study.  An echocardiogram was performed in February 2025 which noted concerns for severe mitral regurgitation.  Patient was advised to come to the office to discuss results and to schedule a transesophageal echocardiogram.  However, he was out of town and verbalized that he would make an appointment once he is back in town (see result note from the echocardiogram).  Patient is accompanied by his sister-in-law/friend Ms. [name] and patient provides verbal consent with having her present during today's encounter.  Clinically patient states that he is doing well and he is here for follow-up.  Review of  systems notable for anterior precordial pain.  Precordial pain: Noticed 2 weeks ago. Left-sided. Duration <1-minute. Dull sensation. Nonradiating. No improving or worsening factors. Self-limited. Unable to comment on exertional components.  But when asked if the pain is not brought on by his regular treadmill or elliptical routine.  Patient exercises 5 days a week for 30 to 40 minutes at a time and predominantly does treadmill and elliptical.  Patient denies shortness of breath.  However, according to his sister-in-law/friend he may be modifying his activities to avoid symptoms.  He denies orthopnea, PND, or lower extremity swelling.  Patient does have sleep apnea but does not use his CPAP or his oral appliance.  Patient states that his other providers in the care team have discontinued anticoagulation which she was recently on for history of DVT.  Review of Systems: .   Review of Systems  Cardiovascular:  Positive for chest pain. Negative for claudication, irregular heartbeat, leg swelling, near-syncope, orthopnea, palpitations, paroxysmal nocturnal dyspnea and syncope.  Respiratory:  Negative for shortness of breath.   Hematologic/Lymphatic: Negative for bleeding problem.    Studies Reviewed:   EKG: EKG Interpretation Date/Time:  Tuesday July 09 2024 10:44:55 EDT Ventricular Rate:  52 PR Interval:  160 QRS Duration:  108 QT Interval:  434 QTC Calculation: 403 R Axis:   12  Text Interpretation: Sinus bradycardia When compared with ECG of 14-May-2022 09:55, No significant change since last tracing Confirmed by Large Madonna [phone]) on 07/09/2024 11:14:06 AM  Echocardiogram: 11/18/2022 LVEF 60 to 65%, normal diastolic function. Right ventricular size and function normal. Moderate  LAE. Agitated bubble study negative at rest and with Valsalva. Myxomatous mitral valve, mild to moderate MR Mild aortic valve regurgitation. Aortic root 39 mm, ascending aorta 42  mm.  11/13/2023 LVEF 60 to 65%, no regional wall motion abnormalities. Right ventricular size and function normal. Mildly elevated pulmonary artery systolic pressures, 42 mmHg Severe LAE. Severe posterior leaflet prolapse with anteriorly directed jet, possible flail P2 segment Moderate AI Aortic root 43 mm, ascending aorta 43 mm, Estimated RAP 3 mmHg. Transesophageal echocardiogram recommended   RADIOLOGY: CT chest without contrast: 05/13/2022: Mild ascending aorta 4.4 cm in the greatest dimension Main pulmonary artery 3.4 cm in greatest dimension See report for additional details  Risk Assessment/Calculations:   NA   Labs:       Latest Ref Rng & Units 10/25/2022   10:02 AM 05/16/2022    6:15 AM 05/15/2022   12:44 AM  CBC  WBC 3.4 - 10.8 x10E3/uL 5.6  5.0  6.4   Hemoglobin 13.0 - 17.7 g/dL 82.3  84.3  84.2   Hematocrit 37.5 - 51.0 % 52.7  47.9  47.7   Platelets 150 - 450 x10E3/uL 184  158  149        Latest Ref Rng & Units 10/25/2022   10:02 AM 05/17/2022    3:40 AM 05/16/2022    6:15 AM  BMP  Glucose 70 - 99 mg/dL 868  883  884   BUN 8 - 27 mg/dL 13  8  11    Creatinine 0.76 - 1.27 mg/dL 9.15  9.20  9.02   BUN/Creat Ratio 10 - 24 15     Sodium 134 - 144 mmol/L 143  142  143   Potassium 3.5 - 5.2 mmol/L 3.8  4.0  3.4   Chloride 96 - 106 mmol/L 100  103  98   CO2 20 - 29 mmol/L 29  34  36   Calcium  8.6 - 10.2 mg/dL 89.3  9.7  9.8       Latest Ref Rng & Units 10/25/2022   10:02 AM 05/17/2022    3:40 AM 05/16/2022    6:15 AM  CMP  Glucose 70 - 99 mg/dL 868  883  884   BUN 8 - 27 mg/dL 13  8  11    Creatinine 0.76 - 1.27 mg/dL 9.15  9.20  9.02   Sodium 134 - 144 mmol/L 143  142  143   Potassium 3.5 - 5.2 mmol/L 3.8  4.0  3.4   Chloride 96 - 106 mmol/L 100  103  98   CO2 20 - 29 mmol/L 29  34  36   Calcium  8.6 - 10.2 mg/dL 89.3  9.7  9.8   Total Protein 6.0 - 8.5 g/dL 6.4     Total Bilirubin 0.0 - 1.2 mg/dL 1.0     Alkaline Phos 44 - 121 IU/L 71     AST 0 - 40  IU/L 16     ALT 0 - 44 IU/L 17       Lab Results  Component Value Date   CHOL 157 10/25/2022   HDL 39 (L) 10/25/2022   LDLCALC 98 10/25/2022   TRIG 107 10/25/2022   CHOLHDL 4.0 10/25/2022   No results for input(s): LIPOA in the last 8760 hours. No components found for: NTPROBNP No results for input(s): PROBNP in the last 8760 hours. No results for input(s): TSH in the last 8760 hours.  Physical Exam:    Today's Vitals   07/09/24  1041  BP: (!) 149/87  Pulse: (!) 54  SpO2: 93%  Weight: 222 lb 6.4 oz (100.9 kg)  Height: 6' (1.829 m)   Body mass index is 30.16 kg/m. Wt Readings from Last 3 Encounters:  07/09/24 222 lb 6.4 oz (100.9 kg)  02/03/23 254 lb 3.2 oz (115.3 kg)  10/25/22 259 lb (117.5 kg)    Physical Exam  Constitutional: No distress.  hemodynamically stable  Neck: No JVD present.  Cardiovascular: Normal rate, regular rhythm, S1 normal and S2 normal. Exam reveals no gallop, no S3 and no S4.  Murmur heard. High-pitched blowing holosystolic murmur is present with a grade of 3/6 at the apex. High-pitched decrescendo early diastolic murmur is present at the upper right sternal border radiating to the apex. Pulmonary/Chest: Effort normal and breath sounds normal. No stridor. He has no wheezes. He has no rales.  Musculoskeletal:        General: No edema.     Cervical back: Neck supple.  Skin: Skin is warm.     Impression & Recommendation(s):  Impression:   ICD-10-CM   1. MVP (mitral valve prolapse)  I34.1 EKG 12-Lead    2. Nonrheumatic mitral valve regurgitation  I34.0     3. Essential hypertension  I10     4. OSA (obstructive sleep apnea)  G47.33     5. Ascending aorta dilatation  I77.810     6. PAH (pulmonary artery hypertension) (HCC)  I27.21     7. Pure hypercholesterolemia  E78.00     8. Pre-procedure lab exam  [phone] CBC    Basic metabolic panel with GFR       Recommendation(s):  MVP (mitral valve prolapse) Nonrheumatic mitral  valve insufficiency Eco 11/2023: Concerns for primary mitral regurgitation due to mitral valve prolapse/?  Flail scallop Denies anginal chest pain or heart failure symptoms. Subjectively denies shortness of breath with effort related activities; however, his sister-in-law/friend acknowledges that he may be curtailing his physical activity to avoid symptoms. Recommended undergoing transesophageal echocardiogram to further evaluate the mitral apparatus and the severity of MR.  Risks, benefits, and alternatives to TEE discussed and patient and Jon willing to proceed forward. Check CBC and BMP as part of preprocedure workup. Precordial pain predominantly noncardiac, still considered to be acceptable candidate for transesophageal echocardiogram. Further recommendations to follow  Essential hypertension Office blood pressures are not at goal. Does not check blood pressures at home. Continue amlodipine  5 mg p.o. daily. Continue hydrochlorothiazide  25 mg p.o. daily. Continue Toprol -XL 50 mg p.o. daily. Patient is advised to start checking his blood pressures at home and if SBP in ambulatory setting is >130 mmHg consistently he is advised to call us  or PCP for further medication titration. Reemphasized importance of low-salt diet.  OSA (obstructive sleep apnea) Not compliant with either CPAP or oral dental appliance. Re emphasized its importance  Ascending aorta dilatation CT chest without contrast 05/2022: Ascending aorta 44 mm. Echocardiogram 11/2023: Aortic root 43 mm, ascending aorta 43 mm. Reemphasize importance of blood pressure management. Re emphasized the importance of seeking medical attention if he has symptoms consistent with aortic syndromes. Will reevaluate the dimensions of the aortic root and ascending aorta with the upcoming transesophageal echocardiogram.  PAH (pulmonary artery hypertension) (HCC) Has been evaluated by advanced heart failure in the past. Noted to have very  mild PAH likely secondary to WHO class II/III Currently not on PAH medications. Will likely need to be reevaluated given his underlying valvular heart disease, will discuss at the  next office visit  Pure hypercholesterolemia Continue Lipitor 40 mg p.o. daily Cardiology following peripherally, managed by primary care provider. LDL 160 mg/dL, February 2025 patient is not sure if this was a true fasting sample   Orders Placed:  Orders Placed This Encounter  Procedures   CBC   Basic metabolic panel with GFR   EKG [id number]   Discussed management of at least 2 chronic comorbid conditions. Discussed the risks, benefits, and alternatives to transesophageal echocardiogram. Collateral history provided by patient's sister-in-law/friend who accompanied him at today's visit Reviewed the last progress note from Dr. Burnard 02/03/2023. Independent review of the echocardiogram from 11/2023 Prescription drug management. Patient education and coordination of care.  Final Medication List:   No orders of the defined types were placed in this encounter.   There are no discontinued medications.   Current Outpatient Medications:    amLODipine  (NORVASC ) 5 MG tablet, Take 5 mg by mouth daily., Disp: , Rfl:    atorvastatin  (LIPITOR) 40 MG tablet, Take 1 tablet (40 mg total) by mouth daily at 6 PM., Disp: 90 tablet, Rfl: 3   fluticasone -salmeterol (ADVAIR ) 250-50 MCG/ACT AEPB, Inhale 1 puff into the lungs every 12 (twelve) hours., Disp: 60 each, Rfl: 11   hydrochlorothiazide  (HYDRODIURIL ) 25 MG tablet, TAKE 1 TABLET BY MOUTH EVERY DAY (Patient taking differently: Take 25 mg by mouth daily.), Disp: 90 tablet, Rfl: 1   melatonin 5 MG TABS, Take 5 mg by mouth at bedtime as needed (sleep)., Disp: , Rfl:    metoprolol  succinate (TOPROL -XL) 50 MG 24 hr tablet, TAKE 1 TABLET BY MOUTH DAILY WITH OR IMMEDIATELY FOLLOWING A MEAL (Patient taking differently: 50 mg daily.), Disp: 90 tablet, Rfl: 1   potassium chloride  SA  (KLOR-CON  M) 20 MEQ tablet, Take 1 tablet by mouth once daily, Disp: 90 tablet, Rfl: 3   XARELTO  10 MG TABS tablet, Take 10 mg by mouth daily. (Patient not taking: Reported on 07/09/2024), Disp: , Rfl:   Consent:   Informed Consent   Shared Decision Making/Informed Consent   The risks [esophageal damage, perforation (1:10,000 risk), bleeding, pharyngeal hematoma as well as other potential complications associated with conscious sedation including aspiration, arrhythmia, respiratory failure and death], benefits (treatment guidance and diagnostic support) and alternatives of a transesophageal echocardiogram were discussed in detail with Mr. Falero and he is willing to proceed.      Disposition:   2 weeks sooner if needed  His questions and concerns were addressed to his satisfaction. He voices understanding of the recommendations provided during this encounter.    Signed, Madonna Michele HAS, [facility]  A Division of [facility] [address], [place], [place] [address]  07/09/2024 12:41 PM

## 2024-07-09 NOTE — Progress Notes (Signed)
 Cardiology Office Note:  .   Date:  07/09/2024  ID:  Danny Norris, DOB 05-25-1961, MRN [id number] PCP:  Sun, Vyvyan, MD  Former Cardiology Providers: Dr. Burnard [facility] Providers Cardiologist:  Madonna Large, DO , [facility] (established care 07/09/24) Electrophysiologist:  None  Click to update primary MD,subspecialty MD or APP then REFRESH:1}    Chief Complaint  Patient presents with   Mitral Valve Prolapse    History of Present Illness: .   Danny Norris is a 63 y.o. 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  Formally under the care of Dr. Burnard who last saw Danny Norris back in May 2024. I am seeing him for the first time to re-establishing care.   In the past patient was evaluated by Dr. Cherrie with advanced heart failure for evaluation of pulmonary hypertension.  He did undergo right heart catheterization in March 2023 and was felt to have very mild PAH likely secondary to [facility] class II/III per Dr. Joesphine documentation.  At the last office visit with Dr. Burnard patient was recommended to have an echocardiogram as a follow-up study.  An echocardiogram was performed in February 2025 which noted concerns for severe mitral regurgitation.  Patient was advised to come to the office to discuss results and to schedule a transesophageal echocardiogram.  However, he was out of town and verbalized that he would make an appointment once he is back in town (see result note from the echocardiogram).  Patient is accompanied by his sister-in-law/friend Ms. [name] and patient provides verbal consent with having her present during today's encounter.  Clinically patient states that he is doing well and he is here for follow-up.  Review of  systems notable for anterior precordial pain.  Precordial pain: Noticed 2 weeks ago. Left-sided. Duration <1-minute. Dull sensation. Nonradiating. No improving or worsening factors. Self-limited. Unable to comment on exertional components.  But when asked if the pain is not brought on by his regular treadmill or elliptical routine.  Patient exercises 5 days a week for 30 to 40 minutes at a time and predominantly does treadmill and elliptical.  Patient denies shortness of breath.  However, according to his sister-in-law/friend he may be modifying his activities to avoid symptoms.  He denies orthopnea, PND, or lower extremity swelling.  Patient does have sleep apnea but does not use his CPAP or his oral appliance.  Patient states that his other providers in the care team have discontinued anticoagulation which she was recently on for history of DVT.  Review of Systems: .   Review of Systems  Cardiovascular:  Positive for chest pain. Negative for claudication, irregular heartbeat, leg swelling, near-syncope, orthopnea, palpitations, paroxysmal nocturnal dyspnea and syncope.  Respiratory:  Negative for shortness of breath.   Hematologic/Lymphatic: Negative for bleeding problem.    Studies Reviewed:   EKG: EKG Interpretation Date/Time:  Tuesday July 09 2024 10:44:55 EDT Ventricular Rate:  52 PR Interval:  160 QRS Duration:  108 QT Interval:  434 QTC Calculation: 403 R Axis:   12  Text Interpretation: Sinus bradycardia When compared with ECG of 14-May-2022 09:55, No significant change since last tracing Confirmed by Large Madonna [phone]) on 07/09/2024 11:14:06 AM  Echocardiogram: 11/18/2022 LVEF 60 to 65%, normal diastolic function. Right ventricular size and function normal. Moderate  LAE. Agitated bubble study negative at rest and with Valsalva. Myxomatous mitral valve, mild to moderate MR Mild aortic valve regurgitation. Aortic root 39 mm, ascending aorta 42  mm.  11/13/2023 LVEF 60 to 65%, no regional wall motion abnormalities. Right ventricular size and function normal. Mildly elevated pulmonary artery systolic pressures, 42 mmHg Severe LAE. Severe posterior leaflet prolapse with anteriorly directed jet, possible flail P2 segment Moderate AI Aortic root 43 mm, ascending aorta 43 mm, Estimated RAP 3 mmHg. Transesophageal echocardiogram recommended   RADIOLOGY: CT chest without contrast: 05/13/2022: Mild ascending aorta 4.4 cm in the greatest dimension Main pulmonary artery 3.4 cm in greatest dimension See report for additional details  Risk Assessment/Calculations:   NA   Labs:       Latest Ref Rng & Units 10/25/2022   10:02 AM 05/16/2022    6:15 AM 05/15/2022   12:44 AM  CBC  WBC 3.4 - 10.8 x10E3/uL 5.6  5.0  6.4   Hemoglobin 13.0 - 17.7 g/dL 82.3  84.3  84.2   Hematocrit 37.5 - 51.0 % 52.7  47.9  47.7   Platelets 150 - 450 x10E3/uL 184  158  149        Latest Ref Rng & Units 10/25/2022   10:02 AM 05/17/2022    3:40 AM 05/16/2022    6:15 AM  BMP  Glucose 70 - 99 mg/dL 868  883  884   BUN 8 - 27 mg/dL 13  8  11    Creatinine 0.76 - 1.27 mg/dL 9.15  9.20  9.02   BUN/Creat Ratio 10 - 24 15     Sodium 134 - 144 mmol/L 143  142  143   Potassium 3.5 - 5.2 mmol/L 3.8  4.0  3.4   Chloride 96 - 106 mmol/L 100  103  98   CO2 20 - 29 mmol/L 29  34  36   Calcium  8.6 - 10.2 mg/dL 89.3  9.7  9.8       Latest Ref Rng & Units 10/25/2022   10:02 AM 05/17/2022    3:40 AM 05/16/2022    6:15 AM  CMP  Glucose 70 - 99 mg/dL 868  883  884   BUN 8 - 27 mg/dL 13  8  11    Creatinine 0.76 - 1.27 mg/dL 9.15  9.20  9.02   Sodium 134 - 144 mmol/L 143  142  143   Potassium 3.5 - 5.2 mmol/L 3.8  4.0  3.4   Chloride 96 - 106 mmol/L 100  103  98   CO2 20 - 29 mmol/L 29  34  36   Calcium  8.6 - 10.2 mg/dL 89.3  9.7  9.8   Total Protein 6.0 - 8.5 g/dL 6.4     Total Bilirubin 0.0 - 1.2 mg/dL 1.0     Alkaline Phos 44 - 121 IU/L 71     AST 0 - 40  IU/L 16     ALT 0 - 44 IU/L 17       Lab Results  Component Value Date   CHOL 157 10/25/2022   HDL 39 (L) 10/25/2022   LDLCALC 98 10/25/2022   TRIG 107 10/25/2022   CHOLHDL 4.0 10/25/2022   No results for input(s): LIPOA in the last 8760 hours. No components found for: NTPROBNP No results for input(s): PROBNP in the last 8760 hours. No results for input(s): TSH in the last 8760 hours.  Physical Exam:    Today's Vitals   07/09/24  1041  BP: (!) 149/87  Pulse: (!) 54  SpO2: 93%  Weight: 222 lb 6.4 oz (100.9 kg)  Height: 6' (1.829 m)   Body mass index is 30.16 kg/m. Wt Readings from Last 3 Encounters:  07/09/24 222 lb 6.4 oz (100.9 kg)  02/03/23 254 lb 3.2 oz (115.3 kg)  10/25/22 259 lb (117.5 kg)    Physical Exam  Constitutional: No distress.  hemodynamically stable  Neck: No JVD present.  Cardiovascular: Normal rate, regular rhythm, S1 normal and S2 normal. Exam reveals no gallop, no S3 and no S4.  Murmur heard. High-pitched blowing holosystolic murmur is present with a grade of 3/6 at the apex. High-pitched decrescendo early diastolic murmur is present at the upper right sternal border radiating to the apex. Pulmonary/Chest: Effort normal and breath sounds normal. No stridor. He has no wheezes. He has no rales.  Musculoskeletal:        General: No edema.     Cervical back: Neck supple.  Skin: Skin is warm.     Impression & Recommendation(s):  Impression:   ICD-10-CM   1. MVP (mitral valve prolapse)  I34.1 EKG 12-Lead    2. Nonrheumatic mitral valve regurgitation  I34.0     3. Essential hypertension  I10     4. OSA (obstructive sleep apnea)  G47.33     5. Ascending aorta dilatation  I77.810     6. PAH (pulmonary artery hypertension) (HCC)  I27.21     7. Pure hypercholesterolemia  E78.00     8. Pre-procedure lab exam  [phone] CBC    Basic metabolic panel with GFR       Recommendation(s):  MVP (mitral valve prolapse) Nonrheumatic mitral  valve insufficiency Eco 11/2023: Concerns for primary mitral regurgitation due to mitral valve prolapse/?  Flail scallop Denies anginal chest pain or heart failure symptoms. Subjectively denies shortness of breath with effort related activities; however, his sister-in-law/friend acknowledges that he may be curtailing his physical activity to avoid symptoms. Recommended undergoing transesophageal echocardiogram to further evaluate the mitral apparatus and the severity of MR.  Risks, benefits, and alternatives to TEE discussed and patient and Jon willing to proceed forward. Check CBC and BMP as part of preprocedure workup. Precordial pain predominantly noncardiac, still considered to be acceptable candidate for transesophageal echocardiogram. Further recommendations to follow  Essential hypertension Office blood pressures are not at goal. Does not check blood pressures at home. Continue amlodipine  5 mg p.o. daily. Continue hydrochlorothiazide  25 mg p.o. daily. Continue Toprol -XL 50 mg p.o. daily. Patient is advised to start checking his blood pressures at home and if SBP in ambulatory setting is >130 mmHg consistently he is advised to call us  or PCP for further medication titration. Reemphasized importance of low-salt diet.  OSA (obstructive sleep apnea) Not compliant with either CPAP or oral dental appliance. Re emphasized its importance  Ascending aorta dilatation CT chest without contrast 05/2022: Ascending aorta 44 mm. Echocardiogram 11/2023: Aortic root 43 mm, ascending aorta 43 mm. Reemphasize importance of blood pressure management. Re emphasized the importance of seeking medical attention if he has symptoms consistent with aortic syndromes. Will reevaluate the dimensions of the aortic root and ascending aorta with the upcoming transesophageal echocardiogram.  PAH (pulmonary artery hypertension) (HCC) Has been evaluated by advanced heart failure in the past. Noted to have very  mild PAH likely secondary to WHO class II/III Currently not on PAH medications. Will likely need to be reevaluated given his underlying valvular heart disease, will discuss at the  next office visit  Pure hypercholesterolemia Continue Lipitor 40 mg p.o. daily Cardiology following peripherally, managed by primary care provider. LDL 160 mg/dL, February 2025 patient is not sure if this was a true fasting sample   Orders Placed:  Orders Placed This Encounter  Procedures   CBC   Basic metabolic panel with GFR   EKG 87-Ozji   Discussed management of at least 2 chronic comorbid conditions. Discussed the risks, benefits, and alternatives to transesophageal echocardiogram. Collateral history provided by patient's sister-in-law/friend who accompanied him at today's visit Reviewed the last progress note from Dr. Burnard 02/03/2023. Independent review of the echocardiogram from 11/2023 Prescription drug management. Patient education and coordination of care.  Final Medication List:   No orders of the defined types were placed in this encounter.   There are no discontinued medications.   Current Outpatient Medications:    amLODipine  (NORVASC ) 5 MG tablet, Take 5 mg by mouth daily., Disp: , Rfl:    atorvastatin  (LIPITOR) 40 MG tablet, Take 1 tablet (40 mg total) by mouth daily at 6 PM., Disp: 90 tablet, Rfl: 3   fluticasone -salmeterol (ADVAIR ) 250-50 MCG/ACT AEPB, Inhale 1 puff into the lungs every 12 (twelve) hours., Disp: 60 each, Rfl: 11   hydrochlorothiazide  (HYDRODIURIL ) 25 MG tablet, TAKE 1 TABLET BY MOUTH EVERY DAY (Patient taking differently: Take 25 mg by mouth daily.), Disp: 90 tablet, Rfl: 1   melatonin 5 MG TABS, Take 5 mg by mouth at bedtime as needed (sleep)., Disp: , Rfl:    metoprolol  succinate (TOPROL -XL) 50 MG 24 hr tablet, TAKE 1 TABLET BY MOUTH DAILY WITH OR IMMEDIATELY FOLLOWING A MEAL (Patient taking differently: 50 mg daily.), Disp: 90 tablet, Rfl: 1   potassium chloride  SA  (KLOR-CON  M) 20 MEQ tablet, Take 1 tablet by mouth once daily, Disp: 90 tablet, Rfl: 3   XARELTO  10 MG TABS tablet, Take 10 mg by mouth daily. (Patient not taking: Reported on 07/09/2024), Disp: , Rfl:   Consent:   Informed Consent   Shared Decision Making/Informed Consent   The risks [esophageal damage, perforation (1:10,000 risk), bleeding, pharyngeal hematoma as well as other potential complications associated with conscious sedation including aspiration, arrhythmia, respiratory failure and death], benefits (treatment guidance and diagnostic support) and alternatives of a transesophageal echocardiogram were discussed in detail with Danny Norris and he is willing to proceed.      Disposition:   2 weeks sooner if needed  His questions and concerns were addressed to his satisfaction. He voices understanding of the recommendations provided during this encounter.    Signed, Madonna Michele HAS, [facility]  A Division of [facility] [address], [place], [place] [address]  07/09/2024 12:41 PM

## 2024-07-09 NOTE — Patient Instructions (Addendum)
 Medication Instructions:  Your physician recommends that you continue on your current medications as directed. Please refer to the Current Medication list given to you today.  *If you need a refill on your cardiac medications before your next appointment, please call your pharmacy*  Lab Work: TODAY: CBC, BMP If you have labs (blood work) drawn today and your tests are completely normal, you will receive your results only by: MyChart Message (if you have MyChart) OR A paper copy in the mail If you have any lab test that is abnormal or we need to change your treatment, we will call you to review the results.  Testing/Procedures: Your physician has requested that you have a TEE. During a TEE, sound waves are used to create images of your heart. It provides your doctor with information about the size and shape of your heart and how well your heart's chambers and valves are working. In this test, a transducer is attached to the end of a flexible tube that's guided down your throat and into your esophagus (the tube leading from you mouth to your stomach) to get a more detailed image of your heart. You are not awake for the procedure. Please see the instruction sheet given to you today. For further information please visit [email or web address]    Follow-Up: At [facility], you and your health needs are our priority.  As part of our continuing mission to provide you with exceptional heart care, our providers are all part of one team.  This team includes your primary Cardiologist (physician) and Advanced Practice Providers or APPs (Physician Assistants and Nurse Practitioners) who all work together to provide you with the care you need, when you need it.  Your next appointment:   2 week(s) after TEE (week of August 05, 2024)  Provider:   Madonna Large, DO   We recommend signing up for the patient portal called MyChart.  Sign up information is provided on this After Visit Summary.  MyChart  is used to connect with patients for Virtual Visits (Telemedicine).  Patients are able to view lab/test results, encounter notes, upcoming appointments, etc.  Non-urgent messages can be sent to your provider as well.    To learn more about what you can do with MyChart, go to [email or web address].   Other Instructions    Dear Danny Norris   You are scheduled for a TEE (Transesophageal Echocardiogram) on Wednesday, October 22nd  with Dr. Francyne.  Please arrive at the [facility] (Main Entrance A) at [facility]: [address] [place], [place] [address] at 7:00 AM (This time is 1 hour(s) before your procedure to ensure your preparation).   Free valet parking service is available. You will check in at ADMITTING.   *Please Note: You will receive a call the day before your procedure to confirm the appointment time. That time may have changed from the original time based on the schedule for that day.*   DIET:  Nothing to eat or drink after midnight except a sip of water with medications (see medication instructions below)  MEDICATION INSTRUCTIONS: !!IF ANY NEW MEDICATIONS ARE STARTED AFTER TODAY, PLEASE NOTIFY YOUR PROVIDER AS SOON AS POSSIBLE!!  FYI: Medications such as Semaglutide (Ozempic, [place]), Tirzepatide (Mounjaro, Zepbound), Dulaglutide (Trulicity), etc (GLP1 agonists) AND Canagliflozin (Invokana), Dapagliflozin (Farxiga), Empagliflozin (Jardiance), Ertugliflozin (Steglatro), Bexagliflozin [employer]) or any combination with one of these drugs such as Invokamet (Canagliflozin/Metformin), Synjardy (Empagliflozin/Metformin), etc (SGLT2 inhibitors) must be held around the time of a procedure. This is  not a comprehensive list of all of these drugs. Please review all of your medications and talk to your provider if you take any one of these. If you are not sure, ask your provider.   LABS: TODAY (CBC, BMP)  FYI:  For your safety, and to allow us  to monitor your vital signs  accurately during the surgery/procedure we request: If you have artificial nails, gel coating, SNS etc, please have those removed prior to your surgery/procedure. Not having the nail coverings /polish removed may result in cancellation or delay of your surgery/procedure.  Your support person will be asked to wait in the waiting room during your procedure.  It is OK to have someone drop you off and come back when you are ready to be discharged.  You cannot drive after the procedure and will need someone to drive you home.  Bring your insurance cards.  *Special Note: Every effort is made to have your procedure done on time. Occasionally there are emergencies that occur at the hospital that may cause delays. Please be patient if a delay does occur.

## 2024-07-10 LAB — BASIC METABOLIC PANEL WITH GFR
BUN/Creatinine Ratio: 14 (ref 10–24)
BUN: 13 mg/dL (ref 8–27)
CO2: 29 mmol/L (ref 20–29)
Calcium: 11.2 mg/dL — AB (ref 8.6–10.2)
Chloride: 102 mmol/L (ref 96–106)
Creatinine, Ser: 0.91 mg/dL (ref 0.76–1.27)
Glucose: 92 mg/dL (ref 70–99)
Potassium: 4.2 mmol/L (ref 3.5–5.2)
Sodium: 146 mmol/L — AB (ref 134–144)
eGFR: 95 mL/min/1.73 (ref >59)

## 2024-07-10 LAB — CBC
Hematocrit: 50.2 % (ref 37.5–51.0)
Hemoglobin: 16.7 g/dL (ref 13.0–17.7)
MCH: 32.9 pg (ref 26.6–33.0)
MCHC: 33.3 g/dL (ref 31.5–35.7)
MCV: 99 fL — ABNORMAL HIGH (ref 79–97)
Platelets: 162 x10E3/uL (ref 150–450)
RBC: 5.07 x10E6/uL (ref 4.14–5.80)
RDW: 12.7 % (ref 11.6–15.4)
WBC: 6 x10E3/uL (ref 3.4–10.8)

## 2024-07-11 ENCOUNTER — Ambulatory Visit: Payer: Self-pay | Admitting: Cardiology

## 2024-07-23 NOTE — Progress Notes (Signed)
 Spoke to patient and instructed them to come at 0700  and to be NPO after 0000.     Confirmed that patient will have a ride home and someone to stay with them for 24 hours after the procedure.   Medications reviewed.

## 2024-07-24 ENCOUNTER — Encounter (HOSPITAL_COMMUNITY): Admission: RE | Disposition: A | Payer: Self-pay | Source: Home / Self Care | Attending: Cardiovascular Disease

## 2024-07-24 ENCOUNTER — Encounter (HOSPITAL_COMMUNITY): Payer: Self-pay | Admitting: Cardiovascular Disease

## 2024-07-24 ENCOUNTER — Ambulatory Visit (HOSPITAL_COMMUNITY): Admitting: Certified Registered"

## 2024-07-24 ENCOUNTER — Ambulatory Visit (HOSPITAL_COMMUNITY)
Admission: RE | Admit: 2024-07-24 | Discharge: 2024-07-24 | Disposition: A | Discharge status: Home / Self Care | Attending: Cardiovascular Disease | Admitting: Cardiovascular Disease

## 2024-07-24 ENCOUNTER — Ambulatory Visit (HOSPITAL_COMMUNITY)

## 2024-07-24 ENCOUNTER — Other Ambulatory Visit: Payer: Self-pay

## 2024-07-24 DIAGNOSIS — I340 Nonrheumatic mitral (valve) insufficiency: Secondary | ICD-10-CM | POA: Diagnosis present

## 2024-07-24 DIAGNOSIS — I10 Essential (primary) hypertension: Secondary | ICD-10-CM | POA: Diagnosis not present

## 2024-07-24 DIAGNOSIS — G4733 Obstructive sleep apnea (adult) (pediatric): Secondary | ICD-10-CM | POA: Diagnosis not present

## 2024-07-24 DIAGNOSIS — Z79899 Other long term (current) drug therapy: Secondary | ICD-10-CM | POA: Diagnosis not present

## 2024-07-24 DIAGNOSIS — E7800 Pure hypercholesterolemia, unspecified: Secondary | ICD-10-CM | POA: Diagnosis not present

## 2024-07-24 DIAGNOSIS — I77810 Thoracic aortic ectasia: Secondary | ICD-10-CM | POA: Diagnosis not present

## 2024-07-24 DIAGNOSIS — I341 Nonrheumatic mitral (valve) prolapse: Secondary | ICD-10-CM | POA: Diagnosis not present

## 2024-07-24 DIAGNOSIS — I080 Rheumatic disorders of both mitral and aortic valves: Secondary | ICD-10-CM | POA: Insufficient documentation

## 2024-07-24 DIAGNOSIS — Z86718 Personal history of other venous thrombosis and embolism: Secondary | ICD-10-CM | POA: Diagnosis not present

## 2024-07-24 DIAGNOSIS — I2721 Secondary pulmonary arterial hypertension: Secondary | ICD-10-CM | POA: Insufficient documentation

## 2024-07-24 HISTORY — PX: TRANSESOPHAGEAL ECHOCARDIOGRAM (CATH LAB): EP1270

## 2024-07-24 LAB — ECHO TEE
AR max vel: 3.38 cm2
AV Area VTI: 4.23 cm2
AV Area mean vel: 4.16 cm2
AV Mean grad: 5.5 mmHg
AV Peak grad: 10.7 mmHg
AV Vena cont: 0.35 cm
Ao pk vel: 1.64 m/s
MV M vel: 3.98 m/s
MV Peak grad: 63.4 mmHg
P 1/2 time: 521 ms
Radius: 1.7 cm

## 2024-07-24 SURGERY — TRANSESOPHAGEAL ECHOCARDIOGRAM (TEE) (CATHLAB)
Anesthesia: Monitor Anesthesia Care

## 2024-07-24 MED ORDER — LIDOCAINE 2% (20 MG/ML) 5 ML SYRINGE
INTRAMUSCULAR | Status: DC | PRN
  Administered 2024-07-24: 60 mg via INTRAVENOUS

## 2024-07-24 MED ORDER — PROPOFOL 10 MG/ML IV BOLUS
INTRAVENOUS | Status: DC | PRN
Start: 2024-07-24 — End: 2024-07-24
  Administered 2024-07-24: 125 ug/kg/min via INTRAVENOUS

## 2024-07-24 MED ORDER — SODIUM CHLORIDE 0.9 % IV SOLN: INTRAVENOUS | Status: DC

## 2024-07-24 NOTE — Interval H&P Note (Signed)
 History and Physical Interval Note:  07/24/2024 7:36 AM  Danny Norris  has presented today for surgery, with the diagnosis of mitral regurgitation.  The various methods of treatment have been discussed with the patient and family. After consideration of risks, benefits and other options for treatment, the patient has consented to  Procedure(s): TRANSESOPHAGEAL ECHOCARDIOGRAM (N/A) as a surgical intervention.  The patient's history has been reviewed, patient examined, no change in status, stable for surgery.  I have reviewed the patient's chart and labs.  Questions were answered to the patient's satisfaction.     Cheney Ewart

## 2024-07-24 NOTE — Transfer of Care (Signed)
 Immediate Anesthesia Transfer of Care Note  Patient: Danny Norris  Procedure(s) Performed: TRANSESOPHAGEAL ECHOCARDIOGRAM  Patient Location: PACU  Anesthesia Type:MAC  Level of Consciousness: drowsy  Airway & Oxygen  Therapy: Patient Spontanous Breathing and Patient connected to face mask oxygen   Post-op Assessment: Report given to RN and Post -op Vital signs reviewed and stable  Post vital signs: Reviewed and stable  Last Vitals:  Vitals Value Taken Time  BP    Temp    Pulse    Resp    SpO2      Last Pain:  Vitals:   07/24/24 0703  TempSrc: Temporal  PainSc: 0-No pain         Complications: No notable events documented.

## 2024-07-24 NOTE — Op Note (Signed)
 INDICATIONS: Mitral insufficiency aortic root disease  PROCEDURE:   Informed consent was obtained prior to the procedure. The risks, benefits and alternatives for the procedure were discussed and the patient comprehended these risks.  Risks include, but are not limited to, cough, sore throat, vomiting, nausea, somnolence, esophageal and stomach trauma or perforation, bleeding, low blood pressure, aspiration, pneumonia, infection, trauma to the teeth and death.    After a procedural time-out, the oropharynx was anesthetized with 20% benzocaine spray.   During this procedure the patient was administered IV propofol  by Anesthesiology, Dr. Patrisha  The transesophageal probe was inserted in the esophagus and stomach without difficulty and multiple views were obtained.  The patient was kept under observation until the patient left the procedure room.  The patient left the procedure room in stable condition.   Agitated microbubble saline contrast was not administered.  COMPLICATIONS:    There were no immediate complications.  FINDINGS:  Normal LV function. Myxomatous mitral valve with severe prolapse of the middle (P2) scallop of the posterior leaflet. There is severe mitral insufficiency, but limited to the last third of systole, so that the overall regurgitant volume is less than severe. 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 Dilated aortic root with a diameter of 5.0 cm at the sinuses and dilated ascending aorta. Trileaflet aortic valve with mild-moderate aortic insufficiency  RECOMMENDATIONS:    Eventually may require both mitral valve repair and aortic root replacement. Consider evaluation for connective tissue disorder such as Marfan's or Ehlers-Danlos syndrome.  Time Spent Directly with the Patient:  30 minutes   Danny Norris 07/24/2024, 8:44 AM

## 2024-07-24 NOTE — Anesthesia Preprocedure Evaluation (Addendum)
 Anesthesia Evaluation  Patient identified by MRN, date of birth, ID band Patient awake    Reviewed: Allergy & Precautions, NPO status , Patient's Chart, lab work & pertinent test results  Airway Mallampati: II  TM Distance: >3 FB Neck ROM: Full    Dental no notable dental hx.    Pulmonary sleep apnea and Continuous Positive Airway Pressure Ventilation    Pulmonary exam normal        Cardiovascular hypertension, Pt. on medications and Pt. on home beta blockers + DVT  Normal cardiovascular exam+ Valvular Problems/Murmurs      Neuro/Psych    GI/Hepatic   Endo/Other    Renal/GU      Musculoskeletal   Abdominal   Peds  Hematology   Anesthesia Other Findings Nonrheumatic mitral valve regurgitation   Reproductive/Obstetrics                              Anesthesia Physical Anesthesia Plan  ASA: 3  Anesthesia Plan: MAC   Post-op Pain Management:    Induction:   PONV Risk Score and Plan: 1 and Propofol  infusion and Treatment may vary due to age or medical condition  Airway Management Planned: Nasal Cannula  Additional Equipment:   Intra-op Plan:   Post-operative Plan:   Informed Consent: I have reviewed the patients History and Physical, chart, labs and discussed the procedure including the risks, benefits and alternatives for the proposed anesthesia with the patient or authorized representative who has indicated his/her understanding and acceptance.     Dental advisory given  Plan Discussed with: CRNA  Anesthesia Plan Comments:         Anesthesia Quick Evaluation

## 2024-07-25 NOTE — Anesthesia Postprocedure Evaluation (Signed)
 Anesthesia Post Note  Patient: Danny Norris  Procedure(s) Performed: TRANSESOPHAGEAL ECHOCARDIOGRAM     Patient location during evaluation: Cath Lab Anesthesia Type: MAC Level of consciousness: awake Pain management: pain level controlled Vital Signs Assessment: post-procedure vital signs reviewed and stable Respiratory status: spontaneous breathing, nonlabored ventilation and respiratory function stable Cardiovascular status: blood pressure returned to baseline and stable Postop Assessment: no apparent nausea or vomiting Anesthetic complications: no   No notable events documented.  Last Vitals:  Vitals:   07/24/24 0910 07/24/24 0915  BP: 114/73 121/82  Pulse: (!) 51 (!) 51  Resp: 17 (!) 9  Temp:    SpO2: 91% 92%    Last Pain:  Vitals:   07/24/24 0915  TempSrc:   PainSc: 0-No pain                 Abbie Jablon P Delrose Rohwer

## 2024-08-06 ENCOUNTER — Ambulatory Visit: Attending: Cardiology | Admitting: Cardiology

## 2024-08-06 ENCOUNTER — Encounter: Payer: Self-pay | Admitting: Cardiology

## 2024-08-06 ENCOUNTER — Other Ambulatory Visit (HOSPITAL_COMMUNITY): Payer: Self-pay

## 2024-08-06 VITALS — BP 118/68 | HR 48 | Resp 16 | Ht 73.0 in | Wt 217.4 lb

## 2024-08-06 DIAGNOSIS — Z01812 Encounter for preprocedural laboratory examination: Secondary | ICD-10-CM

## 2024-08-06 DIAGNOSIS — I10 Essential (primary) hypertension: Secondary | ICD-10-CM

## 2024-08-06 DIAGNOSIS — I341 Nonrheumatic mitral (valve) prolapse: Secondary | ICD-10-CM | POA: Diagnosis not present

## 2024-08-06 DIAGNOSIS — I349 Nonrheumatic mitral valve disorder, unspecified: Secondary | ICD-10-CM | POA: Diagnosis not present

## 2024-08-06 DIAGNOSIS — Z8249 Family history of ischemic heart disease and other diseases of the circulatory system: Secondary | ICD-10-CM

## 2024-08-06 DIAGNOSIS — I77810 Thoracic aortic ectasia: Secondary | ICD-10-CM

## 2024-08-06 DIAGNOSIS — I209 Angina pectoris, unspecified: Secondary | ICD-10-CM

## 2024-08-06 DIAGNOSIS — G4733 Obstructive sleep apnea (adult) (pediatric): Secondary | ICD-10-CM

## 2024-08-06 DIAGNOSIS — E7800 Pure hypercholesterolemia, unspecified: Secondary | ICD-10-CM

## 2024-08-06 DIAGNOSIS — I059 Rheumatic mitral valve disease, unspecified: Secondary | ICD-10-CM

## 2024-08-06 MED ORDER — PREDNISONE 50 MG PO TABS
ORAL_TABLET | ORAL | 0 refills | Status: DC
  Filled 2024-08-06: qty 3, 2d supply, fill #0

## 2024-08-06 MED ORDER — ASPIRIN 81 MG PO TBEC
81.0000 mg | DELAYED_RELEASE_TABLET | Freq: Every day | ORAL | 12 refills | Status: AC
  Filled 2024-08-06: qty 30, 30d supply, fill #0

## 2024-08-06 MED ORDER — LOSARTAN POTASSIUM 25 MG PO TABS
25.0000 mg | ORAL_TABLET | Freq: Every day | ORAL | 6 refills | Status: AC
  Filled 2024-08-06: qty 30, 30d supply, fill #0
  Filled 2024-09-10: qty 30, 30d supply, fill #1
  Filled 2024-10-14: qty 30, 30d supply, fill #2

## 2024-08-06 NOTE — Progress Notes (Signed)
 Cardiology Office Note:  .   Date:  08/06/2024  ID:  Danny Norris, DOB 1961-01-04, MRN [id number] PCP:  Sun, Vyvyan, MD  Former Cardiology Providers: Dr. Burnard [facility] Providers Cardiologist:  Madonna Large, DO , [facility] (established care 07/09/24) Electrophysiologist:  None  Click to update primary MD,subspecialty MD or APP then REFRESH:1}    Chief Complaint  Patient presents with   Post TEE    Follow-up   Chest Pain    History of Present Illness: .   Danny Norris is a 63 y.o. 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  Formally under the care of Dr. Burnard who last saw Danny Norris back in May 2024 and I started seeing him in October 2025.   In the past patient was evaluated by Dr. Cherrie with advanced heart failure for evaluation of pulmonary hypertension.  He did undergo right heart catheterization in March 2023 and was felt to have very mild PAH likely secondary to [facility] class II/III per Dr. Joesphine documentation.  During his prior visit with Dr. Burnard patient was recommended to have an echocardiogram as a follow-up study.  An echocardiogram was performed in February 2025 which noted concerns for severe mitral regurgitation.  Patient was advised to come to the office to discuss results and to schedule a transesophageal echocardiogram.  However, he was out of town and verbalized that he would make an appointment once he is back in town (see result note from the echocardiogram).  07/09/2024 Recommended undergoing transesophageal echocardiogram to evaluate the mitral apparatus as well as the severity of mitral regurgitation.  TEE results reviewed with the patient at today's office visit.  In summary, severe  mitral regurgitation based on ERO a and regurgitant volume.  Prior transthoracic echocardiogram has noted LV dimensions at 6.28 cm (LVIDd).  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  During last office visit patient was endorsing precordial pain which was felt to be noncardiac and recommended monitoring for progression of symptoms.  08/06/2024: Patient is accompanied by his sister-in-law/friend Ms. Angela Pickard and patient provides verbal consent with having her present during today's encounter.  He experiences chest pain and shortness of breath, particularly during exercise, which leads him to slow down his activity.  He also notices shortness of breath with effort related activities predominantly with overexertion.  He modifies his activities to alleviate symptoms.  He exercises five days a week, engaging in walking, using an elliptical, or cycling for about 30 minutes.  Patient is overall euvolemic and not in overt heart failure.  No active chest pain.  Since the transesophageal echocardiogram noted aneurysmal aortic root at 51 mm, and ascending aorta dilatation at 43 mm we discussed the importance of blood pressure management.  In addition, patient states that his brother suffered an aortic dissection.  No other family members with aortic syndromes.  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   Patient does have sleep apnea but does not use his CPAP or his oral appliance.  Review of Systems: .   Review of Systems  Cardiovascular:  Positive for chest pain and dyspnea on exertion. Negative for claudication, irregular heartbeat, leg swelling, near-syncope,  orthopnea, palpitations, paroxysmal nocturnal dyspnea and syncope.  Respiratory:  Negative for shortness of breath.   Hematologic/Lymphatic: Negative for bleeding problem.    Studies  Reviewed:   EKG: EKG Interpretation Date/Time:  Tuesday August 06 2024 10:52:21 EST Ventricular Rate:  49 PR Interval:  162 QRS Duration:  110 QT Interval:  438 QTC Calculation: 395 R Axis:   5  Text Interpretation: Sinus bradycardia T wave abnormality, consider inferior ischemia When compared with ECG of 09-Jul-2024 10:44, Inferior  TWI are more pronounced Confirmed by Michele Richardson [phone]) on 08/06/2024 11:43:31 AM  Echocardiogram: 11/18/2022 LVEF 60 to 65%, normal diastolic function. Right ventricular size and function normal. Moderate LAE. Agitated bubble study negative at rest and with Valsalva. Myxomatous mitral valve, mild to moderate MR Mild aortic valve regurgitation. Aortic root 39 mm, ascending aorta 42 mm.  11/13/2023 LVEF 60 to 65%, no regional wall motion abnormalities. Right ventricular size and function normal. Mildly elevated pulmonary artery systolic pressures, 42 mmHg Severe LAE. Severe posterior leaflet prolapse with anteriorly directed jet, possible flail P2 segment Moderate AI Aortic root 43 mm, ascending aorta 43 mm, Estimated RAP 3 mmHg. Transesophageal echocardiogram recommended  TEE 07/24/2024 LVEF 60 to 65%. Right ventricular size and function normal. Severely dilated left atrium. 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 Severe prolapse and severe mitral  insufficiency only occur in the last 45% of systole. The maximum instantaneous effective regurgitant orifice area is very large at 1.5 cm sq., but the regurgitant volume is 82 ml and the regurgitant fraction is 46%, therefore more consistent with moderate-to-severe mitral insufficiency. There is systolic dominant forward flow in the left pulmonary vein and blunted, but  antegrade systolic flow in the right pulmonary veins. No chordal rupture is identified. The mitral valve is myxomatous.  Moderate to severe mitral valve regurgitation. No evidence of mitral stenosis. There is severe late systolic prolapse of the middle segment of the anterior leaflet of the mitral valve. The mean mitral valve gradient is 2.3 mmHg with average heart rate of 75 bpm.  Tricuspid valve is myxomatous.  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 There is moderate dilatation of the ascending aorta, measuring 43 mm. The aortic arch and descending aorta appear to be normal in caliber.   Conclusion(s)/Recommendation(s): Consider evaluation for connective tissue disorder (e.g. Marfan or Ehler's-Danlos syndrome). Note family history of aortic dissection(brother). Serial imaging of the aorta is indicated. Future measurement of the aortic root should be done with a cardiac gated CT angiography protocol, since the aortic root was not identified as being aneurysmal on the recent non-gated CT without contrast.   RADIOLOGY: CT chest without contrast: 05/13/2022: Mild ascending aorta 4.4 cm in the greatest dimension Main pulmonary artery 3.4 cm in greatest dimension See report for additional details  Risk Assessment/Calculations:   NA   Labs:       Latest Ref Rng & Units 07/09/2024   12:40 PM 10/25/2022   10:02 AM 05/16/2022    6:15 AM  CBC  WBC 3.4 - 10.8 x10E3/uL 6.0  5.6  5.0   Hemoglobin 13.0 - 17.7 g/dL 83.2  82.3  84.3   Hematocrit 37.5 - 51.0 % 50.2  52.7  47.9   Platelets 150 - 450 x10E3/uL 162  184  158        Latest Ref Rng & Units 07/09/2024   12:40  PM 10/25/2022   10:02 AM 05/17/2022    3:40 AM  BMP  Glucose 70 - 99 mg/dL 92  868  883   BUN 8 - 27 mg/dL 13  13  8    Creatinine 0.76 - 1.27 mg/dL 9.08  9.15  9.20   BUN/Creat Ratio 10 - 24 14  15     Sodium 134 - 144 mmol/L 146  143  142   Potassium 3.5 - 5.2 mmol/L 4.2  3.8  4.0   Chloride 96 - 106 mmol/L 102  100  103   CO2 20 - 29 mmol/L 29  29  34   Calcium  8.6 - 10.2 mg/dL 88.7  89.3  9.7       Latest  Ref Rng & Units 07/09/2024   12:40 PM 10/25/2022   10:02 AM 05/17/2022    3:40 AM  CMP  Glucose 70 - 99 mg/dL 92  868  883   BUN 8 - 27 mg/dL 13  13  8    Creatinine 0.76 - 1.27 mg/dL 9.08  9.15  9.20   Sodium 134 - 144 mmol/L 146  143  142   Potassium 3.5 - 5.2 mmol/L 4.2  3.8  4.0   Chloride 96 - 106 mmol/L 102  100  103   CO2 20 - 29 mmol/L 29  29  34   Calcium  8.6 - 10.2 mg/dL 88.7  89.3  9.7   Total Protein 6.0 - 8.5 g/dL  6.4    Total Bilirubin 0.0 - 1.2 mg/dL  1.0    Alkaline Phos 44 - 121 IU/L  71    AST 0 - 40 IU/L  16    ALT 0 - 44 IU/L  17      Lab Results  Component Value Date   CHOL 157 10/25/2022   HDL 39 (L) 10/25/2022   LDLCALC 98 10/25/2022   TRIG 107 10/25/2022   CHOLHDL 4.0 10/25/2022   No results for input(s): LIPOA in the last 8760 hours. No components found for: NTPROBNP No results for input(s): PROBNP in the last 8760 hours. No results for input(s): TSH in the last 8760 hours.  Physical Exam:    Today's Vitals   08/06/24 1055  BP: 118/68  Pulse: (!) 48  Resp: 16  SpO2: 93%  Weight: 217 lb 6.4 oz (98.6 kg)  Height: 6' 1 (1.854 m)   Body mass index is 28.68 kg/m. Wt Readings from Last 3 Encounters:  08/06/24 217 lb 6.4 oz (98.6 kg)  07/24/24 210 lb (95.3 kg)  07/09/24 222 lb 6.4 oz (100.9 kg)    Physical Exam  Constitutional: No distress.  hemodynamically stable  Neck: No JVD present.  Cardiovascular: Normal rate, regular rhythm, S1 normal and S2 normal. Exam reveals no gallop, no S3 and no S4.  Murmur heard. High-pitched blowing holosystolic murmur is present with a grade of 3/6 at the apex. High-pitched decrescendo early diastolic murmur is present at the upper right sternal border radiating to the apex. Pulmonary/Chest: Effort normal and breath sounds normal. No stridor. He has no wheezes. He has no rales.  Musculoskeletal:        General: No edema.     Cervical back: Neck supple.  Skin: Skin is warm.     Impression &  Recommendation(s):  Impression:   ICD-10-CM   1. Mitral valve disease  I05.9     2. MVP (mitral valve prolapse)  I34.1 EKG 12-Lead    Basic Metabolic Panel (BMET)  CBC    CBC    Basic Metabolic Panel (BMET)    CANCELED: Referral to HRT/VAS Care Navigation    3. Pre-procedure lab exam  [phone] Basic Metabolic Panel (BMET)    CBC    CBC    Basic Metabolic Panel (BMET)    4. Angina pectoris  I20.9 Basic Metabolic Panel (BMET)    CBC    CBC    Basic Metabolic Panel (BMET)    aspirin  EC 81 MG tablet    5. Aortic root dilatation  I77.810 CT ANGIO CHEST AORTA W/CM & OR WO/CM    Ambulatory referral to Genetics    losartan (COZAAR) 25 MG tablet    6. Family history of dissection of thoracic aorta  Z82.49 Ambulatory referral to Genetics    7. Essential hypertension  I10 losartan (COZAAR) 25 MG tablet    8. OSA (obstructive sleep apnea)  G47.33     9. Pure hypercholesterolemia  E78.00        Recommendation(s):  Mitral valve disease MVP (mitral valve prolapse) Recent transesophageal echocardiogram notes bileaflet prolapse suggestive of Barlow disease.  Quantitative assessment suggestive of severe mitral regurgitation based on EROA and regurgitant volume.  Moreover, prior transthoracic echo noted LVIDD at 6.28 cm.  Severe left atrial enlargement with blunted flow in the pulmonary vein. Patient has been experiencing shortness of breath with effort related activities and possibly altering his activity level to alleviate symptoms. Given the severity of mitral regurgitation, LV size, left atrial dilatation, and blunted flow in the pulmonary veins, and symptoms are recommended further workup for mitral valve intervention. Patient also endorses chest pain with effort related activities predominantly noticed with exercise.  EKG today notes sinus rhythm with more pronounced TWI in inferior leads. Patient agreeable we will proceed forward left and right heart catheterization.  Angina  pectoris Symptoms suggestive of cardiac discomfort. No active pain. EKG notes TWI in inferior leads suggestive of ischemia. Avoid overexertion Start aspirin  81 mg p.o. daily Continue atorvastatin  40 mg p.o. nightly Will proceed forward with left heart catheterization with possible intervention.  Risks, benefits, alternatives discussed Educated him on seeking medical attention sooner by going to the closest ER via EMS if the symptoms increase in intensity, frequency, duration, or has typical chest pain as discussed in the office.  Patient verbalized understanding.  Aortic root dilatation /ascending aorta dilatation Family history of dissection of thoracic aorta Known history of dilated ascending aorta most recent transthoracic echo in February 2025 noted the aortic root to be 43 mm and ascending aorta to be 43 mm as well. Most recent transesophageal echocardiogram reported aortic root to be 51 mm (suggestive of aneurysmal dilatation) and  the ascending aorta to be 43 mm. Patient also has moderate aortic regurgitation Will do a CTA aorta protocol to reevaluate the dimensions of the ascending aorta and aortic root. Patient reports family history of aortic dissection affecting his brother. Given the myxomatous mitral and tricuspid valve and concerns for aortic root dilatation will refer to ambulatory geneticist for evaluation of connective tissue disorders such as Marfan's/Ehlers-Danlos/etc.  Essential hypertension Office blood pressures are well-controlled. Given the aortic root and ascending aorta findings recommend discontinuation of amlodipine  5 mg p.o. daily and transition to losartan 25 mg p.o. every afternoon.  Recommended he is takes his hydrochlorothiazide  in the morning. Monitor blood pressures at home.  OSA (obstructive sleep apnea) In the past patient was noncompliant with either CPAP or oral dental appliance Prior to establishing care with myself patient  has been evaluated by  advanced heart failure for mild pulmonary hypertension secondary to [facility] class II/III as well.  Pure hypercholesterolemia Continue Lipitor 40 mg p.o. nightly  For both CTA aorta protocol and heart catheterization patient will need to be treated for contrast allergy.   Orders Placed:  Orders Placed This Encounter  Procedures   CT ANGIO CHEST AORTA W/CM & OR WO/CM    Standing Status:   Future    Expiration Date:   08/06/2025    If indicated for the ordered procedure, I authorize the administration of contrast media per Radiology protocol:   Yes    Initiate TAVR/CTA/Valve Evaluation Adult Protocol:   Yes    Does the patient have a contrast media/X-ray dye allergy?:   Yes    [facility]   Basic Metabolic Panel (BMET)    Standing Status:   Future    Number of Occurrences:   1    Expected Date:   08/06/2024    Expiration Date:   08/06/2025    Remote health to draw?:   No   CBC    Standing Status:   Future    Number of Occurrences:   1    Expected Date:   08/06/2024    Expiration Date:   08/06/2025    Remote health to draw?:   No   Ambulatory referral to Genetics    Referral Priority:   Routine    Referral Type:   Consultation    Referral Reason:   Specialty Services Required    Referred to Provider:   Fairy Danford HERO, PhD    Number of Visits Requested:   1   EKG 12-Lead   Final Medication List:    Meds ordered this encounter  Medications   predniSONE (DELTASONE) 50 MG tablet    Sig: Take 1 tablet 13 hours, 7 hours & 1 hour prior to catheterization    Dispense:  3 tablet    Refill:  0   losartan (COZAAR) 25 MG tablet    Sig: Take 1 tablet (25 mg total) by mouth daily.    Dispense:  30 tablet    Refill:  6    Stopping Amlodipine    aspirin  EC 81 MG tablet    Sig: Take 1 tablet (81 mg total) by mouth daily. Swallow whole.    Dispense:  30 tablet    Refill:  12    There are no discontinued medications.   Current Outpatient  Medications:    aspirin  EC 81 MG tablet, Take 1 tablet (81 mg total) by mouth daily. Swallow whole., Disp: 30 tablet, Rfl: 12   losartan (COZAAR) 25 MG tablet, Take 1 tablet (25 mg total) by mouth daily., Disp: 30 tablet, Rfl: 6   predniSONE (DELTASONE) 50 MG tablet, Take 1 tablet 13 hours, 7 hours & 1 hour prior to catheterization, Disp: 3 tablet, Rfl: 0   amLODipine  (NORVASC ) 5 MG tablet, Take 5 mg by mouth daily., Disp: , Rfl:    atorvastatin  (LIPITOR) 40 MG tablet, Take 1 tablet (40 mg total) by mouth daily at 6 PM., Disp: 90 tablet, Rfl: 3   Coenzyme Q10 (CO Q 10) 100 MG CAPS, Take 100 mg by mouth at bedtime., Disp: , Rfl:    hydrochlorothiazide  (HYDRODIURIL ) 25 MG tablet, TAKE 1 TABLET BY MOUTH EVERY DAY, Disp: 90 tablet, Rfl: 1   metoprolol  succinate (TOPROL -XL) 50 MG 24 hr tablet, TAKE 1 TABLET BY MOUTH  DAILY WITH OR IMMEDIATELY FOLLOWING A MEAL, Disp: 90 tablet, Rfl: 1  Consent:    Informed Consent   Shared Decision Making/Informed Consent The risks [stroke (1 in 1000), death (1 in 1000), kidney failure [usually temporary] (1 in 500), bleeding (1 in 200), allergic reaction [possibly serious] (1 in 200)], benefits (diagnostic support and management of coronary artery disease) and alternatives of a left and right heart cardiac catheterization were discussed in detail with Mr. Hyslop and his sister-in-law and they are willing to proceed.     Disposition:   4 to 5 weeks, status post heart catheterization and CT  His questions and concerns were addressed to his satisfaction. He voices understanding of the recommendations provided during this encounter.   I personally spent a total of 47 minutes in the care of the patient today including preparing to see the patient, getting/reviewing separately obtained history, performing a medically appropriate exam/evaluation, counseling and educating, documenting clinical information in the EHR, independently interpreting results, coordinating care, and  Discussed that he should go to the closest ER via EMS if he has worsening chest pain in the interim..   Signed, Madonna Michele HAS, [facility]  A Division of [facility] [address], [place], [place] [address]  08/06/2024 6:55 PM

## 2024-08-06 NOTE — H&P (View-Only) (Signed)
 Cardiology Office Note:  .   Date:  08/06/2024  ID:  Danny Norris, DOB 1961-01-04, MRN [id number] PCP:  Sun, Vyvyan, MD  Former Cardiology Providers: Dr. Burnard [facility] Providers Cardiologist:  Madonna Large, DO , [facility] (established care 07/09/24) Electrophysiologist:  None  Click to update primary MD,subspecialty MD or APP then REFRESH:1}    Chief Complaint  Patient presents with   Post TEE    Follow-up   Chest Pain    History of Present Illness: .   Danny Norris is a 63 y.o. 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  Formally under the care of Dr. Burnard who last saw Danny Norris back in May 2024 and I started seeing him in October 2025.   In the past patient was evaluated by Dr. Cherrie with advanced heart failure for evaluation of pulmonary hypertension.  He did undergo right heart catheterization in March 2023 and was felt to have very mild PAH likely secondary to [facility] class II/III per Dr. Joesphine documentation.  During his prior visit with Dr. Burnard patient was recommended to have an echocardiogram as a follow-up study.  An echocardiogram was performed in February 2025 which noted concerns for severe mitral regurgitation.  Patient was advised to come to the office to discuss results and to schedule a transesophageal echocardiogram.  However, he was out of town and verbalized that he would make an appointment once he is back in town (see result note from the echocardiogram).  07/09/2024 Recommended undergoing transesophageal echocardiogram to evaluate the mitral apparatus as well as the severity of mitral regurgitation.  TEE results reviewed with the patient at today's office visit.  In summary, severe  mitral regurgitation based on ERO a and regurgitant volume.  Prior transthoracic echocardiogram has noted LV dimensions at 6.28 cm (LVIDd).  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  During last office visit patient was endorsing precordial pain which was felt to be noncardiac and recommended monitoring for progression of symptoms.  08/06/2024: Patient is accompanied by his sister-in-law/friend Ms. Angela Pickard and patient provides verbal consent with having her present during today's encounter.  He experiences chest pain and shortness of breath, particularly during exercise, which leads him to slow down his activity.  He also notices shortness of breath with effort related activities predominantly with overexertion.  He modifies his activities to alleviate symptoms.  He exercises five days a week, engaging in walking, using an elliptical, or cycling for about 30 minutes.  Patient is overall euvolemic and not in overt heart failure.  No active chest pain.  Since the transesophageal echocardiogram noted aneurysmal aortic root at 51 mm, and ascending aorta dilatation at 43 mm we discussed the importance of blood pressure management.  In addition, patient states that his brother suffered an aortic dissection.  No other family members with aortic syndromes.  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   Patient does have sleep apnea but does not use his CPAP or his oral appliance.  Review of Systems: .   Review of Systems  Cardiovascular:  Positive for chest pain and dyspnea on exertion. Negative for claudication, irregular heartbeat, leg swelling, near-syncope,  orthopnea, palpitations, paroxysmal nocturnal dyspnea and syncope.  Respiratory:  Negative for shortness of breath.   Hematologic/Lymphatic: Negative for bleeding problem.    Studies  Reviewed:   EKG: EKG Interpretation Date/Time:  Tuesday August 06 2024 10:52:21 EST Ventricular Rate:  49 PR Interval:  162 QRS Duration:  110 QT Interval:  438 QTC Calculation: 395 R Axis:   5  Text Interpretation: Sinus bradycardia T wave abnormality, consider inferior ischemia When compared with ECG of 09-Jul-2024 10:44, Inferior  TWI are more pronounced Confirmed by Michele Richardson [phone]) on 08/06/2024 11:43:31 AM  Echocardiogram: 11/18/2022 LVEF 60 to 65%, normal diastolic function. Right ventricular size and function normal. Moderate LAE. Agitated bubble study negative at rest and with Valsalva. Myxomatous mitral valve, mild to moderate MR Mild aortic valve regurgitation. Aortic root 39 mm, ascending aorta 42 mm.  11/13/2023 LVEF 60 to 65%, no regional wall motion abnormalities. Right ventricular size and function normal. Mildly elevated pulmonary artery systolic pressures, 42 mmHg Severe LAE. Severe posterior leaflet prolapse with anteriorly directed jet, possible flail P2 segment Moderate AI Aortic root 43 mm, ascending aorta 43 mm, Estimated RAP 3 mmHg. Transesophageal echocardiogram recommended  TEE 07/24/2024 LVEF 60 to 65%. Right ventricular size and function normal. Severely dilated left atrium. 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 Severe prolapse and severe mitral  insufficiency only occur in the last 45% of systole. The maximum instantaneous effective regurgitant orifice area is very large at 1.5 cm sq., but the regurgitant volume is 82 ml and the regurgitant fraction is 46%, therefore more consistent with moderate-to-severe mitral insufficiency. There is systolic dominant forward flow in the left pulmonary vein and blunted, but  antegrade systolic flow in the right pulmonary veins. No chordal rupture is identified. The mitral valve is myxomatous.  Moderate to severe mitral valve regurgitation. No evidence of mitral stenosis. There is severe late systolic prolapse of the middle segment of the anterior leaflet of the mitral valve. The mean mitral valve gradient is 2.3 mmHg with average heart rate of 75 bpm.  Tricuspid valve is myxomatous.  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 There is moderate dilatation of the ascending aorta, measuring 43 mm. The aortic arch and descending aorta appear to be normal in caliber.   Conclusion(s)/Recommendation(s): Consider evaluation for connective tissue disorder (e.g. Marfan or Ehler's-Danlos syndrome). Note family history of aortic dissection(brother). Serial imaging of the aorta is indicated. Future measurement of the aortic root should be done with a cardiac gated CT angiography protocol, since the aortic root was not identified as being aneurysmal on the recent non-gated CT without contrast.   RADIOLOGY: CT chest without contrast: 05/13/2022: Mild ascending aorta 4.4 cm in the greatest dimension Main pulmonary artery 3.4 cm in greatest dimension See report for additional details  Risk Assessment/Calculations:   NA   Labs:       Latest Ref Rng & Units 07/09/2024   12:40 PM 10/25/2022   10:02 AM 05/16/2022    6:15 AM  CBC  WBC 3.4 - 10.8 x10E3/uL 6.0  5.6  5.0   Hemoglobin 13.0 - 17.7 g/dL 83.2  82.3  84.3   Hematocrit 37.5 - 51.0 % 50.2  52.7  47.9   Platelets 150 - 450 x10E3/uL 162  184  158        Latest Ref Rng & Units 07/09/2024   12:40  PM 10/25/2022   10:02 AM 05/17/2022    3:40 AM  BMP  Glucose 70 - 99 mg/dL 92  868  883   BUN 8 - 27 mg/dL 13  13  8    Creatinine 0.76 - 1.27 mg/dL 9.08  9.15  9.20   BUN/Creat Ratio 10 - 24 14  15     Sodium 134 - 144 mmol/L 146  143  142   Potassium 3.5 - 5.2 mmol/L 4.2  3.8  4.0   Chloride 96 - 106 mmol/L 102  100  103   CO2 20 - 29 mmol/L 29  29  34   Calcium  8.6 - 10.2 mg/dL 88.7  89.3  9.7       Latest  Ref Rng & Units 07/09/2024   12:40 PM 10/25/2022   10:02 AM 05/17/2022    3:40 AM  CMP  Glucose 70 - 99 mg/dL 92  868  883   BUN 8 - 27 mg/dL 13  13  8    Creatinine 0.76 - 1.27 mg/dL 9.08  9.15  9.20   Sodium 134 - 144 mmol/L 146  143  142   Potassium 3.5 - 5.2 mmol/L 4.2  3.8  4.0   Chloride 96 - 106 mmol/L 102  100  103   CO2 20 - 29 mmol/L 29  29  34   Calcium  8.6 - 10.2 mg/dL 88.7  89.3  9.7   Total Protein 6.0 - 8.5 g/dL  6.4    Total Bilirubin 0.0 - 1.2 mg/dL  1.0    Alkaline Phos 44 - 121 IU/L  71    AST 0 - 40 IU/L  16    ALT 0 - 44 IU/L  17      Lab Results  Component Value Date   CHOL 157 10/25/2022   HDL 39 (L) 10/25/2022   LDLCALC 98 10/25/2022   TRIG 107 10/25/2022   CHOLHDL 4.0 10/25/2022   No results for input(s): LIPOA in the last 8760 hours. No components found for: NTPROBNP No results for input(s): PROBNP in the last 8760 hours. No results for input(s): TSH in the last 8760 hours.  Physical Exam:    Today's Vitals   08/06/24 1055  BP: 118/68  Pulse: (!) 48  Resp: 16  SpO2: 93%  Weight: 217 lb 6.4 oz (98.6 kg)  Height: 6' 1 (1.854 m)   Body mass index is 28.68 kg/m. Wt Readings from Last 3 Encounters:  08/06/24 217 lb 6.4 oz (98.6 kg)  07/24/24 210 lb (95.3 kg)  07/09/24 222 lb 6.4 oz (100.9 kg)    Physical Exam  Constitutional: No distress.  hemodynamically stable  Neck: No JVD present.  Cardiovascular: Normal rate, regular rhythm, S1 normal and S2 normal. Exam reveals no gallop, no S3 and no S4.  Murmur heard. High-pitched blowing holosystolic murmur is present with a grade of 3/6 at the apex. High-pitched decrescendo early diastolic murmur is present at the upper right sternal border radiating to the apex. Pulmonary/Chest: Effort normal and breath sounds normal. No stridor. He has no wheezes. He has no rales.  Musculoskeletal:        General: No edema.     Cervical back: Neck supple.  Skin: Skin is warm.     Impression &  Recommendation(s):  Impression:   ICD-10-CM   1. Mitral valve disease  I05.9     2. MVP (mitral valve prolapse)  I34.1 EKG 12-Lead    Basic Metabolic Panel (BMET)  CBC    CBC    Basic Metabolic Panel (BMET)    CANCELED: Referral to HRT/VAS Care Navigation    3. Pre-procedure lab exam  [phone] Basic Metabolic Panel (BMET)    CBC    CBC    Basic Metabolic Panel (BMET)    4. Angina pectoris  I20.9 Basic Metabolic Panel (BMET)    CBC    CBC    Basic Metabolic Panel (BMET)    aspirin  EC 81 MG tablet    5. Aortic root dilatation  I77.810 CT ANGIO CHEST AORTA W/CM & OR WO/CM    Ambulatory referral to Genetics    losartan (COZAAR) 25 MG tablet    6. Family history of dissection of thoracic aorta  Z82.49 Ambulatory referral to Genetics    7. Essential hypertension  I10 losartan (COZAAR) 25 MG tablet    8. OSA (obstructive sleep apnea)  G47.33     9. Pure hypercholesterolemia  E78.00        Recommendation(s):  Mitral valve disease MVP (mitral valve prolapse) Recent transesophageal echocardiogram notes bileaflet prolapse suggestive of Barlow disease.  Quantitative assessment suggestive of severe mitral regurgitation based on EROA and regurgitant volume.  Moreover, prior transthoracic echo noted LVIDD at 6.28 cm.  Severe left atrial enlargement with blunted flow in the pulmonary vein. Patient has been experiencing shortness of breath with effort related activities and possibly altering his activity level to alleviate symptoms. Given the severity of mitral regurgitation, LV size, left atrial dilatation, and blunted flow in the pulmonary veins, and symptoms are recommended further workup for mitral valve intervention. Patient also endorses chest pain with effort related activities predominantly noticed with exercise.  EKG today notes sinus rhythm with more pronounced TWI in inferior leads. Patient agreeable we will proceed forward left and right heart catheterization.  Angina  pectoris Symptoms suggestive of cardiac discomfort. No active pain. EKG notes TWI in inferior leads suggestive of ischemia. Avoid overexertion Start aspirin  81 mg p.o. daily Continue atorvastatin  40 mg p.o. nightly Will proceed forward with left heart catheterization with possible intervention.  Risks, benefits, alternatives discussed Educated him on seeking medical attention sooner by going to the closest ER via EMS if the symptoms increase in intensity, frequency, duration, or has typical chest pain as discussed in the office.  Patient verbalized understanding.  Aortic root dilatation /ascending aorta dilatation Family history of dissection of thoracic aorta Known history of dilated ascending aorta most recent transthoracic echo in February 2025 noted the aortic root to be 43 mm and ascending aorta to be 43 mm as well. Most recent transesophageal echocardiogram reported aortic root to be 51 mm (suggestive of aneurysmal dilatation) and  the ascending aorta to be 43 mm. Patient also has moderate aortic regurgitation Will do a CTA aorta protocol to reevaluate the dimensions of the ascending aorta and aortic root. Patient reports family history of aortic dissection affecting his brother. Given the myxomatous mitral and tricuspid valve and concerns for aortic root dilatation will refer to ambulatory geneticist for evaluation of connective tissue disorders such as Marfan's/Ehlers-Danlos/etc.  Essential hypertension Office blood pressures are well-controlled. Given the aortic root and ascending aorta findings recommend discontinuation of amlodipine  5 mg p.o. daily and transition to losartan 25 mg p.o. every afternoon.  Recommended he is takes his hydrochlorothiazide  in the morning. Monitor blood pressures at home.  OSA (obstructive sleep apnea) In the past patient was noncompliant with either CPAP or oral dental appliance Prior to establishing care with myself patient  has been evaluated by  advanced heart failure for mild pulmonary hypertension secondary to [facility] class II/III as well.  Pure hypercholesterolemia Continue Lipitor 40 mg p.o. nightly  For both CTA aorta protocol and heart catheterization patient will need to be treated for contrast allergy.   Orders Placed:  Orders Placed This Encounter  Procedures   CT ANGIO CHEST AORTA W/CM & OR WO/CM    Standing Status:   Future    Expiration Date:   08/06/2025    If indicated for the ordered procedure, I authorize the administration of contrast media per Radiology protocol:   Yes    Initiate TAVR/CTA/Valve Evaluation Adult Protocol:   Yes    Does the patient have a contrast media/X-ray dye allergy?:   Yes    [facility]   Basic Metabolic Panel (BMET)    Standing Status:   Future    Number of Occurrences:   1    Expected Date:   08/06/2024    Expiration Date:   08/06/2025    Remote health to draw?:   No   CBC    Standing Status:   Future    Number of Occurrences:   1    Expected Date:   08/06/2024    Expiration Date:   08/06/2025    Remote health to draw?:   No   Ambulatory referral to Genetics    Referral Priority:   Routine    Referral Type:   Consultation    Referral Reason:   Specialty Services Required    Referred to Provider:   Fairy Danford HERO, PhD    Number of Visits Requested:   1   EKG 12-Lead   Final Medication List:    Meds ordered this encounter  Medications   predniSONE (DELTASONE) 50 MG tablet    Sig: Take 1 tablet 13 hours, 7 hours & 1 hour prior to catheterization    Dispense:  3 tablet    Refill:  0   losartan (COZAAR) 25 MG tablet    Sig: Take 1 tablet (25 mg total) by mouth daily.    Dispense:  30 tablet    Refill:  6    Stopping Amlodipine    aspirin  EC 81 MG tablet    Sig: Take 1 tablet (81 mg total) by mouth daily. Swallow whole.    Dispense:  30 tablet    Refill:  12    There are no discontinued medications.   Current Outpatient  Medications:    aspirin  EC 81 MG tablet, Take 1 tablet (81 mg total) by mouth daily. Swallow whole., Disp: 30 tablet, Rfl: 12   losartan (COZAAR) 25 MG tablet, Take 1 tablet (25 mg total) by mouth daily., Disp: 30 tablet, Rfl: 6   predniSONE (DELTASONE) 50 MG tablet, Take 1 tablet 13 hours, 7 hours & 1 hour prior to catheterization, Disp: 3 tablet, Rfl: 0   amLODipine  (NORVASC ) 5 MG tablet, Take 5 mg by mouth daily., Disp: , Rfl:    atorvastatin  (LIPITOR) 40 MG tablet, Take 1 tablet (40 mg total) by mouth daily at 6 PM., Disp: 90 tablet, Rfl: 3   Coenzyme Q10 (CO Q 10) 100 MG CAPS, Take 100 mg by mouth at bedtime., Disp: , Rfl:    hydrochlorothiazide  (HYDRODIURIL ) 25 MG tablet, TAKE 1 TABLET BY MOUTH EVERY DAY, Disp: 90 tablet, Rfl: 1   metoprolol  succinate (TOPROL -XL) 50 MG 24 hr tablet, TAKE 1 TABLET BY MOUTH  DAILY WITH OR IMMEDIATELY FOLLOWING A MEAL, Disp: 90 tablet, Rfl: 1  Consent:    Informed Consent   Shared Decision Making/Informed Consent The risks [stroke (1 in 1000), death (1 in 1000), kidney failure [usually temporary] (1 in 500), bleeding (1 in 200), allergic reaction [possibly serious] (1 in 200)], benefits (diagnostic support and management of coronary artery disease) and alternatives of a left and right heart cardiac catheterization were discussed in detail with Danny Norris and his sister-in-law and they are willing to proceed.     Disposition:   4 to 5 weeks, status post heart catheterization and CT  His questions and concerns were addressed to his satisfaction. He voices understanding of the recommendations provided during this encounter.   I personally spent a total of 47 minutes in the care of the patient today including preparing to see the patient, getting/reviewing separately obtained history, performing a medically appropriate exam/evaluation, counseling and educating, documenting clinical information in the EHR, independently interpreting results, coordinating care, and  Discussed that he should go to the closest ER via EMS if he has worsening chest pain in the interim..   Signed, Madonna Michele HAS, [facility]  A Division of [facility] [address], [place], [place] [address]  08/06/2024 6:55 PM

## 2024-08-06 NOTE — Patient Instructions (Addendum)
 Medication Instructions:  Your physician has recommended you make the following change in your medication:  STOP Amlodipine  START Losartan 25 mg daily at bedtime TAKE your Hydrochlorothiazide  in the morning  *If you need a refill on your cardiac medications before your next appointment, please call your pharmacy*  Lab Work: Today: BMET & CBC  If you have any lab test that is abnormal or we need to change your treatment, we will call you to review the results.  Testing/Procedures: Non-Cardiac CT Angiography (CTA), is a special type of CT scan that uses a computer to produce multi-dimensional views of major blood vessels throughout the body. In CT angiography, a contrast material is injected through an IV to help visualize the blood vessels  Your physician has requested that you have a cardiac catheterization. Cardiac catheterization is used to diagnose and/or treat various heart conditions. Doctors may recommend this procedure for a number of different reasons. The most common reason is to evaluate chest pain. Chest pain can be a symptom of coronary artery disease (CAD), and cardiac catheterization can show whether plaque is narrowing or blocking your heart's arteries. This procedure is also used to evaluate the valves, as well as measure the blood flow and oxygen  levels in different parts of your heart. For further information please visit [email or web address] Please follow instruction sheet below.   Follow-Up: At [facility], you and your health needs are our priority.  As part of our continuing mission to provide you with exceptional heart care, our providers are all part of one team.  This team includes your primary Cardiologist (physician) and Advanced Practice Providers or APPs (Physician Assistants and Nurse Practitioners) who all work together to provide you with the care you need, when you need it.   You have been referred to Dr. Fairy Haring  Your next appointment:   5  week(s)  Provider:   Madonna Large, DO     Thank you for choosing [facility]!!   [phone]   Other Instructions   [place] [facility] A DEPT OF [place]. [facility] AT [address] A DEPT OF THE [place]. [facility] [address] [place] [address] Dept: [phone] Loc: [phone]  Danny Norris  08/06/2024  You are scheduled for a Cardiac Catheterization on Tuesday, November 11 with Dr. Alm Clay.  1. Please arrive at the [facility] (Main Entrance A) at [facility]: [address] [place], [place] [address] at 7:00 AM (This time is 2 hour(s) before your procedure to ensure your preparation).   Free valet parking service is available. You will check in at ADMITTING. The support person will be asked to wait in the waiting room.  It is OK to have someone drop you off and come back when you are ready to be discharged.    Special note: Every effort is made to have your procedure done on time. Please understand that emergencies sometimes delay scheduled procedures.  2. Diet: Nothing to eat after midnight.   3. Hydration: You need to be well hydrated before your procedure. On November 11, you may drink approved liquids (see below) until 2 hours before the procedure, with 16 oz of water as your last intake.   List of approved liquids water, clear juice, clear tea, black coffee, fruit juices, non-citric and without pulp, carbonated beverages, Gatorade, Kool -Aid, plain Jello-O and plain ice popsicles.  4. Labs: You will need to have blood drawn on Tuesday, November  4 at [facility] (1st Floor), [address], [place], [place] [address]. You do not need to be fasting.  5. Medication instructions in preparation for your procedure:   Contrast Allergy: Yes, Please take Prednisone 50mg  by mouth at: Thirteen hours prior to cath 8:00pm on Monday Seven hours prior to cath 2:00am on  Tuesday And prior to leaving home please take last dose of Prednisone 50mg  and Benadryl  50mg  by mouth.  Hold Losartan 24 hours prior, take your last dose on Sunday November 9th  On the morning of your procedure, take your Aspirin  81 mg and any morning medicines NOT listed above.  You may use sips of water.  6. Plan to go home the same day, you will only stay overnight if medically necessary. 7. Bring a current list of your medications and current insurance cards. 8. You MUST have a responsible person to drive you home. 9. Someone MUST be with you the first 24 hours after you arrive home or your discharge will be delayed. 10. Please wear clothes that are easy to get on and off and wear slip-on shoes.  Thank you for allowing us  to care for you!   -- [facility]

## 2024-08-07 LAB — CBC
Hematocrit: 49.9 % (ref 37.5–51.0)
Hemoglobin: 16.3 g/dL (ref 13.0–17.7)
MCH: 32 pg (ref 26.6–33.0)
MCHC: 32.7 g/dL (ref 31.5–35.7)
MCV: 98 fL — ABNORMAL HIGH (ref 79–97)
Platelets: 165 x10E3/uL (ref 150–450)
RBC: 5.09 x10E6/uL (ref 4.14–5.80)
RDW: 12.5 % (ref 11.6–15.4)
WBC: 5.9 x10E3/uL (ref 3.4–10.8)

## 2024-08-07 LAB — BASIC METABOLIC PANEL WITH GFR
BUN/Creatinine Ratio: 14 (ref 10–24)
BUN: 12 mg/dL (ref 8–27)
CO2: 28 mmol/L (ref 20–29)
Calcium: 11.1 mg/dL — AB (ref 8.6–10.2)
Chloride: 100 mmol/L (ref 96–106)
Creatinine, Ser: 0.88 mg/dL (ref 0.76–1.27)
Glucose: 105 mg/dL — AB (ref 70–99)
Potassium: 3.7 mmol/L (ref 3.5–5.2)
Sodium: 142 mmol/L (ref 134–144)
eGFR: 97 mL/min/1.73 (ref >59)

## 2024-08-07 NOTE — Telephone Encounter (Signed)
 Spoke with patient and advised to begin taking 81 mg ASA once daily. Patient acknowledged understanding.

## 2024-08-07 NOTE — Telephone Encounter (Signed)
-----   Message from [facility] sent at 08/06/2024  6:48 PM EST ----- Regarding: Call patient Sherri / Emmanuella Mirante,   Sherri - I forgot to tell you to add ASA 81 mg po qday after seeing the patient. I added it to the note and placed an order. Could you call and update the patient.   Madaline Lefeber,  Just FYI for you as well.  Just connect back with Sherri tomorrow I think she is off for the day - if so please take care of it and respond back to the message if you call the patient that way the loop is closed.   Thanks  Dr. Michele

## 2024-08-09 ENCOUNTER — Telehealth: Payer: Self-pay

## 2024-08-09 NOTE — Telephone Encounter (Signed)
 Spoke with patient, advised to begin 81 mg aspirin  taken once daily. Patient acknowledged understanding.

## 2024-08-11 ENCOUNTER — Ambulatory Visit: Payer: Self-pay | Admitting: Cardiology

## 2024-08-12 ENCOUNTER — Telehealth: Payer: Self-pay | Admitting: *Deleted

## 2024-08-12 NOTE — Telephone Encounter (Signed)
 Cardiac Catheterization scheduled at [facility] for: Tuesday August 13, 2024 9 AM Arrival time [facility] Main Entrance A at: 7 AM  Diet: -Nothing to eat after midnight.  Hydration: -May drink clear liquids until 2 hours before the procedure.  Approved liquids: Water, clear tea, black coffee, fruit juices-non-citric and without pulp,Gatorade, plain Jello/popsicles.   -Please drink 16 oz of water 2 hours before procedure.  CONTRAST ALLERGY: yes-13 hour Prednisone and Benadryl  Prep: 08/12/24 Prednisone 50 mg 8 PM 08/13/24 Prednisone 50 mg 2 AM 08/13/24 Prednisone and Benadryl  50 mg -just prior to leaving home for hospital Do not drive after taking Benadryl   Medication instructions: -Hold:  Hydrochlorothiazide -AM of procedure -Other usual morning medications can be taken including aspirin  81 mg.  Plan to go home the same day, you will only stay overnight if medically necessary.  You must have responsible adult to drive you home.  Someone must be with you the first 24 hours after you arrive home.  Reviewed procedure instructions with patient.

## 2024-08-13 ENCOUNTER — Other Ambulatory Visit: Payer: Self-pay

## 2024-08-13 ENCOUNTER — Ambulatory Visit (HOSPITAL_COMMUNITY)
Admission: RE | Admit: 2024-08-13 | Discharge: 2024-08-13 | Disposition: A | Discharge status: Home / Self Care | Attending: Cardiology | Admitting: Cardiology

## 2024-08-13 ENCOUNTER — Encounter (HOSPITAL_COMMUNITY)
Admission: RE | Disposition: A | Discharge status: Home / Self Care | Payer: Self-pay | Source: Home / Self Care | Attending: Cardiology

## 2024-08-13 DIAGNOSIS — G4733 Obstructive sleep apnea (adult) (pediatric): Secondary | ICD-10-CM | POA: Insufficient documentation

## 2024-08-13 DIAGNOSIS — I10 Essential (primary) hypertension: Secondary | ICD-10-CM | POA: Insufficient documentation

## 2024-08-13 DIAGNOSIS — I272 Pulmonary hypertension, unspecified: Secondary | ICD-10-CM | POA: Diagnosis not present

## 2024-08-13 DIAGNOSIS — Z8249 Family history of ischemic heart disease and other diseases of the circulatory system: Secondary | ICD-10-CM | POA: Insufficient documentation

## 2024-08-13 DIAGNOSIS — I080 Rheumatic disorders of both mitral and aortic valves: Secondary | ICD-10-CM | POA: Insufficient documentation

## 2024-08-13 DIAGNOSIS — Z86718 Personal history of other venous thrombosis and embolism: Secondary | ICD-10-CM | POA: Diagnosis not present

## 2024-08-13 DIAGNOSIS — I2510 Atherosclerotic heart disease of native coronary artery without angina pectoris: Secondary | ICD-10-CM | POA: Diagnosis not present

## 2024-08-13 DIAGNOSIS — I340 Nonrheumatic mitral (valve) insufficiency: Secondary | ICD-10-CM | POA: Diagnosis not present

## 2024-08-13 DIAGNOSIS — E7800 Pure hypercholesterolemia, unspecified: Secondary | ICD-10-CM | POA: Diagnosis not present

## 2024-08-13 DIAGNOSIS — R079 Chest pain, unspecified: Secondary | ICD-10-CM | POA: Diagnosis present

## 2024-08-13 DIAGNOSIS — Z79899 Other long term (current) drug therapy: Secondary | ICD-10-CM | POA: Insufficient documentation

## 2024-08-13 HISTORY — PX: RIGHT/LEFT HEART CATH AND CORONARY ANGIOGRAPHY: CATH118266

## 2024-08-13 LAB — POCT I-STAT 7, (LYTES, BLD GAS, ICA,H+H)
Acid-Base Excess: 1 mmol/L (ref 0.0–2.0)
Bicarbonate: 26.1 mmol/L (ref 20.0–28.0)
Calcium, Ion: 1.4 mmol/L (ref 1.15–1.40)
HCT: 46 % (ref 39.0–52.0)
Hemoglobin: 15.6 g/dL (ref 13.0–17.0)
O2 Saturation: 97 %
Potassium: 3.5 mmol/L (ref 3.5–5.1)
Sodium: 138 mmol/L (ref 135–145)
TCO2: 27 mmol/L (ref 22–32)
pCO2 arterial: 41.5 mmHg (ref 32–48)
pH, Arterial: 7.408 (ref 7.35–7.45)
pO2, Arterial: 94 mmHg (ref 83–108)

## 2024-08-13 LAB — POCT I-STAT EG7
Acid-Base Excess: 2 mmol/L (ref 0.0–2.0)
Acid-Base Excess: 2 mmol/L (ref 0.0–2.0)
Bicarbonate: 27.3 mmol/L (ref 20.0–28.0)
Bicarbonate: 27.7 mmol/L (ref 20.0–28.0)
Calcium, Ion: 1.42 mmol/L — ABNORMAL HIGH (ref 1.15–1.40)
Calcium, Ion: 1.43 mmol/L — ABNORMAL HIGH (ref 1.15–1.40)
HCT: 46 % (ref 39.0–52.0)
HCT: 47 % (ref 39.0–52.0)
Hemoglobin: 15.6 g/dL (ref 13.0–17.0)
Hemoglobin: 16 g/dL (ref 13.0–17.0)
O2 Saturation: 72 %
O2 Saturation: 76 %
Potassium: 3.5 mmol/L (ref 3.5–5.1)
Potassium: 3.5 mmol/L (ref 3.5–5.1)
Sodium: 138 mmol/L (ref 135–145)
Sodium: 139 mmol/L (ref 135–145)
TCO2: 29 mmol/L (ref 22–32)
TCO2: 29 mmol/L (ref 22–32)
pCO2, Ven: 44.6 mmHg (ref 44–60)
pCO2, Ven: 45.6 mmHg (ref 44–60)
pH, Ven: 7.392 (ref 7.25–7.43)
pH, Ven: 7.394 (ref 7.25–7.43)
pO2, Ven: 38 mmHg (ref 32–45)
pO2, Ven: 42 mmHg (ref 32–45)

## 2024-08-13 SURGERY — RIGHT/LEFT HEART CATH AND CORONARY ANGIOGRAPHY
Anesthesia: LOCAL

## 2024-08-13 MED ORDER — FENTANYL CITRATE (PF) 100 MCG/2ML IJ SOLN
INTRAMUSCULAR | Status: AC
  Filled 2024-08-13: qty 2

## 2024-08-13 MED ORDER — SODIUM CHLORIDE 0.9% FLUSH: 3.0000 mL | Freq: Two times a day (BID) | INTRAVENOUS | Status: DC

## 2024-08-13 MED ORDER — LIDOCAINE HCL (PF) 1 % IJ SOLN
INTRAMUSCULAR | Status: DC | PRN
Start: 2024-08-13 — End: 2024-08-13
  Administered 2024-08-13: 5 mL
  Administered 2024-08-13: 2 mL

## 2024-08-13 MED ORDER — ASPIRIN 81 MG PO CHEW: 81.0000 mg | CHEWABLE_TABLET | ORAL | Status: DC

## 2024-08-13 MED ORDER — MIDAZOLAM HCL (PF) 2 MG/2ML IJ SOLN
INTRAMUSCULAR | Status: DC | PRN
Start: 2024-08-13 — End: 2024-08-13
  Administered 2024-08-13: 1 mg via INTRAVENOUS

## 2024-08-13 MED ORDER — IOHEXOL 350 MG/ML SOLN
INTRAVENOUS | Status: DC | PRN
  Administered 2024-08-13: 70 mL

## 2024-08-13 MED ORDER — VERAPAMIL HCL 2.5 MG/ML IV SOLN
INTRAVENOUS | Status: DC | PRN
  Administered 2024-08-13: 10 mL via INTRA_ARTERIAL

## 2024-08-13 MED ORDER — FREE WATER: 500.0000 mL | Freq: Once | Status: DC

## 2024-08-13 MED ORDER — SODIUM CHLORIDE 0.9% FLUSH
3.0000 mL | INTRAVENOUS | Status: DC | PRN
Start: 2024-08-13 — End: 2024-08-13

## 2024-08-13 MED ORDER — LIDOCAINE HCL (PF) 1 % IJ SOLN
INTRAMUSCULAR | Status: AC
  Filled 2024-08-13: qty 30

## 2024-08-13 MED ORDER — FENTANYL CITRATE (PF) 100 MCG/2ML IJ SOLN
INTRAMUSCULAR | Status: DC | PRN
  Administered 2024-08-13: 25 ug via INTRAVENOUS

## 2024-08-13 MED ORDER — SODIUM CHLORIDE 0.9 % IV SOLN: 250.0000 mL | INTRAVENOUS | Status: DC | PRN

## 2024-08-13 MED ORDER — MIDAZOLAM HCL 2 MG/2ML IJ SOLN
INTRAMUSCULAR | Status: AC
  Filled 2024-08-13: qty 2

## 2024-08-13 MED ORDER — VERAPAMIL HCL 2.5 MG/ML IV SOLN
INTRAVENOUS | Status: AC
Start: 2024-08-13 — End: 2024-08-13
  Filled 2024-08-13: qty 2

## 2024-08-13 MED ORDER — HEPARIN SODIUM (PORCINE) 1000 UNIT/ML IJ SOLN
INTRAMUSCULAR | Status: DC | PRN
Start: 2024-08-13 — End: 2024-08-13
  Administered 2024-08-13: 5000 [IU] via INTRAVENOUS

## 2024-08-13 MED ORDER — HEPARIN SODIUM (PORCINE) 1000 UNIT/ML IJ SOLN
INTRAMUSCULAR | Status: AC
  Filled 2024-08-13: qty 10

## 2024-08-13 MED ORDER — HEPARIN (PORCINE) IN NACL 1000-0.9 UT/500ML-% IV SOLN
INTRAVENOUS | Status: DC | PRN
  Administered 2024-08-13 (×2): 500 mL

## 2024-08-13 SURGICAL SUPPLY — 12 items
CATH BALLN WEDGE 5F 110CM (CATHETERS) IMPLANT
CATH INFINITI 5FR JL4 (CATHETERS) IMPLANT
CATH INFINITI AMBI 5FR TG (CATHETERS) IMPLANT
CATH INFINITI JR4 5F (CATHETERS) IMPLANT
DEVICE RAD COMP TR BAND LRG (VASCULAR PRODUCTS) IMPLANT
GLIDESHEATH SLEND SS 6F .021 (SHEATH) IMPLANT
GUIDEWIRE INQWIRE 1.5J.035X260 (WIRE) IMPLANT
PACK CARDIAC CATHETERIZATION (CUSTOM PROCEDURE TRAY) ×2 IMPLANT
SET ATX-X65L (MISCELLANEOUS) IMPLANT
SHEATH GLIDE SLENDER 4/5FR (SHEATH) IMPLANT
SHEATH PROBE COVER 6X72 (BAG) IMPLANT
WIRE MICROINTRODUCER 60CM (WIRE) IMPLANT

## 2024-08-13 NOTE — Interval H&P Note (Signed)
 History and Physical Interval Note:  08/13/2024 9:00 AM  Danny Norris  has presented today for surgery, with the diagnosis of Chest Pain -MR-preop.  The various methods of treatment have been discussed with the patient and family. After consideration of risks, benefits and other options for treatment, the patient has consented to  Procedure(s): RIGHT/LEFT HEART CATH AND CORONARY ANGIOGRAPHY (N/A)  PERCUTANEOUS CORONARY INTERVENTION  as a surgical intervention.  The patient's history has been reviewed, patient examined, no change in status, stable for surgery.  I have reviewed the patient's chart and labs.  Questions were answered to the patient's satisfaction.    Cath Lab Visit (complete for each Cath Lab visit)  Clinical Evaluation Leading to the Procedure:   ACS: No.  Non-ACS:    Anginal Classification: CCS II  Anti-ischemic medical therapy: Minimal Therapy (1 class of medications)  Non-Invasive Test Results: No non-invasive testing performed for coronary valuation, but echocardiogram reveals severe MR  Prior CABG: No previous CABG    Alm Clay

## 2024-08-13 NOTE — Discharge Instructions (Signed)

## 2024-08-13 NOTE — Progress Notes (Signed)
 Discharge instructions reviewed with patient and Rojelio at the bedside. Denies questions or concerns. TR Band removed. No s/s of complication. PT ambulated in the hallway voided in the bathroom. Tolerated PO Intake no complaints of n/v. Pt escorted feromt he unit via wheel cahir to personal vehicle.

## 2024-08-14 ENCOUNTER — Encounter (HOSPITAL_COMMUNITY): Payer: Self-pay | Admitting: Cardiology

## 2024-08-16 ENCOUNTER — Other Ambulatory Visit (HOSPITAL_COMMUNITY): Payer: Self-pay

## 2024-09-06 ENCOUNTER — Other Ambulatory Visit (HOSPITAL_COMMUNITY): Payer: Self-pay | Admitting: *Deleted

## 2024-09-06 MED ORDER — PREDNISONE 50 MG PO TABS: ORAL_TABLET | ORAL | 0 refills | Status: AC

## 2024-09-10 ENCOUNTER — Ambulatory Visit: Admitting: Cardiology

## 2024-09-10 ENCOUNTER — Other Ambulatory Visit (HOSPITAL_COMMUNITY): Payer: Self-pay

## 2024-09-12 ENCOUNTER — Other Ambulatory Visit (HOSPITAL_COMMUNITY)

## 2024-10-07 ENCOUNTER — Telehealth: Payer: Self-pay | Admitting: Cardiology

## 2024-10-07 ENCOUNTER — Ambulatory Visit (HOSPITAL_COMMUNITY): Admission: RE | Admit: 2024-10-07 | Source: Ambulatory Visit

## 2024-10-07 NOTE — Telephone Encounter (Signed)
 Left message for Danny Norris to callback.  In message informed her patient will need to follow protocol for contrast allergy: Prednisone  50 mg at 13 hours (1:15 AM), 7 hours (7:15 AM) and 1 hour (1:15 PM) along with 50 mg of Benadryl  prior to CT scheduled for today at 2:15 PM.  If this has been followed, OK to proceed as scheduled. If patient has not taken premedication will need to reschedule CT.

## 2024-10-07 NOTE — Telephone Encounter (Signed)
 Patient came in for CT.  Patient told we are out of network with his insurance.  Patient wanted to cancel and would like to have order sent to [facility].  Patient would like a call to let him know order has been sent

## 2024-10-07 NOTE — Telephone Encounter (Signed)
 Sister Stoney) wants a call back regarding what patient needs to do prior to his CT Angio test

## 2024-10-08 ENCOUNTER — Telehealth: Payer: Self-pay | Admitting: Cardiology

## 2024-10-08 DIAGNOSIS — I340 Nonrheumatic mitral (valve) insufficiency: Secondary | ICD-10-CM

## 2024-10-08 NOTE — Telephone Encounter (Signed)
 Patient received new insurance for the 2026 year and it Out of Network with [employer].  Spoke with CMA about the CT scan being done at [facility] per patient and patient sister's wishes.  After speaking with Damien Maid and Harman Alderman I reached out to the patient to see if they just wanted to have CT done at [facility] or establish care with [facility].  Patient sister stated they would like to establish care with [facility] since it will be in-network.

## 2024-10-08 NOTE — Progress Notes (Signed)
 Referred patient to [facility] due to [facility] being out of network with patients ne insurance carrier

## 2024-10-09 NOTE — Telephone Encounter (Signed)
 Referral placed to [facility] per order from Dr. Michele.

## 2024-10-11 NOTE — Telephone Encounter (Signed)
 Patient transitioning care to [facility] per insurance requirements.

## 2024-10-14 ENCOUNTER — Other Ambulatory Visit (HOSPITAL_COMMUNITY): Payer: Self-pay

## 2024-10-18 ENCOUNTER — Ambulatory Visit: Payer: Self-pay | Admitting: Cardiology
